# Patient Record
Sex: Male | Born: 1940
Health system: Southern US, Community
[De-identification: ages and names within clinical notes are randomized; demographics above are authoritative.]

## PROBLEM LIST (undated history)

## (undated) DIAGNOSIS — R0600 Dyspnea, unspecified: Secondary | ICD-10-CM

## (undated) DIAGNOSIS — I2510 Atherosclerotic heart disease of native coronary artery without angina pectoris: Secondary | ICD-10-CM

## (undated) DIAGNOSIS — I639 Cerebral infarction, unspecified: Secondary | ICD-10-CM

## (undated) DIAGNOSIS — G4700 Insomnia, unspecified: Secondary | ICD-10-CM

## (undated) DIAGNOSIS — F0390 Unspecified dementia without behavioral disturbance: Secondary | ICD-10-CM

## (undated) DIAGNOSIS — F1010 Alcohol abuse, uncomplicated: Secondary | ICD-10-CM

## (undated) DIAGNOSIS — B019 Varicella without complication: Secondary | ICD-10-CM

## (undated) DIAGNOSIS — I10 Essential (primary) hypertension: Secondary | ICD-10-CM

## (undated) DIAGNOSIS — I480 Paroxysmal atrial fibrillation: Secondary | ICD-10-CM

## (undated) DIAGNOSIS — K922 Gastrointestinal hemorrhage, unspecified: Secondary | ICD-10-CM

## (undated) DIAGNOSIS — D649 Anemia, unspecified: Secondary | ICD-10-CM

## (undated) DIAGNOSIS — I509 Heart failure, unspecified: Secondary | ICD-10-CM

## (undated) DIAGNOSIS — J189 Pneumonia, unspecified organism: Secondary | ICD-10-CM

## (undated) DIAGNOSIS — N189 Chronic kidney disease, unspecified: Secondary | ICD-10-CM

## (undated) DIAGNOSIS — N186 End stage renal disease: Secondary | ICD-10-CM

## (undated) DIAGNOSIS — M1990 Unspecified osteoarthritis, unspecified site: Secondary | ICD-10-CM

## (undated) HISTORY — DX: Cerebral infarction, unspecified: I63.9

## (undated) HISTORY — DX: Alcohol abuse, uncomplicated: F10.10

## (undated) HISTORY — DX: Chronic kidney disease, unspecified: N18.9

## (undated) HISTORY — DX: Essential (primary) hypertension: I10

## (undated) HISTORY — DX: Varicella without complication: B01.9

## (undated) HISTORY — DX: Insomnia, unspecified: G47.00

## (undated) HISTORY — DX: Unspecified osteoarthritis, unspecified site: M19.90

---

## 2019-09-23 DIAGNOSIS — F1010 Alcohol abuse, uncomplicated: Secondary | ICD-10-CM

## 2019-09-23 HISTORY — DX: Alcohol abuse, uncomplicated: F10.10

## 2019-12-15 ENCOUNTER — Encounter: Payer: Self-pay | Admitting: Internal Medicine

## 2019-12-15 ENCOUNTER — Ambulatory Visit (INDEPENDENT_AMBULATORY_CARE_PROVIDER_SITE_OTHER): Payer: Medicare Other | Admitting: Internal Medicine

## 2019-12-15 ENCOUNTER — Other Ambulatory Visit: Payer: Self-pay

## 2019-12-15 VITALS — BP 110/78 | HR 82 | Temp 97.6°F | Ht 68.0 in | Wt 144.7 lb

## 2019-12-15 DIAGNOSIS — I10 Essential (primary) hypertension: Secondary | ICD-10-CM | POA: Insufficient documentation

## 2019-12-15 DIAGNOSIS — N186 End stage renal disease: Secondary | ICD-10-CM

## 2019-12-15 DIAGNOSIS — G4700 Insomnia, unspecified: Secondary | ICD-10-CM

## 2019-12-15 DIAGNOSIS — Z992 Dependence on renal dialysis: Secondary | ICD-10-CM

## 2019-12-15 DIAGNOSIS — Z720 Tobacco use: Secondary | ICD-10-CM

## 2019-12-15 MED ORDER — TRAZODONE HCL 50 MG PO TABS: ORAL_TABLET | ORAL | 1 refills | Status: DC

## 2019-12-15 NOTE — Progress Notes (Signed)
New Patient Office Visit     This visit occurred during the SARS-CoV-2 public health emergency.  Safety protocols were in place, including screening questions prior to the visit, additional usage of staff PPE, and extensive cleaning of exam room while observing appropriate contact time as indicated for disinfecting solutions.    CC/Reason for Visit: Establish care, discuss chronic conditions, medication refills Previous PCP: In [facility] Last Visit: Unknown  HPI: Anthony [name] is a 79 y.o. male who is coming in today for the above mentioned reasons. Past Medical History is significant for: End-stage renal disease on hemodialysis on a Monday, Wednesday, Friday schedule, history of hypertension that has been well controlled as well as a prior CVA in 1992 without residual deficits.  He moved to [place] 2 months ago to be closer to family.  He is already established with the dialysis center recommended a PCP.  He also has a history of insomnia for which she takes tramadol.  He has not had a physical in recent times.  He has no acute complaints today.  He received his first Covid vaccine.  He smokes 1 pack per 3 days.  Denies alcohol use.  He has no known drug allergies.   Past Medical/Surgical History: Past Medical History:  Diagnosis Date  . Alcohol abuse   . Arthritis   . Chicken pox   . Chronic kidney disease   . Hypertension   . Insomnia   . Stroke [facility])     History reviewed. No pertinent surgical history.  Social History:  reports that he has been smoking. He has never used smokeless tobacco. He reports previous alcohol use. He reports previous drug use.  Allergies: Allergies  Allergen Reactions  . Other Rash    Family History:  History reviewed. No pertinent family history. No history of heart disease, cancer, stroke that he is aware of.  Current Outpatient Medications:  .  albuterol (PROVENTIL HFA) 108 (90 Base) MCG/ACT inhaler, Inhale into the  lungs., Disp: , Rfl:  .  albuterol (VENTOLIN HFA) 108 (90 Base) MCG/ACT inhaler, , Disp: , Rfl:  .  hydrALAZINE (APRESOLINE) 100 MG tablet, Take 100 mg by mouth 3 (three) times daily., Disp: , Rfl:  .  OVER THE COUNTER MEDICATION, OMEN XL - for the whole body, Disp: , Rfl:  .  traZODone (DESYREL) 50 MG tablet, Take one to two tabs at bedtime as needed, Disp: 45 tablet, Rfl: 1  Review of Systems:  Constitutional: Denies fever, chills, diaphoresis, appetite change and fatigue.  HEENT: Denies photophobia, eye pain, redness, hearing loss, ear pain, congestion, sore throat, rhinorrhea, sneezing, mouth sores, trouble swallowing, neck pain, neck stiffness and tinnitus.   Respiratory: Denies SOB, DOE, cough, chest tightness,  and wheezing.   Cardiovascular: Denies chest pain, palpitations and leg swelling.  Gastrointestinal: Denies nausea, vomiting, abdominal pain, diarrhea, constipation, blood in stool and abdominal distention.  Genitourinary: Denies dysuria, urgency, frequency, hematuria, flank pain and difficulty urinating.  Endocrine: Denies: hot or cold intolerance, sweats, changes in hair or nails, polyuria, polydipsia. Musculoskeletal: Denies myalgias, back pain, joint swelling, arthralgias and gait problem.  Skin: Denies pallor, rash and wound.  Neurological: Denies dizziness, seizures, syncope, weakness, light-headedness, numbness and headaches.  Hematological: Denies adenopathy. Easy bruising, personal or family bleeding history  Psychiatric/Behavioral: Denies suicidal ideation, mood changes, confusion, nervousness, sleep disturbance and agitation    Physical Exam: Vitals:   12/15/19 1416  BP: 110/78  Pulse: 82  Temp: 97.6 F (36.4 C)  TempSrc: Temporal  SpO2: 97%  Weight: 144 lb 11.2 oz (65.6 kg)  Height: 5\' 8"  (1.727 m)   Body mass index is 22 kg/m.  Constitutional: NAD, calm, comfortable Eyes: PERRL, lids and conjunctivae normal ENMT: Mucous membranes are  moist. Respiratory: clear to auscultation bilaterally, no wheezing, no crackles. Normal respiratory effort. No accessory muscle use.  Cardiovascular: Regular rate and rhythm, no murmurs / rubs / gallops. No extremity edema.  Neurologic: Grossly intact and nonfocal  Psychiatric: Normal judgment and insight. Alert and oriented x 3. Normal mood.    Impression and Plan:  Tobacco abuse -Not interested in quitting at this time.  Insomnia, unspecified type  -Refill trazodone 50 mg that he has been taking use at bedtime.  ESRD (end stage renal disease) on dialysis [facility]) -On Monday, Wednesday, Friday schedule.  Essential hypertension -Well-controlled.    Patient Instructions  -Nice seeing you today!!  -See you back in 3 months for your physical. Please come in fasting that day.     Lelon Frohlich, MD [facility]

## 2019-12-15 NOTE — Patient Instructions (Signed)
-  Nice seeing you today!!  -See you back in 3 months for your physical. Please come in fasting that day.

## 2019-12-21 ENCOUNTER — Other Ambulatory Visit: Payer: Self-pay

## 2019-12-21 ENCOUNTER — Emergency Department (HOSPITAL_COMMUNITY)
Admission: EM | Admit: 2019-12-21 | Discharge: 2019-12-21 | Disposition: A | Discharge status: Home / Self Care | Payer: Medicare Other | Attending: Emergency Medicine | Admitting: Emergency Medicine

## 2019-12-21 ENCOUNTER — Emergency Department (HOSPITAL_COMMUNITY): Payer: Medicare Other

## 2019-12-21 ENCOUNTER — Encounter (HOSPITAL_COMMUNITY): Payer: Self-pay

## 2019-12-21 DIAGNOSIS — S199XXA Unspecified injury of neck, initial encounter: Secondary | ICD-10-CM | POA: Diagnosis present

## 2019-12-21 DIAGNOSIS — Z992 Dependence on renal dialysis: Secondary | ICD-10-CM | POA: Diagnosis not present

## 2019-12-21 DIAGNOSIS — F17200 Nicotine dependence, unspecified, uncomplicated: Secondary | ICD-10-CM | POA: Diagnosis not present

## 2019-12-21 DIAGNOSIS — Z8673 Personal history of transient ischemic attack (TIA), and cerebral infarction without residual deficits: Secondary | ICD-10-CM | POA: Diagnosis not present

## 2019-12-21 DIAGNOSIS — S161XXA Strain of muscle, fascia and tendon at neck level, initial encounter: Secondary | ICD-10-CM | POA: Insufficient documentation

## 2019-12-21 DIAGNOSIS — Y92410 Unspecified street and highway as the place of occurrence of the external cause: Secondary | ICD-10-CM | POA: Diagnosis not present

## 2019-12-21 DIAGNOSIS — Y939 Activity, unspecified: Secondary | ICD-10-CM | POA: Diagnosis not present

## 2019-12-21 DIAGNOSIS — N186 End stage renal disease: Secondary | ICD-10-CM | POA: Diagnosis not present

## 2019-12-21 DIAGNOSIS — Y999 Unspecified external cause status: Secondary | ICD-10-CM | POA: Diagnosis not present

## 2019-12-21 DIAGNOSIS — I120 Hypertensive chronic kidney disease with stage 5 chronic kidney disease or end stage renal disease: Secondary | ICD-10-CM | POA: Insufficient documentation

## 2019-12-21 MED ORDER — OXYCODONE-ACETAMINOPHEN 5-325 MG PO TABS
1.0000 | ORAL_TABLET | Freq: Once | ORAL | Status: AC
  Administered 2019-12-21: 16:00:00 1 via ORAL
  Filled 2019-12-21: qty 1

## 2019-12-21 MED ORDER — METHOCARBAMOL 500 MG PO TABS
500.0000 mg | ORAL_TABLET | Freq: Once | ORAL | Status: AC
  Administered 2019-12-21: 500 mg via ORAL
  Filled 2019-12-21: qty 1

## 2019-12-21 MED ORDER — METHOCARBAMOL 500 MG PO TABS: 500.0000 mg | ORAL_TABLET | Freq: Three times a day (TID) | ORAL | 0 refills | Status: DC | PRN
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
## 2019-12-21 NOTE — Discharge Instructions (Signed)
Please schedule a follow-up plan with your primary doctor for recheck regarding the symptoms returned today.  Can take muscle relaxer as needed for pain control as well as Tylenol.  Return to ER if you develop chest pain, difficulty breathing, numbness, weakness, headaches or other new concern.

## 2020-01-26 ENCOUNTER — Other Ambulatory Visit: Payer: Self-pay | Admitting: Internal Medicine

## 2020-01-26 DIAGNOSIS — G4700 Insomnia, unspecified: Secondary | ICD-10-CM

## 2020-02-14 ENCOUNTER — Ambulatory Visit
Admission: RE | Admit: 2020-02-14 | Discharge: 2020-02-14 | Disposition: A | Discharge status: Home / Self Care | Payer: Medicare Other | Source: Ambulatory Visit | Attending: Nephrology | Admitting: Nephrology

## 2020-02-14 ENCOUNTER — Other Ambulatory Visit: Payer: Self-pay | Admitting: Nephrology

## 2020-02-14 DIAGNOSIS — N186 End stage renal disease: Secondary | ICD-10-CM

## 2020-02-14 DIAGNOSIS — R0600 Dyspnea, unspecified: Secondary | ICD-10-CM

## 2020-02-21 ENCOUNTER — Telehealth: Payer: Self-pay | Admitting: Internal Medicine

## 2020-02-29 ENCOUNTER — Encounter (HOSPITAL_COMMUNITY): Payer: Self-pay | Admitting: Pharmacy Technician

## 2020-02-29 ENCOUNTER — Inpatient Hospital Stay (HOSPITAL_COMMUNITY)
Admission: EM | Admit: 2020-02-29 | Discharge: 2020-03-02 | DRG: 190 | Disposition: A | Discharge status: Home / Self Care | Payer: Medicare Other | Source: Other Acute Inpatient Hospital | Attending: Family Medicine | Admitting: Family Medicine

## 2020-02-29 ENCOUNTER — Other Ambulatory Visit: Payer: Self-pay

## 2020-02-29 ENCOUNTER — Emergency Department (HOSPITAL_COMMUNITY): Payer: Medicare Other

## 2020-02-29 DIAGNOSIS — K219 Gastro-esophageal reflux disease without esophagitis: Secondary | ICD-10-CM | POA: Diagnosis present

## 2020-02-29 DIAGNOSIS — D638 Anemia in other chronic diseases classified elsewhere: Secondary | ICD-10-CM | POA: Diagnosis present

## 2020-02-29 DIAGNOSIS — N2581 Secondary hyperparathyroidism of renal origin: Secondary | ICD-10-CM | POA: Diagnosis present

## 2020-02-29 DIAGNOSIS — F17210 Nicotine dependence, cigarettes, uncomplicated: Secondary | ICD-10-CM | POA: Diagnosis present

## 2020-02-29 DIAGNOSIS — I132 Hypertensive heart and chronic kidney disease with heart failure and with stage 5 chronic kidney disease, or end stage renal disease: Secondary | ICD-10-CM | POA: Diagnosis present

## 2020-02-29 DIAGNOSIS — Z20822 Contact with and (suspected) exposure to covid-19: Secondary | ICD-10-CM | POA: Diagnosis present

## 2020-02-29 DIAGNOSIS — E8889 Other specified metabolic disorders: Secondary | ICD-10-CM | POA: Diagnosis present

## 2020-02-29 DIAGNOSIS — N186 End stage renal disease: Secondary | ICD-10-CM | POA: Diagnosis present

## 2020-02-29 DIAGNOSIS — Z8249 Family history of ischemic heart disease and other diseases of the circulatory system: Secondary | ICD-10-CM

## 2020-02-29 DIAGNOSIS — J209 Acute bronchitis, unspecified: Secondary | ICD-10-CM | POA: Diagnosis present

## 2020-02-29 DIAGNOSIS — R079 Chest pain, unspecified: Secondary | ICD-10-CM | POA: Diagnosis not present

## 2020-02-29 DIAGNOSIS — I480 Paroxysmal atrial fibrillation: Secondary | ICD-10-CM | POA: Diagnosis present

## 2020-02-29 DIAGNOSIS — Z8673 Personal history of transient ischemic attack (TIA), and cerebral infarction without residual deficits: Secondary | ICD-10-CM

## 2020-02-29 DIAGNOSIS — F17200 Nicotine dependence, unspecified, uncomplicated: Secondary | ICD-10-CM | POA: Diagnosis present

## 2020-02-29 DIAGNOSIS — R778 Other specified abnormalities of plasma proteins: Secondary | ICD-10-CM

## 2020-02-29 DIAGNOSIS — D72819 Decreased white blood cell count, unspecified: Secondary | ICD-10-CM | POA: Diagnosis present

## 2020-02-29 DIAGNOSIS — D649 Anemia, unspecified: Secondary | ICD-10-CM | POA: Diagnosis present

## 2020-02-29 DIAGNOSIS — J440 Chronic obstructive pulmonary disease with acute lower respiratory infection: Secondary | ICD-10-CM | POA: Diagnosis present

## 2020-02-29 DIAGNOSIS — Z992 Dependence on renal dialysis: Secondary | ICD-10-CM

## 2020-02-29 DIAGNOSIS — Z79899 Other long term (current) drug therapy: Secondary | ICD-10-CM | POA: Diagnosis not present

## 2020-02-29 DIAGNOSIS — I248 Other forms of acute ischemic heart disease: Secondary | ICD-10-CM | POA: Diagnosis present

## 2020-02-29 DIAGNOSIS — J441 Chronic obstructive pulmonary disease with (acute) exacerbation: Principal | ICD-10-CM | POA: Diagnosis present

## 2020-02-29 DIAGNOSIS — I5032 Chronic diastolic (congestive) heart failure: Secondary | ICD-10-CM | POA: Diagnosis present

## 2020-02-29 DIAGNOSIS — F1011 Alcohol abuse, in remission: Secondary | ICD-10-CM | POA: Diagnosis present

## 2020-02-29 DIAGNOSIS — I160 Hypertensive urgency: Secondary | ICD-10-CM | POA: Diagnosis present

## 2020-02-29 DIAGNOSIS — J9601 Acute respiratory failure with hypoxia: Secondary | ICD-10-CM | POA: Diagnosis present

## 2020-02-29 DIAGNOSIS — R0789 Other chest pain: Secondary | ICD-10-CM

## 2020-02-29 DIAGNOSIS — D631 Anemia in chronic kidney disease: Secondary | ICD-10-CM | POA: Diagnosis present

## 2020-02-29 HISTORY — DX: Gastrointestinal hemorrhage, unspecified: K92.2

## 2020-02-29 HISTORY — DX: Dependence on renal dialysis: N18.6

## 2020-02-29 HISTORY — DX: Heart failure, unspecified: I50.9

## 2020-02-29 LAB — CBC WITH DIFFERENTIAL/PLATELET
Abs Immature Granulocytes: 0.02 10*3/uL (ref 0.00–0.07)
Basophils Absolute: 0 10*3/uL (ref 0.0–0.1)
Basophils Relative: 1 %
Eosinophils Absolute: 0 10*3/uL (ref 0.0–0.5)
Eosinophils Relative: 0 %
HCT: 28.1 % — ABNORMAL LOW (ref 39.0–52.0)
Hemoglobin: 8.3 g/dL — ABNORMAL LOW (ref 13.0–17.0)
Immature Granulocytes: 0 %
Lymphocytes Relative: 19 %
Lymphs Abs: 0.9 10*3/uL (ref 0.7–4.0)
MCH: 27.7 pg (ref 26.0–34.0)
MCHC: 29.5 g/dL — ABNORMAL LOW (ref 30.0–36.0)
MCV: 93.7 fL (ref 80.0–100.0)
Monocytes Absolute: 0.4 10*3/uL (ref 0.1–1.0)
Monocytes Relative: 9 %
Neutro Abs: 3.4 10*3/uL (ref 1.7–7.7)
Neutrophils Relative %: 71 %
Platelets: 205 10*3/uL (ref 150–400)
RBC: 3 MIL/uL — ABNORMAL LOW (ref 4.22–5.81)
RDW: 17.2 % — ABNORMAL HIGH (ref 11.5–15.5)
WBC: 4.8 10*3/uL (ref 4.0–10.5)
nRBC: 0 % (ref 0.0–0.2)

## 2020-02-29 LAB — COMPREHENSIVE METABOLIC PANEL
ALT: 20 U/L (ref 0–44)
AST: 25 U/L (ref 15–41)
Albumin: 3.5 g/dL (ref 3.5–5.0)
Alkaline Phosphatase: 74 U/L (ref 38–126)
Anion gap: 16 — ABNORMAL HIGH (ref 5–15)
BUN: 26 mg/dL — ABNORMAL HIGH (ref 8–23)
CO2: 25 mmol/L (ref 22–32)
Calcium: 8.3 mg/dL — ABNORMAL LOW (ref 8.9–10.3)
Chloride: 99 mmol/L (ref 98–111)
Creatinine, Ser: 7.92 mg/dL — ABNORMAL HIGH (ref 0.61–1.24)
GFR calc Af Amer: 7 mL/min — ABNORMAL LOW (ref >60)
GFR calc non Af Amer: 6 mL/min — ABNORMAL LOW (ref >60)
Glucose, Bld: 101 mg/dL — ABNORMAL HIGH (ref 70–99)
Potassium: 3.7 mmol/L (ref 3.5–5.1)
Sodium: 140 mmol/L (ref 135–145)
Total Bilirubin: 0.8 mg/dL (ref 0.3–1.2)
Total Protein: 7.3 g/dL (ref 6.5–8.1)

## 2020-02-29 LAB — CREATININE, SERUM
Creatinine, Ser: 8.56 mg/dL — ABNORMAL HIGH (ref 0.61–1.24)
GFR calc Af Amer: 6 mL/min — ABNORMAL LOW (ref >60)
GFR calc non Af Amer: 5 mL/min — ABNORMAL LOW (ref >60)

## 2020-02-29 LAB — PHOSPHORUS: Phosphorus: 4.1 mg/dL (ref 2.5–4.6)

## 2020-02-29 LAB — TROPONIN I (HIGH SENSITIVITY)
Troponin I (High Sensitivity): 326 ng/L (ref <18)
Troponin I (High Sensitivity): 310 ng/L (ref <18)

## 2020-02-29 LAB — BRAIN NATRIURETIC PEPTIDE: B Natriuretic Peptide: >4500 pg/mL — ABNORMAL HIGH (ref 0.0–100.0)

## 2020-02-29 LAB — SARS CORONAVIRUS 2 BY RT PCR (HOSPITAL ORDER, PERFORMED IN ~~LOC~~ HOSPITAL LAB): SARS Coronavirus 2: NEGATIVE

## 2020-02-29 LAB — MAGNESIUM: Magnesium: 2.2 mg/dL (ref 1.7–2.4)

## 2020-02-29 MED ORDER — METHYLPREDNISOLONE SODIUM SUCC 125 MG IJ SOLR
125.0000 mg | Freq: Once | INTRAMUSCULAR | Status: AC
  Administered 2020-02-29: 125 mg via INTRAVENOUS
  Filled 2020-02-29: qty 2

## 2020-02-29 MED ORDER — ALBUTEROL SULFATE HFA 108 (90 BASE) MCG/ACT IN AERS
INHALATION_SPRAY | RESPIRATORY_TRACT | Status: AC
  Filled 2020-02-29: qty 6.7

## 2020-02-29 MED ORDER — PANTOPRAZOLE SODIUM 40 MG PO TBEC: 40.0000 mg | DELAYED_RELEASE_TABLET | Freq: Every day | ORAL | Status: DC

## 2020-02-29 MED ORDER — IPRATROPIUM-ALBUTEROL 0.5-2.5 (3) MG/3ML IN SOLN
3.0000 mL | RESPIRATORY_TRACT | Status: DC | PRN
  Administered 2020-02-29 – 2020-03-01 (×2): 3 mL via RESPIRATORY_TRACT
  Filled 2020-02-29 (×2): qty 3

## 2020-02-29 MED ORDER — ASPIRIN 81 MG PO CHEW
324.0000 mg | CHEWABLE_TABLET | Freq: Once | ORAL | Status: AC
  Administered 2020-02-29: 324 mg via ORAL
  Filled 2020-02-29: qty 4

## 2020-02-29 MED ORDER — ALBUTEROL SULFATE HFA 108 (90 BASE) MCG/ACT IN AERS
8.0000 | INHALATION_SPRAY | Freq: Once | RESPIRATORY_TRACT | Status: AC
  Administered 2020-02-29: 8 via RESPIRATORY_TRACT

## 2020-02-29 MED ORDER — HEPARIN SODIUM (PORCINE) 5000 UNIT/ML IJ SOLN
5000.0000 [IU] | Freq: Three times a day (TID) | INTRAMUSCULAR | Status: DC
  Administered 2020-03-01: 5000 [IU] via SUBCUTANEOUS
  Filled 2020-02-29: qty 1

## 2020-02-29 MED ORDER — CALCIUM ACETATE (PHOS BINDER) 667 MG PO CAPS
667.0000 mg | ORAL_CAPSULE | Freq: Three times a day (TID) | ORAL | Status: DC
  Administered 2020-03-01: 667 mg via ORAL
  Filled 2020-02-29 (×2): qty 1

## 2020-02-29 MED ORDER — LABETALOL HCL 5 MG/ML IV SOLN
10.0000 mg | INTRAVENOUS | Status: DC | PRN
  Administered 2020-02-29 – 2020-03-01 (×2): 10 mg via INTRAVENOUS
  Filled 2020-02-29 (×3): qty 4

## 2020-02-29 MED ORDER — TRAZODONE HCL 50 MG PO TABS
50.0000 mg | ORAL_TABLET | Freq: Every day | ORAL | Status: DC
  Administered 2020-02-29 – 2020-03-01 (×2): 50 mg via ORAL
  Filled 2020-02-29 (×2): qty 1

## 2020-02-29 MED ORDER — ALBUTEROL SULFATE (2.5 MG/3ML) 0.083% IN NEBU: 2.5000 mg | INHALATION_SOLUTION | RESPIRATORY_TRACT | Status: DC

## 2020-02-29 MED ORDER — MORPHINE SULFATE (PF) 2 MG/ML IV SOLN
2.0000 mg | INTRAVENOUS | Status: DC | PRN
  Filled 2020-02-29: qty 1

## 2020-02-29 MED ORDER — HYDRALAZINE HCL 25 MG PO TABS
100.0000 mg | ORAL_TABLET | Freq: Three times a day (TID) | ORAL | Status: DC
  Administered 2020-02-29 – 2020-03-02 (×5): 100 mg via ORAL
  Filled 2020-02-29 (×6): qty 4

## 2020-02-29 MED ORDER — ACETAMINOPHEN 650 MG RE SUPP: 650.0000 mg | Freq: Four times a day (QID) | RECTAL | Status: DC | PRN

## 2020-02-29 MED ORDER — ONDANSETRON HCL 4 MG PO TABS: 4.0000 mg | ORAL_TABLET | Freq: Four times a day (QID) | ORAL | Status: DC | PRN

## 2020-02-29 MED ORDER — IPRATROPIUM-ALBUTEROL 0.5-2.5 (3) MG/3ML IN SOLN
3.0000 mL | RESPIRATORY_TRACT | Status: DC
  Administered 2020-02-29: 3 mL via RESPIRATORY_TRACT
  Filled 2020-02-29: qty 3

## 2020-02-29 MED ORDER — ONDANSETRON HCL 4 MG/2ML IJ SOLN
4.0000 mg | Freq: Four times a day (QID) | INTRAMUSCULAR | Status: DC | PRN
  Filled 2020-02-29: qty 2

## 2020-02-29 MED ORDER — NITROGLYCERIN 0.4 MG SL SUBL: 0.4000 mg | SUBLINGUAL_TABLET | SUBLINGUAL | Status: DC | PRN

## 2020-02-29 MED ORDER — METHYLPREDNISOLONE SODIUM SUCC 125 MG IJ SOLR
60.0000 mg | Freq: Two times a day (BID) | INTRAMUSCULAR | Status: DC
  Administered 2020-03-01: 60 mg via INTRAVENOUS
  Filled 2020-02-29: qty 2

## 2020-02-29 MED ORDER — ACETAMINOPHEN 325 MG PO TABS: 650.0000 mg | ORAL_TABLET | Freq: Four times a day (QID) | ORAL | Status: DC | PRN

## 2020-02-29 NOTE — ED Notes (Signed)
Pt provided w/ water

## 2020-02-29 NOTE — ED Triage Notes (Signed)
Pt bib ems from dialysis after completing 1/4 treatment when he developed CP. Pt with noticeable trouble breathing. Given 5mg  albuterol, 125 solu medrol and placed on Cpap 190/110 NSR 80
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
## 2020-02-29 NOTE — ED Notes (Signed)
Attempted to call report; nurse in a patient's room and will call me back.

## 2020-02-29 NOTE — ED Provider Notes (Signed)
[facility] EMERGENCY DEPARTMENT Provider Note   CSN: [id number] Arrival date & time: 02/29/20  1400     History Chief Complaint  Patient presents with  . Chest Pain  . Shortness of Breath    Anthony [name] is a 79 y.o. male.  HPI 79 year old male presents from dialysis with shortness of breath.  History of by EMS and patient.  Patient tells me he is chronically short of breath when he walks but this morning it was worse.  He is also having a cough that is new this morning.  Went to dialysis and had chest pain after starting dialysis so he was sent here.  Chest pain seems to be gone.  He describes the feeling he had as "medium". EMS placed him on CPAP and given albuterol and Solu-Medrol.  He states he is feeling much better but not back to his baseline.  He denies any known lung history such as COPD.  He does smoke.  No leg swelling or previous missed dialysis. No fevers.   Past Medical History:  Diagnosis Date  . Alcohol abuse   . Arthritis   . Chicken pox   . Chronic kidney disease   . Hypertension   . Insomnia   . Stroke [facility])     Patient Active Problem List   Diagnosis Date Noted  . ESRD (end stage renal disease) on dialysis ([place]) 12/15/2019  . HTN (hypertension) 12/15/2019  . Insomnia     History reviewed. No pertinent surgical history.     No family history on file.  Social History   Tobacco Use  . Smoking status: Current Every Day Smoker  . Smokeless tobacco: Never Used  Substance Use Topics  . Alcohol use: Not Currently  . Drug use: Not Currently    Home Medications Prior to Admission medications   Medication Sig Start Date End Date Taking? Authorizing Provider  albuterol (PROVENTIL HFA) 108 (90 Base) MCG/ACT inhaler Inhale into the lungs. 10/15/17   [provider]  albuterol (VENTOLIN HFA) 108 (90 Base) MCG/ACT inhaler  09/02/19   [provider]  hydrALAZINE (APRESOLINE) 100 MG tablet Take 100 mg by mouth 3  (three) times daily. 07/31/19   [provider]  methocarbamol (ROBAXIN) 500 MG tablet Take 1 tablet (500 mg total) by mouth every 8 (eight) hours as needed for muscle spasms. 12/21/19   Lucrezia Starch, MD  OVER THE COUNTER MEDICATION OMEN XL - for the whole body    [provider]  traZODone (DESYREL) 50 MG tablet TAKE 1 TO 2 TABLETS AT BEDTIME AS NEEDED 01/26/20   Isaac Bliss, Rayford Halsted, MD    Allergies    Other  Review of Systems   Review of Systems  Constitutional: Negative for fever.  Respiratory: Positive for cough and shortness of breath.   Cardiovascular: Positive for chest pain. Negative for leg swelling.  All other systems reviewed and are negative.   Physical Exam Updated Vital Signs BP (!) 184/93   Pulse 94   Temp (!) 97.3 F (36.3 C) (Oral)   Resp 17   Ht 5\' 8"  (1.727 m)   Wt 66 kg   SpO2 94%   BMI 22.12 kg/m   Physical Exam Vitals and nursing note reviewed.  Constitutional:      General: He is not in acute distress.    Appearance: He is well-developed. He is not ill-appearing or diaphoretic.  HENT:     Head: Normocephalic and atraumatic.  Right Ear: External ear normal.     Left Ear: External ear normal.     Nose: Nose normal.  Eyes:     General:        Right eye: No discharge.        Left eye: No discharge.  Cardiovascular:     Rate and Rhythm: Regular rhythm. Tachycardia present.     Heart sounds: Normal heart sounds.  Pulmonary:     Effort: Pulmonary effort is normal. Tachypnea present. No accessory muscle usage.     Breath sounds: Wheezing (diffuse, expiratory) present.  Abdominal:     Palpations: Abdomen is soft.     Tenderness: There is no abdominal tenderness.  Musculoskeletal:     Cervical back: Neck supple.     Right lower leg: No edema.     Left lower leg: No edema.  Skin:    General: Skin is warm and dry.  Neurological:     Mental Status: He is alert.  Psychiatric:        Mood and Affect: Mood is not  anxious.     ED Results / Procedures / Treatments   Labs (all labs ordered are listed, but only abnormal results are displayed) Labs Reviewed  COMPREHENSIVE METABOLIC PANEL - Abnormal; Notable for the following components:      Result Value   Glucose, Bld 101 (*)    BUN 26 (*)    Creatinine, Ser 7.92 (*)    Calcium 8.3 (*)    GFR calc non Af Amer 6 (*)    GFR calc Af Amer 7 (*)    Anion gap 16 (*)    All other components within normal limits  CBC WITH DIFFERENTIAL/PLATELET - Abnormal; Notable for the following components:   RBC 3.00 (*)    Hemoglobin 8.3 (*)    HCT 28.1 (*)    MCHC 29.5 (*)    RDW 17.2 (*)    All other components within normal limits  TROPONIN I (HIGH SENSITIVITY) - Abnormal; Notable for the following components:   Troponin I (High Sensitivity) 326 (*)    All other components within normal limits  SARS CORONAVIRUS 2 BY RT PCR [facility] ORDER, [place] LAB)  BRAIN NATRIURETIC PEPTIDE    EKG EKG Interpretation  Date/Time:  Wednesday February 29 2020 14:04:53 EDT Ventricular Rate:  84 PR Interval:    QRS Duration: 95 QT Interval:  414 QTC Calculation: 490 R Axis:   -35 Text Interpretation: Sinus rhythm Atrial premature complexes LVH with secondary repolarization abnormality Anterior infarct, old No old tracing to compare Confirmed by Sherwood Gambler [phone]) on 02/29/2020 2:19:55 PM   Radiology DG Chest Portable 1 View  Result Date: 02/29/2020 CLINICAL DATA:  Cough and shortness of breath. EXAM: PORTABLE CHEST 1 VIEW COMPARISON:  02/14/2020 FINDINGS: Borderline cardiomegaly with unchanged mediastinal contours. Aortic atherosclerosis. No pulmonary edema. Mild hyperinflation which appears similar to prior. No focal airspace disease, pleural effusion, or pneumothorax. No acute osseous abnormalities are seen. IMPRESSION: 1. No acute abnormality. 2. Borderline cardiomegaly, likely accentuated by portable technique. 3. Hyperinflation, unchanged  from prior. Electronically Signed   By: Keith Rake M.D.   On: 02/29/2020 14:40    Procedures .Critical Care Performed by: Sherwood Gambler, MD Authorized by: Sherwood Gambler, MD   Critical care provider statement:    Critical care time (minutes):  30   Critical care time was exclusive of:  Separately billable procedures and treating other patients   Critical care  was necessary to treat or prevent imminent or life-threatening deterioration of the following conditions:  Cardiac failure and respiratory failure   Critical care was time spent personally by me on the following activities:  Discussions with consultants, evaluation of patient's response to treatment, examination of patient, ordering and performing treatments and interventions, ordering and review of laboratory studies, ordering and review of radiographic studies, pulse oximetry, re-evaluation of patient's condition, obtaining history from patient or surrogate and review of old charts   (including critical care time)  Medications Ordered in ED Medications  albuterol (VENTOLIN HFA) 108 (90 Base) MCG/ACT inhaler 8 puff (8 puffs Inhalation Given 02/29/20 1412)    ED Course  I have reviewed the triage vital signs and the nursing notes.  Pertinent labs & imaging results that were available during my care of the patient were reviewed by me and considered in my medical decision making (see chart for details).    MDM Rules/Calculators/A&P                      Patient was given albuterol and is now feeling better.  His chest pain is atypical though he is also not a very good historian at describing what it was about.  He is noted to be pretty hypertensive.  His labs show chronic kidney disease but also a troponin of 326.  No old to compare to.  Probably a combination of hypertension and kidney failure but he probably needs further work-up given the acute chest pain today.  I discussed with cardiology who will consult.  Will give  aspirin.  Will need admission to the hospitalist service. Final Clinical Impression(s) / ED Diagnoses Final diagnoses:  Acute bronchitis, unspecified organism  Elevated troponin    Rx / DC Orders ED Discharge Orders    None       Sherwood Gambler, MD 02/29/20 1600

## 2020-02-29 NOTE — ED Notes (Signed)
Pt called out asking for oxygen, states he is having difficulty breathing. Pt oxygen saturation 96% with good pleth. Pt placed on 2L [place] for increased WOB.

## 2020-02-29 NOTE — ED Notes (Signed)
Contacted admitting provider about a diet for pt and the clamp on pt HD access. Awaiting orders.

## 2020-02-29 NOTE — H&P (Addendum)
History and Physical    Anthony [name] IEP:[id number] DOB: 02/27/1941 DOA: 02/29/2020  PCP: Isaac Bliss, Rayford Halsted, MD  Patient coming from: Dialysis  I have personally briefly reviewed patient's old medical records in [place]  Chief Complaint: Chest pain and shortness of breath  HPI: Anthony [name] is a 79 y.o. male with medical history significant of ESRD on hemodialysis (MWF), hypertension, CVA with no residual weakness, anemia of chronic disease, tobacco abuse presents to emergency department due to chest pain and shortness of breath since 2 to 3 weeks.  Patient reports epigastric/midsternal chest pain since 2 to 3 weeks.  This morning his symptoms started while he was receiving dialysis.  EMS was called and brought patient to the emergency department for further evaluation and management.  He reports his pain is epigastric/midsternal, 8 out of 10, nonradiating, associated with orthopnea, PND, productive cough with greenish sputum.  Denies association with diaphoresis, nausea, vomiting, palpitation, leg swelling, abdominal pain, urinary symptoms, fever, chills, recent sick contact.  He has been taking over-the-counter NSAID for chest pain.  He tells me that he never had such type of chest pain before.  No melena, hematemesis, urinary symptoms.  He tells me that he had a colonoscopy a year ago which was negative?Marland Kitchen He continues to smoke 1 pack of cigarette/3 days, no history of alcohol, illicit drug use.  He lives with his wife at home.  He is compliant with his home medication as well as dialysis appointment.  He recently moved to [place] 2 to 3 months ago.  ED Course: Upon arrival to ED: Patient blood pressure elevated, afebrile with no leukocytosis.  BNP: > 4500, troponin: 326, CBC shows H&H of 8.3/28.1, COVID-19 negative, chest x-ray shows borderline cardiomegaly.  EDP consulted cardiology who recommended nuclear stress test.  [facility] hospitalist consulted for admission for chest  pain and shortness of breath.  Review of Systems: As per HPI otherwise negative.    Past Medical History:  Diagnosis Date  . Alcohol abuse   . Arthritis   . Chicken pox   . Chronic kidney disease   . Hypertension   . Insomnia   . Stroke [facility])     History reviewed. No pertinent surgical history.   reports that he has been smoking. He has never used smokeless tobacco. He reports previous alcohol use. He reports previous drug use.  No Known Allergies  Family History  Problem Relation Age of Onset  . Hypertension Mother   . Hypertension Father     Prior to Admission medications   Medication Sig Start Date End Date Taking? Authorizing Provider  acetaminophen (TYLENOL) 325 MG tablet Take 325 mg by mouth daily with breakfast.   Yes [provider]  albuterol (PROVENTIL HFA) 108 (90 Base) MCG/ACT inhaler Inhale 2 puffs into the lungs every 6 (six) hours as needed for wheezing or shortness of breath.  10/15/17  Yes [provider]  calcium acetate (PHOSLO) 667 MG capsule Take 667 mg by mouth 3 (three) times daily with meals.   Yes [provider]  hydrALAZINE (APRESOLINE) 100 MG tablet Take 100 mg by mouth 3 (three) times daily. 07/31/19  Yes [provider]  NON FORMULARY Take 1 capsule by mouth See admin instructions. Omega XL 60 Capsules - Green Lipped Mussel [place], Omega 3 Natural Joint Pain Relief & Inflammation Supplement: Take 1 capsule by mouth once a day   Yes [provider]  pantoprazole (PROTONIX) 40 MG tablet Take 40 mg by  mouth daily before breakfast.   Yes [provider]  traZODone (DESYREL) 50 MG tablet TAKE 1 TO 2 TABLETS AT BEDTIME AS NEEDED Patient taking differently: Take 50 mg by mouth at bedtime.  01/26/20  Yes Isaac Bliss, Rayford Halsted, MD  methocarbamol (ROBAXIN) 500 MG tablet Take 1 tablet (500 mg total) by mouth every 8 (eight) hours as needed for muscle spasms. 12/21/19   Lucrezia Starch, MD     Physical Exam: Vitals:   02/29/20 1630 02/29/20 1645 02/29/20 1700 02/29/20 1745  BP: (!) 188/103 (!) 207/88 (!) 216/87 (!) 186/87  Pulse: 91 91 89 66  Resp: (!) 23 (!) 27 (!) 30 16  Temp:      TempSrc:      SpO2: 97% 97% 100% 100%  Weight:      Height:        Constitutional: NAD, calm, comfortable, communicating well Eyes: PERRL, lids and conjunctivae normal ENMT: Mucous membranes are moist. Posterior pharynx clear of any exudate or lesions.Normal dentition.  Neck: normal, supple, no masses, no thyromegaly Respiratory: Bilateral diffuse expiratory wheezing positive Cardiovascular: Regular rate and rhythm, no murmurs / rubs / gallops. No extremity edema. 2+ pedal pulses. No carotid bruits.  Abdomen: no tenderness, no masses palpated. No hepatosplenomegaly. Bowel sounds positive.  Musculoskeletal: no clubbing / cyanosis. No joint deformity upper and lower extremities. Good ROM, no contractures. Normal muscle tone.  Skin: no rashes, lesions, ulcers. No induration Neurologic: CN 2-12 grossly intact. Sensation intact, DTR normal. Strength 5/5 in all 4.  Psychiatric: Normal judgment and insight. Alert and oriented x 3. Normal mood.    Labs on Admission: I have personally reviewed following labs and imaging studies  CBC: Recent Labs  Lab 02/29/20 1408  WBC 4.8  NEUTROABS 3.4  HGB 8.3*  HCT 28.1*  MCV 93.7  PLT 619   Basic Metabolic Panel: Recent Labs  Lab 02/29/20 1408 02/29/20 1645  NA 140  --   K 3.7  --   CL 99  --   CO2 25  --   GLUCOSE 101*  --   BUN 26*  --   CREATININE 7.92* 8.56*  CALCIUM 8.3*  --   MG  --  2.2  PHOS  --  4.1   GFR: Estimated Creatinine Clearance: 6.9 mL/min (A) (by C-G formula based on SCr of 8.56 mg/dL (H)). Liver Function Tests: Recent Labs  Lab 02/29/20 1408  AST 25  ALT 20  ALKPHOS 74  BILITOT 0.8  PROT 7.3  ALBUMIN 3.5   No results for input(s): LIPASE, AMYLASE in the last 168 hours. No results for input(s): AMMONIA  in the last 168 hours. Coagulation Profile: No results for input(s): INR, PROTIME in the last 168 hours. Cardiac Enzymes: No results for input(s): CKTOTAL, CKMB, CKMBINDEX, TROPONINI in the last 168 hours. BNP (last 3 results) No results for input(s): PROBNP in the last 8760 hours. HbA1C: No results for input(s): HGBA1C in the last 72 hours. CBG: No results for input(s): GLUCAP in the last 168 hours. Lipid Profile: No results for input(s): CHOL, HDL, LDLCALC, TRIG, CHOLHDL, LDLDIRECT in the last 72 hours. Thyroid Function Tests: No results for input(s): TSH, T4TOTAL, FREET4, T3FREE, THYROIDAB in the last 72 hours. Anemia Panel: No results for input(s): VITAMINB12, FOLATE, FERRITIN, TIBC, IRON, RETICCTPCT in the last 72 hours. Urine analysis: No results found for: COLORURINE, APPEARANCEUR, LABSPEC, PHURINE, GLUCOSEU, HGBUR, BILIRUBINUR, KETONESUR, PROTEINUR, UROBILINOGEN, NITRITE, LEUKOCYTESUR  Radiological Exams on Admission: DG Chest Portable 1  View  Result Date: 02/29/2020 CLINICAL DATA:  Cough and shortness of breath. EXAM: PORTABLE CHEST 1 VIEW COMPARISON:  02/14/2020 FINDINGS: Borderline cardiomegaly with unchanged mediastinal contours. Aortic atherosclerosis. No pulmonary edema. Mild hyperinflation which appears similar to prior. No focal airspace disease, pleural effusion, or pneumothorax. No acute osseous abnormalities are seen. IMPRESSION: 1. No acute abnormality. 2. Borderline cardiomegaly, likely accentuated by portable technique. 3. Hyperinflation, unchanged from prior. Electronically Signed   By: Keith Rake M.D.   On: 02/29/2020 14:40    EKG: Independently reviewed.  Sinus rhythm, T wave inversion in lateral leads.  Atrial premature complexes.  Assessment/Plan Principal Problem:   Chest pain Active Problems:   ESRD (end stage renal disease) on dialysis ([facility])   Anemia of chronic disease   Hypertensive urgency   Elevated troponin   Atypical chest  pain: -Associated with shortness of breath.  Initial troponin elevated at 326, reviewed EKG.  No prior EKG to compare. -H&H 8.3/28.1.  BP: 200s over 120s -His chest pain could be secondary to hypertensive urgency in the setting of fluid overload secondary to interrupted dialysis session this morning. -He is afebrile with no leukocytosis.  BNP: >4500 (likely in the setting of ESRD), reviewed chest x-ray.  COVID-19 negative. -Admit patient on the floor.  On telemetry. -Nitro and morphine as needed for pain control.  On continuous pulse ox. -Trend troponin. -EDP consulted cardiology-recommended transthoracic echo and nuclear stress test -Continue to monitor vitals.  N.p.o. after midnight  Hypertensive urgency: -Continue hydralazine 100 mg 3 times daily, labetalol as needed.  Monitor blood pressure closely  End-stage renal disease on hemodialysis -Consulted nephrology -Potassium is WNL.  Chest congestion: -Wheezing noted on exam.  Oxygen saturation of 100% on room air. -Could be secondary to underlying COPD versus fluid overload -COVID-19 negative.  Chest x-ray is negative for pneumonia -DuoNeb every 4 hours. -Will start on Solu-Medrol.  Anemia of chronic disease: -H&H is 8.3/28.1 (no baseline H&H to compare) -Patient reports use of over-the-counter NSAID.  No melena or hematemesis -He tells me that he had colonoscopy a year or 2 ago which was negative -Check iron studies monitor H&H closely.  Transfuse as needed.  GERD: Stable -Continue PPI  Insomnia: Stable -Continue trazodone  Tobacco abuse: Counseled about cessation  DVT prophylaxis: Heparin [place], TED/SCD Code Status: Full code-confirmed with the patient Family Communication: None present at bedside.  Plan of care discussed with patient in length and he verbalized understanding and agreed with it. Disposition Plan: To be determined Consults called: Cardiology by EDP, nephrology Admission status: Inpatient   Mckinley Jewel MD [facility]  If 7PM-7AM, please contact night-coverage [email or web address] Password [facility]  02/29/2020, 6:41 PM

## 2020-03-01 ENCOUNTER — Inpatient Hospital Stay (HOSPITAL_COMMUNITY): Payer: Medicare Other

## 2020-03-01 ENCOUNTER — Encounter (HOSPITAL_COMMUNITY): Payer: Self-pay | Admitting: Internal Medicine

## 2020-03-01 ENCOUNTER — Telehealth: Payer: Self-pay | Admitting: Cardiology

## 2020-03-01 ENCOUNTER — Other Ambulatory Visit: Payer: Self-pay | Admitting: Medical

## 2020-03-01 DIAGNOSIS — I5032 Chronic diastolic (congestive) heart failure: Secondary | ICD-10-CM | POA: Diagnosis present

## 2020-03-01 DIAGNOSIS — D638 Anemia in other chronic diseases classified elsewhere: Secondary | ICD-10-CM

## 2020-03-01 DIAGNOSIS — F17200 Nicotine dependence, unspecified, uncomplicated: Secondary | ICD-10-CM | POA: Diagnosis present

## 2020-03-01 DIAGNOSIS — J441 Chronic obstructive pulmonary disease with (acute) exacerbation: Secondary | ICD-10-CM | POA: Diagnosis present

## 2020-03-01 DIAGNOSIS — R079 Chest pain, unspecified: Secondary | ICD-10-CM

## 2020-03-01 DIAGNOSIS — I480 Paroxysmal atrial fibrillation: Secondary | ICD-10-CM | POA: Diagnosis present

## 2020-03-01 LAB — IRON AND TIBC
Iron: 69 ug/dL (ref 45–182)
Saturation Ratios: 29 % (ref 17.9–39.5)
TIBC: 239 ug/dL — ABNORMAL LOW (ref 250–450)
UIBC: 170 ug/dL

## 2020-03-01 LAB — TROPONIN I (HIGH SENSITIVITY): Troponin I (High Sensitivity): 285 ng/L (ref <18)

## 2020-03-01 LAB — OCCULT BLOOD X 1 CARD TO LAB, STOOL: Fecal Occult Bld: NEGATIVE

## 2020-03-01 LAB — CBC
HCT: 24.2 % — ABNORMAL LOW (ref 39.0–52.0)
Hemoglobin: 7.7 g/dL — ABNORMAL LOW (ref 13.0–17.0)
MCH: 28.3 pg (ref 26.0–34.0)
MCHC: 31.8 g/dL (ref 30.0–36.0)
MCV: 89 fL (ref 80.0–100.0)
Platelets: 180 10*3/uL (ref 150–400)
RBC: 2.72 MIL/uL — ABNORMAL LOW (ref 4.22–5.81)
RDW: 17.2 % — ABNORMAL HIGH (ref 11.5–15.5)
WBC: 2.3 10*3/uL — ABNORMAL LOW (ref 4.0–10.5)
nRBC: 0 % (ref 0.0–0.2)

## 2020-03-01 LAB — ECHOCARDIOGRAM COMPLETE
Height: 68 in
Weight: 2328.06 oz

## 2020-03-01 LAB — FERRITIN: Ferritin: 68 ng/mL (ref 24–336)

## 2020-03-01 LAB — RETICULOCYTES
Immature Retic Fract: 27.8 % — ABNORMAL HIGH (ref 2.3–15.9)
RBC.: 2.78 MIL/uL — ABNORMAL LOW (ref 4.22–5.81)
Retic Count, Absolute: 78.1 10*3/uL (ref 19.0–186.0)
Retic Ct Pct: 2.8 % (ref 0.4–3.1)

## 2020-03-01 LAB — TSH: TSH: 0.379 u[IU]/mL (ref 0.350–4.500)

## 2020-03-01 MED ORDER — SODIUM CHLORIDE 0.9 % IV SOLN
125.0000 mg | INTRAVENOUS | Status: DC
  Administered 2020-03-02: 125 mg via INTRAVENOUS
  Filled 2020-03-01: qty 10

## 2020-03-01 MED ORDER — CALCIUM ACETATE (PHOS BINDER) 667 MG PO CAPS
2001.0000 mg | ORAL_CAPSULE | Freq: Three times a day (TID) | ORAL | Status: DC
  Administered 2020-03-01 – 2020-03-02 (×2): 2001 mg via ORAL
  Filled 2020-03-01 (×2): qty 3

## 2020-03-01 MED ORDER — METHYLPREDNISOLONE SODIUM SUCC 125 MG IJ SOLR
60.0000 mg | Freq: Four times a day (QID) | INTRAMUSCULAR | Status: AC
  Administered 2020-03-01 – 2020-03-02 (×3): 60 mg via INTRAVENOUS
  Filled 2020-03-01 (×3): qty 2

## 2020-03-01 MED ORDER — RENA-VITE PO TABS
1.0000 | ORAL_TABLET | Freq: Every day | ORAL | Status: DC
  Administered 2020-03-01: 1 via ORAL
  Filled 2020-03-01: qty 1

## 2020-03-01 MED ORDER — FLEET ENEMA 7-19 GM/118ML RE ENEM: 1.0000 | ENEMA | Freq: Once | RECTAL | Status: DC

## 2020-03-01 MED ORDER — IPRATROPIUM-ALBUTEROL 0.5-2.5 (3) MG/3ML IN SOLN
3.0000 mL | RESPIRATORY_TRACT | Status: AC
  Administered 2020-03-01 (×2): 3 mL via RESPIRATORY_TRACT
  Filled 2020-03-01 (×2): qty 3

## 2020-03-01 MED ORDER — CARVEDILOL 6.25 MG PO TABS: 6.2500 mg | ORAL_TABLET | Freq: Two times a day (BID) | ORAL | Status: DC

## 2020-03-01 MED ORDER — CARVEDILOL 6.25 MG PO TABS
6.2500 mg | ORAL_TABLET | Freq: Two times a day (BID) | ORAL | Status: DC
  Administered 2020-03-01 – 2020-03-02 (×2): 6.25 mg via ORAL
  Filled 2020-03-01 (×2): qty 1

## 2020-03-01 MED ORDER — CHLORHEXIDINE GLUCONATE CLOTH 2 % EX PADS: 6.0000 | MEDICATED_PAD | Freq: Every day | CUTANEOUS | Status: DC

## 2020-03-01 MED ORDER — IPRATROPIUM-ALBUTEROL 0.5-2.5 (3) MG/3ML IN SOLN
RESPIRATORY_TRACT | Status: AC
  Administered 2020-03-01: 3 mL via RESPIRATORY_TRACT
  Filled 2020-03-01: qty 3

## 2020-03-01 MED ORDER — CALCITRIOL 0.5 MCG PO CAPS
1.2500 ug | ORAL_CAPSULE | ORAL | Status: DC
  Administered 2020-03-02: 1.25 ug via ORAL

## 2020-03-01 MED ORDER — PANTOPRAZOLE SODIUM 40 MG IV SOLR
40.0000 mg | Freq: Two times a day (BID) | INTRAVENOUS | Status: DC
  Administered 2020-03-01 – 2020-03-02 (×2): 40 mg via INTRAVENOUS
  Filled 2020-03-01 (×2): qty 40

## 2020-03-01 NOTE — Progress Notes (Signed)
2D echo reviewed and shows low normal LVF with no RWMAs.  He is actively wheezing with sx c/w acute COPD exacerbation.  Suspect that his SOB is due to volume overload from insufficient volume removal at HD yesterday and acute bronchitis. He needs aggressive pulmonary toilet and treatment for probable acute bronchitis given recent hx of cough productive of green sputum.    At this time respiratory status not good enough to proceed with nuclear stress test.  Recommend waiting until respiratory sx resolve.  His hsTrop is elevated but flat and suspect this it due to demand ischemia in the setting of acute respiratory illness, hypertensive urgency as well as volume overload.  2D echo reassuring.  Will set up for 2 week outpt lexiscan myoview to rule out ischemia once lungs are better.  Coronary CTA would be poor quality due to ESRD and likely significant calcium. He also needs his anemia addressed at [facility] was 7.7 this am.  His CP is atypical and more epigastric which, in setting of anemia, is more concerning for GI etiology of chest pain. TRH/nephrology to manage BP meds.   Will sign off.  Please call if his has any further CP.  Otherwise we will have an appt for nuclear stress test in our office in 2 weeks.

## 2020-03-01 NOTE — Progress Notes (Signed)
Progress Note    Anthony [name]  PNT:[id number] DOB: 03/07/1941  DOA: 02/29/2020 PCP: Isaac Bliss, Rayford Halsted, MD    Brief Narrative:    Medical records reviewed and are as summarized below:  Anthony [name] is an 79 y.o. male with a past medical history that includes end-stage renal disease on hemodialysis Monday Wednesday Friday, hypertension, PAF, CVA, anemia of chronic disease, tobacco use, EtOH use, chronic diastolic heart failure, COPD presented June 9 with chest pain and shortness of breath. Work up reveals hypertensive urgency, worsening anemia, border cardiomegaly per chest x-ray and elevated troponin.   Assessment/Plan:   Principal Problem:   Chest pain Active Problems:   Anemia of chronic disease   Hypertensive urgency   Elevated troponin   Chronic diastolic heart failure (HCC)   COPD exacerbation (HCC)   ESRD (end stage renal disease) on dialysis (HCC)   PAF (paroxysmal atrial fibrillation) (HCC)   Tobacco use disorder  #1.  Chest pain/elevated troponin.  Typical and atypical features.  EKG shows LVH, hs troponin 326>310>285, 2D echo with an EF of 50% and grade 2 diastolic dysfunction.  Evaluated by cardiology who opine elevated troponins and LVH on EKG may be related to poorly controlled hypertension and demand ischemia in the setting of hypertensive urgency.  Recommended stress test that was canceled today due to COPD exacerbation.  They also note that his chest pain is of 2 types epigastric and upper chest near the throat.  Recommend improved blood pressure control and aggressive treatment of COPD exacerbation. No events on tele. Pain free this am -blood pressure control -soumedrol, labs, scheduled nebs, flutter valve incentive spirometry -supportive therapy -Consider outpatient stress test per cardiology recommendation -monitor  #2.  Hypertensive urgency.  Blood pressure improved but remains on the high end of normal.  Home medications include hydralazine 100 mg  3 times daily.  Echo as noted above.  Evaluated by cardiology recommend carvedilol 6.25 mg twice daily to start this morning. -Hydralazine per home meds -Coreg per cardiology -Monitor closely  #3.  COPD exacerbation.  Home meds do include an inhaler.  He continues to smoke.  He denies ever being told he had COPD.  Wheezes throughout.  Oxygen saturation level greater than 90% on room air. -Scheduled nebs -Increase Solu-Medrol -Flutter valve -Incentive spirometry -Wean oxygen as able  #4.  Anemia of chronic disease.  Hemoglobin 8.3 on admission and 7.7 this morning.  He is got a history of EtOH.  Presents with epigastric pain.  Chart review indicates he had a colonoscopy in [place] in 2020 revealing 2 polyps that were removed, colon with diverticulosis and nonbleeding internal and external hemorrhoids. -FOBT -stop heparin - GI Consult likely  -transfuse as indicated -monitor  #5.  PAF.  EKG with normal sinus rhythm.  Cardiology started beta-blocker this morning.  Chart review indicates not on anticoagulation due to history of polysubstance abuse and GI bleed.  See #4  #6.  Chronic diastolic heart failure.  He is a Monday Wednesday Friday dialysis patient.  Does not appear overloaded.  Echo today reveals an EF of 08% grade 2 diastolic dysfunction. -Daily weights -Intake and output -Dialysis per nephrology  #7. End-stage renal disease on dialysis.  - dialysis per nephrology    Family Communication/Anticipated D/C date and plan/Code Status   DVT prophylaxis:  scd's ordered. Code Status: Full Code.  Family Communication: patient Disposition Plan: Status is: Inpatient  Remains inpatient appropriate because:Inpatient level of care appropriate due to severity  of illness   Dispo: The patient is from: Home              Anticipated d/c is to: Home              Anticipated d/c date is: 2 days              Patient currently is not medically stable to d/c.          Medical Consultants:    None.   Anti-Infectives:    None  Subjective:   Denies pain/discomfort. Wants something to eat Objective:    Vitals:   02/29/20 2214 02/29/20 2226 03/01/20 0613 03/01/20 1001  BP: (!) 196/83  (!) 157/92 (!) 152/63  Pulse: 74  83 71  Resp: 17  16 17   Temp: 97.9 F (36.6 C)  97.7 F (36.5 C) 98.1 F (36.7 C)  TempSrc: Oral  Oral Oral  SpO2: 100% 99% 100% 99%  Weight:      Height:        Intake/Output Summary (Last 24 hours) at 03/01/2020 1246 Last data filed at 03/01/2020 0800 Gross per 24 hour  Intake 0 ml  Output --  Net 0 ml   Filed Weights   02/29/20 1404  Weight: 66 kg    Exam: General: Well-nourished chronically ill-appearing poor dentition no acute distress CV: Irregularly irregular no murmur gallop or rub no lower extremity edema Respiratory: No increased work of breathing breath sounds with good air movement but quite coarse with wheezes and rhonchi throughout Abdomen: Slightly distended soft positive bowel sounds throughout mild diffuse tenderness particularly epigastric area to palpation no guarding or rebounding Musculoskeletal: Joints without swelling/erythema full range of motion Neuro: Alert and oriented x3 speech clear facial symmetry bilateral grip 5 out of 5  Data Reviewed:   I have personally reviewed following labs and imaging studies:  Labs: Labs show the following:   Basic Metabolic Panel: Recent Labs  Lab 02/29/20 1408 02/29/20 1645  NA 140  --   K 3.7  --   CL 99  --   CO2 25  --   GLUCOSE 101*  --   BUN 26*  --   CREATININE 7.92* 8.56*  CALCIUM 8.3*  --   MG  --  2.2  PHOS  --  4.1   GFR Estimated Creatinine Clearance: 6.9 mL/min (A) (by C-G formula based on SCr of 8.56 mg/dL (H)). Liver Function Tests: Recent Labs  Lab 02/29/20 1408  AST 25  ALT 20  ALKPHOS 74  BILITOT 0.8  PROT 7.3  ALBUMIN 3.5   No results for input(s): LIPASE, AMYLASE in the last 168 hours. No results for  input(s): AMMONIA in the last 168 hours. Coagulation profile No results for input(s): INR, PROTIME in the last 168 hours.  CBC: Recent Labs  Lab 02/29/20 1408 03/01/20 0128  WBC 4.8 2.3*  NEUTROABS 3.4  --   HGB 8.3* 7.7*  HCT 28.1* 24.2*  MCV 93.7 89.0  PLT 205 180   Cardiac Enzymes: No results for input(s): CKTOTAL, CKMB, CKMBINDEX, TROPONINI in the last 168 hours. BNP (last 3 results) No results for input(s): PROBNP in the last 8760 hours. CBG: No results for input(s): GLUCAP in the last 168 hours. D-Dimer: No results for input(s): DDIMER in the last 72 hours. Hgb A1c: No results for input(s): HGBA1C in the last 72 hours. Lipid Profile: No results for input(s): CHOL, HDL, LDLCALC, TRIG, CHOLHDL, LDLDIRECT in the last 72 hours. Thyroid  function studies: Recent Labs    03/01/20 0128  TSH 0.379   Anemia work up: Recent Labs    03/01/20 0128  FERRITIN 68  TIBC 239*  IRON 69   Sepsis Labs: Recent Labs  Lab 02/29/20 1408 03/01/20 0128  WBC 4.8 2.3*    Microbiology Recent Results (from the past 240 hour(s))  SARS Coronavirus 2 by RT PCR (hospital order, performed in [facility] hospital lab) Nasopharyngeal Nasopharyngeal Swab     Status: None   Collection Time: 02/29/20  2:26 PM   Specimen: Nasopharyngeal Swab  Result Value Ref Range Status   SARS Coronavirus 2 NEGATIVE NEGATIVE Final    Comment: (NOTE) SARS-CoV-2 target nucleic acids are NOT DETECTED. The SARS-CoV-2 RNA is generally detectable in upper and lower respiratory specimens during the acute phase of infection. The lowest concentration of SARS-CoV-2 viral copies this assay can detect is 250 copies / mL. A negative result does not preclude SARS-CoV-2 infection and should not be used as the sole basis for treatment or other patient management decisions.  A negative result may occur with improper specimen collection / handling, submission of specimen other than nasopharyngeal swab, presence of  viral mutation(s) within the areas targeted by this assay, and inadequate number of viral copies (<250 copies / mL). A negative result must be combined with clinical observations, patient history, and epidemiological information. Fact Sheet for Patients:   StrictlyIdeas.no Fact Sheet for Healthcare Providers: BankingDealers.co.za This test is not yet approved or cleared  by the [place] FDA and has been authorized for detection and/or diagnosis of SARS-CoV-2 by FDA under an Emergency Use Authorization (EUA).  This EUA will remain in effect (meaning this test can be used) for the duration of the COVID-19 declaration under Section 564(b)(1) of the Act, 21 U.S.C. section 360bbb-3(b)(1), unless the authorization is terminated or revoked sooner. Performed at [facility], [place] [address] [place], [place] [address]     Procedures and diagnostic studies:  DG Chest Portable 1 View  Result Date: 02/29/2020 CLINICAL DATA:  Cough and shortness of breath. EXAM: PORTABLE CHEST 1 VIEW COMPARISON:  02/14/2020 FINDINGS: Borderline cardiomegaly with unchanged mediastinal contours. Aortic atherosclerosis. No pulmonary edema. Mild hyperinflation which appears similar to prior. No focal airspace disease, pleural effusion, or pneumothorax. No acute osseous abnormalities are seen. IMPRESSION: 1. No acute abnormality. 2. Borderline cardiomegaly, likely accentuated by portable technique. 3. Hyperinflation, unchanged from prior. Electronically Signed   By: Keith Rake M.D.   On: 02/29/2020 14:40   ECHOCARDIOGRAM COMPLETE  Result Date: 03/01/2020    ECHOCARDIOGRAM REPORT   Patient Name:   Anthony [name] Date of Exam: 03/01/2020 Medical Rec #:  [id number]      Height:       68.0 in Accession #:    [id number]     Weight:       145.5 lb Date of Birth:  1941-06-08       BSA:          1.785 m Patient Age:    50 years       BP:           157/92 mmHg Patient  Gender: M              HR:           87 bpm. Exam Location:  Inpatient Procedure: 2D Echo, Cardiac Doppler and Color Doppler             STAT ECHO Reported  to: Dr. Skeet Latch.                                 MODIFIED REPORT: This report was modified by Skeet Latch MD on 03/01/2020 due to IVC dilated.  Indications:     R07.9* Chest pain, unspecified  History:         Patient has no prior history of Echocardiogram examinations.                  Stroke; Risk Factors:Hypertension and Current Smoker. CKD.                  Alcohol abuse.  Sonographer:     Jonelle Sidle Dance Referring Phys:  [id number] Tami Lin [facility] Diagnosing Phys: Skeet Latch MD IMPRESSIONS  1. Left ventricular ejection fraction, by estimation, is 50 to 55%. The left ventricle has low normal function. The left ventricle has no regional wall motion abnormalities. The left ventricular internal cavity size was mildly dilated. There is mild concentric left ventricular hypertrophy. Left ventricular diastolic parameters are consistent with Grade II diastolic dysfunction (pseudonormalization).  2. Right ventricular systolic function is normal. The right ventricular size is normal. There is moderately elevated pulmonary artery systolic pressure.  3. Left atrial size was severely dilated.  4. Right atrial size was moderately dilated.  5. The mitral valve is normal in structure. Trivial mitral valve regurgitation. No evidence of mitral stenosis.  6. The aortic valve is normal in structure. Aortic valve regurgitation is not visualized. No aortic stenosis is present.  7. The inferior vena cava is dilated in size with <50% respiratory variability, suggesting right atrial pressure of 15 mmHg. FINDINGS  Left Ventricle: Left ventricular ejection fraction, by estimation, is 50 to 55%. The left ventricle has low normal function. The left ventricle has no regional wall motion abnormalities. The left ventricular internal cavity size was mildly dilated. There is  mild concentric left ventricular hypertrophy. Left ventricular diastolic parameters are consistent with Grade II diastolic dysfunction (pseudonormalization). Right Ventricle: The right ventricular size is normal. No increase in right ventricular wall thickness. Right ventricular systolic function is normal. There is moderately elevated pulmonary artery systolic pressure. The tricuspid regurgitant velocity is 3.09 m/s, and with an assumed right atrial pressure of 15 mmHg, the estimated right ventricular systolic pressure is 53.2 mmHg. Left Atrium: Left atrial size was severely dilated. Right Atrium: Right atrial size was moderately dilated. Pericardium: There is no evidence of pericardial effusion. Mitral Valve: The mitral valve is normal in structure. Normal mobility of the mitral valve leaflets. Mild mitral annular calcification. Trivial mitral valve regurgitation. No evidence of mitral valve stenosis. Tricuspid Valve: The tricuspid valve is normal in structure. Tricuspid valve regurgitation is trivial. No evidence of tricuspid stenosis. Aortic Valve: The aortic valve is normal in structure.. There is mild thickening and mild calcification of the aortic valve. Aortic valve regurgitation is not visualized. No aortic stenosis is present. There is mild thickening of the aortic valve. There is mild calcification of the aortic valve. Pulmonic Valve: The pulmonic valve was normal in structure. Pulmonic valve regurgitation is not visualized. No evidence of pulmonic stenosis. Aorta: The aortic root is normal in size and structure. Venous: The inferior vena cava is dilated in size with less than 50% respiratory variability, suggesting right atrial pressure of 15 mmHg. IAS/Shunts: No atrial level shunt detected by color flow Doppler.  LEFT VENTRICLE PLAX 2D LVIDd:  5.80 cm LVIDs:         4.90 cm LV PW:         1.20 cm LV IVS:        1.20 cm LVOT diam:     2.20 cm LV SV:         106 LV SV Index:   60 LVOT Area:      3.80 cm  RIGHT VENTRICLE             IVC RV Basal diam:  3.60 cm     IVC diam: 2.50 cm RV Mid diam:    2.20 cm RV S prime:     14.60 cm/s TAPSE (M-mode): 2.4 cm LEFT ATRIUM              Index       RIGHT ATRIUM           Index LA diam:        5.00 cm  2.80 cm/m  RA Area:     20.40 cm LA Vol (A2C):   136.0 ml 76.18 ml/m RA Volume:   61.50 ml  34.45 ml/m LA Vol (A4C):   109.0 ml 61.05 ml/m LA Biplane Vol: 124.0 ml 69.46 ml/m  AORTIC VALVE LVOT Vmax:   140.00 cm/s LVOT Vmean:  85.350 cm/s LVOT VTI:    0.280 m  AORTA Ao Root diam: 3.00 cm Ao Asc diam:  3.30 cm MITRAL VALVE                TRICUSPID VALVE MV Area (PHT): 2.83 cm     TR Peak grad:   38.2 mmHg MV Decel Time: 268 msec     TR Vmax:        309.00 cm/s MV E velocity: 150.00 cm/s MV A velocity: 107.00 cm/s  SHUNTS MV E/A ratio:  1.40         Systemic VTI:  0.28 m                             Systemic Diam: 2.20 cm Skeet Latch MD Electronically signed by Skeet Latch MD Signature Date/Time: 03/01/2020/10:17:43 AM    Final (Updated)     Medications:    calcium acetate  667 mg Oral TID WC   carvedilol  6.25 mg Oral BID WC   heparin  5,000 Units Subcutaneous Q8H   hydrALAZINE  100 mg Oral TID   ipratropium-albuterol  3 mL Nebulization Q4H   methylPREDNISolone (SOLU-MEDROL) injection  60 mg Intravenous Q6H   pantoprazole  40 mg Oral QAC breakfast   traZODone  50 mg Oral QHS   Continuous Infusions:   LOS: 1 day   Radene Gunning NP  [facility]   How to contact the [facility] Attending or Consulting provider 7A - 7P or covering provider during after hours [place], for this patient?  1. Check the care team in [facility] and look for a) attending/consulting [facility] provider listed and b) the [facility] team listed 2. Log into [email or web address] and use [place]'s universal password to access. If you do not have the password, please contact the hospital operator. 3. Locate the [facility] provider you are looking for under [facility] and page to a  number that you can be directly reached. 4. If you still have difficulty reaching the provider, please page the [facility] (Director on Call) for the Hospitalists listed on amion for assistance.  03/01/2020, 12:46 PM

## 2020-03-01 NOTE — Progress Notes (Addendum)
Progress Note  Patient Name: Anthony [name] Date of Encounter: 03/01/2020  Primary Cardiologist: Fransico Him, MD   Subjective   Denies any current CP or SOB.  hsTrop elevated but flat trend  Inpatient Medications    Scheduled Meds: . calcium acetate  667 mg Oral TID WC  . heparin  5,000 Units Subcutaneous Q8H  . hydrALAZINE  100 mg Oral TID  . methylPREDNISolone (SOLU-MEDROL) injection  60 mg Intravenous Q12H  . pantoprazole  40 mg Oral QAC breakfast  . traZODone  50 mg Oral QHS   Continuous Infusions:  PRN Meds: acetaminophen **OR** acetaminophen, ipratropium-albuterol, labetalol, morphine injection, nitroGLYCERIN, ondansetron **OR** ondansetron (ZOFRAN) IV   Vital Signs    Vitals:   02/29/20 2153 02/29/20 2214 02/29/20 2226 03/01/20 0613  BP: (!) 187/103 (!) 196/83  (!) 157/92  Pulse: 80 74  83  Resp: 18 17  16   Temp: (!) 97.3 F (36.3 C) 97.9 F (36.6 C)  97.7 F (36.5 C)  TempSrc: Oral Oral  Oral  SpO2: 97% 100% 99% 100%  Weight:      Height:        Intake/Output Summary (Last 24 hours) at 03/01/2020 0828 Last data filed at 03/01/2020 0800 Gross per 24 hour  Intake 0 ml  Output --  Net 0 ml   Filed Weights   02/29/20 1404  Weight: 66 kg    Telemetry    NSR - Personally Reviewed  ECG    No new EKG to review - Personally Reviewed  Physical Exam   GEN: No acute distress.   Neck: No JVD Cardiac: RRR, no murmurs, rubs, or gallops.  Respiratory: Clear to auscultation bilaterally. GI: Soft, nontender, non-distended  MS: No edema; No deformity. Neuro:  Nonfocal  Psych: Normal affect   Labs    Chemistry Recent Labs  Lab 02/29/20 1408 02/29/20 1645  NA 140  --   K 3.7  --   CL 99  --   CO2 25  --   GLUCOSE 101*  --   BUN 26*  --   CREATININE 7.92* 8.56*  CALCIUM 8.3*  --   PROT 7.3  --   ALBUMIN 3.5  --   AST 25  --   ALT 20  --   ALKPHOS 74  --   BILITOT 0.8  --   GFRNONAA 6* 5*  GFRAA 7* 6*  ANIONGAP 16*  --       Hematology Recent Labs  Lab 02/29/20 1408 03/01/20 0128  WBC 4.8 2.3*  RBC 3.00* 2.72*  HGB 8.3* 7.7*  HCT 28.1* 24.2*  MCV 93.7 89.0  MCH 27.7 28.3  MCHC 29.5* 31.8  RDW 17.2* 17.2*  PLT 205 180    Cardiac EnzymesNo results for input(s): TROPONINI in the last 168 hours. No results for input(s): TROPIPOC in the last 168 hours.   BNP Recent Labs  Lab 02/29/20 1408  BNP >4,500.0*     DDimer No results for input(s): DDIMER in the last 168 hours.   Radiology    DG Chest Portable 1 View  Result Date: 02/29/2020 CLINICAL DATA:  Cough and shortness of breath. EXAM: PORTABLE CHEST 1 VIEW COMPARISON:  02/14/2020 FINDINGS: Borderline cardiomegaly with unchanged mediastinal contours. Aortic atherosclerosis. No pulmonary edema. Mild hyperinflation which appears similar to prior. No focal airspace disease, pleural effusion, or pneumothorax. No acute osseous abnormalities are seen. IMPRESSION: 1. No acute abnormality. 2. Borderline cardiomegaly, likely accentuated by portable technique. 3. Hyperinflation, unchanged from prior.  Electronically Signed   By: Keith Rake M.D.   On: 02/29/2020 14:40    Cardiac Studies   none  Patient Profile     79 y.o. male with a hx of ESRD on HD, smoker, PAF (in the 1980s after surgery), anemia, HTN, COPD, chronic diastolic heart failure, and prior CVA who is being seen for the evaluation of chest pain at the request of Dr. Ralene Bathe.  Assessment & Plan    1.  Chest pain -he has 2 types of CP -one pain is epigastric and upper abdominal and may be related to GI etiology especially in setting of ETOH use and anemia  -the other CP is in upper chest and throat and occurs a few times a month associated with diaphoresis and SOB as well but no radiation into his arms -his BP is poorly controlled and could be contributing to CP -hsTrop is elevated at 326>310>285 and flat trend so may be demand ischemia in the setting of hypertensive urgency -EKG shows LVH  c/w poorly controlled HTN and repol abnormality -2D echo currently being done -if EF normal then needs Lexiscan myoview but currently he is actively wheezing throughout so will cancel nuclear stress test for now -needs aggressive treatment of likely COPD exacerbation -need aggressive control of BP -given anemia and epigastric pain, consider GI workup as well -can get outpt nuclear stress test if 2D echo is normal  2.  PAF -maintaining NSR on EKG -deemed in the past not to be a candidate for anticoagulation due to polysubstance abuse  3.  Hypertensive urgency -SBP in the 200's on admit -SBP improved but still elevated this am at 157/57mmhg -he is on very little antihypertensive meds and has evidence of end organ damage with ESRD as well as LVH on EKG -2D echo to assess for LVF and LVH is pending -needs aggressive BP control -will start carvedilol 6.25mg  BID this am -continue Hydralazine 100mg  TID -consider addition of ARB since now on HD for his ESRD  4. Chronic diastolic CHF -EF normal on echo 2019 -volume management per nephrology on HD -repeat 2D echo  5.  ESRD -on HD -BNP>4500 and likely related to insuff HD earlier today as he only had 90 min on the machine  6.  Anemia -Hbg 8.3 -likely a component of chronic disease from ESRD but with epigastric pain and hx of ETOH, need to consider GI blood loss -per TRH -per renal  7.  COPD exacerbation -he is actively wheezing and gives hx of increased cough productive of green sputum -continue steroids -consider nebs and antibx -will defer to TRH     I have spent a total of 35 minutes with patient reviewing hospital notes , telemetry, EKGs, labs and examining patient as well as establishing an assessment and plan that was discussed with the patient.  > 50% of time was spent in direct patient care.    For questions or updates, please contact [place] Please consult [email or web address] for contact info under  Cardiology/STEMI.      Signed, Fransico Him, MD  03/01/2020, 8:28 AM

## 2020-03-01 NOTE — Consult Note (Addendum)
[facility] Renal Consultation Note    Indication for Consultation:  Management of ESRD/hemodialysis; anemia, hypertension/volume and secondary hyperparathyroidism PCP: Isaac Bliss, Rayford Halsted, MD   HPI: Anthony [name] is a 79 y.o. male with ESRD on MWF HD at [facility] new to our practice this year when he moved to [place] from [place], [place] to live with his wife. Medical history is significant for HTN, alcohol abuse, hx CVA, tobacco use and prior drug use.  He has a pattern of shortened HD treatments either arriving late or leaving early.  As recently as Monday, he came in at EDW with high BP.  He left 0.5 below EDW without a BP drop.  Wednesday he came in SOB with high BP - about an hour into treatment he had CP and was transferred to the ED for admission.  Labs yesterday after an hour of HD Wednesday showed K 3.7 BUN 26 Cr 7.92 normal LFT BNP > 4500 .  Trop have been flat 326>310>285 Tsat 29% ferritin 68 hgb 8.3.WBC 4.3   Labs today show low WBC 2.3 hgb 7.7 plts 180  CXR showed NAD, hyperinflation TSH 0.379   He tells me he quit drinking two months ago and doesn't smoke tobacco products or use any drugs. At the moment he is denying any epigastric pain and minimizes the history of his presentation yesterday.   He denies orthopnea or cough. He tells me wheezing is new for him. He has no N, V, fever or chills or falls.. No issues with his bowels. Admission notes that he reports taking NSAIDs for epigastric pain.  He reports cramping on dialysis at times but not always. He does not recall every been being told he has COPD.  Past Medical History:  Diagnosis Date  . Alcohol abuse   . Arthritis   . CHF (congestive heart failure) ([place])   . Chicken pox   . Chronic kidney disease   . ESRD (end stage renal disease) on dialysis ([place])   . GI bleed   . Hypertension   . Insomnia   . Stroke [facility])    History reviewed. No pertinent surgical history. Family History  Problem Relation Age  of Onset  . Hypertension Mother   . Hypertension Father    Social History:  reports that he has been smoking. He has never used smokeless tobacco. He reports previous alcohol use. He reports previous drug use. No Known Allergies Prior to Admission medications   Medication Sig Start Date End Date Taking? Authorizing Provider  acetaminophen (TYLENOL) 325 MG tablet Take 325 mg by mouth daily with breakfast.   Yes [provider]  albuterol (PROVENTIL HFA) 108 (90 Base) MCG/ACT inhaler Inhale 2 puffs into the lungs every 6 (six) hours as needed for wheezing or shortness of breath.  10/15/17  Yes [provider]  calcium acetate (PHOSLO) 667 MG capsule Take 667 mg by mouth 3 (three) times daily with meals.   Yes [provider]  hydrALAZINE (APRESOLINE) 100 MG tablet Take 100 mg by mouth 3 (three) times daily. 07/31/19  Yes [provider]  NON FORMULARY Take 1 capsule by mouth See admin instructions. Omega XL 60 Capsules - Green Lipped Mussel [place], Omega 3 Natural Joint Pain Relief & Inflammation Supplement: Take 1 capsule by mouth once a day   Yes [provider]  pantoprazole (PROTONIX) 40 MG tablet Take 40 mg by mouth daily before breakfast.   Yes [provider]  traZODone (DESYREL) 50 MG  tablet TAKE 1 TO 2 TABLETS AT BEDTIME AS NEEDED Patient taking differently: Take 50 mg by mouth at bedtime.  01/26/20  Yes Isaac Bliss, Rayford Halsted, MD  methocarbamol (ROBAXIN) 500 MG tablet Take 1 tablet (500 mg total) by mouth every 8 (eight) hours as needed for muscle spasms. 12/21/19   Lucrezia Starch, MD   Current Facility-Administered Medications  Medication Dose Route Frequency Provider Last Rate Last Admin  . acetaminophen (TYLENOL) tablet 650 mg  650 mg Oral Q6H PRN Pahwani, Rinka R, MD       Or  . acetaminophen (TYLENOL) suppository 650 mg  650 mg Rectal Q6H PRN Pahwani, Rinka R, MD      . calcium acetate (PHOSLO) capsule 667 mg  667 mg  Oral TID WC Pahwani, Rinka R, MD   667 mg at 03/01/20 1132  . carvedilol (COREG) tablet 6.25 mg  6.25 mg Oral BID WC Radene Gunning, NP      . heparin injection 5,000 Units  5,000 Units Subcutaneous Q8H Pahwani, Rinka R, MD   5,000 Units at 03/01/20 0618  . hydrALAZINE (APRESOLINE) tablet 100 mg  100 mg Oral TID Pahwani, Rinka R, MD   100 mg at 03/01/20 0805  . ipratropium-albuterol (DUONEB) 0.5-2.5 (3) MG/3ML nebulizer solution 3 mL  3 mL Nebulization Q4H Black, Lezlie Octave, NP      . labetalol (NORMODYNE) injection 10 mg  10 mg Intravenous Q2H PRN Pahwani, Rinka R, MD   10 mg at 02/29/20 1715  . methylPREDNISolone sodium succinate (SOLU-MEDROL) 125 mg/2 mL injection 60 mg  60 mg Intravenous Q6H Black, Lezlie Octave, NP      . morphine 2 MG/ML injection 2 mg  2 mg Intravenous Q4H PRN Pahwani, Rinka R, MD      . nitroGLYCERIN (NITROSTAT) SL tablet 0.4 mg  0.4 mg Sublingual Q5 min PRN Pahwani, Rinka R, MD      . ondansetron (ZOFRAN) tablet 4 mg  4 mg Oral Q6H PRN Pahwani, Rinka R, MD       Or  . ondansetron (ZOFRAN) injection 4 mg  4 mg Intravenous Q6H PRN Pahwani, Rinka R, MD      . pantoprazole (PROTONIX) EC tablet 40 mg  40 mg Oral QAC breakfast Pahwani, Rinka R, MD      . traZODone (DESYREL) tablet 50 mg  50 mg Oral QHS Pahwani, Rinka R, MD   50 mg at 02/29/20 2228   Labs: Basic Metabolic Panel: Recent Labs  Lab 02/29/20 1408 02/29/20 1645  NA 140  --   K 3.7  --   CL 99  --   CO2 25  --   GLUCOSE 101*  --   BUN 26*  --   CREATININE 7.92* 8.56*  CALCIUM 8.3*  --   PHOS  --  4.1   Liver Function Tests: Recent Labs  Lab 02/29/20 1408  AST 25  ALT 20  ALKPHOS 74  BILITOT 0.8  PROT 7.3  ALBUMIN 3.5   No results for input(s): LIPASE, AMYLASE in the last 168 hours. No results for input(s): AMMONIA in the last 168 hours. CBC: Recent Labs  Lab 02/29/20 1408 03/01/20 0128  WBC 4.8 2.3*  NEUTROABS 3.4  --   HGB 8.3* 7.7*  HCT 28.1* 24.2*  MCV 93.7 89.0  PLT 205 180   Cardiac  Enzymes: No results for input(s): CKTOTAL, CKMB, CKMBINDEX, TROPONINI in the last 168 hours. CBG: No results for input(s): GLUCAP in the last 168  hours. Iron Studies:  Recent Labs    03/01/20 0128  IRON 69  TIBC 239*  FERRITIN 68   Studies/Results: DG Chest Portable 1 View  Result Date: 02/29/2020 CLINICAL DATA:  Cough and shortness of breath. EXAM: PORTABLE CHEST 1 VIEW COMPARISON:  02/14/2020 FINDINGS: Borderline cardiomegaly with unchanged mediastinal contours. Aortic atherosclerosis. No pulmonary edema. Mild hyperinflation which appears similar to prior. No focal airspace disease, pleural effusion, or pneumothorax. No acute osseous abnormalities are seen. IMPRESSION: 1. No acute abnormality. 2. Borderline cardiomegaly, likely accentuated by portable technique. 3. Hyperinflation, unchanged from prior. Electronically Signed   By: Keith Rake M.D.   On: 02/29/2020 14:40   ECHOCARDIOGRAM COMPLETE  Result Date: 03/01/2020    ECHOCARDIOGRAM REPORT   Patient Name:   Anthony [name] Date of Exam: 03/01/2020 Medical Rec #:  [id number]      Height:       68.0 in Accession #:    [id number]     Weight:       145.5 lb Date of Birth:  December 26, 1940       BSA:          1.785 m Patient Age:    36 years       BP:           157/92 mmHg Patient Gender: M              HR:           87 bpm. Exam Location:  Inpatient Procedure: 2D Echo, Cardiac Doppler and Color Doppler             STAT ECHO Reported to: Dr. Skeet Latch.                                 MODIFIED REPORT: This report was modified by Skeet Latch MD on 03/01/2020 due to IVC dilated.  Indications:     R07.9* Chest pain, unspecified  History:         Patient has no prior history of Echocardiogram examinations.                  Stroke; Risk Factors:Hypertension and Current Smoker. CKD.                  Alcohol abuse.  Sonographer:     Jonelle Sidle Dance Referring Phys:  [id number] Tami Lin [facility] Diagnosing Phys: Skeet Latch MD IMPRESSIONS  1. Left  ventricular ejection fraction, by estimation, is 50 to 55%. The left ventricle has low normal function. The left ventricle has no regional wall motion abnormalities. The left ventricular internal cavity size was mildly dilated. There is mild concentric left ventricular hypertrophy. Left ventricular diastolic parameters are consistent with Grade II diastolic dysfunction (pseudonormalization).  2. Right ventricular systolic function is normal. The right ventricular size is normal. There is moderately elevated pulmonary artery systolic pressure.  3. Left atrial size was severely dilated.  4. Right atrial size was moderately dilated.  5. The mitral valve is normal in structure. Trivial mitral valve regurgitation. No evidence of mitral stenosis.  6. The aortic valve is normal in structure. Aortic valve regurgitation is not visualized. No aortic stenosis is present.  7. The inferior vena cava is dilated in size with <50% respiratory variability, suggesting right atrial pressure of 15 mmHg. FINDINGS  Left Ventricle: Left ventricular ejection fraction, by estimation, is 50 to 55%. The left ventricle has low normal function.  The left ventricle has no regional wall motion abnormalities. The left ventricular internal cavity size was mildly dilated. There is mild concentric left ventricular hypertrophy. Left ventricular diastolic parameters are consistent with Grade II diastolic dysfunction (pseudonormalization). Right Ventricle: The right ventricular size is normal. No increase in right ventricular wall thickness. Right ventricular systolic function is normal. There is moderately elevated pulmonary artery systolic pressure. The tricuspid regurgitant velocity is 3.09 m/s, and with an assumed right atrial pressure of 15 mmHg, the estimated right ventricular systolic pressure is 94.0 mmHg. Left Atrium: Left atrial size was severely dilated. Right Atrium: Right atrial size was moderately dilated. Pericardium: There is no evidence  of pericardial effusion. Mitral Valve: The mitral valve is normal in structure. Normal mobility of the mitral valve leaflets. Mild mitral annular calcification. Trivial mitral valve regurgitation. No evidence of mitral valve stenosis. Tricuspid Valve: The tricuspid valve is normal in structure. Tricuspid valve regurgitation is trivial. No evidence of tricuspid stenosis. Aortic Valve: The aortic valve is normal in structure.. There is mild thickening and mild calcification of the aortic valve. Aortic valve regurgitation is not visualized. No aortic stenosis is present. There is mild thickening of the aortic valve. There is mild calcification of the aortic valve. Pulmonic Valve: The pulmonic valve was normal in structure. Pulmonic valve regurgitation is not visualized. No evidence of pulmonic stenosis. Aorta: The aortic root is normal in size and structure. Venous: The inferior vena cava is dilated in size with less than 50% respiratory variability, suggesting right atrial pressure of 15 mmHg. IAS/Shunts: No atrial level shunt detected by color flow Doppler.  LEFT VENTRICLE PLAX 2D LVIDd:         5.80 cm LVIDs:         4.90 cm LV PW:         1.20 cm LV IVS:        1.20 cm LVOT diam:     2.20 cm LV SV:         106 LV SV Index:   60 LVOT Area:     3.80 cm  RIGHT VENTRICLE             IVC RV Basal diam:  3.60 cm     IVC diam: 2.50 cm RV Mid diam:    2.20 cm RV S prime:     14.60 cm/s TAPSE (M-mode): 2.4 cm LEFT ATRIUM              Index       RIGHT ATRIUM           Index LA diam:        5.00 cm  2.80 cm/m  RA Area:     20.40 cm LA Vol (A2C):   136.0 ml 76.18 ml/m RA Volume:   61.50 ml  34.45 ml/m LA Vol (A4C):   109.0 ml 61.05 ml/m LA Biplane Vol: 124.0 ml 69.46 ml/m  AORTIC VALVE LVOT Vmax:   140.00 cm/s LVOT Vmean:  85.350 cm/s LVOT VTI:    0.280 m  AORTA Ao Root diam: 3.00 cm Ao Asc diam:  3.30 cm MITRAL VALVE                TRICUSPID VALVE MV Area (PHT): 2.83 cm     TR Peak grad:   38.2 mmHg MV Decel Time:  268 msec     TR Vmax:        309.00 cm/s MV E velocity: 150.00 cm/s MV A velocity: 107.00  cm/s  SHUNTS MV E/A ratio:  1.40         Systemic VTI:  0.28 m                             Systemic Diam: 2.20 cm Skeet Latch MD Electronically signed by Skeet Latch MD Signature Date/Time: 03/01/2020/10:17:43 AM    Final (Updated)     ROS: As per HPI otherwise negative. I think he is a bit of a marginal historian.  Physical Exam: Vitals:   02/29/20 2214 02/29/20 2226 03/01/20 0613 03/01/20 1001  BP: (!) 196/83  (!) 157/92 (!) 152/63  Pulse: 74  83 71  Resp: 17  16 17   Temp: 97.9 F (36.6 C)  97.7 F (36.5 C) 98.1 F (36.7 C)  TempSrc: Oral  Oral Oral  SpO2: 100% 99% 100% 99%  Weight:      Height:         General: slender elderly male wearing O2 NAD Head: NCAT sclera not icteric MMM dentition poor. Neck: Supple.  Lungs: diffuse bilateral inspiratory and expiatory wheezes Heart: RRR with S1 S2.  Abdomen: soft NT + BS - no epigastric tenderness Lower extremities :without edema  Neuro: A & O  X 3. Moves all extremities spontaneously. Psych:  Responds to questions appropriately with a normal affect. Dialysis Access: left upper AVGG + bruit  Dialysis Orders:  MWF [facility] ED[facility] 63.5 4hr 400/800 P 2 2K 2 Ca heparin 8000 venofer 50 Mircera 75 q 2 weeks- last 6/5  calcitriol 1.24 Ca acetate 3 ac, Usually shortens treatments variable amounts between 2.75 and 3.5 hr most of the time.  Some times late and sometimes leaves early - left upper AVGG Recent labs: hgb 7.7 trending down - last tsat 20% ferritin 211  Assessment/Plan: 1. Chest pain - cards following, trop flat - component of epigastric pain - on PPI- hx recent NSAID use; Echo EF 50%- needs stress test.  2. ESRD -  MWF - partial tmt Wed - labs ok - no acute need for HD today. Given high inpatinet dialysis census - plan HD first round Friday and titrate EDW down further- needs lower heparin dose at d/c 3. Hypertension/volume  -BP high - needs  volume down titrate down - continue current meds- favor challenging volume- he had much lower BP in March at dialysis on same meds which would suggest volume needs to go down some and coreg 6.25 bid started by cards - if BP doesn't come down with HD, consider adding additional meds 4. Anemia  - hgb 7.7- trending down - last tsat 20% > 28% here ferritin 211 - replete Fe - check FOBT ordered - increase ESA next dose 5. Metabolic bone disease -  Continue calcitriol/binders ^ to 3 ac 6. Nutrition - renal diet/multivit 7. Alcohol abuse - by his report quit 2 months ago 8. COPD - per primary- nebs/solumedrol  9. PAF - not a candidate for anticoagulation due to polysubstance abuse hx - current NSR  Myriam Jacobson, PA-C [place] [phone] 03/01/2020, 12:30 PM    I have seen and examined this patient and agree with plan and assessment in the above note with renal recommendations/intervention highlighted.  He is currently chest pain free but exam significant for bilateral wheezing and tightness.  RN to set up breathing treatments.  Agree with plan for HD tomorrow and UF as tolerated.  Await further cardiac workup.   Broadus John  A Aalaiyah Yassin,MD 03/01/2020 1:43 PM

## 2020-03-01 NOTE — Telephone Encounter (Signed)
New message   Per Daleen Snook scheduled a TOC appt with Truitt Merle at 2:15 pm on 03/21/20.

## 2020-03-01 NOTE — Progress Notes (Signed)
  Echocardiogram 2D Echocardiogram has been performed.  Anthony [name] 03/01/2020, 9:39 AM

## 2020-03-01 NOTE — Progress Notes (Signed)
SATURATION QUALIFICATIONS: (This note is used to comply with regulatory documentation for home oxygen)  Patient Saturations on Room Air at Rest = 99%  Patient Saturations on Room Air while Ambulating = 99%  This was only checked with patient ambulating to chair. He could not tolerate any further ambulation due to shortness of breath.

## 2020-03-01 NOTE — Discharge Instructions (Signed)
° °  You have a Stress Test scheduled at [facility]. Your doctor has ordered this test to check the blood flow in your heart arteries. ° °Please arrive 15 minutes early for paperwork. The whole test will take several hours. You may want to bring reading material to remain occupied while undergoing different parts of the test. ° °Instructions: °· No food/drink after midnight the night before. °· It is OK to take your morning meds with a sip of water EXCEPT for those types of medicines listed below or otherwise instructed. °· No caffeine/decaf products 24 hours before, including medicines such as Excedrin or Goody Powders. Call if there are any questions.  °· Wear comfortable clothes and shoes.  ° ° °What To Expect: °When you arrive in the lab, the technician will inject a small amount of radioactive tracer into your arm through an IV while you are resting quietly. This helps us to form pictures of your heart. You will likely only feel a sting from the IV. After a waiting period, resting pictures will be obtained under a big camera. These are the "before" pictures. ° °Next, you will be prepped for the stress portion of the test. This may include either walking on a treadmill or receiving a medicine that helps to dilate blood vessels in your heart to simulate the effect of exercise on your heart. If you are walking on a treadmill, you will walk at different paces to try to get your heart rate to a goal number that is based on your age. If your doctor has chosen the pharmacologic test, then you will receive a medicine through your IV that may cause temporary nausea, flushing, shortness of breath and sometimes chest discomfort or vomiting. This is typically short-lived and usually resolves quickly. If you experience symptoms, that does not automatically mean the test is abnormal. Some patients do not experience any symptoms at all. Your blood pressure and heart rate will be monitored, and we will  be watching your EKG on a computer screen for any changes. During this portion of the test, the radiologist will inject another small amount of radioactive tracer into your IV. After a waiting period, you will undergo a second set of pictures. These are the "after" pictures. ° °The doctor reading the test will compare the before-and-after images to look for evidence of heart blockages or heart weakness. The test usually takes 1 day to complete, but in certain instances (for example, if a patient is over a certain weight limit), the test may be done over the span of 2 days. ° ° °

## 2020-03-02 LAB — CBC
HCT: 24.2 % — ABNORMAL LOW (ref 39.0–52.0)
Hemoglobin: 7.4 g/dL — ABNORMAL LOW (ref 13.0–17.0)
MCH: 27.4 pg (ref 26.0–34.0)
MCHC: 30.6 g/dL (ref 30.0–36.0)
MCV: 89.6 fL (ref 80.0–100.0)
Platelets: 237 10*3/uL (ref 150–400)
RBC: 2.7 MIL/uL — ABNORMAL LOW (ref 4.22–5.81)
RDW: 17.5 % — ABNORMAL HIGH (ref 11.5–15.5)
WBC: 4.2 10*3/uL (ref 4.0–10.5)
nRBC: 0 % (ref 0.0–0.2)

## 2020-03-02 LAB — RENAL FUNCTION PANEL
Albumin: 3.4 g/dL — ABNORMAL LOW (ref 3.5–5.0)
Anion gap: 17 — ABNORMAL HIGH (ref 5–15)
BUN: 65 mg/dL — ABNORMAL HIGH (ref 8–23)
CO2: 25 mmol/L (ref 22–32)
Calcium: 9 mg/dL (ref 8.9–10.3)
Chloride: 95 mmol/L — ABNORMAL LOW (ref 98–111)
Creatinine, Ser: 12.04 mg/dL — ABNORMAL HIGH (ref 0.61–1.24)
GFR calc Af Amer: 4 mL/min — ABNORMAL LOW (ref >60)
GFR calc non Af Amer: 4 mL/min — ABNORMAL LOW (ref >60)
Glucose, Bld: 135 mg/dL — ABNORMAL HIGH (ref 70–99)
Phosphorus: 7 mg/dL — ABNORMAL HIGH (ref 2.5–4.6)
Potassium: 4.8 mmol/L (ref 3.5–5.1)
Sodium: 137 mmol/L (ref 135–145)

## 2020-03-02 MED ORDER — CALCITRIOL 0.25 MCG PO CAPS
ORAL_CAPSULE | ORAL | Status: AC
  Filled 2020-03-02: qty 5

## 2020-03-02 MED ORDER — CARVEDILOL 6.25 MG PO TABS: 6.2500 mg | ORAL_TABLET | Freq: Two times a day (BID) | ORAL | 0 refills | Status: DC

## 2020-03-02 MED ORDER — IPRATROPIUM-ALBUTEROL 0.5-2.5 (3) MG/3ML IN SOLN
3.0000 mL | Freq: Four times a day (QID) | RESPIRATORY_TRACT | Status: DC
  Administered 2020-03-02: 3 mL via RESPIRATORY_TRACT
  Filled 2020-03-02: qty 3

## 2020-03-02 MED ORDER — PREDNISONE 10 MG PO TABS
ORAL_TABLET | ORAL | 0 refills | Status: DC
Start: 2020-03-02 — End: 2020-03-20

## 2020-03-02 MED ORDER — DOXYCYCLINE HYCLATE 100 MG PO TABS
100.0000 mg | ORAL_TABLET | Freq: Two times a day (BID) | ORAL | Status: DC
  Administered 2020-03-02: 100 mg via ORAL
  Filled 2020-03-02: qty 1

## 2020-03-02 MED ORDER — METHYLPREDNISOLONE SODIUM SUCC 125 MG IJ SOLR
60.0000 mg | Freq: Four times a day (QID) | INTRAMUSCULAR | Status: DC
  Administered 2020-03-02: 60 mg via INTRAVENOUS
  Filled 2020-03-02: qty 2

## 2020-03-02 MED ORDER — DOXYCYCLINE HYCLATE 100 MG PO TABS: 100.0000 mg | ORAL_TABLET | Freq: Two times a day (BID) | ORAL | 0 refills | Status: AC

## 2020-03-02 MED ORDER — ALBUTEROL SULFATE HFA 108 (90 BASE) MCG/ACT IN AERS: 2.0000 | INHALATION_SPRAY | Freq: Four times a day (QID) | RESPIRATORY_TRACT | 0 refills | Status: DC | PRN

## 2020-03-02 NOTE — Progress Notes (Signed)
[facility] Progress Note   Subjective:   Seen on HD. No further chest pain.    Objective Vitals:   03/01/20 2032 03/01/20 2058 03/02/20 0000 03/02/20 0400  BP: (!) 170/85  (!) 153/85 (!) 155/74  Pulse: 75  75 77  Resp: 17  20 18   Temp: 98.3 F (36.8 C)  97.9 F (36.6 C) 98.1 F (36.7 C)  TempSrc: Oral  Oral Oral  SpO2: 100% 99% 98% 98%  Weight:      Height:       Physical Exam General: nontoxic lying in bed Heart: RRR Lungs: wheezing diffusely Abdomen: soft, thin Extremities: no edema Dialysis Access:  RUE AVF Qb 400  Additional Objective Labs: Basic Metabolic Panel: Recent Labs  Lab 02/29/20 1408 02/29/20 1645  NA 140  --   K 3.7  --   CL 99  --   CO2 25  --   GLUCOSE 101*  --   BUN 26*  --   CREATININE 7.92* 8.56*  CALCIUM 8.3*  --   PHOS  --  4.1   Liver Function Tests: Recent Labs  Lab 02/29/20 1408  AST 25  ALT 20  ALKPHOS 74  BILITOT 0.8  PROT 7.3  ALBUMIN 3.5   No results for input(s): LIPASE, AMYLASE in the last 168 hours. CBC: Recent Labs  Lab 02/29/20 1408 03/01/20 0128 03/02/20 0758  WBC 4.8 2.3* 4.2  NEUTROABS 3.4  --   --   HGB 8.3* 7.7* 7.4*  HCT 28.1* 24.2* 24.2*  MCV 93.7 89.0 89.6  PLT 205 180 237   Blood Culture No results found for: SDES, SPECREQUEST, CULT, REPTSTATUS  Cardiac Enzymes: No results for input(s): CKTOTAL, CKMB, CKMBINDEX, TROPONINI in the last 168 hours. CBG: No results for input(s): GLUCAP in the last 168 hours. Iron Studies:  Recent Labs    03/01/20 0128  IRON 69  TIBC 239*  FERRITIN 68   @[email or web address] @ Studies/Results: DG Chest Portable 1 View  Result Date: 02/29/2020 CLINICAL DATA:  Cough and shortness of breath. EXAM: PORTABLE CHEST 1 VIEW COMPARISON:  02/14/2020 FINDINGS: Borderline cardiomegaly with unchanged mediastinal contours. Aortic atherosclerosis. No pulmonary edema. Mild hyperinflation which appears similar to prior. No focal airspace disease, pleural effusion, or  pneumothorax. No acute osseous abnormalities are seen. IMPRESSION: 1. No acute abnormality. 2. Borderline cardiomegaly, likely accentuated by portable technique. 3. Hyperinflation, unchanged from prior. Electronically Signed   By: Keith Rake M.D.   On: 02/29/2020 14:40   ECHOCARDIOGRAM COMPLETE  Result Date: 03/01/2020    ECHOCARDIOGRAM REPORT   Patient Name:   Anthony [name] Date of Exam: 03/01/2020 Medical Rec #:  [id number]      Height:       68.0 in Accession #:    [id number]     Weight:       145.5 lb Date of Birth:  Apr 12, 1941       BSA:          1.785 m Patient Age:    79 years       BP:           157/92 mmHg Patient Gender: M              HR:           87 bpm. Exam Location:  Inpatient Procedure: 2D Echo, Cardiac Doppler and Color Doppler             STAT ECHO Reported to: Dr. Skeet Latch.  MODIFIED REPORT: This report was modified by Skeet Latch MD on 03/01/2020 due to IVC dilated.  Indications:     R07.9* Chest pain, unspecified  History:         Patient has no prior history of Echocardiogram examinations.                  Stroke; Risk Factors:Hypertension and Current Smoker. CKD.                  Alcohol abuse.  Sonographer:     Jonelle Sidle Dance Referring Phys:  [id number] Tami Lin [facility] Diagnosing Phys: Skeet Latch MD IMPRESSIONS  1. Left ventricular ejection fraction, by estimation, is 50 to 55%. The left ventricle has low normal function. The left ventricle has no regional wall motion abnormalities. The left ventricular internal cavity size was mildly dilated. There is mild concentric left ventricular hypertrophy. Left ventricular diastolic parameters are consistent with Grade II diastolic dysfunction (pseudonormalization).  2. Right ventricular systolic function is normal. The right ventricular size is normal. There is moderately elevated pulmonary artery systolic pressure.  3. Left atrial size was severely dilated.  4. Right atrial size was moderately  dilated.  5. The mitral valve is normal in structure. Trivial mitral valve regurgitation. No evidence of mitral stenosis.  6. The aortic valve is normal in structure. Aortic valve regurgitation is not visualized. No aortic stenosis is present.  7. The inferior vena cava is dilated in size with <50% respiratory variability, suggesting right atrial pressure of 15 mmHg. FINDINGS  Left Ventricle: Left ventricular ejection fraction, by estimation, is 50 to 55%. The left ventricle has low normal function. The left ventricle has no regional wall motion abnormalities. The left ventricular internal cavity size was mildly dilated. There is mild concentric left ventricular hypertrophy. Left ventricular diastolic parameters are consistent with Grade II diastolic dysfunction (pseudonormalization). Right Ventricle: The right ventricular size is normal. No increase in right ventricular wall thickness. Right ventricular systolic function is normal. There is moderately elevated pulmonary artery systolic pressure. The tricuspid regurgitant velocity is 3.09 m/s, and with an assumed right atrial pressure of 15 mmHg, the estimated right ventricular systolic pressure is 19.4 mmHg. Left Atrium: Left atrial size was severely dilated. Right Atrium: Right atrial size was moderately dilated. Pericardium: There is no evidence of pericardial effusion. Mitral Valve: The mitral valve is normal in structure. Normal mobility of the mitral valve leaflets. Mild mitral annular calcification. Trivial mitral valve regurgitation. No evidence of mitral valve stenosis. Tricuspid Valve: The tricuspid valve is normal in structure. Tricuspid valve regurgitation is trivial. No evidence of tricuspid stenosis. Aortic Valve: The aortic valve is normal in structure.. There is mild thickening and mild calcification of the aortic valve. Aortic valve regurgitation is not visualized. No aortic stenosis is present. There is mild thickening of the aortic valve. There is  mild calcification of the aortic valve. Pulmonic Valve: The pulmonic valve was normal in structure. Pulmonic valve regurgitation is not visualized. No evidence of pulmonic stenosis. Aorta: The aortic root is normal in size and structure. Venous: The inferior vena cava is dilated in size with less than 50% respiratory variability, suggesting right atrial pressure of 15 mmHg. IAS/Shunts: No atrial level shunt detected by color flow Doppler.  LEFT VENTRICLE PLAX 2D LVIDd:         5.80 cm LVIDs:         4.90 cm LV PW:         1.20 cm LV IVS:  1.20 cm LVOT diam:     2.20 cm LV SV:         106 LV SV Index:   60 LVOT Area:     3.80 cm  RIGHT VENTRICLE             IVC RV Basal diam:  3.60 cm     IVC diam: 2.50 cm RV Mid diam:    2.20 cm RV S prime:     14.60 cm/s TAPSE (M-mode): 2.4 cm LEFT ATRIUM              Index       RIGHT ATRIUM           Index LA diam:        5.00 cm  2.80 cm/m  RA Area:     20.40 cm LA Vol (A2C):   136.0 ml 76.18 ml/m RA Volume:   61.50 ml  34.45 ml/m LA Vol (A4C):   109.0 ml 61.05 ml/m LA Biplane Vol: 124.0 ml 69.46 ml/m  AORTIC VALVE LVOT Vmax:   140.00 cm/s LVOT Vmean:  85.350 cm/s LVOT VTI:    0.280 m  AORTA Ao Root diam: 3.00 cm Ao Asc diam:  3.30 cm MITRAL VALVE                TRICUSPID VALVE MV Area (PHT): 2.83 cm     TR Peak grad:   38.2 mmHg MV Decel Time: 268 msec     TR Vmax:        309.00 cm/s MV E velocity: 150.00 cm/s MV A velocity: 107.00 cm/s  SHUNTS MV E/A ratio:  1.40         Systemic VTI:  0.28 m                             Systemic Diam: 2.20 cm Skeet Latch MD Electronically signed by Skeet Latch MD Signature Date/Time: 03/01/2020/10:17:43 AM    Final (Updated)    Medications: . ferric gluconate (FERRLECIT/NULECIT) IV 125 mg (03/02/20 2671)   . calcitRIOL  1.25 mcg Oral Q M,W,F-HD  . calcium acetate  2,001 mg Oral TID WC  . carvedilol  6.25 mg Oral BID WC  . Chlorhexidine Gluconate Cloth  6 each Topical Q0600  . hydrALAZINE  100 mg Oral TID  .  ipratropium-albuterol  3 mL Nebulization Q6H  . methylPREDNISolone (SOLU-MEDROL) injection  60 mg Intravenous Q6H  . multivitamin  1 tablet Oral QHS  . pantoprazole (PROTONIX) IV  40 mg Intravenous Q12H  . traZODone  50 mg Oral QHS    Dialysis Orders:  MWF [facility] ED[facility] 63.5 4hr 400/800 P 2 2K 2 Ca heparin 8000 venofer 50 Mircera 75 q 2 weeks- last 6/5  calcitriol 1.24 Ca acetate 3 ac, Usually shortens treatments variable amounts between 2.75 and 3.5 hr most of the time.  Some times late and sometimes leaves early - left upper AVGG Recent labs: hgb 7.7 trending down - last tsat 20% ferritin 211  Assessment/Plan: 1. Chest pain - cards following, trop flat - component of epigastric pain - on PPI- hx recent NSAID use; Echo EF 50%- needs stress test but with COPD exacerbation this has been schedule for 2 wks from now.   2. ESRD -  MWF - partial tmt Wed - HD today challenging EDW, needs post weight. needs lower heparin dose at d/c 3. Hypertension/volume  -BP high - needs volume down  titrate down - continue current meds- challenging volume- he had much lower BP in March at dialysis on same meds which would suggest volume needs to go down some and coreg 6.25 bid started by cards - if BP doesn't come down with HD, consider adding additional meds 4. Anemia  - hgb 7.7- trending down - last tsat 20% > 28% here ferritin 211 - replete Fe - check FOBT ordered - increase ESA next dose 5. Metabolic bone disease -  Continue calcitriol/binders ^ to 3 ac 6. Nutrition - renal diet/multivit 7. Alcohol abuse - by his report quit 2 months ago 8. COPD - per primary- nebs/solumedrol, still very wheezy 9. PAF - not  Considered a candidate for anticoagulation due to polysubstance abuse hx - current NSR  Would be ok for discharge from nephrology perspective today if pulmonary status with COPD would allow.  Jannifer Hick MD 03/02/2020, 9:07 AM  [facility] Pager: [phone]

## 2020-03-02 NOTE — Progress Notes (Signed)
PT Cancellation Note  Patient Details Name: Anthony [name] MRN: [id number] DOB: 05/10/1941   Cancelled Treatment:    Reason Eval/Treat Not Completed: Patient at procedure or test/unavailable (HD). PT will continue to f/u with pt acutely as available.    [place] 03/02/2020, 7:32 AM

## 2020-03-02 NOTE — Progress Notes (Signed)
Pt given discharge summary. Awaiting pt's ride.

## 2020-03-02 NOTE — Evaluation (Signed)
Physical Therapy Evaluation Patient Details Name: Anthony [name] MRN: [id number] DOB: [birth year] Today's Date: 03/02/2020   History of Present Illness  Pt is a 79 y/o male admitted secondary to chest pain. Cardiology was consulted and recommend improved blood pressure control and aggressive treatment of COPD exacerbation, no events on tele. PMH including but not limited to HTN, CVA.    Clinical Impression  Pt presented supine in bed with HOB elevated, awake and willing to participate in therapy session. Prior to admission, pt reported that he was independent with all functional mobility and ADLs. Pt lives with his wife in a single level home with a few steps to enter. At the time of evaluation, pt overall at a mod I to supervision level for functional mobility including hallway ambulation without use of an AD. Pt on RA throughout with SpO2 maintaining >93%. HR was stable in the 90's. Of note, pt with increased WOB and audible difficulty breathing; however, VSS. Pt's RN was notified. PT will continue to follow pt acutely to progress mobility as tolerated.     Follow Up Recommendations No PT follow up    Equipment Recommendations  None recommended by PT    Recommendations for Other Services       Precautions / Restrictions Precautions Precautions: Fall Restrictions Weight Bearing Restrictions: No      Mobility  Bed Mobility Overal bed mobility: Modified Independent                Transfers Overall transfer level: Modified independent Equipment used: None                Ambulation/Gait Ambulation/Gait assistance: Min guard;Supervision Gait Distance (Feet): 150 Feet Assistive device: None Gait Pattern/deviations: Step-through pattern;Decreased stride length Gait velocity: decreased   General Gait Details: pt with mild instability but no overt LOB or need for physical assistance, min guard and progressing to supervision level with hallway ambulation  Stairs             Wheelchair Mobility    Modified Rankin (Stroke Patients Only)       Balance Overall balance assessment: Needs assistance Sitting-balance support: Feet supported Sitting balance-Leahy Scale: Good     Standing balance support: During functional activity;No upper extremity supported Standing balance-Leahy Scale: Fair                               Pertinent Vitals/Pain Pain Assessment: No/denies pain    Home Living Family/patient expects to be discharged to:: Private residence Living Arrangements: Spouse/significant other Available Help at Discharge: Family Type of Home: House Home Access: Stairs to enter     Home Layout: One level Home Equipment: Environmental consultant - 2 wheels;Cane - single point      Prior Function Level of Independence: Independent         Comments: uses supplemental O2 PRN     Hand Dominance        Extremity/Trunk Assessment   Upper Extremity Assessment Upper Extremity Assessment: Overall WFL for tasks assessed    Lower Extremity Assessment Lower Extremity Assessment: Overall WFL for tasks assessed       Communication   Communication: No difficulties  Cognition Arousal/Alertness: Awake/alert Behavior During Therapy: WFL for tasks assessed/performed Overall Cognitive Status: Within Functional Limits for tasks assessed  General Comments      Exercises     Assessment/Plan    PT Assessment Patient needs continued PT services  PT Problem List Decreased mobility;Cardiopulmonary status limiting activity       PT Treatment Interventions DME instruction;Gait training;Stair training;Functional mobility training;Therapeutic activities;Therapeutic exercise;Balance training;Neuromuscular re-education;Patient/family education    PT Goals (Current goals can be found in the Care Plan section)  Acute Rehab PT Goals Patient Stated Goal: "home tonight" PT Goal Formulation:  With patient Time For Goal Achievement: 03/16/20 Potential to Achieve Goals: Good    Frequency Min 3X/week   Barriers to discharge        Co-evaluation               AM-PAC PT "6 Clicks" Mobility  Outcome Measure Help needed turning from your back to your side while in a flat bed without using bedrails?: None Help needed moving from lying on your back to sitting on the side of a flat bed without using bedrails?: None Help needed moving to and from a bed to a chair (including a wheelchair)?: None Help needed standing up from a chair using your arms (e.g., wheelchair or bedside chair)?: None Help needed to walk in hospital room?: None Help needed climbing 3-5 steps with a railing? : A Little 6 Click Score: 23    End of Session   Activity Tolerance: Patient tolerated treatment well Patient left: in bed;with call bell/phone within reach;with bed alarm set;Other (comment) (sitting EOB) Nurse Communication: Mobility status PT Visit Diagnosis: Other abnormalities of gait and mobility (R26.89)    Time: 2878-6767 PT Time Calculation (min) (ACUTE ONLY): 19 min   Charges:   PT Evaluation $PT Eval Moderate Complexity: 1 Mod          Eduard Clos, PT, DPT  Acute Rehabilitation Services Pager [phone] Office [place] 03/02/2020, 4:58 PM

## 2020-03-02 NOTE — Discharge Summary (Signed)
Physician Discharge Summary  Jervis Trapani HQI:[id number] DOB: [birth year] DOA: 02/29/2020  PCP: Isaac Bliss, Rayford Halsted, MD  Admit date: 02/29/2020 Discharge date: 03/02/2020  Admitted From: home Discharge disposition: home   Recommendations for Outpatient Follow-Up:   1. Follow up with PCP 1-2 weeks for evaluation of respiratory status, volume status, BP control, anemia 2. Has appointment Bruce Donath 03/21/20 at 2:15pm 3. Take medications as prescribed 4. Follow HD schedule   Discharge Diagnosis:   Principal Problem:   Chest pain Active Problems:   Anemia of chronic disease   Hypertensive urgency   Elevated troponin   Chronic diastolic heart failure (HCC)   COPD exacerbation (HCC)   ESRD (end stage renal disease) on dialysis (HCC)   PAF (paroxysmal atrial fibrillation) ([place])   Tobacco use disorder    Discharge Condition: Improved.  Diet recommendation: Low sodium, heart healthy.   Wound care: None.  Code status: Full.   History of Present Illness:   Anthony [name] is a 79 y.o. male with medical history significant of ESRD on hemodialysis (MWF), hypertension, CVA with no residual weakness, anemia of chronic disease, tobacco abuse presented to emergency department 02/29/20 due to chest pain and shortness of breath since 2 to 3 weeks.  Patient reported epigastric/midsternal chest pain since 2 to 3 weeks.  That morning his symptoms started while he was receiving dialysis.  EMS was called and brought patient to the emergency department for further evaluation and management.  He reported his pain  epigastric/midsternal, 8 out of 10, nonradiating, associated with orthopnea, PND, productive cough with greenish sputum.  Denied association with diaphoresis, nausea, vomiting, palpitation, leg swelling, abdominal pain, urinary symptoms, fever, chills, recent sick contact.  He had been taking over-the-counter NSAID for chest pain.  He reported that he never had such type of  chest pain before.  No melena, hematemesis, urinary symptoms.  He reported that he had a colonoscopy a year ago which was negative?Marland Kitchen He continues to smoke 1 pack of cigarette/3 days, no history of alcohol, illicit drug use.  He lives with his wife at home.  He is compliant with his home medication as well as dialysis appointment.  He recently moved to [place] 2 to 3 months ago.  ED Course: Upon arrival to ED: Patient blood pressure elevated, afebrile with no leukocytosis.  BNP: > 4500, troponin: 326, CBC shows H&H of 8.3/28.1, COVID-19 negative, chest x-ray shows borderline cardiomegaly.  EDP consulted cardiology who recommended nuclear stress test.  [facility] consulted for admission for chest pain and shortness of breath.    Hospital Course by Problem:   #1.  Chest pain/elevated troponin.  Typical and atypical features.  EKG shows LVH, hs troponin 326>310>285, 2D echo with an EF of 50% and grade 2 diastolic dysfunction.  Evaluated by cardiology who opine elevated troponins and LVH on EKG may be related to poorly controlled hypertension and demand ischemia in the setting of hypertensive urgency.  Recommended stress test that was canceled  due to COPD exacerbation.  They also note that his chest pain is of 2 types epigastric and upper chest near the throat.  Recommend improved blood pressure control and aggressive treatment of COPD exacerbation. No events on tele. Pain free at discharge. Of note, follow up apt with cards scheduled 03/21/20.  -monitor  #2.  Hypertensive urgency.  Blood pressure improved but remains on the high end of normal.  Home medications include hydralazine 100 mg 3 times daily.  Echo as noted  above.  Evaluated by cardiology recommend carvedilol 6.25 mg twice daily. Recommend follow up with PCP 1 week for evaluation of BP. He was dialyzed on day of discharge and BP somewhat improved but may need a third agent.   #3.  COPD exacerbation. Improved at discharge.Chest  xray with no acute abnormality, bordelrine cardiomegaly and hyperinflation. Home meds do include an inhaler but he reports "it has run out".   He continues to smoke.  He denies ever being told he had COPD. He was ill this past winter (in [place]) with covid 19 vs pneumonia. Wife reports continued sob and cough since then and he continues to smoke.  Oxygen saturation level greater than 90% on room air with ambulation but does have some DOE. He received scheduled nebs, solumedrol, flutter valve. He was dialyzed on day of discharge.  Will discharge with prednisone 50mg  po for 3 days, doxy and inhaler. Recommend follow up with PCP 1 week for evaluation of respiratory status.  #4.  Anemia of chronic disease.  Hemoglobin 8.3 on admission and 7.7 at discharge.  He is got a history of EtOH.  Presented with epigastric pain.  Chart review indicates he had a colonoscopy in [place] in 2020 revealing 2 polyps that were removed, colon with diverticulosis and nonbleeding internal and external hemorrhoids. FOBT negative x1. Increase ESA per nephrology  #5.  PAF.  EKG with normal sinus rhythm.  Cardiology started beta-blocker this morning.  Chart review indicates not on anticoagulation due to history of polysubstance abuse and GI bleed.  See #4  #6.  Chronic diastolic heart failure.  He is a Monday Wednesday Friday dialysis patient. Echo reveals an EF of 17% grade 2 diastolic dysfunction. Dialyzed x2 while in hospital. neprhology not indicates volume needs to come down.   #7. End-stage renal disease on dialysis. dialysis x2 during this hospitalization. Continue dialyis schedule    Medical Consultants:   Turner cards [facility] nephrology   Discharge Exam:   Vitals:   03/02/20 1130 03/02/20 1235  BP: (!) 176/99 (!) 169/92  Pulse: 84 (!) 110  Resp: 18 20  Temp:    SpO2:  100%   Vitals:   03/02/20 1030 03/02/20 1100 03/02/20 1130 03/02/20 1235  BP: (!) 146/98 (!) 164/101 (!) 176/99  (!) 169/92  Pulse: 72 (!) 107 84 (!) 110  Resp: 18 18 18 20   Temp:      TempSrc:      SpO2:    100%  Weight:      Height:        General exam: Appears calm and comfortable. No acute distress Respiratory system:  Respiratory effort normal. BS with good air movement, faint scattered wheezes with diffuse rhonchi. No crackles Cardiovascular system: S1 & S2 heard, RRR. No JVD,  rubs, gallops or clicks. No murmurs. Gastrointestinal system: Abdomen is nondistended, soft and nontender. No organomegaly or masses felt. Normal bowel sounds heard. Central nervous system: Alert and oriented. No focal neurological deficits. Extremities: No clubbing,  or cyanosis. No edema. Skin: No rashes, lesions or ulcers. Psychiatry: Judgement and insight appear normal. Mood & affect appropriate.    The results of significant diagnostics from this hospitalization (including imaging, microbiology, ancillary and laboratory) are listed below for reference.     Procedures and Diagnostic Studies:   DG Chest Portable 1 View  Result Date: 02/29/2020 CLINICAL DATA:  Cough and shortness of breath. EXAM: PORTABLE CHEST 1 VIEW COMPARISON:  02/14/2020 FINDINGS: Borderline cardiomegaly with unchanged mediastinal  contours. Aortic atherosclerosis. No pulmonary edema. Mild hyperinflation which appears similar to prior. No focal airspace disease, pleural effusion, or pneumothorax. No acute osseous abnormalities are seen. IMPRESSION: 1. No acute abnormality. 2. Borderline cardiomegaly, likely accentuated by portable technique. 3. Hyperinflation, unchanged from prior. Electronically Signed   By: Keith Rake M.D.   On: 02/29/2020 14:40   ECHOCARDIOGRAM COMPLETE  Result Date: 03/01/2020    ECHOCARDIOGRAM REPORT   Patient Name:   RAYNER ERMAN Date of Exam: 03/01/2020 Medical Rec #:  [id number]      Height:       68.0 in Accession #:    [id number]     Weight:       145.5 lb Date of Birth:  1940-10-13       BSA:          1.785 m  Patient Age:    19 years       BP:           157/92 mmHg Patient Gender: M              HR:           87 bpm. Exam Location:  Inpatient Procedure: 2D Echo, Cardiac Doppler and Color Doppler             STAT ECHO Reported to: Dr. Skeet Latch.                                 MODIFIED REPORT: This report was modified by Skeet Latch MD on 03/01/2020 due to IVC dilated.  Indications:     R07.9* Chest pain, unspecified  History:         Patient has no prior history of Echocardiogram examinations.                  Stroke; Risk Factors:Hypertension and Current Smoker. CKD.                  Alcohol abuse.  Sonographer:     Jonelle Sidle Dance Referring Phys:  [id number] Tami Lin [facility] Diagnosing Phys: Skeet Latch MD IMPRESSIONS  1. Left ventricular ejection fraction, by estimation, is 50 to 55%. The left ventricle has low normal function. The left ventricle has no regional wall motion abnormalities. The left ventricular internal cavity size was mildly dilated. There is mild concentric left ventricular hypertrophy. Left ventricular diastolic parameters are consistent with Grade II diastolic dysfunction (pseudonormalization).  2. Right ventricular systolic function is normal. The right ventricular size is normal. There is moderately elevated pulmonary artery systolic pressure.  3. Left atrial size was severely dilated.  4. Right atrial size was moderately dilated.  5. The mitral valve is normal in structure. Trivial mitral valve regurgitation. No evidence of mitral stenosis.  6. The aortic valve is normal in structure. Aortic valve regurgitation is not visualized. No aortic stenosis is present.  7. The inferior vena cava is dilated in size with <50% respiratory variability, suggesting right atrial pressure of 15 mmHg. FINDINGS  Left Ventricle: Left ventricular ejection fraction, by estimation, is 50 to 55%. The left ventricle has low normal function. The left ventricle has no regional wall motion abnormalities. The  left ventricular internal cavity size was mildly dilated. There is mild concentric left ventricular hypertrophy. Left ventricular diastolic parameters are consistent with Grade II diastolic dysfunction (pseudonormalization). Right Ventricle: The right ventricular size is normal. No increase in right ventricular wall thickness. Right  ventricular systolic function is normal. There is moderately elevated pulmonary artery systolic pressure. The tricuspid regurgitant velocity is 3.09 m/s, and with an assumed right atrial pressure of 15 mmHg, the estimated right ventricular systolic pressure is 10.2 mmHg. Left Atrium: Left atrial size was severely dilated. Right Atrium: Right atrial size was moderately dilated. Pericardium: There is no evidence of pericardial effusion. Mitral Valve: The mitral valve is normal in structure. Normal mobility of the mitral valve leaflets. Mild mitral annular calcification. Trivial mitral valve regurgitation. No evidence of mitral valve stenosis. Tricuspid Valve: The tricuspid valve is normal in structure. Tricuspid valve regurgitation is trivial. No evidence of tricuspid stenosis. Aortic Valve: The aortic valve is normal in structure.. There is mild thickening and mild calcification of the aortic valve. Aortic valve regurgitation is not visualized. No aortic stenosis is present. There is mild thickening of the aortic valve. There is mild calcification of the aortic valve. Pulmonic Valve: The pulmonic valve was normal in structure. Pulmonic valve regurgitation is not visualized. No evidence of pulmonic stenosis. Aorta: The aortic root is normal in size and structure. Venous: The inferior vena cava is dilated in size with less than 50% respiratory variability, suggesting right atrial pressure of 15 mmHg. IAS/Shunts: No atrial level shunt detected by color flow Doppler.  LEFT VENTRICLE PLAX 2D LVIDd:         5.80 cm LVIDs:         4.90 cm LV PW:         1.20 cm LV IVS:        1.20 cm LVOT diam:      2.20 cm LV SV:         106 LV SV Index:   60 LVOT Area:     3.80 cm  RIGHT VENTRICLE             IVC RV Basal diam:  3.60 cm     IVC diam: 2.50 cm RV Mid diam:    2.20 cm RV S prime:     14.60 cm/s TAPSE (M-mode): 2.4 cm LEFT ATRIUM              Index       RIGHT ATRIUM           Index LA diam:        5.00 cm  2.80 cm/m  RA Area:     20.40 cm LA Vol (A2C):   136.0 ml 76.18 ml/m RA Volume:   61.50 ml  34.45 ml/m LA Vol (A4C):   109.0 ml 61.05 ml/m LA Biplane Vol: 124.0 ml 69.46 ml/m  AORTIC VALVE LVOT Vmax:   140.00 cm/s LVOT Vmean:  85.350 cm/s LVOT VTI:    0.280 m  AORTA Ao Root diam: 3.00 cm Ao Asc diam:  3.30 cm MITRAL VALVE                TRICUSPID VALVE MV Area (PHT): 2.83 cm     TR Peak grad:   38.2 mmHg MV Decel Time: 268 msec     TR Vmax:        309.00 cm/s MV E velocity: 150.00 cm/s MV A velocity: 107.00 cm/s  SHUNTS MV E/A ratio:  1.40         Systemic VTI:  0.28 m                             Systemic Diam: 2.20 cm Tiffany  Oval Linsey MD Electronically signed by Skeet Latch MD Signature Date/Time: 03/01/2020/10:17:43 AM    Final (Updated)      Labs:   Basic Metabolic Panel: Recent Labs  Lab 02/29/20 1408 02/29/20 1645 03/02/20 0758  NA 140  --  137  K 3.7  --  4.8  CL 99  --  95*  CO2 25  --  25  GLUCOSE 101*  --  135*  BUN 26*  --  65*  CREATININE 7.92* 8.56* 12.04*  CALCIUM 8.3*  --  9.0  MG  --  2.2  --   PHOS  --  4.1 7.0*   GFR Estimated Creatinine Clearance: 4.7 mL/min (A) (by C-G formula based on SCr of 12.04 mg/dL (H)). Liver Function Tests: Recent Labs  Lab 02/29/20 1408 03/02/20 0758  AST 25  --   ALT 20  --   ALKPHOS 74  --   BILITOT 0.8  --   PROT 7.3  --   ALBUMIN 3.5 3.4*   No results for input(s): LIPASE, AMYLASE in the last 168 hours. No results for input(s): AMMONIA in the last 168 hours. Coagulation profile No results for input(s): INR, PROTIME in the last 168 hours.  CBC: Recent Labs  Lab 02/29/20 1408 03/01/20 0128 03/02/20 0758   WBC 4.8 2.3* 4.2  NEUTROABS 3.4  --   --   HGB 8.3* 7.7* 7.4*  HCT 28.1* 24.2* 24.2*  MCV 93.7 89.0 89.6  PLT 205 180 237   Cardiac Enzymes: No results for input(s): CKTOTAL, CKMB, CKMBINDEX, TROPONINI in the last 168 hours. BNP: Invalid input(s): POCBNP CBG: No results for input(s): GLUCAP in the last 168 hours. D-Dimer No results for input(s): DDIMER in the last 72 hours. Hgb A1c No results for input(s): HGBA1C in the last 72 hours. Lipid Profile No results for input(s): CHOL, HDL, LDLCALC, TRIG, CHOLHDL, LDLDIRECT in the last 72 hours. Thyroid function studies Recent Labs    03/01/20 0128  TSH 0.379   Anemia work up Recent Labs    03/01/20 0128  FERRITIN 68  TIBC 239*  IRON 69  RETICCTPCT 2.8   Microbiology Recent Results (from the past 240 hour(s))  SARS Coronavirus 2 by RT PCR (hospital order, performed in [facility] hospital lab) Nasopharyngeal Nasopharyngeal Swab     Status: None   Collection Time: 02/29/20  2:26 PM   Specimen: Nasopharyngeal Swab  Result Value Ref Range Status   SARS Coronavirus 2 NEGATIVE NEGATIVE Final    Comment: (NOTE) SARS-CoV-2 target nucleic acids are NOT DETECTED. The SARS-CoV-2 RNA is generally detectable in upper and lower respiratory specimens during the acute phase of infection. The lowest concentration of SARS-CoV-2 viral copies this assay can detect is 250 copies / mL. A negative result does not preclude SARS-CoV-2 infection and should not be used as the sole basis for treatment or other patient management decisions.  A negative result may occur with improper specimen collection / handling, submission of specimen other than nasopharyngeal swab, presence of viral mutation(s) within the areas targeted by this assay, and inadequate number of viral copies (<250 copies / mL). A negative result must be combined with clinical observations, patient history, and epidemiological information. Fact Sheet for Patients:     StrictlyIdeas.no Fact Sheet for Healthcare Providers: BankingDealers.co.za This test is not yet approved or cleared  by the [place] FDA and has been authorized for detection and/or diagnosis of SARS-CoV-2 by FDA under an Emergency Use Authorization (EUA).  This EUA will  remain in effect (meaning this test can be used) for the duration of the COVID-19 declaration under Section 564(b)(1) of the Act, 21 U.S.C. section 360bbb-3(b)(1), unless the authorization is terminated or revoked sooner. Performed at [facility], [place] [address] [place], [place] [address]      Discharge Instructions:    Allergies as of 03/02/2020   No Known Allergies     Medication List    TAKE these medications   acetaminophen 325 MG tablet Commonly known as: TYLENOL Take 325 mg by mouth daily with breakfast.   albuterol 108 (90 Base) MCG/ACT inhaler Commonly known as: Proventil HFA Inhale 2 puffs into the lungs every 6 (six) hours as needed for wheezing or shortness of breath.   calcium acetate 667 MG capsule Commonly known as: PHOSLO Take 667 mg by mouth 3 (three) times daily with meals.   carvedilol 6.25 MG tablet Commonly known as: COREG Take 1 tablet (6.25 mg total) by mouth 2 (two) times daily with a meal.   doxycycline 100 MG tablet Commonly known as: VIBRA-TABS Take 1 tablet (100 mg total) by mouth 2 (two) times daily for 7 days.   hydrALAZINE 100 MG tablet Commonly known as: APRESOLINE Take 100 mg by mouth 3 (three) times daily.   methocarbamol 500 MG tablet Commonly known as: Robaxin Take 1 tablet (500 mg total) by mouth every 8 (eight) hours as needed for muscle spasms.   NON FORMULARY Take 1 capsule by mouth See admin instructions. Omega XL 60 Capsules - Green Lipped Mussel [place], Omega 3 Natural Joint Pain Relief & Inflammation Supplement: Take 1 capsule by mouth once a day   pantoprazole 40 MG tablet Commonly  known as: PROTONIX Take 40 mg by mouth daily before breakfast.   predniSONE 10 MG tablet Commonly known as: DELTASONE Take 5 tabs daily for 3 days starting 03/03/20 then stop   traZODone 50 MG tablet Commonly known as: DESYREL TAKE 1 TO 2 TABLETS AT BEDTIME AS NEEDED What changed:   how much to take  how to take this  when to take this  additional instructions       Follow-up Information    Burtis Junes, NP Follow up on 03/21/2020.   Specialties: Nurse Practitioner, Interventional Cardiology, Cardiology, Radiology Why: Please arrive 15 minutes early for your 2:15pm post-hospital cardiology follow-up appointment Contact information: [place] [place] [address] [phone]        [facility] Follow up on 03/13/2020.   Specialty: Cardiology Why: Please arrive 15 minutes early for your 10:30am stress test appointment. Please do not eat after midnight the night before. See your discharge instructions for more information. Contact information: [address] [place] [phone]               Time coordinating discharge: 40 minutes  Signed:  Radene Gunning NP  [facility] 03/02/2020, 1:42 PM

## 2020-03-03 ENCOUNTER — Telehealth: Payer: Self-pay | Admitting: Physician Assistant

## 2020-03-03 NOTE — Telephone Encounter (Signed)
Transition of care contact from inpatient facility  Date of Discharge: 03/02/20 Date of Contact: 03/03/20 Method of contact: Phone  Attempted to contact patient to discuss transition of care from inpatient admission. Patient did not answer the phone. Message was left on the patient's voicemail with call back number [phone].  Anice Paganini, PA-C 03/03/2020, 1:06 PM  [facility] Pager: [phone]

## 2020-03-04 ENCOUNTER — Telehealth: Payer: Self-pay | Admitting: Physician Assistant

## 2020-03-04 NOTE — Telephone Encounter (Signed)
Transition of care contact from inpatient facility  Date of Discharge: 03/02/20 Date of Contact: 03/04/20 Method of contact: Phone  Attempted to contact patient to discuss transition of care from inpatient admission. Patient did not answer the phone and unable to leave VM. Will follow up with patient at his outpatient dialysis center.   Anice Paganini, PA-C 03/04/2020, 12:59 PM  [facility] Pager: [phone]

## 2020-03-05 ENCOUNTER — Telehealth (HOSPITAL_COMMUNITY): Payer: Self-pay

## 2020-03-05 NOTE — Telephone Encounter (Signed)
Attempted to contact the patient, detailed instructions were left for him. Asked to call back with any questions. S.Kerry Odonohue EMTP

## 2020-03-05 NOTE — Telephone Encounter (Signed)
Left message for patient to call back to follow-up on his recent hospital stay.

## 2020-03-06 ENCOUNTER — Encounter (HOSPITAL_COMMUNITY): Payer: Medicare Other

## 2020-03-06 ENCOUNTER — Telehealth (HOSPITAL_COMMUNITY): Payer: Self-pay | Admitting: *Deleted

## 2020-03-06 ENCOUNTER — Telehealth: Payer: Self-pay | Admitting: Nurse Practitioner

## 2020-03-06 NOTE — Telephone Encounter (Signed)
Patient given detailed instructions per Myocardial Perfusion Study Information Sheet for the test on 03/13/20 at 10:45. Patient notified to arrive 15 minutes early and that it is imperative to arrive on time for appointment to keep from having the test rescheduled.  If you need to cancel or reschedule your appointment, please call the office within 24 hours of your appointment. . Patient verbalized understanding.Anthony [name]

## 2020-03-06 NOTE — Telephone Encounter (Signed)
**Note De-Identified  Obfuscation** No answer so I left a message asking Pamala Hurry to call Jeani Hawking back at [facility] at [phone].

## 2020-03-06 NOTE — Telephone Encounter (Signed)
**Note De-Identified  Obfuscation** Waynard Edwards A      New Message   Pts wife is returning a call    Please call back       Documentation   Jawon, Dipiero [phone]  Waynard Edwards A

## 2020-03-06 NOTE — Telephone Encounter (Signed)
New Message ° ° ° °Pts wife is returning a call  ° °Please call back  °

## 2020-03-06 NOTE — Telephone Encounter (Signed)
Follow up  Patient returning call. Transferred to nuclear dept.

## 2020-03-06 NOTE — Telephone Encounter (Signed)
**Note De-Identified  Obfuscation** See TCM Call from 6/10 for details.

## 2020-03-06 NOTE — Telephone Encounter (Signed)
**Note De-Identified  Obfuscation** TCM Call 2nd attempt: No answer so I left a detailed message (Ok per [facility]) on the pts VM asking him to call Jeani Hawking at [facility] at [phone]. I also left a reminder in the VM that he has a hospital f/u scheduled on 03/21/2020 at 2:15 with Truitt Merle, NP at [address] in [place], [place] [address].  I called his wife Pamala Hurry [facility]) and got no answer so I left a VM asking her to call Jeani Hawking at [facility] at [phone] concerning the pt's hospital d/c.

## 2020-03-08 ENCOUNTER — Telehealth: Payer: Self-pay | Admitting: Cardiology

## 2020-03-08 ENCOUNTER — Encounter: Payer: Self-pay | Admitting: Family Medicine

## 2020-03-08 ENCOUNTER — Other Ambulatory Visit: Payer: Self-pay

## 2020-03-08 ENCOUNTER — Ambulatory Visit (INDEPENDENT_AMBULATORY_CARE_PROVIDER_SITE_OTHER): Payer: Medicare Other | Admitting: Family Medicine

## 2020-03-08 VITALS — BP 160/70 | HR 56 | Temp 97.9°F | Wt 138.0 lb

## 2020-03-08 DIAGNOSIS — N186 End stage renal disease: Secondary | ICD-10-CM

## 2020-03-08 DIAGNOSIS — J441 Chronic obstructive pulmonary disease with (acute) exacerbation: Secondary | ICD-10-CM | POA: Diagnosis not present

## 2020-03-08 DIAGNOSIS — F17200 Nicotine dependence, unspecified, uncomplicated: Secondary | ICD-10-CM

## 2020-03-08 DIAGNOSIS — I5032 Chronic diastolic (congestive) heart failure: Secondary | ICD-10-CM

## 2020-03-08 DIAGNOSIS — I480 Paroxysmal atrial fibrillation: Secondary | ICD-10-CM | POA: Diagnosis not present

## 2020-03-08 DIAGNOSIS — D638 Anemia in other chronic diseases classified elsewhere: Secondary | ICD-10-CM

## 2020-03-08 DIAGNOSIS — Z992 Dependence on renal dialysis: Secondary | ICD-10-CM

## 2020-03-08 DIAGNOSIS — R079 Chest pain, unspecified: Secondary | ICD-10-CM

## 2020-03-08 DIAGNOSIS — I10 Essential (primary) hypertension: Secondary | ICD-10-CM

## 2020-03-08 MED ORDER — ALBUTEROL SULFATE HFA 108 (90 BASE) MCG/ACT IN AERS: 2.0000 | INHALATION_SPRAY | RESPIRATORY_TRACT | 0 refills | Status: AC | PRN

## 2020-03-08 NOTE — Telephone Encounter (Signed)
Per wife, patient has dialysis on MWF and cannot make his TOC appt that is scheduled for 03/21/20 with Truitt Merle. I do not see any open spots on a Tuesday or Thursday until 03/27/20 and that is a same day slot. Please advise on what would be best for this patient, I can look at EP APP's schedule or NL's schedule.

## 2020-03-08 NOTE — Telephone Encounter (Signed)
Okay, thank you. I'll reach out to Truitt Merle and her nurse.

## 2020-03-08 NOTE — Telephone Encounter (Signed)
I don't see anything either unless Cecille Rubin can see him 6/29 at 3:15 pm time slot.  She will need to be asked.  Thank you

## 2020-03-08 NOTE — Progress Notes (Signed)
   Subjective:    Patient ID: Anthony [name], male    DOB: 29-Sep-1940, 79 y.o.   MRN: [id number]  HPI Here to follow up a hospital stay from 02-29-20 to 03-02-20 for bronchitis and an exacerbation of COPD. He presented with chest pain, SOB, and coughing up green sputum. No fever. A CXR showed emphysema but no pneumonia. He was treated with IV antibiotics and he was sent home on Doxycycline, which he will finish tomorrow. He was also sent home on 3 days of Prednisone. He is using his albuterol inhaler. He denies any chest pain now but the SOB remains. He also has ESRD and he gets dialysis every Monday-Wednesday-Friday. He has diastolic CHF and the recent ECHO showed an EF of 50% with grade 2 diastolic dysfunction. He has PAF but it was decided not to anticoagulate him due to risk factors. He also has anemia of chronic disease and his DC Hgb was stable at 7.7. He has a hx of drug and alcohol abuse, but he is happy to say he gave these up long ago. However he continues to smoke 1 ppd of cigarettes. hsi BP was high in the hospital and they started him on Carvedilol 6.25 mg BID. He has tolerated this well.    Review of Systems  Constitutional: Negative.   Respiratory: Positive for shortness of breath and wheezing. Negative for cough.   Cardiovascular: Positive for chest pain. Negative for palpitations and leg swelling.  Gastrointestinal: Negative.   Genitourinary: Negative.   Neurological: Negative.        Objective:   Physical Exam Constitutional:      Appearance: He is not ill-appearing.  Cardiovascular:     Rate and Rhythm: Normal rate. Rhythm irregular.     Pulses: Normal pulses.     Heart sounds: Normal heart sounds.  Pulmonary:     Effort: No respiratory distress.     Breath sounds: Wheezing present. No rhonchi or rales.  Neurological:     Mental Status: He is alert.           Assessment & Plan:  He seems to have recovered from a bronchitis and a COPD exacerbation. We refilled his  albuterol. He would benefit from a referral to Pulmonary, but I will let her PCP, Dr. Jerilee Hoh, take care of that. He knows he needs to quit smoking and I advised him to try some nicotine patches. His HTN seems to be stabilized (he has not taken his medications yet this morning). He is scheduled for a myocardial perfusion study on 03-13-20, and I think it very likely that he has some CAD. He is also scheduled to see Cardiology on 9-37-16, but this conflicts with his dialysis sessions. His wife will call Cardiology to have this rescheduled. He will see Dr. Alden Hipp on 03-15-20 for a complete physical.  Alysia Penna, MD

## 2020-03-12 NOTE — Telephone Encounter (Signed)
S/w pt moved appt to Tuesday, June 22 @ 3:15 pm.

## 2020-03-12 NOTE — Telephone Encounter (Signed)
-----   Message from [facility] sent at 03/08/2020 12:42 PM EDT ----- Regarding: Appointment Good afternoon,  This patient is scheduled for 03/21/20 with Truitt Merle for a TOC appointment. Per wife, he has dialysis on MWF and was needing to reschedule. The next Tuesday/Thursday appointment is not until 03/27/20. I reached out to triage to see if they could find anything open sooner. Kelli Churn reached back out and stated that there was a slot blocked for 3:15pm on 03/20/20 on Cecille Rubin Gerhardt's schedule but to reach out to see if that spot was allowed to be taken for this patient. There is a phone note in the patient's chart dated for 03/08/20.

## 2020-03-13 ENCOUNTER — Ambulatory Visit (HOSPITAL_COMMUNITY): Payer: Medicare Other | Attending: Cardiology

## 2020-03-13 ENCOUNTER — Other Ambulatory Visit: Payer: Self-pay

## 2020-03-13 DIAGNOSIS — R079 Chest pain, unspecified: Secondary | ICD-10-CM | POA: Diagnosis not present

## 2020-03-13 LAB — MYOCARDIAL PERFUSION IMAGING
LV dias vol: 188 mL (ref 62–150)
LV sys vol: 116 mL
Peak HR: 107 {beats}/min
Rest HR: 86 {beats}/min
SDS: 2
SRS: 0
SSS: 2
TID: 0.89

## 2020-03-13 MED ORDER — REGADENOSON 0.4 MG/5ML IV SOLN
0.4000 mg | Freq: Once | INTRAVENOUS | Status: AC
  Administered 2020-03-13: 0.4 mg via INTRAVENOUS

## 2020-03-13 MED ORDER — TECHNETIUM TC 99M TETROFOSMIN IV KIT
32.3000 | PACK | Freq: Once | INTRAVENOUS | Status: AC | PRN
  Administered 2020-03-13: 32.3 via INTRAVENOUS
  Filled 2020-03-13: qty 33

## 2020-03-13 MED ORDER — TECHNETIUM TC 99M TETROFOSMIN IV KIT
10.2000 | PACK | Freq: Once | INTRAVENOUS | Status: AC | PRN
  Administered 2020-03-13: 10.2 via INTRAVENOUS
  Filled 2020-03-13: qty 11

## 2020-03-13 NOTE — Progress Notes (Signed)
CARDIOLOGY OFFICE NOTE  Date:  03/20/2020    Anthony [name] Date of Birth: 1941-04-27 Medical Record #[id number]  PCP:  Isaac Bliss, Rayford Halsted, MD  Cardiologist:  Radford Pax (NEW)  Chief Complaint  Patient presents with  . Hospitalization Follow-up    Seen for Dr. Radford Pax    History of Present Illness: Anthony [name] is a 79 y.o. male who presents today for a post hospital visit. This was to be a TOC visit however he is outside the timeframe. Seen for Dr. Radford Pax (NEW).   He has a history of ESRD on HD, PAF, HTN, DOE and chronic diastolic CHF.  He had a nuclear stress test in 2019 that was negative for ischemia.  He has a hx of polysubstance abuse with ETOH and heroin and currently smokes.  Felt not to be a candidate for anticoagulation with his PAF due to polysubstance abuse.    Just moved to the [place] area a few months ago.   Developed chest pain during HD earlier this month - ended up in the ER. Troponin with flat trend. BP poorly controlled. Myoview was to be done but not able to obtain due to respiratory issues. Was to have outpatient testing. Then noted to be more anemic which needed to be addressed - HGB down to 7. Discharged on Doxy, inhalers and Prednisone taper.   The patient does not have symptoms concerning for COVID-19 infection (fever, chills, cough, or new shortness of breath).   Comes in today. Here alone. Home about 2 weeks. No real problems noted. He is still short of breath. He just got his inhalers today - cost him $70 to pick up - he had to wait a week to get these until he had the money. He always has some shortness of breath. He says this is now at his baseline. Still smoking. He has patches he might use. He says he is not drinking alcohol. No more chest pain. Record notes he had a colonoscopy in 2020 with 2 polyps and dvierticulosis and non bleeding internal and external hemorrhoids.   Past Medical History:  Diagnosis Date  . Alcohol abuse   .  Arthritis   . CHF (congestive heart failure) ([place])   . Chicken pox   . Chronic kidney disease   . ESRD (end stage renal disease) on dialysis ([place])   . GI bleed   . Hypertension   . Insomnia   . Stroke [facility])     History reviewed. No pertinent surgical history.   Medications: Current Meds  Medication Sig  . acetaminophen (TYLENOL) 325 MG tablet Take 325 mg by mouth daily with breakfast.  . albuterol (PROVENTIL HFA) 108 (90 Base) MCG/ACT inhaler Inhale 2 puffs into the lungs every 4 (four) hours as needed for wheezing or shortness of breath.  . budesonide-formoterol (SYMBICORT) 160-4.5 MCG/ACT inhaler Inhale 2 puffs into the lungs 2 (two) times daily.  . calcium acetate (PHOSLO) 667 MG capsule Take 667 mg by mouth 3 (three) times daily with meals.  . carvedilol (COREG) 6.25 MG tablet Take 1 tablet (6.25 mg total) by mouth 2 (two) times daily with a meal.  . hydrALAZINE (APRESOLINE) 100 MG tablet Take 100 mg by mouth 3 (three) times daily.  . methocarbamol (ROBAXIN) 500 MG tablet Take 1 tablet (500 mg total) by mouth every 8 (eight) hours as needed for muscle spasms.  . nicotine (NICODERM CQ - DOSED IN MG/24 HOURS) 14 mg/24hr patch Place 1 patch (14 mg total) onto  the skin daily.  . NON FORMULARY Take 1 capsule by mouth See admin instructions. Omega XL 60 Capsules - Green Lipped Mussel [place], Omega 3 Natural Joint Pain Relief & Inflammation Supplement: Take 1 capsule by mouth once a day  . pantoprazole (PROTONIX) 40 MG tablet Take 40 mg by mouth daily before breakfast.  . Tiotropium Bromide Monohydrate (SPIRIVA RESPIMAT) 2.5 MCG/ACT AERS Inhale 2 puffs into the lungs daily.  . traZODone (DESYREL) 50 MG tablet TAKE 1 TO 2 TABLETS AT BEDTIME AS NEEDED     Allergies: No Known Allergies  Social History: The patient  reports that he has been smoking. He has never used smokeless tobacco. He reports previous alcohol use. He reports previous drug use.   Family History: The patient's  family history includes Hypertension in his father and mother.   Review of Systems: Please see the history of present illness.   All other systems are reviewed and negative.   Physical Exam: VS:  BP 120/60   Pulse 84   Ht 5' 6.5" (1.689 m)   Wt 137 lb 6.4 oz (62.3 kg)   SpO2 98%   BMI 21.84 kg/m  .  BMI Body mass index is 21.84 kg/m.  Wt Readings from Last 3 Encounters:  03/20/20 137 lb 6.4 oz (62.3 kg)  03/15/20 135 lb 12.8 oz (61.6 kg)  03/13/20 141 lb (64 kg)    General: Alert and in no acute distress. He looks chronically ill.  Cardiac: Regular rate and rhythm.  No edema.  Respiratory:  Decreased breath sounds - has increased work of breathing.  GI: Soft and nontender.  MS: No deformity or atrophy. Gait and ROM intact.  Skin: Warm and dry. Color is normal.  Neuro:  Strength and sensation are intact and no gross focal deficits noted.  Psych: Alert, appropriate and with normal affect.   LABORATORY DATA:  EKG:  EKG is not ordered today.    Lab Results  Component Value Date   WBC 4.2 03/02/2020   HGB 7.4 (L) 03/02/2020   HCT 24.2 (L) 03/02/2020   PLT 237 03/02/2020   GLUCOSE 135 (H) 03/02/2020   ALT 20 02/29/2020   AST 25 02/29/2020   NA 137 03/02/2020   K 4.8 03/02/2020   CL 95 (L) 03/02/2020   CREATININE 12.04 (H) 03/02/2020   BUN 65 (H) 03/02/2020   CO2 25 03/02/2020   TSH 0.379 03/01/2020     BNP (last 3 results) Recent Labs    02/29/20 1408  BNP >4,500.0*    ProBNP (last 3 results) No results for input(s): PROBNP in the last 8760 hours.   Other Studies Reviewed Today:  Myoview Study Highlights 02/2020    There was no ST segment deviation noted during stress.  Nuclear stress EF: 39%.  The left ventricular ejection fraction is moderately decreased (30-44%).  Defect 1: There is a small defect of moderate severity present in the apical inferior location.  Findings consistent with prior myocardial infarction.  This is an intermediate  risk study.   1. Fixed perfusion defect at apical inferior wall suggestive of prior infarct vs artifact 2. Intermediate risk study due to moderate systolic dysfunction (EF 81%).  No ischemia.   ECHO IMPRESSIONS 02/2020  1. Left ventricular ejection fraction, by estimation, is 50 to 55%. The  left ventricle has low normal function. The left ventricle has no regional  wall motion abnormalities. The left ventricular internal cavity size was  mildly dilated. There is mild  concentric left ventricular hypertrophy. Left ventricular diastolic  parameters are consistent with Grade II diastolic dysfunction  (pseudonormalization).  2. Right ventricular systolic function is normal. The right ventricular  size is normal. There is moderately elevated pulmonary artery systolic  pressure.  3. Left atrial size was severely dilated.  4. Right atrial size was moderately dilated.  5. The mitral valve is normal in structure. Trivial mitral valve  regurgitation. No evidence of mitral stenosis.  6. The aortic valve is normal in structure. Aortic valve regurgitation is  not visualized. No aortic stenosis is present.  7. The inferior vena cava is dilated in size with <50% respiratory  variability, suggesting right atrial pressure of 15 mmHg.       Assessment and Plan:   1. Chest pain - fixed defect on Myoview - no ischemia - EF was decreased on Myoview but was normal by echo. I would favor conservative management. He denies chest pain at this time. It seemed more pleuritic.   2. HTN - has been poorly controlled - recheck by me is 150/60 - better than where it has been - I have left him on his current regimen. I suspect there is some degree of medication non compliance.   3. PAF - in sinus by exam - not a candidate for anticoagulation due to polysubstance abuse.   4 Chronic diastolic CHF - EF is normal. Volume management by HD.   5. ESRD - on HD  6. Profound anemia - this is probably  driving some of his chest pain/shortness of breath.   7. Chronic dyspnea - suspected COPD - has just started on inhalers.   Current medicines are reviewed with the patient today.  The patient does not have concerns regarding medicines other than what has been noted above.  The following changes have been made:  See above.  Labs/ tests ordered today include:    Orders Placed This Encounter  Procedures  . CBC     Disposition:   FU with Dr. Radford Pax and her team in about 4 months. Would favor supportive/conservative care. Overall prognosis looks tenuous to me.    Patient is agreeable to this plan and will call if any problems develop in the interim.   SignedTruitt Merle, NP  03/20/2020 3:37 PM  [place] [address] [place], [place]  [address] Phone: [phone] Fax: [phone]

## 2020-03-14 ENCOUNTER — Other Ambulatory Visit: Payer: Self-pay

## 2020-03-15 ENCOUNTER — Encounter: Payer: Self-pay | Admitting: Internal Medicine

## 2020-03-15 ENCOUNTER — Ambulatory Visit (INDEPENDENT_AMBULATORY_CARE_PROVIDER_SITE_OTHER): Payer: Medicare Other | Admitting: Internal Medicine

## 2020-03-15 VITALS — BP 180/70 | HR 69 | Temp 97.0°F | Ht 66.5 in | Wt 135.8 lb

## 2020-03-15 DIAGNOSIS — N186 End stage renal disease: Secondary | ICD-10-CM | POA: Diagnosis not present

## 2020-03-15 DIAGNOSIS — R0600 Dyspnea, unspecified: Secondary | ICD-10-CM

## 2020-03-15 DIAGNOSIS — Z992 Dependence on renal dialysis: Secondary | ICD-10-CM

## 2020-03-15 DIAGNOSIS — F17210 Nicotine dependence, cigarettes, uncomplicated: Secondary | ICD-10-CM

## 2020-03-15 DIAGNOSIS — I10 Essential (primary) hypertension: Secondary | ICD-10-CM | POA: Diagnosis not present

## 2020-03-15 DIAGNOSIS — F17200 Nicotine dependence, unspecified, uncomplicated: Secondary | ICD-10-CM

## 2020-03-15 DIAGNOSIS — J441 Chronic obstructive pulmonary disease with (acute) exacerbation: Secondary | ICD-10-CM

## 2020-03-15 MED ORDER — SPIRIVA RESPIMAT 2.5 MCG/ACT IN AERS: 2.0000 | INHALATION_SPRAY | Freq: Every day | RESPIRATORY_TRACT | 3 refills | Status: AC

## 2020-03-15 MED ORDER — BUDESONIDE-FORMOTEROL FUMARATE 160-4.5 MCG/ACT IN AERO: 2.0000 | INHALATION_SPRAY | Freq: Two times a day (BID) | RESPIRATORY_TRACT | 3 refills | Status: DC

## 2020-03-15 MED ORDER — NICOTINE 14 MG/24HR TD PT24: 14.0000 mg | MEDICATED_PATCH | Freq: Every day | TRANSDERMAL | 1 refills | Status: DC

## 2020-03-15 NOTE — Patient Instructions (Addendum)
-  Nice seeing you today!!  -Start Symbicort 2 puffs twice daily.  -Start Spiriva 2 puffs daily.  -Use albuterol 2 puffs every 2 hours only as needed for shortness of breath.  -Will send referral to see pulmonary (lung doctor).  -Follow up with your cardiologist as scheduled.  -Please reschedule your physical for once your shortness of breath has improved, after you have seen the lung doctor.

## 2020-03-15 NOTE — Progress Notes (Signed)
Established Patient Office Visit     This visit occurred during the SARS-CoV-2 public health emergency.  Safety protocols were in place, including screening questions prior to the visit, additional usage of staff PPE, and extensive cleaning of exam room while observing appropriate contact time as indicated for disinfecting solutions.    CC/Reason for Visit: Annual physical, shortness of breath  HPI: Anthony [name] is a 79 y.o. male who is coming in today for the above mentioned reasons.  This visit was originally scheduled as his annual wellness visit, however this has been deferred due to some acute issues.  She was hospitalized from June 9 through June 11 due to chest pains and shortness of breath.  During that hospitalization he was found to have an acute COPD exacerbation, was not discharged on oxygen and was only given an albuterol inhaler.  He was also seen by cardiology due to elevated troponins and was scheduled for an outpatient stress test.  He states this was done last Tuesday (2 days ago) although I do not see any record of this in the chart.  He is a current smoker of about half a pack a day he is interested in quitting and wants assistance.  He appears to be significantly short of breath in office today.  Fortunately sats have maintained around 97% even with ambulation.  He also has end-stage renal disease and dialyzes on Monday, Wednesday, Friday.  He had his regular dialysis session yesterday.   Past Medical/Surgical History: Past Medical History:  Diagnosis Date  . Alcohol abuse   . Arthritis   . CHF (congestive heart failure) ([place])   . Chicken pox   . Chronic kidney disease   . ESRD (end stage renal disease) on dialysis ([place])   . GI bleed   . Hypertension   . Insomnia   . Stroke [facility])     No past surgical history on file.  Social History:  reports that he has been smoking. He has never used smokeless tobacco. He reports previous alcohol use. He reports  previous drug use.  Allergies: No Known Allergies  Family History:  Family History  Problem Relation Age of Onset  . Hypertension Mother   . Hypertension Father      Current Outpatient Medications:  .  acetaminophen (TYLENOL) 325 MG tablet, Take 325 mg by mouth daily with breakfast., Disp: , Rfl:  .  albuterol (PROVENTIL HFA) 108 (90 Base) MCG/ACT inhaler, Inhale 2 puffs into the lungs every 4 (four) hours as needed for wheezing or shortness of breath., Disp: 18 g, Rfl: 0 .  calcium acetate (PHOSLO) 667 MG capsule, Take 667 mg by mouth 3 (three) times daily with meals., Disp: , Rfl:  .  carvedilol (COREG) 6.25 MG tablet, Take 1 tablet (6.25 mg total) by mouth 2 (two) times daily with a meal., Disp: 60 tablet, Rfl: 0 .  hydrALAZINE (APRESOLINE) 100 MG tablet, Take 100 mg by mouth 3 (three) times daily., Disp: , Rfl:  .  methocarbamol (ROBAXIN) 500 MG tablet, Take 1 tablet (500 mg total) by mouth every 8 (eight) hours as needed for muscle spasms., Disp: 20 tablet, Rfl: 0 .  NON FORMULARY, Take 1 capsule by mouth See admin instructions. Omega XL 60 Capsules - Green Lipped Mussel [place], Omega 3 Natural Joint Pain Relief & Inflammation Supplement: Take 1 capsule by mouth once a day, Disp: , Rfl:  .  pantoprazole (PROTONIX) 40 MG tablet, Take 40 mg by mouth daily  before breakfast., Disp: , Rfl:  .  predniSONE (DELTASONE) 10 MG tablet, Take 5 tabs daily for 3 days starting 03/03/20 then stop, Disp: 15 tablet, Rfl: 0 .  traZODone (DESYREL) 50 MG tablet, TAKE 1 TO 2 TABLETS AT BEDTIME AS NEEDED (Patient taking differently: Take 50 mg by mouth at bedtime. ), Disp: 45 tablet, Rfl: 1 .  budesonide-formoterol (SYMBICORT) 160-4.5 MCG/ACT inhaler, Inhale 2 puffs into the lungs 2 (two) times daily., Disp: 1 Inhaler, Rfl: 3 .  nicotine (NICODERM CQ - DOSED IN MG/24 HOURS) 14 mg/24hr patch, Place 1 patch (14 mg total) onto the skin daily., Disp: 90 patch, Rfl: 1 .  Tiotropium Bromide Monohydrate  (SPIRIVA RESPIMAT) 2.5 MCG/ACT AERS, Inhale 2 puffs into the lungs daily., Disp: 4 g, Rfl: 3  Review of Systems:  Constitutional: Denies fever, chills, diaphoresis, appetite change and fatigue.  HEENT: Denies photophobia, eye pain, redness, hearing loss, ear pain, congestion, sore throat, rhinorrhea, sneezing, mouth sores, trouble swallowing, neck pain, neck stiffness and tinnitus.   Respiratory: Denies  cough, chest tightness. Cardiovascular: Denies chest pain, palpitations and leg swelling.  Gastrointestinal: Denies nausea, vomiting, abdominal pain, diarrhea, constipation, blood in stool and abdominal distention.  Genitourinary: Denies dysuria, urgency, frequency, hematuria, flank pain and difficulty urinating.  Endocrine: Denies: hot or cold intolerance, sweats, changes in hair or nails, polyuria, polydipsia. Musculoskeletal: Denies myalgias, back pain, joint swelling, arthralgias and gait problem.  Skin: Denies pallor, rash and wound.  Neurological: Denies dizziness, seizures, syncope, weakness, light-headedness, numbness and headaches.  Hematological: Denies adenopathy. Easy bruising, personal or family bleeding history  Psychiatric/Behavioral: Denies suicidal ideation, mood changes, confusion, nervousness, sleep disturbance and agitation    Physical Exam: Vitals:   03/15/20 1118  BP: (!) 180/70  Pulse: 69  Temp: (!) 97 F (36.1 C)  TempSrc: Temporal  SpO2: 97%  Weight: 135 lb 12.8 oz (61.6 kg)  Height: 5' 6.5" (1.689 m)    Body mass index is 21.59 kg/m.   Constitutional: NAD, calm, comfortable, significant shortness of breath Eyes: PERRL, ENMT: Mucous membranes are moist. Respiratory: Poor air movement, mild expiratory wheezes. Cardiovascular: Irregular rhythm, regular rate no murmurs. No extremity edema.  He appears to have an S4 gallop. Neurologic: Grossly intact and nonfocal Psychiatric: Normal judgment and insight. Alert and oriented x 3. Normal mood.     Impression and Plan:  COPD exacerbation ([place])  Dyspnea, unspecified type -This has not been formally diagnosed with PFTs, but it appears the likely diagnosis given his longstanding smoking history and current findings on lung auscultation. -Start Symbicort, Spiriva, use albuterol as needed for shortness of breath. -Referral to pulmonary has been requested. -He had a recent stress test and follow-up with cardiology, he probably likely has some coronary artery disease and unclear how this is playing into the shortness of breath. -He also has a history of atrial fibrillation and is not anticoagulated, rate appears controlled today. -His history of end-stage renal disease and volume status may also be affecting his shortness of breath.  Essential hypertension -Not well controlled today at 180/70, suspect his shortness of breath might be causing elevated blood pressure readings, no changes to medications today.  He also follows with cardiology and nephrology.  ESRD (end stage renal disease) on dialysis [facility]) -On hemodialysis MWF.  Tobacco use disorder -I have discussed tobacco cessation with the patient.  I have counseled the patient regarding the negative impacts of continued tobacco use including but not limited to lung cancer, COPD, and cardiovascular disease.  I have discussed alternatives to tobacco and modalities that may help facilitate tobacco cessation including but not limited to biofeedback, hypnosis, and medications.  Total time spent with tobacco counseling was 3 minutes. -He is interested in quitting smoking, he would like to try nicotine patches first, I will prescribe.     Patient Instructions  -Nice seeing you today!!  -Start Symbicort 2 puffs twice daily.  -Start Spiriva 2 puffs daily.  -Use albuterol 2 puffs every 2 hours only as needed for shortness of breath.  -Will send referral to see pulmonary (lung doctor).  -Follow up with your cardiologist as  scheduled.  -Please reschedule your physical for once your shortness of breath has improved, after you have seen the lung doctor.     Lelon Frohlich, MD [facility]

## 2020-03-20 ENCOUNTER — Encounter: Payer: Self-pay | Admitting: Nurse Practitioner

## 2020-03-20 ENCOUNTER — Other Ambulatory Visit: Payer: Self-pay

## 2020-03-20 ENCOUNTER — Ambulatory Visit (INDEPENDENT_AMBULATORY_CARE_PROVIDER_SITE_OTHER): Payer: Medicare Other | Admitting: Nurse Practitioner

## 2020-03-20 VITALS — BP 120/60 | HR 84 | Ht 66.5 in | Wt 137.4 lb

## 2020-03-20 DIAGNOSIS — I10 Essential (primary) hypertension: Secondary | ICD-10-CM

## 2020-03-20 DIAGNOSIS — N186 End stage renal disease: Secondary | ICD-10-CM

## 2020-03-20 DIAGNOSIS — I480 Paroxysmal atrial fibrillation: Secondary | ICD-10-CM | POA: Diagnosis not present

## 2020-03-20 DIAGNOSIS — R079 Chest pain, unspecified: Secondary | ICD-10-CM | POA: Diagnosis not present

## 2020-03-20 DIAGNOSIS — I5032 Chronic diastolic (congestive) heart failure: Secondary | ICD-10-CM

## 2020-03-20 NOTE — Patient Instructions (Addendum)
After Visit Summary:  We will be checking the following labs today - CBC   Medication Instructions:    Continue with your current medicines.    If you need a refill on your cardiac medications before your next appointment, please call your pharmacy.     Testing/Procedures To Be Arranged:  N/A  Follow-Up:   See Dr. Radford Pax in a few months.     At [facility], you and your health needs are our priority.  As part of our continuing mission to provide you with exceptional heart care, we have created designated Provider Care Teams.  These Care Teams include your primary Cardiologist (physician) and Advanced Practice Providers (APPs -  Physician Assistants and Nurse Practitioners) who all work together to provide you with the care you need, when you need it.  Special Instructions:  . Stay safe, wash your hands for at least 20 seconds and wear a mask when needed.  . It was good to talk with you today.    Call the [place] office at [phone] if you have any questions, problems or concerns.

## 2020-03-21 ENCOUNTER — Ambulatory Visit: Payer: Medicare Other | Admitting: Nurse Practitioner

## 2020-03-21 LAB — CBC
Hematocrit: 24.5 % — ABNORMAL LOW (ref 37.5–51.0)
Hemoglobin: 7.7 g/dL — ABNORMAL LOW (ref 13.0–17.7)
MCH: 28 pg (ref 26.6–33.0)
MCHC: 31.4 g/dL — ABNORMAL LOW (ref 31.5–35.7)
MCV: 89 fL (ref 79–97)
Platelets: 195 10*3/uL (ref 150–450)
RBC: 2.75 x10E6/uL — ABNORMAL LOW (ref 4.14–5.80)
RDW: 15.9 % — ABNORMAL HIGH (ref 11.6–15.4)
WBC: 4.4 10*3/uL (ref 3.4–10.8)

## 2020-03-24 ENCOUNTER — Other Ambulatory Visit: Payer: Self-pay | Admitting: Internal Medicine

## 2020-03-24 DIAGNOSIS — G4700 Insomnia, unspecified: Secondary | ICD-10-CM

## 2020-06-12 ENCOUNTER — Ambulatory Visit: Payer: Medicare Other | Admitting: Cardiology

## 2020-07-30 ENCOUNTER — Other Ambulatory Visit: Payer: Self-pay | Admitting: Internal Medicine

## 2020-10-17 ENCOUNTER — Other Ambulatory Visit: Payer: Self-pay | Admitting: Internal Medicine

## 2020-10-17 DIAGNOSIS — J441 Chronic obstructive pulmonary disease with (acute) exacerbation: Secondary | ICD-10-CM

## 2021-01-17 ENCOUNTER — Other Ambulatory Visit: Payer: Self-pay | Admitting: Internal Medicine

## 2021-01-17 DIAGNOSIS — J441 Chronic obstructive pulmonary disease with (acute) exacerbation: Secondary | ICD-10-CM

## 2021-02-20 DIAGNOSIS — U071 COVID-19: Secondary | ICD-10-CM

## 2021-02-20 HISTORY — DX: COVID-19: U07.1

## 2021-03-22 DIAGNOSIS — Z9289 Personal history of other medical treatment: Secondary | ICD-10-CM

## 2021-03-22 HISTORY — DX: Personal history of other medical treatment: Z92.89

## 2021-04-16 ENCOUNTER — Emergency Department (HOSPITAL_COMMUNITY): Payer: Medicare Other

## 2021-04-16 ENCOUNTER — Other Ambulatory Visit: Payer: Self-pay

## 2021-04-16 ENCOUNTER — Inpatient Hospital Stay (HOSPITAL_COMMUNITY)
Admission: EM | Admit: 2021-04-16 | Discharge: 2021-04-24 | DRG: 811 | Disposition: A | Discharge status: Home Health | Payer: Medicare Other | Attending: Family Medicine | Admitting: Family Medicine

## 2021-04-16 DIAGNOSIS — J441 Chronic obstructive pulmonary disease with (acute) exacerbation: Secondary | ICD-10-CM | POA: Diagnosis present

## 2021-04-16 DIAGNOSIS — Z992 Dependence on renal dialysis: Secondary | ICD-10-CM

## 2021-04-16 DIAGNOSIS — I132 Hypertensive heart and chronic kidney disease with heart failure and with stage 5 chronic kidney disease, or end stage renal disease: Secondary | ICD-10-CM | POA: Diagnosis present

## 2021-04-16 DIAGNOSIS — G4700 Insomnia, unspecified: Secondary | ICD-10-CM | POA: Diagnosis present

## 2021-04-16 DIAGNOSIS — I5032 Chronic diastolic (congestive) heart failure: Secondary | ICD-10-CM

## 2021-04-16 DIAGNOSIS — F0150 Vascular dementia without behavioral disturbance: Secondary | ICD-10-CM | POA: Diagnosis present

## 2021-04-16 DIAGNOSIS — Z8616 Personal history of COVID-19: Secondary | ICD-10-CM | POA: Diagnosis not present

## 2021-04-16 DIAGNOSIS — E440 Moderate protein-calorie malnutrition: Secondary | ICD-10-CM | POA: Diagnosis present

## 2021-04-16 DIAGNOSIS — I10 Essential (primary) hypertension: Secondary | ICD-10-CM

## 2021-04-16 DIAGNOSIS — D509 Iron deficiency anemia, unspecified: Secondary | ICD-10-CM | POA: Diagnosis present

## 2021-04-16 DIAGNOSIS — I5042 Chronic combined systolic (congestive) and diastolic (congestive) heart failure: Secondary | ICD-10-CM | POA: Diagnosis present

## 2021-04-16 DIAGNOSIS — Z20822 Contact with and (suspected) exposure to covid-19: Secondary | ICD-10-CM | POA: Diagnosis present

## 2021-04-16 DIAGNOSIS — R195 Other fecal abnormalities: Secondary | ICD-10-CM | POA: Diagnosis present

## 2021-04-16 DIAGNOSIS — Z227 Latent tuberculosis: Secondary | ICD-10-CM

## 2021-04-16 DIAGNOSIS — N250 Renal osteodystrophy: Secondary | ICD-10-CM | POA: Diagnosis present

## 2021-04-16 DIAGNOSIS — E785 Hyperlipidemia, unspecified: Secondary | ICD-10-CM | POA: Diagnosis present

## 2021-04-16 DIAGNOSIS — R0609 Other forms of dyspnea: Secondary | ICD-10-CM | POA: Diagnosis not present

## 2021-04-16 DIAGNOSIS — Z7951 Long term (current) use of inhaled steroids: Secondary | ICD-10-CM

## 2021-04-16 DIAGNOSIS — R9431 Abnormal electrocardiogram [ECG] [EKG]: Secondary | ICD-10-CM | POA: Diagnosis present

## 2021-04-16 DIAGNOSIS — F17200 Nicotine dependence, unspecified, uncomplicated: Secondary | ICD-10-CM | POA: Diagnosis present

## 2021-04-16 DIAGNOSIS — K922 Gastrointestinal hemorrhage, unspecified: Secondary | ICD-10-CM | POA: Diagnosis not present

## 2021-04-16 DIAGNOSIS — F0390 Unspecified dementia without behavioral disturbance: Secondary | ICD-10-CM | POA: Diagnosis present

## 2021-04-16 DIAGNOSIS — I2510 Atherosclerotic heart disease of native coronary artery without angina pectoris: Secondary | ICD-10-CM | POA: Diagnosis present

## 2021-04-16 DIAGNOSIS — Z6821 Body mass index (BMI) 21.0-21.9, adult: Secondary | ICD-10-CM | POA: Diagnosis not present

## 2021-04-16 DIAGNOSIS — D62 Acute posthemorrhagic anemia: Secondary | ICD-10-CM | POA: Diagnosis present

## 2021-04-16 DIAGNOSIS — Z79899 Other long term (current) drug therapy: Secondary | ICD-10-CM

## 2021-04-16 DIAGNOSIS — Z8673 Personal history of transient ischemic attack (TIA), and cerebral infarction without residual deficits: Secondary | ICD-10-CM

## 2021-04-16 DIAGNOSIS — K5521 Angiodysplasia of colon with hemorrhage: Secondary | ICD-10-CM | POA: Diagnosis present

## 2021-04-16 DIAGNOSIS — D500 Iron deficiency anemia secondary to blood loss (chronic): Secondary | ICD-10-CM | POA: Diagnosis not present

## 2021-04-16 DIAGNOSIS — I480 Paroxysmal atrial fibrillation: Secondary | ICD-10-CM | POA: Diagnosis present

## 2021-04-16 DIAGNOSIS — Z8249 Family history of ischemic heart disease and other diseases of the circulatory system: Secondary | ICD-10-CM

## 2021-04-16 DIAGNOSIS — K31819 Angiodysplasia of stomach and duodenum without bleeding: Secondary | ICD-10-CM | POA: Diagnosis not present

## 2021-04-16 DIAGNOSIS — D649 Anemia, unspecified: Secondary | ICD-10-CM

## 2021-04-16 DIAGNOSIS — K449 Diaphragmatic hernia without obstruction or gangrene: Secondary | ICD-10-CM | POA: Diagnosis present

## 2021-04-16 DIAGNOSIS — N186 End stage renal disease: Secondary | ICD-10-CM | POA: Diagnosis present

## 2021-04-16 DIAGNOSIS — N185 Chronic kidney disease, stage 5: Secondary | ICD-10-CM | POA: Diagnosis not present

## 2021-04-16 DIAGNOSIS — J449 Chronic obstructive pulmonary disease, unspecified: Secondary | ICD-10-CM | POA: Diagnosis present

## 2021-04-16 LAB — BASIC METABOLIC PANEL
Anion gap: 12 (ref 5–15)
BUN: 50 mg/dL — ABNORMAL HIGH (ref 8–23)
CO2: 28 mmol/L (ref 22–32)
Calcium: 9.2 mg/dL (ref 8.9–10.3)
Chloride: 103 mmol/L (ref 98–111)
Creatinine, Ser: 12.2 mg/dL — ABNORMAL HIGH (ref 0.61–1.24)
GFR, Estimated: 4 mL/min — ABNORMAL LOW (ref >60)
Glucose, Bld: 124 mg/dL — ABNORMAL HIGH (ref 70–99)
Potassium: 3.8 mmol/L (ref 3.5–5.1)
Sodium: 143 mmol/L (ref 135–145)

## 2021-04-16 LAB — ABO/RH: ABO/RH(D): O POS

## 2021-04-16 LAB — CBC WITH DIFFERENTIAL/PLATELET
Abs Immature Granulocytes: 0.02 10*3/uL (ref 0.00–0.07)
Basophils Absolute: 0 10*3/uL (ref 0.0–0.1)
Basophils Relative: 1 %
Eosinophils Absolute: 0.2 10*3/uL (ref 0.0–0.5)
Eosinophils Relative: 4 %
HCT: 21.6 % — ABNORMAL LOW (ref 39.0–52.0)
Hemoglobin: 6.5 g/dL — CL (ref 13.0–17.0)
Immature Granulocytes: 1 %
Lymphocytes Relative: 17 %
Lymphs Abs: 0.7 10*3/uL (ref 0.7–4.0)
MCH: 30.7 pg (ref 26.0–34.0)
MCHC: 30.1 g/dL (ref 30.0–36.0)
MCV: 101.9 fL — ABNORMAL HIGH (ref 80.0–100.0)
Monocytes Absolute: 0.3 10*3/uL (ref 0.1–1.0)
Monocytes Relative: 8 %
Neutro Abs: 2.8 10*3/uL (ref 1.7–7.7)
Neutrophils Relative %: 69 %
Platelets: 212 10*3/uL (ref 150–400)
RBC: 2.12 MIL/uL — ABNORMAL LOW (ref 4.22–5.81)
RDW: 17.8 % — ABNORMAL HIGH (ref 11.5–15.5)
WBC: 4 10*3/uL (ref 4.0–10.5)
nRBC: 0 % (ref 0.0–0.2)

## 2021-04-16 LAB — MAGNESIUM: Magnesium: 2.5 mg/dL — ABNORMAL HIGH (ref 1.7–2.4)

## 2021-04-16 LAB — POC OCCULT BLOOD, ED: Fecal Occult Bld: POSITIVE — AB

## 2021-04-16 LAB — PREPARE RBC (CROSSMATCH)

## 2021-04-16 LAB — AMMONIA: Ammonia: 59 umol/L — ABNORMAL HIGH (ref 9–35)

## 2021-04-16 MED ORDER — PREDNISONE 20 MG PO TABS
40.0000 mg | ORAL_TABLET | Freq: Every day | ORAL | Status: DC
  Administered 2021-04-18: 40 mg via ORAL
  Filled 2021-04-16: qty 2

## 2021-04-16 MED ORDER — ALBUTEROL SULFATE (2.5 MG/3ML) 0.083% IN NEBU: 2.5000 mg | INHALATION_SOLUTION | RESPIRATORY_TRACT | Status: DC | PRN

## 2021-04-16 MED ORDER — ACETAMINOPHEN 325 MG PO TABS
650.0000 mg | ORAL_TABLET | Freq: Four times a day (QID) | ORAL | Status: DC | PRN
  Administered 2021-04-18: 650 mg via ORAL
  Filled 2021-04-16: qty 2

## 2021-04-16 MED ORDER — METHYLPREDNISOLONE SODIUM SUCC 40 MG IJ SOLR
40.0000 mg | Freq: Two times a day (BID) | INTRAMUSCULAR | Status: AC
  Administered 2021-04-16 – 2021-04-17 (×2): 40 mg via INTRAVENOUS
  Filled 2021-04-16 (×2): qty 1

## 2021-04-16 MED ORDER — SODIUM CHLORIDE 0.9% FLUSH
3.0000 mL | Freq: Two times a day (BID) | INTRAVENOUS | Status: DC
  Administered 2021-04-16 – 2021-04-17 (×2): 3 mL via INTRAVENOUS

## 2021-04-16 MED ORDER — SODIUM CHLORIDE 0.9 % IV SOLN: 10.0000 mL/h | Freq: Once | INTRAVENOUS | Status: DC

## 2021-04-16 MED ORDER — IPRATROPIUM-ALBUTEROL 0.5-2.5 (3) MG/3ML IN SOLN
3.0000 mL | Freq: Four times a day (QID) | RESPIRATORY_TRACT | Status: DC
  Administered 2021-04-16 – 2021-04-21 (×11): 3 mL via RESPIRATORY_TRACT
  Filled 2021-04-16 (×14): qty 3

## 2021-04-16 MED ORDER — SODIUM CHLORIDE 0.9% IV SOLUTION: Freq: Once | INTRAVENOUS | Status: DC

## 2021-04-16 MED ORDER — HYDROCODONE-ACETAMINOPHEN 5-325 MG PO TABS
1.0000 | ORAL_TABLET | ORAL | Status: DC | PRN
Start: 2021-04-16 — End: 2021-04-24
  Filled 2021-04-16: qty 2

## 2021-04-16 MED ORDER — SODIUM CHLORIDE 0.9% FLUSH: 3.0000 mL | INTRAVENOUS | Status: DC | PRN

## 2021-04-16 MED ORDER — SODIUM CHLORIDE 0.9 % IV SOLN
100.0000 mg | Freq: Two times a day (BID) | INTRAVENOUS | Status: DC
  Administered 2021-04-16 – 2021-04-17 (×2): 100 mg via INTRAVENOUS
  Filled 2021-04-16 (×4): qty 100

## 2021-04-16 MED ORDER — SODIUM CHLORIDE 0.9 % IV SOLN: 250.0000 mL | INTRAVENOUS | Status: DC | PRN

## 2021-04-16 MED ORDER — ACETAMINOPHEN 650 MG RE SUPP: 650.0000 mg | Freq: Four times a day (QID) | RECTAL | Status: DC | PRN

## 2021-04-16 MED ORDER — PANTOPRAZOLE SODIUM 40 MG IV SOLR
40.0000 mg | Freq: Two times a day (BID) | INTRAVENOUS | Status: DC
  Administered 2021-04-16 – 2021-04-17 (×2): 40 mg via INTRAVENOUS
  Filled 2021-04-16 (×2): qty 40

## 2021-04-16 MED ORDER — SODIUM CHLORIDE 0.9% FLUSH
3.0000 mL | Freq: Two times a day (BID) | INTRAVENOUS | Status: DC
  Administered 2021-04-16 – 2021-04-17 (×3): 3 mL via INTRAVENOUS

## 2021-04-16 NOTE — ED Provider Notes (Signed)
[facility] EMERGENCY DEPARTMENT Provider Note   CSN: [id number] Arrival date & time: 04/16/21  1016     History No chief complaint on file.   Anthony [name] is a 80 y.o. male with PMHx HTN, ESRD on Dialysis T Th S, CHF, Stroke, PAF, COPD who presents to the ED today with chief complaint of "needs dialysis."  Patient reports that he last received dialysis this past Saturday.  He states that afterwards he moved from [facility] to here.  He states that he assumed that he could go to a previous dialysis center that he was a patient had several years ago however they told him that they could not see him prompting ED visit today.  He states that he has felt short of breath for the past few days.  He denies any other complaints at this time.  Per chart review he was recently seen at [facility] for dark stools, Hemoccult positive with a hemoglobin of 7.9, previous 9.5.  He was admitted to the hospital, and GI cocktail for abdominal pain and provided PPI.  GI was consulted and they recommended PPI twice daily and outpatient follow-up.  His hemoglobin improved to 8 point 5 in the morning and he was discharged home.  States he has no longer had any bloody stools.  Denies any other complaints at this time.  The history is provided by the patient and medical records.      Past Medical History:  Diagnosis Date   Alcohol abuse    Arthritis    CHF (congestive heart failure) (HCC)    Chicken pox    Chronic kidney disease    ESRD (end stage renal disease) on dialysis ([place])    GI bleed    Hypertension    Insomnia    Stroke [facility])     Patient Active Problem List   Diagnosis Date Noted   Anemia 04/16/2021   COPD exacerbation ([place]) 03/01/2020   Tobacco use disorder 03/01/2020   Chronic diastolic heart failure (HCC)    PAF (paroxysmal atrial fibrillation) ([place])    Anemia of chronic disease 02/29/2020   Hypertensive urgency 02/29/2020   Elevated troponin 02/29/2020    Chest pain 02/29/2020   ESRD (end stage renal disease) on dialysis ([place]) 12/15/2019   HTN (hypertension) 12/15/2019   Insomnia     No past surgical history on file.     Family History  Problem Relation Age of Onset   Hypertension Mother    Hypertension Father     Social History   Tobacco Use   Smoking status: Every Day   Smokeless tobacco: Never  Substance Use Topics   Alcohol use: Not Currently   Drug use: Not Currently    Home Medications Prior to Admission medications   Medication Sig Start Date End Date Taking? Authorizing Provider  acetaminophen (TYLENOL) 325 MG tablet Take 325 mg by mouth daily with breakfast.    [provider]  ADVAIR HFA 45-21 MCG/ACT inhaler INHALE 2 PUFFS INTO THE LUNGS IN THE MORNING AND AT BEDTIME 01/17/21   Isaac Bliss, Rayford Halsted, MD  albuterol (PROVENTIL HFA) 108 (90 Base) MCG/ACT inhaler Inhale 2 puffs into the lungs every 4 (four) hours as needed for wheezing or shortness of breath. 03/08/20   Laurey Morale, MD  atorvastatin (LIPITOR) 20 MG tablet TAKE 1 TABLET BY MOUTH EVERYDAY AT BEDTIME 07/30/20   Isaac Bliss, Rayford Halsted, MD  calcium acetate (PHOSLO) 667 MG capsule Take 667 mg by  mouth 3 (three) times daily with meals.    [provider]  carvedilol (COREG) 6.25 MG tablet Take 1 tablet (6.25 mg total) by mouth 2 (two) times daily with a meal. 03/02/20   Black, Lezlie Octave, NP  hydrALAZINE (APRESOLINE) 100 MG tablet Take 100 mg by mouth 3 (three) times daily. 07/31/19   [provider]  methocarbamol (ROBAXIN) 500 MG tablet Take 1 tablet (500 mg total) by mouth every 8 (eight) hours as needed for muscle spasms. 12/21/19   Lucrezia Starch, MD  nicotine (NICODERM CQ - DOSED IN MG/24 HOURS) 14 mg/24hr patch Place 1 patch (14 mg total) onto the skin daily. 03/15/20   Isaac Bliss, Rayford Halsted, MD  NON FORMULARY Take 1 capsule by mouth See admin instructions. Omega XL 60 Capsules - Green Lipped Mussel [place], Omega  3 Natural Joint Pain Relief & Inflammation Supplement: Take 1 capsule by mouth once a day    [provider]  pantoprazole (PROTONIX) 40 MG tablet Take 40 mg by mouth daily before breakfast.    [provider]  Tiotropium Bromide Monohydrate (SPIRIVA RESPIMAT) 2.5 MCG/ACT AERS Inhale 2 puffs into the lungs daily. 03/15/20   Isaac Bliss, Rayford Halsted, MD  traZODone (DESYREL) 50 MG tablet TAKE 1 TO 2 TABLETS AT BEDTIME AS NEEDED 03/28/20   Isaac Bliss, Rayford Halsted, MD    Allergies    Patient has no known allergies.  Review of Systems   Review of Systems  Constitutional:  Negative for chills and fever.  Respiratory:  Positive for shortness of breath. Negative for cough.   Cardiovascular:  Negative for chest pain.  Gastrointestinal:  Negative for blood in stool.  All other systems reviewed and are negative.  Physical Exam Updated Vital Signs BP 135/70 (BP Location: Right Arm)   Pulse 67   Temp (!) 97.5 F (36.4 C) (Oral)   Resp 16   SpO2 96%   Physical Exam Vitals and nursing note reviewed.  Constitutional:      Appearance: He is not ill-appearing or diaphoretic.  HENT:     Head: Normocephalic and atraumatic.  Eyes:     Conjunctiva/sclera: Conjunctivae normal.  Cardiovascular:     Rate and Rhythm: Normal rate and regular rhythm.     Pulses: Normal pulses.  Pulmonary:     Effort: Pulmonary effort is normal.     Breath sounds: Normal breath sounds. No wheezing, rhonchi or rales.     Comments: Dialysis port to right chest wall Speaking in full sentences without difficulty, satting 96% on room air. Abdominal:     Palpations: Abdomen is soft.     Tenderness: There is no abdominal tenderness. There is no guarding or rebound.  Genitourinary:    Comments: Dark brown stool on gloved finger. Guaiac positive.  Musculoskeletal:     Cervical back: Neck supple.  Skin:    General: Skin is warm and dry.  Neurological:     Mental Status: He is alert.    ED Results  / Procedures / Treatments   Labs (all labs ordered are listed, but only abnormal results are displayed) Labs Reviewed  CBC WITH DIFFERENTIAL/PLATELET - Abnormal; Notable for the following components:      Result Value   RBC 2.12 (*)    Hemoglobin 6.5 (*)    HCT 21.6 (*)    MCV 101.9 (*)    RDW 17.8 (*)    All other components within normal limits  BASIC METABOLIC PANEL - Abnormal;  Notable for the following components:   Glucose, Bld 124 (*)    BUN 50 (*)    Creatinine, Ser 12.20 (*)    GFR, Estimated 4 (*)    All other components within normal limits  MAGNESIUM - Abnormal; Notable for the following components:   Magnesium 2.5 (*)    All other components within normal limits  POC OCCULT BLOOD, ED - Abnormal; Notable for the following components:   Fecal Occult Bld POSITIVE (*)    All other components within normal limits  SARS CORONAVIRUS 2 (TAT 6-24 HRS)  HEPATIC FUNCTION PANEL  ETHANOL  RAPID URINE DRUG SCREEN, HOSP PERFORMED  ABO/RH  TYPE AND SCREEN  PREPARE RBC (CROSSMATCH)    EKG None  Radiology No results found.  Procedures .Critical Care  Date/Time: 04/16/2021 7:29 PM Performed by: Eustaquio Maize, PA-C Authorized by: Eustaquio Maize, PA-C   Critical care provider statement:    Critical care time (minutes):  45   Critical care was time spent personally by me on the following activities:  Discussions with consultants, evaluation of patient's response to treatment, examination of patient, ordering and performing treatments and interventions, ordering and review of laboratory studies, ordering and review of radiographic studies, pulse oximetry, re-evaluation of patient's condition, obtaining history from patient or surrogate and review of old charts   Medications Ordered in ED Medications  0.9 %  sodium chloride infusion (has no administration in time range)    ED Course  I have reviewed the triage vital signs and the nursing notes.  Pertinent labs &  imaging results that were available during my care of the patient were reviewed by me and considered in my medical decision making (see chart for details).  Clinical Course as of 04/16/21 1929  Tue Apr 16, 2021  1743 DG Chest [place] 1 View [DR]    Clinical Course User Index [DR] Pattricia Boss, MD   MDM Rules/Calculators/A&P                           80 year old male who presents to the ED today for need of dialysis.  History of end-stage renal disease on dialysis Tuesday Thursday Saturday.  Recently moved to the area from [facility] and has not set up primary care or nephrology.  Last dialyzed on Saturday.  Has been having shortness of breath.  On arrival to the ED today vitals are stable.  Patient afebrile, nontachycardic and nontachypneic.  On my exam he is resting comfortably, speaking in full sentences without difficulty.  Satting 96% on room air.  He did have lab work done while in the waiting room which did show a hemoglobin of 6.5.  Per chart review patient admitted to [facility] approximately 1 month ago for complaint of dark tarry stool, Hemoccult positive with a hemoglobin of 7.5.  He was started on a PPI with improvement in his hemoglobin to 8.5, did not require blood transfusion at that time and states he has not had any bloody stool since then.  Will plan for rectal exam at this time and plan for blood transfusion.  We will plan to place transition of care consult for PCP needs.  We will also touch base with nephrology, creatinine is 12.2 at this time and potassium is within normal limits at 3.8.  Do not feel patient needs emergent dialysis today however will need to be set up.  We will plan for admission for blood transfusion and hopefully patient  can be dialyzed tomorrow and set up for appropriate outpatient follow-up.  Have discussed case with attending physician Dr. Jeanell Sparrow who agrees with plan.  TOC consulted to see if pt can be set up with primary care.  Dr. Johnney Ou with  Nephrology consulted as well - will plan to likely dialyze tomorrow.   GU exam with dark brown stool on gloved finger. Guaiac positive. Not melanotic on exam. Transfusion ordered at this time.   Discussed with [facility] Dr. Roel Cluck for admission. Appreciate her involvement.   This note was prepared using Dragon voice recognition software and may include unintentional dictation errors due to the inherent limitations of voice recognition software.   Final Clinical Impression(s) / ED Diagnoses Final diagnoses:  Anemia, unspecified type  ESRD (end stage renal disease) [facility])    Rx / DC Orders ED Discharge Orders     None        Eustaquio Maize, PA-C 04/16/21 1929    Pattricia Boss, MD 04/16/21 2258

## 2021-04-16 NOTE — H&P (Addendum)
Anthony [name] [id number] DOB: 30-Aug-1941 DOA: 04/16/2021     PCP: Isaac Bliss, Rayford Halsted, MD   Outpatient Specialists:  NONE    Patient arrived to ER on 04/16/21 at 1016 Referred by Attending Toy Baker, MD   Patient coming from: home Lives  With family    Chief Complaint: needs HD , SOB   HPI: Anthony [name] is a 80 y.o. male with medical history significant of ESRD, anemia vascular dementia Alcohol abuse, Anemia, Chronic heart failure with preserved ejection fraction  COPD, CVA, esophagitis, GIB, HTN, p A.fib, pulmonary hypertension, tobacco abuse, hx of positive PPD    Presented with   shortness of breath and need for HD Pt [place] moved from  [place], hx of ESRD on HD Tuesday Thursday Saturday. Needed HD today last hemodialysis was on Saturday and presented to dialysis center but was informed that he needs to be first established care with Nephrologist Hx of anemia in the past was admitted with hemoglobin down to 7.5 at that time had rectal bleeding was started on PPI and the plan was for him to be watched his hemoglobin improved and he was discharged home without any need for transfusion.  Currently denies any blood per rectum or black stools Gets dialyzed through a right chest HD catheter.  Patient has been having some shortness of breath worse for the past 3 days with wheezing and sputum production , no fever no CP similar to COPD exacerbations, at baseline not on O2  No chest pain no fever chills  Recent Covid infection June  2022 managed as outpt    Denies current EToH but reports he sill smokes daily  Apparently has been on recent steroids for COPD exacerbation    Has  been vaccinated against COVID  and boosted   Initial COVID TEST  in house  PCR testing  Pending     Regarding pertinent Chronic problems:     Hyperlipidemia -  on statins Lipitor     HTN on Norvasc, coreg, hydralazine, Imdur   chronic CHF diastolic/systolic/ combined -  last echo Dobutamine stress Aprill 2022 showed EF 47.8   CAD  - On Aspirin, statin, betablocker,                  -  cardiology in [place] recent abnormal dobutamine stress test 12/28/20,                  - last cardiac cath [facility] on 03/04/21 and demonstrated ostial LAD lesion with DFR of 0.90 suggesting that it is not significant flow limiting, proximal PDA 50% lesion, 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 Cardiology recommended medical management, starting IMDUR and Ranexa for angina symptoms and will followoutpatient on Coreg 6.'25mg'$  BID, Norvasc '10mg'$  daily, Hydralazine '50mg'$  TID, IMDUR '30mg'$ daily, ASA, and Lipitor.    Hx of GIB - recent admit June 2022 for hg of 7.5 GI was curb sided and recommended PPI BID and outpatient follow-up.  - EGD 9/21: Z-line irregular, mucosal nodule in esophagus, erosive gastropathy, , diffuse gastropathy, erythemous duodenopathy - Capsule endoscopy 9/21: few AVMs in proximal small bowel, did not pass thru SB completely before end of recording - Push enteroscopy 9/17: z-line irregular, small hiatal hernia, erythematous mucosa in antrum, erythematous duodenopathy, single bleeding angiodysplastic lesion in duodenum treated w/ APC, two non-bleeding angiodysplastic lesions treated w/ APC  - Colonoscopy 06/2019: diverticulosis, 2 polyps, stool In entire colon,external and internal hemorrhoids  abdominal surgery for "hole in  stomach" in 2021.   COPD - not  followed by pulmonology   not  on baseline oxygen    On symbicort and spiriva     A. Fib -  - CHA2DS2 vas score   5     Not on anticoagulation secondary to Risk  recurrent bleeding         -  Rate control:  Currently controlled with  Coreg             ESRD On HD T, H, sat  Lab Results  Component Value Date   CREATININE 12.20 (H) 04/16/2021   CREATININE 12.04 (H) 03/02/2020   CREATININE 8.56 (H) 02/29/2020     Vascular Dementia - not on meds   Chronic anemia - baseline hg Hemoglobin & Hematocrit   Recent Labs    04/16/21 1046  HGB 6.5*    While in ER: Noted hg down to 6.5  hemoccult positive Nephrology consulted plan for HD in AM no indication for emergent HD sW has been consulted as well    ED Triage Vitals  Enc Vitals Group     BP 04/16/21 1046 126/80     Pulse Rate 04/16/21 1046 (!) 59     Resp 04/16/21 1046 18     Temp 04/16/21 1046 (!) 97.5 F (36.4 C)     Temp Source 04/16/21 1046 Oral     SpO2 04/16/21 1046 99 %     Weight --      Height --      Head Circumference --      Peak Flow --      Pain Score 04/16/21 1047 0     Pain Loc --      Pain Edu? --      Excl. in [place]? --   TMAX(24)@     _________________________________________ Significant initial  Findings: Abnormal Labs Reviewed  CBC WITH DIFFERENTIAL/PLATELET - Abnormal; Notable for the following components:      Result Value   RBC 2.12 (*)    Hemoglobin 6.5 (*)    HCT 21.6 (*)    MCV 101.9 (*)    RDW 17.8 (*)    All other components within normal limits  BASIC METABOLIC PANEL - Abnormal; Notable for the following components:   Glucose, Bld 124 (*)    BUN 50 (*)    Creatinine, Ser 12.20 (*)    GFR, Estimated 4 (*)    All other components within normal limits  MAGNESIUM - Abnormal; Notable for the following components:   Magnesium 2.5 (*)    All other components within normal limits   ____________________________________________ Ordered    CXR -  NON acute     ECG: Ordered Personally reviewed by me showing: HR : 56 Rhythm: sinus brady    T wave inversions QTC 544 ___________   The recent clinical data is shown below. Vitals:   04/16/21 1046 04/16/21 1231 04/16/21 1547  BP: 126/80 (!) 143/69 135/70  Pulse: (!) 59 60 67  Resp: '18 18 16  '$ Temp: (!) 97.5 F (36.4 C) (!) 97.4 F (36.3 C) (!) 97.5 F (36.4 C)  TempSrc: Oral Oral Oral  SpO2: 99% 100% 96%    WBC     Component Value Date/Time   WBC 4.0 04/16/2021 1046   LYMPHSABS 0.7 04/16/2021 1046   MONOABS 0.3 04/16/2021  1046   EOSABS 0.2 04/16/2021 1046   BASOSABS 0.0 04/16/2021 1046     Lactic Acid, Venous No results found  for: LATICACIDVEN      UA ordered     Results for orders placed or performed during the hospital encounter of 02/29/20  SARS Coronavirus 2 by RT PCR (hospital order, performed in [facility] hospital lab) Nasopharyngeal Nasopharyngeal Swab     Status: None   Collection Time: 02/29/20  2:26 PM   Specimen: Nasopharyngeal Swab  Result Value Ref Range Status   SARS Coronavirus 2 NEGATIVE NEGATIVE Final    Comment: (NOTE) SARS-CoV-2 target nucleic acids are NOT DETECTED. The SARS-CoV-2 RNA is generally detectable in upper and lower respiratory specimens during the acute phase of infection. The lowest concentration of SARS-CoV-2 viral copies this assay can detect is 250 copies / mL. A negative result does not preclude SARS-CoV-2 infection and should not be used as the sole basis for treatment or other patient management decisions.  A negative result may occur with improper specimen collection / handling, submission of specimen other than nasopharyngeal swab, presence of viral mutation(s) within the areas targeted by this assay, and inadequate number of viral copies (<250 copies / mL). A negative result must be combined with clinical observations, patient history, and epidemiological information. Fact Sheet for Patients:   StrictlyIdeas.no Fact Sheet for Healthcare Providers: BankingDealers.co.za This test is not yet approved or cleared  by the [place] FDA and has been authorized for detection and/or diagnosis of SARS-CoV-2 by FDA under an Emergency Use Authorization (EUA).  This EUA will remain in effect (meaning this test can be used) for the duration of the COVID-19 declaration under Section 564(b)(1) of the Act, 21 U.S.C. section 360bbb-3(b)(1), unless the authorization is terminated or revoked sooner. Performed at [facility], [place] [address] [place], [place] [address]      _______________________________________________________ ER Provider Called:    nephrology  Dr.Kruska They Recommend admit to medicine  Will see in AM    _______________________________________________ Hospitalist was called for admission for symptomatic anemia and need for HD  The following Work up has been ordered so far:  Orders Placed This Encounter  Procedures   SARS CORONAVIRUS 2 (TAT 6-24 HRS) Nasopharyngeal Nasopharyngeal Swab   DG Chest Port 1 View   CBC with Differential   Basic metabolic panel   Magnesium   Diet renal/carb modified with fluid restriction Diet-HS Snack? Nothing; Fluid restriction: 1200 mL Fluid; Room service appropriate? Yes; Fluid consistency: Thin   Page Admitting Doctor upon patients arrival to unit/floor   Vital signs   Up with assistance   Initiate [place]rier Fluid Protocol   Cardiac Monitoring   Cardiac monitoring   Consult to Transition of Care Team (SW and CM)   Consult to nephrology   POC occult blood, ED   ED EKG   EKG 12-Lead   Type and screen [place]   [place] lock IV   Admit to Inpatient (patient's expected length of stay will be greater than 2 midnights or inpatient only procedure)     Following Medications were ordered in ER: Medications - No data to display    OTHER Significant initial  Findings:  labs showing:   Recent Labs  Lab 04/16/21 1046  NA 143  K 3.8  CO2 28  GLUCOSE 124*  BUN 50*  CREATININE 12.20*  CALCIUM 9.2  MG 2.5*      No results for input(s): AST, ALT, ALKPHOS, BILITOT, PROT, ALBUMIN in the last 168 hours. Lab Results  Component Value Date   CALCIUM 9.2 04/16/2021   PHOS 7.0 (  H) 03/02/2020          Plt: Lab Results  Component Value Date   PLT 212 04/16/2021        Recent Labs  Lab 04/16/21 1046  WBC 4.0  NEUTROABS 2.8  HGB 6.5*  HCT 21.6*  MCV 101.9*  PLT 212    HG/HCT    Down    from baseline see  below    Component Value Date/Time   HGB 6.5 (LL) 04/16/2021 1046   HGB 7.7 (L) 03/20/2020 1539   HCT 21.6 (L) 04/16/2021 1046   HCT 24.5 (L) 03/20/2020 1539   MCV 101.9 (H) 04/16/2021 1046   MCV 89 03/20/2020 1539      Radiological Exams on Admission: DG Chest Port 1 View  Result Date: 04/16/2021 CLINICAL DATA:  Shortness of breath. EXAM: PORTABLE CHEST 1 VIEW COMPARISON:  February 29, 2020. FINDINGS: The heart size and mediastinal contours are within normal limits. Interval placement of right internal jugular dialysis catheter with distal tip in expected position of the SVC. No pneumothorax or pleural effusion is noted. Right lung is clear. Mild left basilar subsegmental atelectasis or scarring is noted. The visualized skeletal structures are unremarkable. IMPRESSION: Mild left basilar subsegmental atelectasis or scarring. Aortic Atherosclerosis (ICD10-I70.0). Electronically Signed   By: Marijo Conception M.D.   On: 04/16/2021 19:27   _______________________________________________________________________________________________________ Latest  Blood pressure 135/70, pulse 67, temperature (!) 97.5 F (36.4 C), temperature source Oral, resp. rate 16, SpO2 96 %.   Review of Systems:    Pertinent positives include:   fatigue shortness of breath at rest.   dyspnea on exertion,  Constitutional:  No weight loss, night sweats, Fevers, chills,, weight loss  HEENT:  No headaches, Difficulty swallowing,Tooth/dental problems,Sore throat,  No sneezing, itching, ear ache, nasal congestion, post nasal drip,  Cardio-vascular:  No chest pain, Orthopnea, PND, anasarca, dizziness, palpitations.no Bilateral lower extremity swelling  GI:  No heartburn, indigestion, abdominal pain, nausea, vomiting, diarrhea, change in bowel habits, loss of appetite, melena, blood in stool, hematemesis Resp:  no  No excess mucus, no productive cough, No non-productive cough, No coughing up of blood.No change in color of  mucus.No wheezing. Skin:  no rash or lesions. No jaundice GU:  no dysuria, change in color of urine, no urgency or frequency. No straining to urinate.  No flank pain.  Musculoskeletal:  No joint pain or no joint swelling. No decreased range of motion. No back pain.  Psych:  No change in mood or affect. No depression or anxiety. No memory loss.  Neuro: no localizing neurological complaints, no tingling, no weakness, no double vision, no gait abnormality, no slurred speech, no confusion  All systems reviewed and apart from Volin all are negative _______________________________________________________________________________________________ Past Medical History:   Past Medical History:  Diagnosis Date   Alcohol abuse    Arthritis    CHF (congestive heart failure) (HCC)    Chicken pox    Chronic kidney disease    ESRD (end stage renal disease) on dialysis ([place])    GI bleed    Hypertension    Insomnia    Stroke ([place])       No past surgical history on file.  Social History:  Ambulatory independently       reports that he has been smoking. He has never used smokeless tobacco. He reports previous alcohol use. He reports previous drug use.    Family History:   Family History  Problem Relation Age of Onset  Hypertension Mother    Hypertension Father   MEDICATION LIST Current Outpatient Medications  Medication Sig Dispense Refill   albuterol HFA (PROVENTIL HFA;VENTOLIN HFA) 90 mcg/actuation Inhalation HFA Aerosol Inhaler Take 2 Puffs by inhalation every 4 hours as needed for Wheezing. 18 g 12   amLODIPine (NORVASC) 5 mg Oral Tablet Take 1 Tablet by mouth daily. 90 Tablet 12   aspirin ([employer] ASPIRIN) 81 mg Oral Tablet, Chewable Take 1 Tablet by mouth daily. 100 Tablet 3   atorvastatin (LIPITOR) 20 mg Oral Tablet Take 1 Tablet by mouth at bedtime. 30 Tablet 12   budesonide-formoteroL (SYMBICORT) 160-4.5 mcg/actuation Inhalation HFA Aerosol Inhaler 2 Puffs twice a  day. 10.2 g 12   calcium acetate,phosphat bind, (PHOSLO) 667 mg Oral Capsule Take 1,334 mg by mouth three times a day.   carvediloL (COREG) 6.25 mg Oral Tablet Take 1 Tablet by mouth twice a day. 60 Tablet 12   hydrALAZINE (APRESOLINE) 50 mg Oral Tablet Take 1 Tablet by mouth every 8 hours. 90 Tablet 3   isosorbide mononitrate (IMDUR) 30 mg Oral Tablet Sustained Release 24HR Take 1 Tablet by mouth daily. 30 Tablet 3   Spiriva Respimat 2.5 mcg/actuation Inhalation Mist Take 2 Inhalations by inhalation daily. 4 g 3    ______________________________________________________________________________________________ Allergies: No Known Allergies   Prior to Admission medications   Medication Sig Start Date End Date Taking? Authorizing Provider  acetaminophen (TYLENOL) 325 MG tablet Take 325 mg by mouth daily with breakfast.    [provider]  ADVAIR HFA 45-21 MCG/ACT inhaler INHALE 2 PUFFS INTO THE LUNGS IN THE MORNING AND AT BEDTIME 01/17/21   Isaac Bliss, Rayford Halsted, MD  albuterol (PROVENTIL HFA) 108 (90 Base) MCG/ACT inhaler Inhale 2 puffs into the lungs every 4 (four) hours as needed for wheezing or shortness of breath. 03/08/20   Laurey Morale, MD  atorvastatin (LIPITOR) 20 MG tablet TAKE 1 TABLET BY MOUTH EVERYDAY AT BEDTIME 07/30/20   Isaac Bliss, Rayford Halsted, MD  calcium acetate (PHOSLO) 667 MG capsule Take 667 mg by mouth 3 (three) times daily with meals.    [provider]  carvedilol (COREG) 6.25 MG tablet Take 1 tablet (6.25 mg total) by mouth 2 (two) times daily with a meal. 03/02/20   Black, Lezlie Octave, NP  hydrALAZINE (APRESOLINE) 100 MG tablet Take 100 mg by mouth 3 (three) times daily. 07/31/19   [provider]  methocarbamol (ROBAXIN) 500 MG tablet Take 1 tablet (500 mg total) by mouth every 8 (eight) hours as needed for muscle spasms. 12/21/19   Lucrezia Starch, MD  nicotine (NICODERM CQ - DOSED IN MG/24 HOURS) 14 mg/24hr patch Place 1 patch (14 mg total)  onto the skin daily. 03/15/20   Isaac Bliss, Rayford Halsted, MD  NON FORMULARY Take 1 capsule by mouth See admin instructions. Omega XL 60 Capsules - Green Lipped Mussel [place], Omega 3 Natural Joint Pain Relief & Inflammation Supplement: Take 1 capsule by mouth once a day    [provider]  pantoprazole (PROTONIX) 40 MG tablet Take 40 mg by mouth daily before breakfast.    [provider]  Tiotropium Bromide Monohydrate (SPIRIVA RESPIMAT) 2.5 MCG/ACT AERS Inhale 2 puffs into the lungs daily. 03/15/20   Isaac Bliss, Rayford Halsted, MD  traZODone (DESYREL) 50 MG tablet TAKE 1 TO 2 TABLETS AT BEDTIME AS NEEDED 03/28/20   Isaac Bliss, Rayford Halsted, MD    ___________________________________________________________________________________________________ Physical Exam: Vitals with BMI 04/16/2021 04/16/2021 04/16/2021  Height - - -  Weight - - -  BMI - - -  Systolic [id number]  Diastolic 70 69 80  Pulse 67 60 59     1. General:  in No  Acute distress   Chronically ill -appearing 2. Psychological: Alert and  Oriented 3. Head/ENT:    Dry Mucous Membranes                          Head Non traumatic, neck supple                          Poor Dentition 4. SKIN: normal  Skin turgor,  Skin clean Dry and intact no rash 5. Heart: Regular rate and rhythm no Murmur, no Rub or gallop 6. Lungs:  wheezes and some crackles  in AM 7. Abdomen: Soft,  non-tender, Non distended bowel sounds present 8. Lower extremities: no clubbing, cyanosis, n o edema 9. Neurologically Grossly intact, moving all 4 extremities equally  intact 10. MSK: Normal range of motion    Chart has been reviewed  ______________________________________________________________________________________________  Assessment/Plan  80 y.o. male with medical history significant of ESRD, anemia vascular dementia Alcohol abuse, Anemia, Chronic heart failure with preserved ejection fraction  COPD, CVA, esophagitis, GIB, HTN,  p A.fib, pulmonary hypertension, tobacco abuse   Admitted for symptomatic anemia and need for HD  Present on Admission:  Symptomatic anemia-transfuse 2 units.  Given Hemoccult positive stool probably would benefit from establishing care with GI as well. [facility] consulted  will see in AM will start on PPI BID and npo post midnight  Copd with acute exacerbation - Will initiate: Steroid taper  -  Antibiotics  Doxycycline, - Albuterol  PRN, - scheduled duoneb,  -  Breo or Dulera at discharge   -  Mucinex.  Titrate O2 to saturation >90%. Follow patients respiratory status.  Currently mentating well no evidence of symptomatic hypercarbia  End-stage renal disease -nephrology aware plan for hemodialysis tomorrow   Tobacco use disorder -  - Spoke about importance of quitting spent 5 minutes discussing options for treatment, prior attempts at quitting, and dangers of smoking  -At this point patient is   interested in quitting  - refused nicotine patch   - nursing tobacco cessation protocol   PAF (paroxysmal atrial fibrillation) ([place]) -hold anticoagulation, given bradycardia initially hold Coreg for tonight and monitor   HTN (hypertension) -given need for hemodialysis and evidence of GI blood loss will allow permissive hypertension for tonight resume medications as able to tolerate   Chronic diastolic heart failure ([place]) -fluid status as per nephrology   CAD (coronary artery disease) -hold aspirin given Hemoccult positive stools and worsening anemia.  Continue Lipitor hold Coreg for tonight given bradycardia   HLD (hyperlipidemia) -continue lipid   Prolonged QT interval - - will monitor on tele avoid QT prolonging medications, rehydrate correct electrolytes   Abnormal ECG -endorsing shortness of breath which is most likely secondary to anemia.  Will add on troponin   Dementia without behavioral disturbance (HCC) -chronic stable monitor for any sign of sundowning  Recent covid infection -  still tests positive but infection was initially diagnosed in 03/16/2021 Pt did not require o2 at the time and had only minimal respiratory symptoms   No indications for precautions at this time  Other plan as per orders.  DVT prophylaxis:  SCD     Code Status:  Code Status: Prior FULL CODE  as per patient   I had personally discussed CODE STATUS with patient     Family Communication:   Family not at  Bedside   Disposition Plan:       To home once workup is complete and patient is stable   Following barriers for discharge:                            Electrolytes corrected                               Anemia corrected                                                         Will need consultants to evaluate patient prior to discharge                    Would benefit from PT/OT eval prior to DC  Ordered                   Swallow eval - SLP ordered                                       Transition of care consulted                   Nutrition    consulted                     Consults called: nephrology is aware  Admission status:  ED Disposition     ED Disposition  [place]: [place] [[id number]]  Level of Care: Telemetry Medical [[id number]]  May admit patient to [facility] or [facility] if equivalent level of care is available:: Yes  Covid Evaluation: Asymptomatic Screening Protocol (No Symptoms)  Diagnosis: Anemia [id number]  Admitting Physician: Toy Baker [[id number]]  Attending Physician: Toy Baker [[id number]]  Estimated length of stay: past midnight tomorrow  Certification:: I certify this patient will need inpatient services for at least 2 midnights            inpatient     I Expect 2 midnight stay secondary to severity of patient's current illness need for inpatient interventions justified by the following:  hemodynamic instability despite optimal treatment (bradycardia)  Severe lab/radiological/exam  abnormalities including:    ESRD  and extensive comorbidities including:  CHF  CAD  COPD/asthma ESRD   That are currently affecting medical management.   I expect  patient to be hospitalized for 2 midnights requiring inpatient medical care.  Patient is at high risk for adverse outcome (such as loss of life or disability) if not treated.  Indication for inpatient stay as follows:    Hemodynamic instability despite maximal medical therapy,     Need for IV antibiotics,  blood transfusion    Level of care     tele  For  24H           Precautions:   Tested positive for COVID still but infection was >1  m ago  Current respiratory symptoms likely due to COPD exacerbation    PPE: Used by the provider:   N95  eye Goggles,  Gloves      Jazz Biddy 04/16/2021, 8:53 PM   [facility]     after 2 AM please page floor coverage PA If 7AM-7PM, please contact the day team taking care of the patient using [email or web address]   Patient was evaluated in the context of the global COVID-19 pandemic, which necessitated consideration that the patient might be at risk for infection with the SARS-CoV-2 virus that causes COVID-19. Institutional protocols and algorithms that pertain to the evaluation of patients at risk for COVID-19 are in a state of rapid change based on information released by regulatory bodies including the CDC and federal and state organizations. These policies and algorithms were followed during the patient's care.

## 2021-04-16 NOTE — ED Provider Notes (Signed)
Emergency Medicine Provider Triage Evaluation Note  Anthony [name] , a 81 y.o. male  was evaluated in triage.  Pt complains of needing HD.  From [place], moving to area. He does not have a place for HD yet. R chest HD cath in place. Typically has HD Tu/Th/Sa, last session Saturday. Reports mild SOB.   Review of Systems  Positive: SOB Negative: Leg swelling  Physical Exam  BP 126/80   Pulse (!) 59   Temp (!) 97.5 F (36.4 C) (Oral)   Resp 18   SpO2 99%  Gen:   Awake, no distress   Resp:  Normal effort  MSK:   Moves extremities without difficulty  Other:  No resp distress, R chest HD cath in place  Medical Decision Making  Medically screening exam initiated at 10:46 AM.  Appropriate orders placed.  Daxtin Desnoyers was informed that the remainder of the evaluation will be completed by another provider, this initial triage assessment does not replace that evaluation, and the importance of remaining in the ED until their evaluation is complete.     Carlisle Cater, PA-C 04/16/21 1047    Lajean Saver, MD 04/18/21 1309

## 2021-04-16 NOTE — ED Triage Notes (Signed)
Pt just moved to [place] from [place] and is trying to get set up with a PCP and dialysis center. T, Th, S dialysis patient, last full tx on Saturday. Endorses shob.

## 2021-04-17 ENCOUNTER — Inpatient Hospital Stay (HOSPITAL_COMMUNITY): Payer: Medicare Other

## 2021-04-17 DIAGNOSIS — D500 Iron deficiency anemia secondary to blood loss (chronic): Secondary | ICD-10-CM | POA: Diagnosis not present

## 2021-04-17 DIAGNOSIS — F0390 Unspecified dementia without behavioral disturbance: Secondary | ICD-10-CM | POA: Diagnosis not present

## 2021-04-17 DIAGNOSIS — R0609 Other forms of dyspnea: Secondary | ICD-10-CM | POA: Diagnosis not present

## 2021-04-17 DIAGNOSIS — I5032 Chronic diastolic (congestive) heart failure: Secondary | ICD-10-CM | POA: Diagnosis not present

## 2021-04-17 DIAGNOSIS — J441 Chronic obstructive pulmonary disease with (acute) exacerbation: Secondary | ICD-10-CM | POA: Diagnosis not present

## 2021-04-17 DIAGNOSIS — D649 Anemia, unspecified: Secondary | ICD-10-CM | POA: Diagnosis not present

## 2021-04-17 LAB — CBC WITH DIFFERENTIAL/PLATELET
Abs Immature Granulocytes: 0.04 10*3/uL (ref 0.00–0.07)
Basophils Absolute: 0 10*3/uL (ref 0.0–0.1)
Basophils Relative: 0 %
Eosinophils Absolute: 0 10*3/uL (ref 0.0–0.5)
Eosinophils Relative: 0 %
HCT: 27.9 % — ABNORMAL LOW (ref 39.0–52.0)
Hemoglobin: 8.5 g/dL — ABNORMAL LOW (ref 13.0–17.0)
Immature Granulocytes: 1 %
Lymphocytes Relative: 12 %
Lymphs Abs: 0.6 10*3/uL — ABNORMAL LOW (ref 0.7–4.0)
MCH: 29.9 pg (ref 26.0–34.0)
MCHC: 30.5 g/dL (ref 30.0–36.0)
MCV: 98.2 fL (ref 80.0–100.0)
Monocytes Absolute: 0.2 10*3/uL (ref 0.1–1.0)
Monocytes Relative: 4 %
Neutro Abs: 4.1 10*3/uL (ref 1.7–7.7)
Neutrophils Relative %: 83 %
Platelets: 179 10*3/uL (ref 150–400)
RBC: 2.84 MIL/uL — ABNORMAL LOW (ref 4.22–5.81)
RDW: 18.8 % — ABNORMAL HIGH (ref 11.5–15.5)
WBC: 4.9 10*3/uL (ref 4.0–10.5)
nRBC: 0 % (ref 0.0–0.2)

## 2021-04-17 LAB — COMPREHENSIVE METABOLIC PANEL
ALT: 22 U/L (ref 0–44)
AST: 22 U/L (ref 15–41)
Albumin: 3.3 g/dL — ABNORMAL LOW (ref 3.5–5.0)
Alkaline Phosphatase: 56 U/L (ref 38–126)
Anion gap: 15 (ref 5–15)
BUN: 56 mg/dL — ABNORMAL HIGH (ref 8–23)
CO2: 25 mmol/L (ref 22–32)
Calcium: 8.9 mg/dL (ref 8.9–10.3)
Chloride: 101 mmol/L (ref 98–111)
Creatinine, Ser: 13.12 mg/dL — ABNORMAL HIGH (ref 0.61–1.24)
GFR, Estimated: 4 mL/min — ABNORMAL LOW (ref >60)
Glucose, Bld: 126 mg/dL — ABNORMAL HIGH (ref 70–99)
Potassium: 4.4 mmol/L (ref 3.5–5.1)
Sodium: 141 mmol/L (ref 135–145)
Total Bilirubin: 0.5 mg/dL (ref 0.3–1.2)
Total Protein: 6.6 g/dL (ref 6.5–8.1)

## 2021-04-17 LAB — IRON AND TIBC
Iron: 66 ug/dL (ref 45–182)
Saturation Ratios: 29 % (ref 17.9–39.5)
TIBC: 231 ug/dL — ABNORMAL LOW (ref 250–450)
UIBC: 165 ug/dL

## 2021-04-17 LAB — HEPATITIS B SURFACE ANTIGEN: Hepatitis B Surface Ag: NONREACTIVE

## 2021-04-17 LAB — FERRITIN: Ferritin: 182 ng/mL (ref 24–336)

## 2021-04-17 LAB — ETHANOL: Alcohol, Ethyl (B): <10 mg/dL (ref <10)

## 2021-04-17 LAB — HEPATIC FUNCTION PANEL
ALT: 22 U/L (ref 0–44)
AST: 21 U/L (ref 15–41)
Albumin: 3.2 g/dL — ABNORMAL LOW (ref 3.5–5.0)
Alkaline Phosphatase: 55 U/L (ref 38–126)
Bilirubin, Direct: <0.1 mg/dL (ref 0.0–0.2)
Total Bilirubin: 0.5 mg/dL (ref 0.3–1.2)
Total Protein: 6.8 g/dL (ref 6.5–8.1)

## 2021-04-17 LAB — ECHOCARDIOGRAM COMPLETE
Area-P 1/2: 3.4 cm2
MV VTI: 1.88 cm2
S' Lateral: 3.8 cm

## 2021-04-17 LAB — HEPATITIS B CORE ANTIBODY, TOTAL: Hep B Core Total Ab: REACTIVE — AB

## 2021-04-17 LAB — PHOSPHORUS: Phosphorus: 3.3 mg/dL (ref 2.5–4.6)

## 2021-04-17 LAB — RETICULOCYTES
Immature Retic Fract: 20.1 % — ABNORMAL HIGH (ref 2.3–15.9)
RBC.: 2.74 MIL/uL — ABNORMAL LOW (ref 4.22–5.81)
Retic Count, Absolute: 109.1 10*3/uL (ref 19.0–186.0)
Retic Ct Pct: 4 % — ABNORMAL HIGH (ref 0.4–3.1)

## 2021-04-17 LAB — FOLATE: Folate: 7.2 ng/mL (ref >5.9)

## 2021-04-17 LAB — SARS CORONAVIRUS 2 (TAT 6-24 HRS): SARS Coronavirus 2: POSITIVE — AB

## 2021-04-17 LAB — MAGNESIUM: Magnesium: 2.4 mg/dL (ref 1.7–2.4)

## 2021-04-17 LAB — TROPONIN I (HIGH SENSITIVITY): Troponin I (High Sensitivity): 85 ng/L — ABNORMAL HIGH (ref <18)

## 2021-04-17 LAB — HIV ANTIBODY (ROUTINE TESTING W REFLEX): HIV Screen 4th Generation wRfx: NONREACTIVE

## 2021-04-17 LAB — TSH: TSH: 0.458 u[IU]/mL (ref 0.350–4.500)

## 2021-04-17 LAB — VITAMIN B12: Vitamin B-12: 483 pg/mL (ref 180–914)

## 2021-04-17 MED ORDER — HEPARIN SODIUM (PORCINE) 1000 UNIT/ML IJ SOLN
INTRAMUSCULAR | Status: AC
  Filled 2021-04-17: qty 3

## 2021-04-17 MED ORDER — CARVEDILOL 6.25 MG PO TABS
6.2500 mg | ORAL_TABLET | Freq: Two times a day (BID) | ORAL | Status: DC
  Administered 2021-04-17 – 2021-04-24 (×10): 6.25 mg via ORAL
  Filled 2021-04-17 (×10): qty 1

## 2021-04-17 MED ORDER — HEPARIN SODIUM (PORCINE) 1000 UNIT/ML DIALYSIS: 1000.0000 [IU] | INTRAMUSCULAR | Status: DC | PRN

## 2021-04-17 MED ORDER — PANTOPRAZOLE SODIUM 40 MG PO TBEC
40.0000 mg | DELAYED_RELEASE_TABLET | Freq: Every day | ORAL | Status: DC
  Administered 2021-04-18 – 2021-04-24 (×7): 40 mg via ORAL
  Filled 2021-04-17 (×8): qty 1

## 2021-04-17 MED ORDER — NEPRO/CARBSTEADY PO LIQD
237.0000 mL | ORAL | Status: DC
Start: 2021-04-17 — End: 2021-04-24
  Administered 2021-04-17 – 2021-04-24 (×2): 237 mL via ORAL

## 2021-04-17 MED ORDER — RENA-VITE PO TABS
1.0000 | ORAL_TABLET | Freq: Every day | ORAL | Status: DC
  Administered 2021-04-17 – 2021-04-23 (×7): 1 via ORAL
  Filled 2021-04-17 (×7): qty 1

## 2021-04-17 MED ORDER — MOMETASONE FURO-FORMOTEROL FUM 200-5 MCG/ACT IN AERO
2.0000 | INHALATION_SPRAY | Freq: Two times a day (BID) | RESPIRATORY_TRACT | Status: DC
Start: 2021-04-17 — End: 2021-04-24
  Administered 2021-04-17 – 2021-04-24 (×13): 2 via RESPIRATORY_TRACT
  Filled 2021-04-17: qty 8.8

## 2021-04-17 MED ORDER — AMLODIPINE BESYLATE 5 MG PO TABS
5.0000 mg | ORAL_TABLET | Freq: Every day | ORAL | Status: DC
  Administered 2021-04-17: 5 mg via ORAL
  Filled 2021-04-17 (×2): qty 1

## 2021-04-17 MED ORDER — SODIUM CHLORIDE 0.9 % IV SOLN
510.0000 mg | Freq: Once | INTRAVENOUS | Status: AC
  Administered 2021-04-17: 510 mg via INTRAVENOUS
  Filled 2021-04-17: qty 17

## 2021-04-17 MED ORDER — SODIUM CHLORIDE 0.9 % IV SOLN
100.0000 mL | INTRAVENOUS | Status: DC | PRN
Start: 2021-04-17 — End: 2021-04-17

## 2021-04-17 MED ORDER — MELATONIN 3 MG PO TABS
3.0000 mg | ORAL_TABLET | Freq: Every day | ORAL | Status: DC
  Administered 2021-04-17 – 2021-04-23 (×7): 3 mg via ORAL
  Filled 2021-04-17 (×7): qty 1

## 2021-04-17 MED ORDER — CALCIUM ACETATE (PHOS BINDER) 667 MG PO CAPS
2001.0000 mg | ORAL_CAPSULE | Freq: Three times a day (TID) | ORAL | Status: DC
  Administered 2021-04-17 – 2021-04-21 (×12): 2001 mg via ORAL
  Filled 2021-04-17 (×12): qty 3

## 2021-04-17 MED ORDER — ISOSORBIDE MONONITRATE ER 30 MG PO TB24
30.0000 mg | ORAL_TABLET | Freq: Every day | ORAL | Status: DC
  Administered 2021-04-17 – 2021-04-24 (×7): 30 mg via ORAL
  Filled 2021-04-17 (×7): qty 1

## 2021-04-17 MED ORDER — TIOTROPIUM BROMIDE MONOHYDRATE 2.5 MCG/ACT IN AERS: 2.0000 | INHALATION_SPRAY | Freq: Every day | RESPIRATORY_TRACT | Status: DC

## 2021-04-17 MED ORDER — LIDOCAINE HCL (PF) 1 % IJ SOLN: 5.0000 mL | INTRAMUSCULAR | Status: DC | PRN

## 2021-04-17 MED ORDER — CHLORHEXIDINE GLUCONATE CLOTH 2 % EX PADS
6.0000 | MEDICATED_PAD | Freq: Every day | CUTANEOUS | Status: DC
  Administered 2021-04-18 – 2021-04-24 (×5): 6 via TOPICAL

## 2021-04-17 MED ORDER — ATORVASTATIN CALCIUM 10 MG PO TABS
20.0000 mg | ORAL_TABLET | Freq: Every day | ORAL | Status: DC
  Administered 2021-04-17 – 2021-04-24 (×8): 20 mg via ORAL
  Filled 2021-04-17 (×8): qty 2

## 2021-04-17 MED ORDER — KETOTIFEN FUMARATE 0.025 % OP SOLN
1.0000 [drp] | Freq: Two times a day (BID) | OPHTHALMIC | Status: DC
  Administered 2021-04-17 – 2021-04-24 (×15): 1 [drp] via OPHTHALMIC
  Filled 2021-04-17: qty 5

## 2021-04-17 MED ORDER — ALTEPLASE 2 MG IJ SOLR: 2.0000 mg | Freq: Once | INTRAMUSCULAR | Status: DC | PRN

## 2021-04-17 MED ORDER — MEMANTINE HCL 5 MG PO TABS
5.0000 mg | ORAL_TABLET | Freq: Every day | ORAL | Status: DC
  Administered 2021-04-17 – 2021-04-24 (×8): 5 mg via ORAL
  Filled 2021-04-17 (×8): qty 1

## 2021-04-17 MED ORDER — LIDOCAINE-PRILOCAINE 2.5-2.5 % EX CREA: 1.0000 "application " | TOPICAL_CREAM | CUTANEOUS | Status: DC | PRN

## 2021-04-17 MED ORDER — PENTAFLUOROPROP-TETRAFLUOROETH EX AERO: 1.0000 "application " | INHALATION_SPRAY | CUTANEOUS | Status: DC | PRN

## 2021-04-17 MED ORDER — QUETIAPINE FUMARATE 25 MG PO TABS
25.0000 mg | ORAL_TABLET | Freq: Every evening | ORAL | Status: DC | PRN
  Administered 2021-04-22 – 2021-04-23 (×2): 25 mg via ORAL
  Filled 2021-04-17 (×2): qty 1

## 2021-04-17 MED ORDER — DOXYCYCLINE HYCLATE 100 MG PO TABS
100.0000 mg | ORAL_TABLET | Freq: Two times a day (BID) | ORAL | Status: DC
  Administered 2021-04-17 – 2021-04-19 (×5): 100 mg via ORAL
  Filled 2021-04-17 (×6): qty 1

## 2021-04-17 MED ORDER — UMECLIDINIUM BROMIDE 62.5 MCG/INH IN AEPB
1.0000 | INHALATION_SPRAY | Freq: Every day | RESPIRATORY_TRACT | Status: DC
  Administered 2021-04-18 – 2021-04-24 (×6): 1 via RESPIRATORY_TRACT
  Filled 2021-04-17: qty 7

## 2021-04-17 MED ORDER — HYDRALAZINE HCL 50 MG PO TABS
100.0000 mg | ORAL_TABLET | Freq: Two times a day (BID) | ORAL | Status: DC
  Administered 2021-04-17 – 2021-04-23 (×12): 100 mg via ORAL
  Filled 2021-04-17 (×12): qty 2

## 2021-04-17 NOTE — Consult Note (Signed)
Reason for Consult: ESRD Referring Physician:  Dr. Roel Cluck  Chief Complaint: Needs HD   Dialysis orders: 4hr 2/2 bath 180 NRe RIJ TC EDW appears to be 65 kg based on review of past few treatments Calcitriol 1.5 mcg PO TIW Mircera 75 q2 weeks (last 7/19)  Assessment/Plan: ESRD - moved here from [place] prior to being accepted at a unit. Last HD was on Saturday.  - Plan on dialyzing him today and then Fri + Sat - Notified renal navigator for placement. - Restrict right arm for future dialysis access; vein mapping and then will call [facility]. Renal ostedystrophy  - Will phos for management but restart Phoslo 3 tabs TIDM; he's had issues with compliance in the past. Anemia - Last Mircera given 7/19)  - Will give one dose of Feraheme. HTN - restart home meds and will adjust as needed + UF on HD. COVID a month ago - currently asymptomatic except mild nonproductive cough; was not treated but he has been vaccinated + booster. CASHD -> last cath 03/04/21 followed by cards in [place] COPD Afib not an anticoag bec of fall risk and h/o bleeding; being rate controlled.   HPI: Anthony [name] is an 80 y.o. male HTN Afib COPD CASHD ESRD TTS in [place] with last treatment on Sat. He moved before being accepted at any unit here in [place]. Recent COVID infection a month ago but mild and not treated; he has been vaccinated with booster as well. He denies dyspnea but has a intermittent nonproductive cough; he denies fever, chills, nausea, myalgias, diarrhea, abdominal pain or chest pain.  ROS Pertinent items are noted in HPI.  Chemistry and CBC: Creatinine, Ser  Date/Time Value Ref Range Status  04/17/2021 06:20 AM 13.12 (H) 0.61 - 1.24 mg/dL Final  04/16/2021 10:46 AM 12.20 (H) 0.61 - 1.24 mg/dL Final  03/02/2020 07:58 AM 12.04 (H) 0.61 - 1.24 mg/dL Final  02/29/2020 04:45 PM 8.56 (H) 0.61 - 1.24 mg/dL Final  02/29/2020 02:08 PM 7.92 (H) 0.61 - 1.24 mg/dL Final   Recent Labs  Lab  04/16/21 1046 04/17/21 0620  NA 143 141  K 3.8 4.4  CL 103 101  CO2 28 25  GLUCOSE 124* 126*  BUN 50* 56*  CREATININE 12.20* 13.12*  CALCIUM 9.2 8.9  PHOS  --  3.3   Recent Labs  Lab 04/16/21 1046 04/17/21 0620  WBC 4.0 4.9  NEUTROABS 2.8 4.1  HGB 6.5* 8.5*  HCT 21.6* 27.9*  MCV 101.9* 98.2  PLT 212 179   Liver Function Tests: Recent Labs  Lab 04/17/21 0620  AST 21  22  ALT 22  22  ALKPHOS 55  56  BILITOT 0.5  0.5  PROT 6.8  6.6  ALBUMIN 3.2*  3.3*   No results for input(s): LIPASE, AMYLASE in the last 168 hours. Recent Labs  Lab 04/16/21 2030  AMMONIA 59*   Cardiac Enzymes: No results for input(s): CKTOTAL, CKMB, CKMBINDEX, TROPONINI in the last 168 hours. Iron Studies:  Recent Labs    04/17/21 0620  IRON 66  TIBC 231*  FERRITIN 182   PT/INR: '@LABRCNTIP'$ (inr:5)  Xrays/Other Studies: ) Results for orders placed or performed during the hospital encounter of 04/16/21 (from the past 48 hour(s))  CBC with Differential     Status: Abnormal   Collection Time: 04/16/21 10:46 AM  Result Value Ref Range   WBC 4.0 4.0 - 10.5 K/uL   RBC 2.12 (L) 4.22 - 5.81 MIL/uL   Hemoglobin 6.5 (LL)  13.0 - 17.0 g/dL    Comment: REPEATED TO VERIFY THIS CRITICAL RESULT HAS VERIFIED AND BEEN CALLED TO S. BERTRAND, RN BY JULIE MACEDA DEL ANGEL ON 07 26 2022 AT 64, AND HAS BEEN READ BACK.     HCT 21.6 (L) 39.0 - 52.0 %   MCV 101.9 (H) 80.0 - 100.0 fL   MCH 30.7 26.0 - 34.0 pg   MCHC 30.1 30.0 - 36.0 g/dL   RDW 17.8 (H) 11.5 - 15.5 %   Platelets 212 150 - 400 K/uL   nRBC 0.0 0.0 - 0.2 %   Neutrophils Relative % 69 %   Neutro Abs 2.8 1.7 - 7.7 K/uL   Lymphocytes Relative 17 %   Lymphs Abs 0.7 0.7 - 4.0 K/uL   Monocytes Relative 8 %   Monocytes Absolute 0.3 0.1 - 1.0 K/uL   Eosinophils Relative 4 %   Eosinophils Absolute 0.2 0.0 - 0.5 K/uL   Basophils Relative 1 %   Basophils Absolute 0.0 0.0 - 0.1 K/uL   Immature Granulocytes 1 %   Abs Immature Granulocytes  0.02 0.00 - 0.07 K/uL    Comment: Performed at [facility] [address], [place], [place] [id number]  Basic metabolic panel     Status: Abnormal   Collection Time: 04/16/21 10:46 AM  Result Value Ref Range   Sodium 143 135 - 145 mmol/L   Potassium 3.8 3.5 - 5.1 mmol/L   Chloride 103 98 - 111 mmol/L   CO2 28 22 - 32 mmol/L   Glucose, Bld 124 (H) 70 - 99 mg/dL    Comment: Glucose reference range applies only to samples taken after fasting for at least 8 hours.   BUN 50 (H) 8 - 23 mg/dL   Creatinine, Ser 12.20 (H) 0.61 - 1.24 mg/dL   Calcium 9.2 8.9 - 10.3 mg/dL   GFR, Estimated 4 (L) >60 mL/min    Comment: (NOTE) Calculated using the CKD-EPI Creatinine Equation (2021)    Anion gap 12 5 - 15    Comment: Performed at [facility] [address], [place], [place] [address]  Magnesium     Status: Abnormal   Collection Time: 04/16/21 10:46 AM  Result Value Ref Range   Magnesium 2.5 (H) 1.7 - 2.4 mg/dL    Comment: Performed at [facility] [address], [place], [place] [address]  ABO/Rh     Status: None   Collection Time: 04/16/21 10:46 AM  Result Value Ref Range   ABO/RH(D)      O POS Performed at [facility] [address], [place], [place] [address]   SARS CORONAVIRUS 2 (TAT 6-24 HRS) Nasopharyngeal Nasopharyngeal Swab     Status: Abnormal   Collection Time: 04/16/21  7:06 PM   Specimen: Nasopharyngeal Swab  Result Value Ref Range   SARS Coronavirus 2 POSITIVE (A) NEGATIVE    Comment: (NOTE) SARS-CoV-2 target nucleic acids are DETECTED.  The SARS-CoV-2 RNA is generally detectable in upper and lower respiratory specimens during the acute phase of infection. Positive results are indicative of the presence of SARS-CoV-2 RNA. Clinical correlation with patient history and other diagnostic information is  necessary to determine patient infection status. Positive results do not rule out bacterial infection or co-infection with other viruses.  The  expected result is Negative.  Fact Sheet for Patients: SugarRoll.be  Fact Sheet for Healthcare Providers: https://www.woods-mathews.com/  This test is not yet approved or cleared by the [place]  States FDA and  has been authorized for detection and/or diagnosis of SARS-CoV-2 by FDA under an Emergency Use Authorization (EUA). This EUA will remain  in effect (meaning this test can be used) for the duration of the COVID-19 declaration under Section 564(b)(1) of the Act, 21 U. S.C. section 360bbb-3(b)(1), unless the authorization is terminated or revoked sooner.   Performed at [facility], [place] [address] [place], [place] [address]   Prepare RBC (crossmatch)     Status: None   Collection Time: 04/16/21  7:13 PM  Result Value Ref Range   Order Confirmation      ORDER PROCESSED BY BLOOD BANK Performed at [facility], [place] [address] [place], [place] [address]   Type and screen [place]     Status: None (Preliminary result)   Collection Time: 04/16/21  7:17 PM  Result Value Ref Range   ABO/RH(D) O POS    Antibody Screen NEG    Sample Expiration      04/19/2021,2359 Performed at [facility], [place] [address] [place], [place] [address]    Unit Number [id number]    Blood Component Type RED CELLS,LR    Unit division 00    Status of Unit ISSUED    Transfusion Status OK TO TRANSFUSE    Crossmatch Result Compatible    Unit Number [id number]    Blood Component Type RED CELLS,LR    Unit division 00    Status of Unit ISSUED    Transfusion Status OK TO TRANSFUSE    Crossmatch Result Compatible   POC occult blood, ED     Status: Abnormal   Collection Time: 04/16/21  7:23 PM  Result Value Ref Range   Fecal Occult Bld POSITIVE (A) NEGATIVE  Ammonia     Status: Abnormal   Collection Time: 04/16/21  8:30 PM  Result Value Ref Range   Ammonia 59 (H) 9 - 35 umol/L    Comment: Performed at [facility], [place] [address] [place], [place] [address]  Hepatic function panel     Status: Abnormal   Collection Time: 04/17/21  6:20 AM  Result Value Ref Range   Total Protein 6.8 6.5 - 8.1 g/dL   Albumin 3.2 (L) 3.5 - 5.0 g/dL   AST 21 15 - 41 U/L   ALT 22 0 - 44 U/L   Alkaline Phosphatase 55 38 - 126 U/L   Total Bilirubin 0.5 0.3 - 1.2 mg/dL   Bilirubin, Direct <0.1 0.0 - 0.2 mg/dL   Indirect Bilirubin NOT CALCULATED 0.3 - 0.9 mg/dL    Comment: Performed at [facility] [address], [place], [place] [address]  Ethanol     Status: None   Collection Time: 04/17/21  6:20 AM  Result Value Ref Range   Alcohol, Ethyl (B) <10 <10 mg/dL    Comment: (NOTE) Lowest detectable limit for serum alcohol is 10 mg/dL.  For medical purposes only. Performed at [facility], [place] [address], [place], [place] [address]   Magnesium     Status: None   Collection Time: 04/17/21  6:20 AM  Result Value Ref Range   Magnesium 2.4 1.7 - 2.4 mg/dL    Comment: Performed at [facility] [address], [place], [place] [address]  Phosphorus     Status: None   Collection Time: 04/17/21  6:20 AM  Result Value Ref Range   Phosphorus 3.3 2.5 -  4.6 mg/dL    Comment: Performed at [facility], [place] [address], [place], [place] [address]  CBC WITH DIFFERENTIAL     Status: Abnormal   Collection Time: 04/17/21  6:20 AM  Result Value Ref Range   WBC 4.9 4.0 - 10.5 K/uL   RBC 2.84 (L) 4.22 - 5.81 MIL/uL   Hemoglobin 8.5 (L) 13.0 - 17.0 g/dL    Comment: REPEATED TO VERIFY POST TRANSFUSION SPECIMEN    HCT 27.9 (L) 39.0 - 52.0 %   MCV 98.2 80.0 - 100.0 fL   MCH 29.9 26.0 - 34.0 pg   MCHC 30.5 30.0 - 36.0 g/dL   RDW 18.8 (H) 11.5 - 15.5 %   Platelets 179 150 - 400 K/uL   nRBC 0.0 0.0 - 0.2 %   Neutrophils Relative % 83 %   Neutro Abs 4.1 1.7 - 7.7 K/uL   Lymphocytes Relative 12 %   Lymphs Abs 0.6 (L) 0.7 - 4.0 K/uL   Monocytes Relative 4 %   Monocytes Absolute 0.2 0.1 - 1.0 K/uL    Eosinophils Relative 0 %   Eosinophils Absolute 0.0 0.0 - 0.5 K/uL   Basophils Relative 0 %   Basophils Absolute 0.0 0.0 - 0.1 K/uL   Immature Granulocytes 1 %   Abs Immature Granulocytes 0.04 0.00 - 0.07 K/uL    Comment: Performed at [facility], [place] [address] [place], [place] [address]  TSH     Status: None   Collection Time: 04/17/21  6:20 AM  Result Value Ref Range   TSH 0.458 0.350 - 4.500 uIU/mL    Comment: Performed by a 3rd Generation assay with a functional sensitivity of <=0.01 uIU/mL. Performed at [facility], [place] [address], [place], [place] [address]   Comprehensive metabolic panel     Status: Abnormal   Collection Time: 04/17/21  6:20 AM  Result Value Ref Range   Sodium 141 135 - 145 mmol/L   Potassium 4.4 3.5 - 5.1 mmol/L   Chloride 101 98 - 111 mmol/L   CO2 25 22 - 32 mmol/L   Glucose, Bld 126 (H) 70 - 99 mg/dL    Comment: Glucose reference range applies only to samples taken after fasting for at least 8 hours.   BUN 56 (H) 8 - 23 mg/dL   Creatinine, Ser 13.12 (H) 0.61 - 1.24 mg/dL   Calcium 8.9 8.9 - 10.3 mg/dL   Total Protein 6.6 6.5 - 8.1 g/dL   Albumin 3.3 (L) 3.5 - 5.0 g/dL   AST 22 15 - 41 U/L   ALT 22 0 - 44 U/L   Alkaline Phosphatase 56 38 - 126 U/L   Total Bilirubin 0.5 0.3 - 1.2 mg/dL   GFR, Estimated 4 (L) >60 mL/min    Comment: (NOTE) Calculated using the CKD-EPI Creatinine Equation (2021)    Anion gap 15 5 - 15    Comment: Performed at [facility] [address] [place], [place] [address]  Vitamin B12     Status: None   Collection Time: 04/17/21  6:20 AM  Result Value Ref Range   Vitamin B-12 483 180 - 914 pg/mL    Comment: (NOTE) This assay is not validated for testing neonatal or myeloproliferative syndrome specimens for Vitamin B12 levels. Performed at [facility], [place] [address] [place], [place] [address]   Folate     Status: None   Collection Time: 04/17/21  6:20 AM  Result Value Ref Range   Folate  7.2 >5.9 ng/mL    Comment: Performed at [facility], [place] [address] [place], [place] [address]  Iron and TIBC     Status: Abnormal   Collection Time: 04/17/21  6:20 AM  Result Value Ref Range   Iron 66 45 - 182 ug/dL   TIBC 231 (L) 250 - 450 ug/dL   Saturation Ratios 29 17.9 - 39.5 %   UIBC 165 ug/dL    Comment: Performed at [facility] [address] [place], [place] [address]  Ferritin     Status: None   Collection Time: 04/17/21  6:20 AM  Result Value Ref Range   Ferritin 182 24 - 336 ng/mL    Comment: Performed at [facility], [place] [address] [place], [place] [address]  Reticulocytes     Status: Abnormal   Collection Time: 04/17/21  6:20 AM  Result Value Ref Range   Retic Ct Pct 4.0 (H) 0.4 - 3.1 %   RBC. 2.74 (L) 4.22 - 5.81 MIL/uL   Retic Count, Absolute 109.1 19.0 - 186.0 K/uL   Immature Retic Fract 20.1 (H) 2.3 - 15.9 %    Comment: Performed at [facility] [address] [place], [place] [address]  Troponin I (High Sensitivity)     Status: Abnormal   Collection Time: 04/17/21  6:20 AM  Result Value Ref Range   Troponin I (High Sensitivity) 85 (H) <18 ng/L    Comment: (NOTE) Elevated high sensitivity troponin I (hsTnI) values and significant  changes across serial measurements may suggest ACS but many other  chronic and acute conditions are known to elevate hsTnI results.  Refer to the Links section for chest pain algorithms and additional  guidance. Performed at [facility], [place] [address] [place],  [address]    DG Chest Port 1 View  Result Date: 04/16/2021 CLINICAL DATA:  Shortness of breath. EXAM: PORTABLE CHEST 1 VIEW COMPARISON:  February 29, 2020. FINDINGS: The heart size and mediastinal contours are within normal limits. Interval placement of right internal jugular dialysis catheter with distal tip in expected position of the SVC. No pneumothorax or pleural effusion is noted. Right lung is clear. Mild left basilar  subsegmental atelectasis or scarring is noted. The visualized skeletal structures are unremarkable. IMPRESSION: Mild left basilar subsegmental atelectasis or scarring. Aortic Atherosclerosis (ICD10-I70.0). Electronically Signed   By: Marijo Conception M.D.   On: 04/16/2021 19:27    PMH:   Past Medical History:  Diagnosis Date   Alcohol abuse    Arthritis    CHF (congestive heart failure) (HCC)    Chicken pox    Chronic kidney disease    ESRD (end stage renal disease) on dialysis (HCC)    GI bleed    Hypertension    Insomnia    Stroke (HCC)     PSH:  No past surgical history on file.  Allergies: No Known Allergies  Medications:   Prior to Admission medications   Medication Sig Start Date End Date Taking? Authorizing Provider  acetaminophen (TYLENOL) 500 MG tablet Take 1,000 mg by mouth every 8 (eight) hours as needed for moderate pain. 03/17/21  Yes [provider]  albuterol (PROVENTIL HFA) 108 (90 Base) MCG/ACT inhaler Inhale 2 puffs into the lungs every 4 (four) hours as needed for wheezing or shortness of breath. 03/08/20  Yes Laurey Morale, MD  amLODipine (NORVASC) 5 MG tablet  Take 5 mg by mouth daily. 04/13/21  Yes [provider]  ASPIRIN LOW DOSE 81 MG chewable tablet Chew 81 mg by mouth daily. 03/05/21  Yes [provider]  atorvastatin (LIPITOR) 20 MG tablet TAKE 1 TABLET BY MOUTH EVERYDAY AT BEDTIME Patient taking differently: Take 20 mg by mouth daily. 07/30/20  Yes Isaac Bliss, Rayford Halsted, MD  budesonide-formoterol [facility]) 160-4.5 MCG/ACT inhaler Inhale 2 puffs into the lungs 2 (two) times daily. 04/12/21  Yes [provider]  calcium acetate (PHOSLO) 667 MG capsule Take 2,001 mg by mouth 3 (three) times daily with meals.   Yes [provider]  carboxymethylcellulose (REFRESH PLUS) 0.5 % SOLN Place 1 drop into both eyes daily as needed (dry eyes). 12/07/20  Yes [provider]  carvedilol (COREG) 6.25 MG tablet Take 1  tablet (6.25 mg total) by mouth 2 (two) times daily with a meal. 03/02/20  Yes Black, Lezlie Octave, NP  hydrALAZINE (APRESOLINE) 100 MG tablet Take 100 mg by mouth 2 (two) times daily. 07/31/19  Yes [provider]  isosorbide mononitrate (IMDUR) 30 MG 24 hr tablet Take 30 mg by mouth daily. 04/02/21  Yes [provider]  ketotifen (ZADITOR) 0.025 % ophthalmic solution Place 1 drop into both eyes 2 (two) times daily. 12/07/20  Yes [provider]  memantine (NAMENDA) 5 MG tablet Take 5 mg by mouth daily. 03/12/21  Yes [provider]  methocarbamol (ROBAXIN) 500 MG tablet Take 1 tablet (500 mg total) by mouth every 8 (eight) hours as needed for muscle spasms. 12/21/19  Yes Lucrezia Starch, MD  pantoprazole (PROTONIX) 40 MG tablet Take 40 mg by mouth daily before breakfast.   Yes [provider]  predniSONE (DELTASONE) 20 MG tablet Take 20 mg by mouth 2 (two) times daily with a meal. 04/12/21  Yes [provider]  Tiotropium Bromide Monohydrate (SPIRIVA RESPIMAT) 2.5 MCG/ACT AERS Inhale 2 puffs into the lungs daily. 03/15/20  Yes Isaac Bliss, Rayford Halsted, MD  ADVAIR [facility] [phone] MCG/ACT inhaler INHALE 2 PUFFS INTO THE LUNGS IN THE MORNING AND AT BEDTIME Patient not taking: No sig reported 01/17/21   Isaac Bliss, Rayford Halsted, MD  nicotine (NICODERM CQ - DOSED IN MG/24 HOURS) 14 mg/24hr patch Place 1 patch (14 mg total) onto the skin daily. Patient not taking: No sig reported 03/15/20   Isaac Bliss, Rayford Halsted, MD  traZODone (DESYREL) 50 MG tablet TAKE 1 TO 2 TABLETS AT BEDTIME AS NEEDED Patient not taking: No sig reported 03/28/20   Isaac Bliss, Rayford Halsted, MD    Discontinued Meds:   Medications Discontinued During This Encounter  Medication Reason   acetaminophen (TYLENOL) 325 MG tablet Patient Preference   atorvastatin (LIPITOR) 20 MG tablet Patient Preference   hydrALAZINE (APRESOLINE) 50 MG tablet Patient Preference   NON FORMULARY Patient  Preference   oxyCODONE (OXY IR/ROXICODONE) 5 MG immediate release tablet Patient Preference   pantoprazole (PROTONIX) 40 MG tablet Patient Preference    Social History:  reports that he has been smoking. He has never used smokeless tobacco. He reports previous alcohol use. He reports previous drug use.  Family History:   Family History  Problem Relation Age of Onset   Hypertension Mother    Hypertension Father     Blood pressure (!) 193/86, pulse 67, temperature 97.8 F (36.6 C), temperature source Oral, resp. rate 19, SpO2 97 %. General appearance: alert and cooperative Head: Normocephalic, without obvious abnormality, atraumatic Neck: no adenopathy, no carotid  bruit, no JVD, supple, symmetrical, trachea midline, and thyroid not enlarged, symmetric, no tenderness/mass/nodules Back: symmetric, no curvature. ROM normal. No CVA tenderness. Resp: clear to auscultation bilaterally Cardio: irregularly irregular rhythm GI: soft, non-tender; bowel sounds normal; no masses,  no organomegaly Extremities: extremities normal, atraumatic, no cyanosis or edema Pulses: 2+ and symmetric Access: RIJ TC       Dwana Melena, MD 04/17/2021, 9:16 AM

## 2021-04-17 NOTE — Progress Notes (Signed)
[facility] PROGRESS NOTE    Progress Note  Anthony [name]  [id number] DOB: 10/13/1940 DOA: 04/16/2021 PCP: Anthony [name], Anthony Halsted, MD     Brief Narrative:   Anthony [name] is an 80 y.o. male past medical history of essential hypertension, atrial fibrillation not on anticoagulation due to fall risk and history of bleeding, end-stage renal disease at [facility], vascular dementia, alcohol abuse, recent COVID infection but has been vaccinated and boosted.  Presented to the ED after being sent from dialysis as her hemoglobin was found to have a hemoglobin of 6.5, FOBT was positive.    Assessment/Plan:   Symptomatic anemia with a positive FOBT: She status post 2 units of packed red blood cells her hemoglobin this morning is 8.5.  Only on Protonix twice a day, push enteroscopy on 2021 revealed gas Tritus and duodenitis H. pylori negative. GI has been consulted SBE tomorrow okay to eat today.  COPD with acute exacerbation: Started on antibiotic doxycycline, steroids and inhalers. Currently satting greater 90% on room air. Ambulate and check saturations with ambulation.  Positive serum QuantiFERON: The patient is asymptomatic, chest x-ray showed mild basilar atelectasis versus scarring. Discussed with ID he is currently asymptomatic they will see medicine outpatient.  End-stage renal disease: Nephrology has been consulted for HD today.  Tobacco abuse: She refused nicotine patch.  Paroxysmal atrial fibrillation: Rate controlled on Coreg not on anticoagulation due to fall risk and GI bleed.  Essential hypertension: Uncontrolled elevated today, resume all of her antihypertensive medication.  Chronic diastolic heart failure: Resume Coreg and hydralazine fluid status per renal.  CAD: Hold aspirin Hemoccult positive.  Hyperlipidemia: Excellent continue statins.  Prolonged QTC: Monitor.  Dementia without behavioral disturbances: At risk of  aspiration and acute confusional state melatonin at night Seroquel at bedtime as needed.   DVT prophylaxis: lovenox Family Communication:wife Status is: Inpatient  Remains inpatient appropriate because:Hemodynamically unstable  Dispo: The patient is from: Home              Anticipated d/c is to: Home              Patient currently is not medically stable to d/c.   Difficult to place patient No        Code Status:     Code Status Orders  (From admission, onward)           Start     Ordered   04/16/21 1930  Full code  Continuous        04/16/21 1934           Code Status History     Date Active Date Inactive Code Status Order ID Comments User Context   02/29/2020 1647 03/02/2020 2311 Full Code [id number]  Mckinley Jewel, MD ED         IV Access:   Peripheral IV   Procedures and diagnostic studies:   DG Chest Port 1 View  Result Date: 04/16/2021 CLINICAL DATA:  Shortness of breath. EXAM: PORTABLE CHEST 1 VIEW COMPARISON:  February 29, 2020. FINDINGS: The heart size and mediastinal contours are within normal limits. Interval placement of right internal jugular dialysis catheter with distal tip in expected position of the SVC. No pneumothorax or pleural effusion is noted. Right lung is clear. Mild left basilar subsegmental atelectasis or scarring is noted. The visualized skeletal structures are unremarkable. IMPRESSION: Mild left basilar subsegmental atelectasis or scarring. Aortic Atherosclerosis (ICD10-I70.0). Electronically Signed   By: Bobbe Medico.D.  On: 04/16/2021 19:27   ECHOCARDIOGRAM COMPLETE  Result Date: 04/17/2021    ECHOCARDIOGRAM REPORT   Patient Name:   Anthony [name] Date of Exam: 04/17/2021 Medical Rec #:  [id number]      Height:       66.5 in Accession #:    [id number]     Weight:       137.4 lb Date of Birth:  1941/07/12       BSA:          1.714 m Patient Age:    66 years       BP:           171/101 mmHg Patient Gender: M              HR:            65 bpm. Exam Location:  Inpatient Procedure: 2D Echo, 3D Echo, Color Doppler, Cardiac Doppler and Strain Analysis Indications:    R06.9 DOE  History:        Patient has prior history of Echocardiogram examinations, most                 recent 03/01/2020. CAD, COPD, Arrythmias:Atrial Fibrillation;                 Risk Factors:Hypertension and Dyslipidemia.  Sonographer:    Raquel Sarna Senior RDCS Referring Phys: [place]  Sonographer Comments: Poor parasternal window due to COPD IMPRESSIONS  1. Left ventricular ejection fraction, by estimation, is 50 to 55%. The left ventricle has low normal function. The left ventricle has no regional wall motion abnormalities. There is mild left ventricular hypertrophy. Left ventricular diastolic parameters are consistent with Grade II diastolic dysfunction (pseudonormalization). Elevated left atrial pressure.  2. Right ventricular systolic function is normal. The right ventricular size is normal. There is normal pulmonary artery systolic pressure. The estimated right ventricular systolic pressure is 0000000 mmHg.  3. Left atrial size was moderately dilated.  4. The mitral valve is degenerative. Trivial mitral valve regurgitation. Mild mitral stenosis. The mean mitral valve gradient is 4.0 mmHg at HR 65bpm. MVA 1.9 cm^2 by coninuity equation.  5. The aortic valve was not well visualized. Aortic valve regurgitation is not visualized. No aortic stenosis is present.  6. The inferior vena cava is normal in size with <50% respiratory variability, suggesting right atrial pressure of 8 mmHg. FINDINGS  Left Ventricle: Left ventricular ejection fraction, by estimation, is 50 to 55%. The left ventricle has low normal function. The left ventricle has no regional wall motion abnormalities. The left ventricular internal cavity size was normal in size. There is mild left ventricular hypertrophy. Left ventricular diastolic parameters are consistent with Grade II diastolic dysfunction  (pseudonormalization). Elevated left atrial pressure. Right Ventricle: The right ventricular size is normal. No increase in right ventricular wall thickness. Right ventricular systolic function is normal. There is normal pulmonary artery systolic pressure. The tricuspid regurgitant velocity is 2.17 m/s, and  with an assumed right atrial pressure of 8 mmHg, the estimated right ventricular systolic pressure is 0000000 mmHg. Left Atrium: Left atrial size was moderately dilated. Right Atrium: Right atrial size was normal in size. Pericardium: There is no evidence of pericardial effusion. Mitral Valve: The mitral valve is degenerative in appearance. Trivial mitral valve regurgitation. Mild mitral valve stenosis. MV peak gradient, 7.1 mmHg. The mean mitral valve gradient is 4.0 mmHg. Tricuspid Valve: The tricuspid valve is normal in structure. Tricuspid valve regurgitation is trivial. Aortic Valve: The aortic  valve was not well visualized. Aortic valve regurgitation is not visualized. No aortic stenosis is present. Pulmonic Valve: The pulmonic valve was not well visualized. Pulmonic valve regurgitation is trivial. Aorta: The aortic root is normal in size and structure. Venous: The inferior vena cava is normal in size with less than 50% respiratory variability, suggesting right atrial pressure of 8 mmHg. IAS/Shunts: The interatrial septum was not well visualized.  LEFT VENTRICLE PLAX 2D LVIDd:         5.00 cm  Diastology LVIDs:         3.80 cm  LV e' medial:    3.81 cm/s LV PW:         1.10 cm  LV E/e' medial:  34.6 LV IVS:        1.10 cm  LV e' lateral:   6.20 cm/s LVOT diam:     2.00 cm  LV E/e' lateral: 21.3 LV SV:         80 LV SV Index:   47 LVOT Area:     3.14 cm                          3D Volume EF:                         3D EF:        48 %                         LV EDV:       181 ml                         LV ESV:       94 ml                         LV SV:        87 ml RIGHT VENTRICLE RV S prime:     13.80 cm/s  TAPSE (M-mode): 2.4 cm LEFT ATRIUM             Index       RIGHT ATRIUM           Index LA diam:        4.90 cm 2.86 cm/m  RA Area:     15.80 cm LA Vol (A2C):   81.2 ml 47.36 ml/m RA Volume:   36.90 ml  21.52 ml/m LA Vol (A4C):   62.5 ml 36.45 ml/m LA Biplane Vol: 75.5 ml 44.04 ml/m  AORTIC VALVE LVOT Vmax:   117.00 cm/s LVOT Vmean:  67.100 cm/s LVOT VTI:    0.254 m  AORTA Ao Root diam: 3.10 cm MITRAL VALVE                TRICUSPID VALVE MV Area (PHT): 3.40 cm     TR Peak grad:   18.8 mmHg MV Area VTI:   1.88 cm     TR Vmax:        217.00 cm/s MV Peak grad:  7.1 mmHg MV Mean grad:  4.0 mmHg     SHUNTS MV Vmax:       1.33 m/s     Systemic VTI:  0.25 m MV Vmean:      101.0 cm/s   Systemic Diam: 2.00 cm MV Decel Time: 223 msec MV E velocity: 132.00  cm/s MV A velocity: 137.00 cm/s MV E/A ratio:  0.96 Oswaldo Milian MD Electronically signed by Oswaldo Milian MD Signature Date/Time: 04/17/2021/12:10:49 PM    Final      Medical Consultants:   None.   Subjective:    Anthony [name] denies any productive cough or fevers at home, mildly short of breath  Objective:    Vitals:   04/17/21 0315 04/17/21 0615 04/17/21 0652 04/17/21 1015  BP: (!) 176/88 (!) 180/90 (!) 193/86 (!) 197/88  Pulse: 74 71 67 63  Resp: 14 (!) '25 19 14  '$ Temp:   97.8 F (36.6 C)   TempSrc:   Oral   SpO2: 98% 97% 97% 96%   SpO2: 96 %  No intake or output data in the 24 hours ending 04/17/21 1230 There were no vitals filed for this visit.  Exam: General exam: In no acute distress. Respiratory system: Good air movement and clear to auscultation. Cardiovascular system: S1 & S2 heard, RRR. No JVD. Gastrointestinal system: Abdomen is nondistended, soft and nontender.  Extremities: No pedal edema. Skin: No rashes, lesions or ulcers Data Reviewed:    Labs: Basic Metabolic Panel: Recent Labs  Lab 04/16/21 1046 04/17/21 0620  NA 143 141  K 3.8 4.4  CL 103 101  CO2 28 25  GLUCOSE 124* 126*  BUN 50*  56*  CREATININE 12.20* 13.12*  CALCIUM 9.2 8.9  MG 2.5* 2.4  PHOS  --  3.3   GFR CrCl cannot be calculated (Unknown ideal weight.). Liver Function Tests: Recent Labs  Lab 04/17/21 0620  AST 21  22  ALT 22  22  ALKPHOS 55  56  BILITOT 0.5  0.5  PROT 6.8  6.6  ALBUMIN 3.2*  3.3*   No results for input(s): LIPASE, AMYLASE in the last 168 hours. Recent Labs  Lab 04/16/21 2030  AMMONIA 59*   Coagulation profile No results for input(s): INR, PROTIME in the last 168 hours. COVID-19 Labs  Recent Labs    04/17/21 0620  FERRITIN 182    Lab Results  Component Value Date   SARSCOV2NAA POSITIVE (A) 04/16/2021   [place] NEGATIVE 02/29/2020    CBC: Recent Labs  Lab 04/16/21 1046 04/17/21 0620  WBC 4.0 4.9  NEUTROABS 2.8 4.1  HGB 6.5* 8.5*  HCT 21.6* 27.9*  MCV 101.9* 98.2  PLT 212 179   Cardiac Enzymes: No results for input(s): CKTOTAL, CKMB, CKMBINDEX, TROPONINI in the last 168 hours. BNP (last 3 results) No results for input(s): PROBNP in the last 8760 hours. CBG: No results for input(s): GLUCAP in the last 168 hours. D-Dimer: No results for input(s): DDIMER in the last 72 hours. Hgb A1c: No results for input(s): HGBA1C in the last 72 hours. Lipid Profile: No results for input(s): CHOL, HDL, LDLCALC, TRIG, CHOLHDL, LDLDIRECT in the last 72 hours. Thyroid function studies: Recent Labs    04/17/21 0620  TSH 0.458   Anemia work up: Recent Labs    04/17/21 0620  VITAMINB12 483  FOLATE 7.2  FERRITIN 182  TIBC 231*  IRON 66  RETICCTPCT 4.0*   Sepsis Labs: Recent Labs  Lab 04/16/21 1046 04/17/21 0620  WBC 4.0 4.9   Microbiology Recent Results (from the past 240 hour(s))  SARS CORONAVIRUS 2 (TAT 6-24 HRS) Nasopharyngeal Nasopharyngeal Swab     Status: Abnormal   Collection Time: 04/16/21  7:06 PM   Specimen: Nasopharyngeal Swab  Result Value Ref Range Status   SARS Coronavirus 2 POSITIVE (A) NEGATIVE Final  Comment:  (NOTE) SARS-CoV-2 target nucleic acids are DETECTED.  The SARS-CoV-2 RNA is generally detectable in upper and lower respiratory specimens during the acute phase of infection. Positive results are indicative of the presence of SARS-CoV-2 RNA. Clinical correlation with patient history and other diagnostic information is  necessary to determine patient infection status. Positive results do not rule out bacterial infection or co-infection with other viruses.  The expected result is Negative.  Fact Sheet for Patients: SugarRoll.be  Fact Sheet for Healthcare Providers: https://www.woods-mathews.com/  This test is not yet approved or cleared by the [place] FDA and  has been authorized for detection and/or diagnosis of SARS-CoV-2 by FDA under an Emergency Use Authorization (EUA). This EUA will remain  in effect (meaning this test can be used) for the duration of the COVID-19 declaration under Section 564(b)(1) of the Act, 21 U. S.C. section 360bbb-3(b)(1), unless the authorization is terminated or revoked sooner.   Performed at [facility], [place] [address], [place], [place] [address]      Medications:    sodium chloride   Intravenous Once   amLODipine  5 mg Oral Daily   atorvastatin  20 mg Oral Daily   calcium acetate  2,001 mg Oral TID WC   Chlorhexidine Gluconate Cloth  6 each Topical Q0600   feeding supplement (NEPRO CARB STEADY)  237 mL Oral Q24H   ipratropium-albuterol  3 mL Nebulization Q6H   ketotifen  1 drop Both Eyes BID   memantine  5 mg Oral Daily   mometasone-formoterol  2 puff Inhalation BID   multivitamin  1 tablet Oral QHS   [START ON 04/18/2021] pantoprazole  40 mg Oral Q0600   [START ON 04/18/2021] predniSONE  40 mg Oral Q breakfast   sodium chloride flush  3 mL Intravenous Q12H   sodium chloride flush  3 mL Intravenous Q12H   Continuous Infusions:  sodium chloride     sodium chloride     doxycycline  (VIBRAMYCIN) IV 100 mg (04/17/21 0952)   ferumoxytol        LOS: 1 day   Charlynne Cousins  [facility]  04/17/2021, 12:30 PM

## 2021-04-17 NOTE — Progress Notes (Signed)
Out Patient Arrangements:  Have been requested to arrange out pt HD. Noted pt moved here w/out HD arrangements. Will need a local address, previous HD clinic info, updated Hep B Surface antigen, & SS number before HD arrangements can be made.   Linus Orn [facility] [phone]

## 2021-04-17 NOTE — ED Notes (Signed)
Report called to RN receiving pt. 

## 2021-04-17 NOTE — Progress Notes (Addendum)
Initial Nutrition Assessment  DOCUMENTATION CODES:  Non-severe (moderate) malnutrition in context of chronic illness  INTERVENTION:  Obtain updated weight.  Add Nepro Shake po daily, each supplement provides 425 kcal and 19 grams protein.  Add Magic cup TID with meals, each supplement provides 290 kcal and 9 grams of protein.  Add Rena-Vite daily.  NUTRITION DIAGNOSIS:  Moderate Malnutrition related to chronic illness (ESRD on HD) as evidenced by mild fat depletion, mild muscle depletion.  GOAL:  Patient will meet greater than or equal to 90% of their needs  MONITOR:  PO intake, Supplement acceptance, Labs, Weight trends, I & O's  REASON FOR ASSESSMENT:  Consult Assessment of nutrition requirement/status  ASSESSMENT:  80 yo male with a PMH of ESRD on HD (TThS), anemia, vascular dementia, EtOH abuse, chronic HF with preserved ejection fraction, COPD, CVA, esophagitis, GIB, HTN, p A-fib, pulmonary HTN, tobacco abuse, and hx of positive PPD who presents with anemia.  Spoke with pt at bedside. He reports that he eats well - reports 2-3 meals daily, sometimes a snack. He enjoys meats and eggs specifically.  Pt reports losing weight, but has recently gained it back. No updated weight in Epic. Per Care Everywhere, pt weighs 65 kg as of 04/12/21. RD to use this weight to estimate needs.  EDW: 65 kg  On exam, pt with mild and moderate depletions.  Recommend adding Nepro shake daily and Magic Cup TID, as well as Rena-Vite daily.  Medications: reviewed; PhosLo TID, Solu-Medrol, Protonix, doxycycline BID via IV  Labs: reviewed; Glucose 126 (H)  NUTRITION - FOCUSED PHYSICAL EXAM: Flowsheet Row Most Recent Value  Orbital Region Moderate depletion  Upper Arm Region Mild depletion  Thoracic and Lumbar Region No depletion  Buccal Region Moderate depletion  Temple Region Moderate depletion  Clavicle Bone Region Mild depletion  Clavicle and Acromion Bone Region Mild depletion   Scapular Bone Region No depletion  Dorsal Hand Moderate depletion  Patellar Region No depletion  Anterior Thigh Region No depletion  Posterior Calf Region No depletion  Edema (RD Assessment) None  Hair Reviewed  Eyes Reviewed  Mouth Reviewed  Skin Reviewed  Nails Reviewed   Diet Order:   Diet Order             Diet renal with fluid restriction Fluid restriction: 1200 mL Fluid; Room service appropriate? Yes; Fluid consistency: Thin  Diet effective now                  EDUCATION NEEDS:  Education needs have been addressed  Skin:  Skin Assessment: Reviewed RN Assessment  Last BM:  PTA/unknown  Height:  Ht Readings from Last 1 Encounters:  03/20/20 5' 6.5" (1.689 m)   Weight:  Wt Readings from Last 1 Encounters:  03/20/20 62.3 kg   BMI:  There is no height or weight on file to calculate BMI.  Estimated Nutritional Needs:  Kcal:  1850-2050 Protein:  75-90 grams Fluid:  >1.85 L  Derrel Nip, RD, LDN (she/her/hers) Registered Dietitian I After-Hours/Weekend Pager # in [place] [employer]

## 2021-04-17 NOTE — H&P (View-Only) (Signed)
[facility] Consult: 9:45 AM 04/17/2021  LOS: 1 day    Referring Provider: Dr Aileen Fass  Primary Care Physician:  Isaac Bliss, Rayford Halsted, MD Primary Gastroenterologist:  Dr. Lorre Munroe in [place].  Unassigned pt    Reason for Consultation:  anemia.  FOBT +   HPI: Anthony [name] is a 80 y.o. male.  Relocated from [place] just a few days ago.  End-stage renal disease on HD TTS for ~ 5 years.  Htn.  CHF.  Renal osteodystrophy.  Vascular dementia.  Breakthrough COVID-19 02/2021 (vaccinated and boosted) was mild and did not require admission or medication..  COPD.  CVA.  A. fib, rate controlled..  Not on anticoagulation due to fall risk and hx of bleeding.  Cervical spondylosis.  Anemia and GI bleeding.  Admission for GI bleed, Hgb 5.9, 3 PRBCs transfused in 05/2020.Marland Kitchen  Being treated with Mircera, last dose was 7/19.  Additionally treated w periodic parenteral iron at HD.   - Colonoscopy 06/2019: diverticulosis, 2 polyps (tubular adenomas, no H GD., stool In entire colon,external and none bleeding mixed hemorrhoids - EGD 05/2020: Z-line irregular, mucosal nodule in esophagus, diffuse erosive gastropathy, erythemous duodenopathy.  Pathology showed mild, chronic inactive gastritis, no H. pylori.  No intestinal metaplasia. - Capsule endoscopy 05/2020: few AVMs in proximal small bowel, did not pass thru SB completely before end of recording - Push enteroscopy 05/2020: z-line irregular, small hiatal hernia, erythematous mucosa in antrum, erythematous duodenopathy, single bleeding angiodysplastic lesion in duodenum treated w/ APC, two non-bleeding angiodysplastic lesions treated w/ APC   6/11 - 03/05/21  admit in [place], atypical chest pain.  Abnormal dobutamine stress test 12/2020.  Cardiac cath 6/13 showed  ostial LAD lesion without significant flow limitation.  50% PDA lesion 60% RCA stenosis and poststenotic aneurysm.  Cardiology recommended medical management started him on Ranexa, Imdur in addition to other previous cardiac/hypertensive meds.  Hgb 7.5. GI curbsided and recommended PPI BID and outpatient follow-up.  Pt continues to take Protonix 40 mg/daily.  No NSAIDs or ASA.  Presented to the ED yesterday morning because he needed dialysis and had failed to make arrangements for transfer of dialysis care to [facility].  Last dialysis was 7/23.  C/O dyspnea. Hgb 6.5 >> 2 PRBCs >> 8.5. Hgb 7.3 on 7/22.   MCV 101.  Iron, ferritin, iron sats, folate, B12 normal.  TIBC low 231. Portable CXR with mild left basilar atelectasis versus scarring, aortic atherosclerosis.  Dialysis access catheter at right SVC.  GI ROS unremarkable.  Has brown to dark brown stools daily.  No BPR or melena.  Great appetite.  No nausea or vomiting.  Thinks he is lost about 4 pounds over the last several weeks.  No dysphagia or choking when he eats.  Previous smoker.  80-pack-year history.  Previous EtOH abuse.  Lives with his wife.    Past Medical History:  Diagnosis Date   Alcohol abuse    Arthritis    CHF (congestive heart failure) (HCC)    Chicken pox    Chronic kidney disease  ESRD (end stage renal disease) on dialysis [facility])    GI bleed    Hypertension    Insomnia    Stroke ([place])     No past surgical history on file.  Prior to Admission medications   Medication Sig Start Date End Date Taking? Authorizing Provider  acetaminophen (TYLENOL) 500 MG tablet Take 1,000 mg by mouth every 8 (eight) hours as needed for moderate pain. 03/17/21  Yes [provider]  albuterol (PROVENTIL HFA) 108 (90 Base) MCG/ACT inhaler Inhale 2 puffs into the lungs every 4 (four) hours as needed for wheezing or shortness of breath. 03/08/20  Yes Laurey Morale, MD  amLODipine (NORVASC) 5 MG tablet Take 5 mg by mouth daily.  04/13/21  Yes [provider]  ASPIRIN LOW DOSE 81 MG chewable tablet Chew 81 mg by mouth daily. 03/05/21  Yes [provider]  atorvastatin (LIPITOR) 20 MG tablet TAKE 1 TABLET BY MOUTH EVERYDAY AT BEDTIME Patient taking differently: Take 20 mg by mouth daily. 07/30/20  Yes Isaac Bliss, Rayford Halsted, MD  budesonide-formoterol [facility]) 160-4.5 MCG/ACT inhaler Inhale 2 puffs into the lungs 2 (two) times daily. 04/12/21  Yes [provider]  calcium acetate (PHOSLO) 667 MG capsule Take 2,001 mg by mouth 3 (three) times daily with meals.   Yes [provider]  carboxymethylcellulose (REFRESH PLUS) 0.5 % SOLN Place 1 drop into both eyes daily as needed (dry eyes). 12/07/20  Yes [provider]  carvedilol (COREG) 6.25 MG tablet Take 1 tablet (6.25 mg total) by mouth 2 (two) times daily with a meal. 03/02/20  Yes Black, Lezlie Octave, NP  hydrALAZINE (APRESOLINE) 100 MG tablet Take 100 mg by mouth 2 (two) times daily. 07/31/19  Yes [provider]  isosorbide mononitrate (IMDUR) 30 MG 24 hr tablet Take 30 mg by mouth daily. 04/02/21  Yes [provider]  ketotifen (ZADITOR) 0.025 % ophthalmic solution Place 1 drop into both eyes 2 (two) times daily. 12/07/20  Yes [provider]  memantine (NAMENDA) 5 MG tablet Take 5 mg by mouth daily. 03/12/21  Yes [provider]  methocarbamol (ROBAXIN) 500 MG tablet Take 1 tablet (500 mg total) by mouth every 8 (eight) hours as needed for muscle spasms. 12/21/19  Yes Lucrezia Starch, MD  pantoprazole (PROTONIX) 40 MG tablet Take 40 mg by mouth daily before breakfast.   Yes [provider]  predniSONE (DELTASONE) 20 MG tablet Take 20 mg by mouth 2 (two) times daily with a meal. 04/12/21  Yes [provider]  Tiotropium Bromide Monohydrate (SPIRIVA RESPIMAT) 2.5 MCG/ACT AERS Inhale 2 puffs into the lungs daily. 03/15/20  Yes Isaac Bliss, Rayford Halsted, MD  ADVAIR [facility] [phone] MCG/ACT  inhaler INHALE 2 PUFFS INTO THE LUNGS IN THE MORNING AND AT BEDTIME Patient not taking: No sig reported 01/17/21   Isaac Bliss, Rayford Halsted, MD  nicotine (NICODERM CQ - DOSED IN MG/24 HOURS) 14 mg/24hr patch Place 1 patch (14 mg total) onto the skin daily. Patient not taking: No sig reported 03/15/20   Isaac Bliss, Rayford Halsted, MD  traZODone (DESYREL) 50 MG tablet TAKE 1 TO 2 TABLETS AT BEDTIME AS NEEDED Patient not taking: No sig reported 03/28/20   Isaac Bliss, Rayford Halsted, MD    Scheduled Meds:  sodium chloride   Intravenous Once   amLODipine  5 mg Oral Daily   atorvastatin  20 mg Oral Daily   calcium acetate  2,001 mg Oral TID WC   Chlorhexidine  Gluconate Cloth  6 each Topical Q0600   ipratropium-albuterol  3 mL Nebulization Q6H   ketotifen  1 drop Both Eyes BID   memantine  5 mg Oral Daily   methylPREDNISolone (SOLU-MEDROL) injection  40 mg Intravenous Q12H   Followed by   Derrill Memo ON 04/18/2021] predniSONE  40 mg Oral Q breakfast   mometasone-formoterol  2 puff Inhalation BID   pantoprazole (PROTONIX) IV  40 mg Intravenous Q12H   sodium chloride flush  3 mL Intravenous Q12H   sodium chloride flush  3 mL Intravenous Q12H   Infusions:  sodium chloride     sodium chloride     doxycycline (VIBRAMYCIN) IV Stopped (04/17/21 0018)   ferumoxytol     PRN Meds: sodium chloride, acetaminophen **OR** acetaminophen, albuterol, albuterol, HYDROcodone-acetaminophen, sodium chloride flush   Allergies as of 04/16/2021   (No Known Allergies)    Family History  Problem Relation Age of Onset   Hypertension Mother    Hypertension Father     Social History   Socioeconomic History   Marital status: Married    Spouse name: Not on file   Number of children: Not on file   Years of education: Not on file   Highest education level: Not on file  Occupational History   Not on file  Tobacco Use   Smoking status: Every Day   Smokeless tobacco: Never  Substance and Sexual Activity    Alcohol use: Not Currently   Drug use: Not Currently   Sexual activity: Not on file  Other Topics Concern   Not on file  Social History Narrative   Not on file   Social Determinants of Health   Financial Resource Strain: Not on file  Food Insecurity: Not on file  Transportation Needs: Not on file  Physical Activity: Not on file  Stress: Not on file  Social Connections: Not on file  Intimate Partner Violence: Not on file    REVIEW OF SYSTEMS: Constitutional:  fatigue and weakness, not profound ENT:  No nose bleeds Pulm: Dyspnea at rest, worse with exertion.  Occasional clear sputum. CV:  No palpitations, no LE edema.  No angina GU:  No hematuria, no frequency.  Still makes urine GI: Good appetite, no anorexia.  No dysphagia.  No abdominal pain. Heme: Denies excessive or unusual bleeding or bruising. Transfusions: See HPI. Neuro:  No headaches, no peripheral tingling or numbness.  No dizziness, no syncope.  No seizures.  Denies limb weakness or gait disturbance. Derm:  No itching, no rash or sores.  Endocrine:  No sweats or chills.  No polyuria or dysuria Immunization: Not queried Travel: Recently relocated from [place] to [place].   PHYSICAL EXAM: Vital signs in last 24 hours: Vitals:   04/17/21 0615 04/17/21 0652  BP: (!) 180/90 (!) 193/86  Pulse: 71 67  Resp: (!) 25 19  Temp:  97.8 F (36.6 C)  SpO2: 97% 97%   Wt Readings from Last 3 Encounters:  03/20/20 62.3 kg  03/15/20 61.6 kg  03/13/20 64 kg    General: Elderly gentleman who does not look acutely ill.  Short of breath but sitting comfortably in bedside chair. Head: No facial asymmetry or swelling.  No signs of head trauma. Eyes: Conjunctiva pale.  Arcus senilis. Ears: No obvious hearing deficit Nose: Congestion or discharge Mouth: Tongue midline.  Mucosa pink, moist, clear. Neck: Thyromegaly, no JVD Lungs: Crackles throughout.  Wheezing.  No increased work of breathing. Heart: RRR.  No MRG.  S1, S2 present. Abdomen: Tender or distended.  Active bowel sounds.  No HSM, masses, bruits, hernias.   Rectal: Deferred.  Exam by ED physician revealed dark brown stool on glove which tested FOBT positive. Musc/Skeltl: No joint redness, swelling or gross deformity. Extremities: No CCE. Neurologic: Appropriate.  Oriented x3.  Moves all 4 limbs without gross weakness or tremors. Skin: Dry skin especially on the legs. Nodes: Cervical adenopathy Psych: Calm, pleasant, cooperative.  Intake/Output from previous day: No intake/output data recorded. Intake/Output this shift: No intake/output data recorded.  LAB RESULTS: Recent Labs    04/16/21 1046 04/17/21 0620  WBC 4.0 4.9  HGB 6.5* 8.5*  HCT 21.6* 27.9*  PLT 212 179   BMET Lab Results  Component Value Date   NA 141 04/17/2021   NA 143 04/16/2021   NA 137 03/02/2020   K 4.4 04/17/2021   K 3.8 04/16/2021   K 4.8 03/02/2020   CL 101 04/17/2021   CL 103 04/16/2021   CL 95 (L) 03/02/2020   CO2 25 04/17/2021   CO2 28 04/16/2021   CO2 25 03/02/2020   GLUCOSE 126 (H) 04/17/2021   GLUCOSE 124 (H) 04/16/2021   GLUCOSE 135 (H) 03/02/2020   BUN 56 (H) 04/17/2021   BUN 50 (H) 04/16/2021   BUN 65 (H) 03/02/2020   CREATININE 13.12 (H) 04/17/2021   CREATININE 12.20 (H) 04/16/2021   CREATININE 12.04 (H) 03/02/2020   CALCIUM 8.9 04/17/2021   CALCIUM 9.2 04/16/2021   CALCIUM 9.0 03/02/2020   LFT Recent Labs    04/17/21 0620  PROT 6.8  6.6  ALBUMIN 3.2*  3.3*  AST 21  22  ALT 22  22  ALKPHOS 55  56  BILITOT 0.5  0.5  BILIDIR <0.1  IBILI NOT CALCULATED   PT/INR No results found for: INR, PROTIME Hepatitis Panel No results for input(s): HEPBSAG, HCVAB, HEPAIGM, HEPBIGM in the last 72 hours. C-Diff No components found for: CDIFF Lipase  No results found for: LIPASE  Drugs of Abuse  No results found for: LABOPIA, COCAINSCRNUR, LABBENZ, AMPHETMU, THCU, LABBARB   RADIOLOGY STUDIES: DG Chest Port 1 View  Result  Date: 04/16/2021 CLINICAL DATA:  Shortness of breath. EXAM: PORTABLE CHEST 1 VIEW COMPARISON:  February 29, 2020. FINDINGS: The heart size and mediastinal contours are within normal limits. Interval placement of right internal jugular dialysis catheter with distal tip in expected position of the SVC. No pneumothorax or pleural effusion is noted. Right lung is clear. Mild left basilar subsegmental atelectasis or scarring is noted. The visualized skeletal structures are unremarkable. IMPRESSION: Mild left basilar subsegmental atelectasis or scarring. Aortic Atherosclerosis (ICD10-I70.0). Electronically Signed   By: Marijo Conception M.D.   On: 04/16/2021 19:27     IMPRESSION:   Acute on chronic anemia, recurrent.  FOBT positive but no overt GI bleeding.  Previous work-up including colonoscopy a couple of years ago and EGD, VCE, push enteroscopy in September 2021 revealed gastritis, duodenitis negative for H. pylori as well as bleeding and nonbleeding duodenal AVMs all treated with APC.  Compliant with daily Protonix.  Treated with Mircera and periodic parenteral iron by dialysis center in [place].  Good response to 2 PRBCs overnight.  ESRD.  TTS hemodialysis.  Last dialysis was 5 days ago and currently symptomatic with shortness of breath/volume overload.  Hemodialysis planned for today.     CAD.  Cardiac cath 6 weeks ago with suggestion for medical management.  Hx A. fib but not on anticoagulation.  Currently in sinus rhythm.    PLAN:        ?  SBE tomorrow?   Okay to eat today.   Azucena Freed  04/17/2021, 9:45 AM Phone [phone]   Attending physician's note   I have taken an interval history, reviewed the chart and examined the patient. I agree with the Advanced Practitioner's note, impression and recommendations.    80yrold with ESRD on HD, CHF, HTN, COPD, CVA, A Fib (not of AC d/t risks of falls and GI bleed) with recurrent symptomatic IDA with heme + stools.  Adm Hb 6.5 s/p 2U to  8.5  [place] neg colon 06/2019 (except for 2 small tubular adenomas), then push enteroscopy 05/2020 showing small bowel AVMs treated with APC. VCE prior showed few AVMs in px SB.  However capsule did not pass thru SB completely.  Plan: -Push enteroscopy in AM -If recurrent anemia requiring blood transfusions, rpt VCE as outpt.  Would like him to be more ambulatory. If +, single balloon enteroscopy at later date. -Trend CBC. -Continue Mircera/IV Fe -Avoid NSAIDs.   I have discussed the risks and benefits of enteroscopy. The risks including rare risk of perforation, bleeding, missed UGI neoplasms, risks of damage to internal organs, risks of anesthesia/sedation. Alternatives were given. Patient is aware and agrees to proceed. All the questions were answered. This will be scheduled in upcoming days. Consent forms were given for review.   RCarmell Austria MD LVelora HecklerGI [phone]

## 2021-04-17 NOTE — Progress Notes (Signed)
PHARMACIST - PHYSICIAN COMMUNICATION DR:   Aileen Fass CONCERNING: Antibiotic IV to Oral Route Change Policy  RECOMMENDATION: This patient is receiving doxycycline by the intravenous route.  Based on criteria approved by the Pharmacy and Therapeutics Committee, the antibiotic(s) is/are being converted to the equivalent oral dose form(s).   DESCRIPTION: These criteria include: Patient being treated for a respiratory tract infection, urinary tract infection, cellulitis or clostridium difficile associated diarrhea if on metronidazole The patient is not neutropenic and does not exhibit a GI malabsorption state The patient is eating (either orally or via tube) and/or has been taking other orally administered medications for a least 24 hours The patient is improving clinically and has a Tmax < 100.5  If you have questions about this conversion, please contact the Pharmacy Department  '[]'$   [phone] )  [facility] '[]'$   [phone] )  [facility] '[x]'$   [phone] )  [facility] '[]'$   [phone] )  [facility] '[]'$   [phone] )  [facility]

## 2021-04-17 NOTE — Progress Notes (Signed)
Echocardiogram 2D Echocardiogram has been performed.  Oneal Deputy Tanyiah Laurich RDCS 04/17/2021, 9:47 AM

## 2021-04-17 NOTE — Progress Notes (Signed)
Physical Therapy Evaluation Patient Details Name: Anthony [name] MRN: [id number] DOB: 1941/01/23 Today's Date: 04/17/2021   History of Present Illness  80 y.o. male presenting to ED 7/26 with SOB after missing dialysis. Patient recently moved to [place] from [place] and is trying to get set up with PCP and dialysis center. Patient also reporting rectal bleeding. Patient admitted with symptomatic anemia and COPD with acute exacerbation. PMHx significant for ESRD on HD T,Th, Sat prior to move, anemia, vascular dementia, ETOH use disorder, anemia, CHF, COPD, CVA, GIB, HTN, A-fib, HTN, tobacco use disorder, and Hx of positive PPD.  Clinical Impression  Pt presents with the impairments above and problems below. Pt performed mobility tasks with min guard for safety (no physical assist required). Noted SOB during mobility tasks; however, O2 sats at 97-100% on room air during session. Pt reported six stairs to enter home so will address stair training in next session. Anticipate pt will progress well and will not require follow-up.  Pt will benefit from skilled acute therapy to promote functional independence with mobility.     Follow Up Recommendations No PT follow up    Equipment Recommendations  None recommended by PT    Recommendations for Other Services       Precautions / Restrictions Precautions Precautions: Fall Precaution Comments: NPO pending GI consult Restrictions Weight Bearing Restrictions: No      Mobility  Bed Mobility               General bed mobility comments: Sitting up in chair upon arrival    Transfers Overall transfer level: Needs assistance Equipment used: None Transfers: Sit to/from Stand Sit to Stand: Min guard         General transfer comment: Min guard for safety only; no physical assist required  Ambulation/Gait Ambulation/Gait assistance: Min guard Gait Distance (Feet): 150 Feet Assistive device: None Gait Pattern/deviations:  Step-through pattern Gait velocity: mildly decreased   General Gait Details: O2 sats 97-100% on room air. Pt reports feeling close to baseline. Min guard for safety. No overt LOB when performing DGI tasks.  Stairs            Wheelchair Mobility    Modified Rankin (Stroke Patients Only)       Balance Overall balance assessment: Mild deficits observed, not formally tested                               Standardized Balance Assessment Standardized Balance Assessment : Dynamic Gait Index   Dynamic Gait Index Level Surface: Normal Gait with Horizontal Head Turns: Mild Impairment Gait with Vertical Head Turns: Mild Impairment Step Over Obstacle: Normal       Pertinent Vitals/Pain Pain Assessment: No/denies pain    Home Living Family/patient expects to be discharged to:: Private residence Living Arrangements: Spouse/significant other Available Help at Discharge: Family Type of Home: Mobile home Home Access: Stairs to enter Entrance Stairs-Rails: Left Entrance Stairs-Number of Steps: 6 Home Layout: One level Home Equipment: Cane - single point;Walker - 4 wheels;Grab bars - toilet;Grab bars - tub/shower      Prior Function Level of Independence: Independent         Comments: I w/ ADLs/IADLs without AD; family provides transportaiton     Hand Dominance        Extremity/Trunk Assessment   Upper Extremity Assessment Upper Extremity Assessment: Defer to OT evaluation    Lower Extremity Assessment Lower Extremity Assessment: Overall WFL for  tasks assessed    Cervical / Trunk Assessment Cervical / Trunk Assessment: Normal  Communication   Communication: HOH  Cognition Arousal/Alertness: Awake/alert Behavior During Therapy: WFL for tasks assessed/performed Overall Cognitive Status: History of cognitive impairments - at baseline                                 General Comments: Vascular dementia in hx in chart      General  Comments      Exercises     Assessment/Plan    PT Assessment Patient needs continued PT services  PT Problem List Decreased activity tolerance;Decreased balance;Decreased mobility;Cardiopulmonary status limiting activity       PT Treatment Interventions Gait training;Functional mobility training;Stair training;Balance training;Patient/family education;Therapeutic exercise;Therapeutic activities    PT Goals (Current goals can be found in the Care Plan section)  Acute Rehab PT Goals Patient Stated Goal: To return home. PT Goal Formulation: With patient Time For Goal Achievement: 05/01/21 Potential to Achieve Goals: Good    Frequency Min 3X/week   Barriers to discharge        Co-evaluation               AM-PAC PT "6 Clicks" Mobility  Outcome Measure Help needed turning from your back to your side while in a flat bed without using bedrails?: None Help needed moving from lying on your back to sitting on the side of a flat bed without using bedrails?: None Help needed moving to and from a bed to a chair (including a wheelchair)?: A Little Help needed standing up from a chair using your arms (e.g., wheelchair or bedside chair)?: A Little Help needed to walk in hospital room?: A Little Help needed climbing 3-5 steps with a railing? : A Little 6 Click Score: 20    End of Session Equipment Utilized During Treatment: Gait belt Activity Tolerance: Patient tolerated treatment well Patient left: in chair;with call bell/phone within reach;with chair alarm set Nurse Communication: Mobility status PT Visit Diagnosis: Difficulty in walking, not elsewhere classified (R26.2)    Time: [id number] PT Time Calculation (min) (ACUTE ONLY): 16 min   Charges:   PT Evaluation $PT Eval Low Complexity: 1 Low         Dawayne Cirri, SPT  Dawayne Cirri 04/17/2021, 12:18 PM

## 2021-04-17 NOTE — Evaluation (Signed)
Clinical/Bedside Swallow Evaluation Patient Details  Name: Anthony [name] MRN: [id number] Date of Birth: August 05, 1941  Today's Date: 04/17/2021 Time: SLP Start Time (ACUTE ONLY): 1200 SLP Stop Time (ACUTE ONLY): 1212 SLP Time Calculation (min) (ACUTE ONLY): 12 min  Past Medical History:  Past Medical History:  Diagnosis Date   Alcohol abuse    Arthritis    CHF (congestive heart failure) (HCC)    Chicken pox    Chronic kidney disease    ESRD (end stage renal disease) on dialysis ([place])    GI bleed    Hypertension    Insomnia    Stroke [facility])    Past Surgical History: No past surgical history on file. HPI:  Pt is a 80 y.o. male who presented with SOB and need for HD. Pt found to be COVID+. CXR 7/26: Mild left basilar subsegmental atelectasis or scarring. GI consulted and diet initiated on 7/27. PMH: ESRD, anemia vascular dementia Alcohol abuse, Anemia, Chronic heart failure with preserved ejection fraction,  COPD, CVA, esophagitis, GIB, HTN, p A.fib, pulmonary hypertension, tobacco abuse, positive PPD.   Assessment / Plan / Recommendation Clinical Impression  Pt was seen for bedside swallow evaluation and he denied a history of dysphagia. Oral mechanism exam was [facility] and dentition was reduced. He tolerated all solids and liquids without signs or symptoms of oropharyngeal dysphagia. However, Pt reported dyspnea during the session, but RR remained <20. A regular texture diet with thin liquids is recommended at this time. Considering dyspnea, SLP will follow briefly to ensure diet tolerance. SLP Visit Diagnosis: Dysphagia, unspecified (R13.10)    Aspiration Risk  Mild aspiration risk    Diet Recommendation Regular;Thin liquid   Liquid Administration via: Cup;Straw Medication Administration: Whole meds with liquid Compensations: Slow rate Postural Changes: Seated upright at 90 degrees    Other  Recommendations Oral Care Recommendations: Oral care BID   Follow up Recommendations None       Frequency and Duration min 1 x/week  1 week       Prognosis        Swallow Study   General Date of Onset: 04/16/21 HPI: Pt is a 80 y.o. male who presented with SOB and need for HD. Pt found to be COVID+. CXR 7/26: Mild left basilar subsegmental atelectasis or scarring. GI consulted and diet initiated on 7/27. PMH: ESRD, anemia vascular dementia Alcohol abuse, Anemia, Chronic heart failure with preserved ejection fraction,  COPD, CVA, esophagitis, GIB, HTN, p A.fib, pulmonary hypertension, tobacco abuse, positive PPD. Type of Study: Bedside Swallow Evaluation Previous Swallow Assessment: none stated Diet Prior to this Study: Regular;Thin liquids Temperature Spikes Noted: No Respiratory Status: Room air History of Recent Intubation: No Behavior/Cognition: Alert;Cooperative;Pleasant mood Oral Cavity Assessment: Within Functional Limits Oral Care Completed by SLP: No Oral Cavity - Dentition: Missing dentition Vision: Functional for self-feeding Self-Feeding Abilities: Able to feed self Patient Positioning: Upright in chair;Postural control adequate for testing Baseline Vocal Quality: Hoarse Volitional Cough: Strong Volitional Swallow: Able to elicit    Oral/Motor/Sensory Function Overall Oral Motor/Sensory Function: Within functional limits   Ice Chips Ice chips: Within functional limits Presentation: Spoon   Thin Liquid Thin Liquid: Within functional limits Presentation: Straw    Nectar Thick Nectar Thick Liquid: Not tested   Honey Thick Honey Thick Liquid: Not tested   Puree Puree: Within functional limits Presentation: Spoon   Solid     Solid: Within functional limits Presentation: [place] [address]in Negus, [place], [place]  Office number [phone] Pager [phone]  Horton Marshall 04/17/2021,12:27 PM

## 2021-04-17 NOTE — Plan of Care (Signed)

## 2021-04-17 NOTE — Progress Notes (Signed)
SLP Cancellation Note  Patient Details Name: Anthony [name] MRN: [id number] DOB: 08-10-1941   Cancelled treatment:       Reason Eval/Treat Not Completed: Medical issues which prohibited therapy (Per Dr. Rueben Bash note from 7/26, GI has been consulted and pt was made NPO at midnight pending their assessment with the plan of them seeing the pt this morning. Swallow evaluation will therefore be deferred.)  Anthony [name], [place], [place] Office number [phone] Pager [place] 04/17/2021, 9:06 AM

## 2021-04-17 NOTE — Consult Note (Addendum)
[facility] Consult: 9:45 AM 04/17/2021  LOS: 1 day    Referring Provider: Dr Aileen Fass  Primary Care Physician:  Isaac Bliss, Rayford Halsted, MD Primary Gastroenterologist:  Dr. Lorre Munroe in [place].  Unassigned pt    Reason for Consultation:  anemia.  FOBT +   HPI: Anthony [name] is a 80 y.o. male.  Relocated from [place] just a few days ago.  End-stage renal disease on HD TTS for ~ 5 years.  Htn.  CHF.  Renal osteodystrophy.  Vascular dementia.  Breakthrough COVID-19 02/2021 (vaccinated and boosted) was mild and did not require admission or medication..  COPD.  CVA.  A. fib, rate controlled..  Not on anticoagulation due to fall risk and hx of bleeding.  Cervical spondylosis.  Anemia and GI bleeding.  Admission for GI bleed, Hgb 5.9, 3 PRBCs transfused in 05/2020.Marland Kitchen  Being treated with Mircera, last dose was 7/19.  Additionally treated w periodic parenteral iron at HD.   - Colonoscopy 06/2019: diverticulosis, 2 polyps (tubular adenomas, no H GD., stool In entire colon,external and none bleeding mixed hemorrhoids - EGD 05/2020: Z-line irregular, mucosal nodule in esophagus, diffuse erosive gastropathy, erythemous duodenopathy.  Pathology showed mild, chronic inactive gastritis, no H. pylori.  No intestinal metaplasia. - Capsule endoscopy 05/2020: few AVMs in proximal small bowel, did not pass thru SB completely before end of recording - Push enteroscopy 05/2020: z-line irregular, small hiatal hernia, erythematous mucosa in antrum, erythematous duodenopathy, single bleeding angiodysplastic lesion in duodenum treated w/ APC, two non-bleeding angiodysplastic lesions treated w/ APC   6/11 - 03/05/21  admit in [place], atypical chest pain.  Abnormal dobutamine stress test 12/2020.  Cardiac cath 6/13 showed  ostial LAD lesion without significant flow limitation.  50% PDA lesion 60% RCA stenosis and poststenotic aneurysm.  Cardiology recommended medical management started him on Ranexa, Imdur in addition to other previous cardiac/hypertensive meds.  Hgb 7.5. GI curbsided and recommended PPI BID and outpatient follow-up.  Pt continues to take Protonix 40 mg/daily.  No NSAIDs or ASA.  Presented to the ED yesterday morning because he needed dialysis and had failed to make arrangements for transfer of dialysis care to [facility].  Last dialysis was 7/23.  C/O dyspnea. Hgb 6.5 >> 2 PRBCs >> 8.5. Hgb 7.3 on 7/22.   MCV 101.  Iron, ferritin, iron sats, folate, B12 normal.  TIBC low 231. Portable CXR with mild left basilar atelectasis versus scarring, aortic atherosclerosis.  Dialysis access catheter at right SVC.  GI ROS unremarkable.  Has brown to dark brown stools daily.  No BPR or melena.  Great appetite.  No nausea or vomiting.  Thinks he is lost about 4 pounds over the last several weeks.  No dysphagia or choking when he eats.  Previous smoker.  80-pack-year history.  Previous EtOH abuse.  Lives with his wife.    Past Medical History:  Diagnosis Date   Alcohol abuse    Arthritis    CHF (congestive heart failure) (HCC)    Chicken pox    Chronic kidney disease  ESRD (end stage renal disease) on dialysis [facility])    GI bleed    Hypertension    Insomnia    Stroke ([facility])     No past surgical history on file.  Prior to Admission medications   Medication Sig Start Date End Date Taking? Authorizing Provider  acetaminophen (TYLENOL) 500 MG tablet Take 1,000 mg by mouth every 8 (eight) hours as needed for moderate pain. 03/17/21  Yes [provider]  albuterol (PROVENTIL HFA) 108 (90 Base) MCG/ACT inhaler Inhale 2 puffs into the lungs every 4 (four) hours as needed for wheezing or shortness of breath. 03/08/20  Yes Laurey Morale, MD  amLODipine (NORVASC) 5 MG tablet Take 5 mg by mouth daily.  04/13/21  Yes [provider]  ASPIRIN LOW DOSE 81 MG chewable tablet Chew 81 mg by mouth daily. 03/05/21  Yes [provider]  atorvastatin (LIPITOR) 20 MG tablet TAKE 1 TABLET BY MOUTH EVERYDAY AT BEDTIME Patient taking differently: Take 20 mg by mouth daily. 07/30/20  Yes Isaac Bliss, Rayford Halsted, MD  budesonide-formoterol [facility]) 160-4.5 MCG/ACT inhaler Inhale 2 puffs into the lungs 2 (two) times daily. 04/12/21  Yes [provider]  calcium acetate (PHOSLO) 667 MG capsule Take 2,001 mg by mouth 3 (three) times daily with meals.   Yes [provider]  carboxymethylcellulose (REFRESH PLUS) 0.5 % SOLN Place 1 drop into both eyes daily as needed (dry eyes). 12/07/20  Yes [provider]  carvedilol (COREG) 6.25 MG tablet Take 1 tablet (6.25 mg total) by mouth 2 (two) times daily with a meal. 03/02/20  Yes Black, Lezlie Octave, NP  hydrALAZINE (APRESOLINE) 100 MG tablet Take 100 mg by mouth 2 (two) times daily. 07/31/19  Yes [provider]  isosorbide mononitrate (IMDUR) 30 MG 24 hr tablet Take 30 mg by mouth daily. 04/02/21  Yes [provider]  ketotifen (ZADITOR) 0.025 % ophthalmic solution Place 1 drop into both eyes 2 (two) times daily. 12/07/20  Yes [provider]  memantine (NAMENDA) 5 MG tablet Take 5 mg by mouth daily. 03/12/21  Yes [provider]  methocarbamol (ROBAXIN) 500 MG tablet Take 1 tablet (500 mg total) by mouth every 8 (eight) hours as needed for muscle spasms. 12/21/19  Yes Lucrezia Starch, MD  pantoprazole (PROTONIX) 40 MG tablet Take 40 mg by mouth daily before breakfast.   Yes [provider]  predniSONE (DELTASONE) 20 MG tablet Take 20 mg by mouth 2 (two) times daily with a meal. 04/12/21  Yes [provider]  Tiotropium Bromide Monohydrate (SPIRIVA RESPIMAT) 2.5 MCG/ACT AERS Inhale 2 puffs into the lungs daily. 03/15/20  Yes Isaac Bliss, Rayford Halsted, MD  ADVAIR [facility] [phone] MCG/ACT  inhaler INHALE 2 PUFFS INTO THE LUNGS IN THE MORNING AND AT BEDTIME Patient not taking: No sig reported 01/17/21   Isaac Bliss, Rayford Halsted, MD  nicotine (NICODERM CQ - DOSED IN MG/24 HOURS) 14 mg/24hr patch Place 1 patch (14 mg total) onto the skin daily. Patient not taking: No sig reported 03/15/20   Isaac Bliss, Rayford Halsted, MD  traZODone (DESYREL) 50 MG tablet TAKE 1 TO 2 TABLETS AT BEDTIME AS NEEDED Patient not taking: No sig reported 03/28/20   Isaac Bliss, Rayford Halsted, MD    Scheduled Meds:  sodium chloride   Intravenous Once   amLODipine  5 mg Oral Daily   atorvastatin  20 mg Oral Daily   calcium acetate  2,001 mg Oral TID WC   Chlorhexidine  Gluconate Cloth  6 each Topical Q0600   ipratropium-albuterol  3 mL Nebulization Q6H   ketotifen  1 drop Both Eyes BID   memantine  5 mg Oral Daily   methylPREDNISolone (SOLU-MEDROL) injection  40 mg Intravenous Q12H   Followed by   Derrill Memo ON 04/18/2021] predniSONE  40 mg Oral Q breakfast   mometasone-formoterol  2 puff Inhalation BID   pantoprazole (PROTONIX) IV  40 mg Intravenous Q12H   sodium chloride flush  3 mL Intravenous Q12H   sodium chloride flush  3 mL Intravenous Q12H   Infusions:  sodium chloride     sodium chloride     doxycycline (VIBRAMYCIN) IV Stopped (04/17/21 0018)   ferumoxytol     PRN Meds: sodium chloride, acetaminophen **OR** acetaminophen, albuterol, albuterol, HYDROcodone-acetaminophen, sodium chloride flush   Allergies as of 04/16/2021   (No Known Allergies)    Family History  Problem Relation Age of Onset   Hypertension Mother    Hypertension Father     Social History   Socioeconomic History   Marital status: Married    Spouse name: Not on file   Number of children: Not on file   Years of education: Not on file   Highest education level: Not on file  Occupational History   Not on file  Tobacco Use   Smoking status: Every Day   Smokeless tobacco: Never  Substance and Sexual Activity    Alcohol use: Not Currently   Drug use: Not Currently   Sexual activity: Not on file  Other Topics Concern   Not on file  Social History Narrative   Not on file   Social Determinants of Health   Financial Resource Strain: Not on file  Food Insecurity: Not on file  Transportation Needs: Not on file  Physical Activity: Not on file  Stress: Not on file  Social Connections: Not on file  Intimate Partner Violence: Not on file    REVIEW OF SYSTEMS: Constitutional:  fatigue and weakness, not profound ENT:  No nose bleeds Pulm: Dyspnea at rest, worse with exertion.  Occasional clear sputum. CV:  No palpitations, no LE edema.  No angina GU:  No hematuria, no frequency.  Still makes urine GI: Good appetite, no anorexia.  No dysphagia.  No abdominal pain. Heme: Denies excessive or unusual bleeding or bruising. Transfusions: See HPI. Neuro:  No headaches, no peripheral tingling or numbness.  No dizziness, no syncope.  No seizures.  Denies limb weakness or gait disturbance. Derm:  No itching, no rash or sores.  Endocrine:  No sweats or chills.  No polyuria or dysuria Immunization: Not queried Travel: Recently relocated from [place] to [place].   PHYSICAL EXAM: Vital signs in last 24 hours: Vitals:   04/17/21 0615 04/17/21 0652  BP: (!) 180/90 (!) 193/86  Pulse: 71 67  Resp: (!) 25 19  Temp:  97.8 F (36.6 C)  SpO2: 97% 97%   Wt Readings from Last 3 Encounters:  03/20/20 62.3 kg  03/15/20 61.6 kg  03/13/20 64 kg    General: Elderly gentleman who does not look acutely ill.  Short of breath but sitting comfortably in bedside chair. Head: No facial asymmetry or swelling.  No signs of head trauma. Eyes: Conjunctiva pale.  Arcus senilis. Ears: No obvious hearing deficit Nose: Congestion or discharge Mouth: Tongue midline.  Mucosa pink, moist, clear. Neck: Thyromegaly, no JVD Lungs: Crackles throughout.  Wheezing.  No increased work of breathing. Heart: RRR.  No MRG.  S1, S2 present. Abdomen: Tender or distended.  Active bowel sounds.  No HSM, masses, bruits, hernias.   Rectal: Deferred.  Exam by ED physician revealed dark brown stool on glove which tested FOBT positive. Musc/Skeltl: No joint redness, swelling or gross deformity. Extremities: No CCE. Neurologic: Appropriate.  Oriented x3.  Moves all 4 limbs without gross weakness or tremors. Skin: Dry skin especially on the legs. Nodes: Cervical adenopathy Psych: Calm, pleasant, cooperative.  Intake/Output from previous day: No intake/output data recorded. Intake/Output this shift: No intake/output data recorded.  LAB RESULTS: Recent Labs    04/16/21 1046 04/17/21 0620  WBC 4.0 4.9  HGB 6.5* 8.5*  HCT 21.6* 27.9*  PLT 212 179   BMET Lab Results  Component Value Date   NA 141 04/17/2021   NA 143 04/16/2021   NA 137 03/02/2020   K 4.4 04/17/2021   K 3.8 04/16/2021   K 4.8 03/02/2020   CL 101 04/17/2021   CL 103 04/16/2021   CL 95 (L) 03/02/2020   CO2 25 04/17/2021   CO2 28 04/16/2021   CO2 25 03/02/2020   GLUCOSE 126 (H) 04/17/2021   GLUCOSE 124 (H) 04/16/2021   GLUCOSE 135 (H) 03/02/2020   BUN 56 (H) 04/17/2021   BUN 50 (H) 04/16/2021   BUN 65 (H) 03/02/2020   CREATININE 13.12 (H) 04/17/2021   CREATININE 12.20 (H) 04/16/2021   CREATININE 12.04 (H) 03/02/2020   CALCIUM 8.9 04/17/2021   CALCIUM 9.2 04/16/2021   CALCIUM 9.0 03/02/2020   LFT Recent Labs    04/17/21 0620  PROT 6.8  6.6  ALBUMIN 3.2*  3.3*  AST 21  22  ALT 22  22  ALKPHOS 55  56  BILITOT 0.5  0.5  BILIDIR <0.1  IBILI NOT CALCULATED   PT/INR No results found for: INR, PROTIME Hepatitis Panel No results for input(s): HEPBSAG, HCVAB, HEPAIGM, HEPBIGM in the last 72 hours. C-Diff No components found for: CDIFF Lipase  No results found for: LIPASE  Drugs of Abuse  No results found for: LABOPIA, COCAINSCRNUR, LABBENZ, AMPHETMU, THCU, LABBARB   RADIOLOGY STUDIES: DG Chest Port 1 View  Result  Date: 04/16/2021 CLINICAL DATA:  Shortness of breath. EXAM: PORTABLE CHEST 1 VIEW COMPARISON:  February 29, 2020. FINDINGS: The heart size and mediastinal contours are within normal limits. Interval placement of right internal jugular dialysis catheter with distal tip in expected position of the SVC. No pneumothorax or pleural effusion is noted. Right lung is clear. Mild left basilar subsegmental atelectasis or scarring is noted. The visualized skeletal structures are unremarkable. IMPRESSION: Mild left basilar subsegmental atelectasis or scarring. Aortic Atherosclerosis (ICD10-I70.0). Electronically Signed   By: Marijo Conception M.D.   On: 04/16/2021 19:27     IMPRESSION:   Acute on chronic anemia, recurrent.  FOBT positive but no overt GI bleeding.  Previous work-up including colonoscopy a couple of years ago and EGD, VCE, push enteroscopy in September 2021 revealed gastritis, duodenitis negative for H. pylori as well as bleeding and nonbleeding duodenal AVMs all treated with APC.  Compliant with daily Protonix.  Treated with Mircera and periodic parenteral iron by dialysis center in [place].  Good response to 2 PRBCs overnight.  ESRD.  TTS hemodialysis.  Last dialysis was 5 days ago and currently symptomatic with shortness of breath/volume overload.  Hemodialysis planned for today.     CAD.  Cardiac cath 6 weeks ago with suggestion for medical management.  Hx A. fib but not on anticoagulation.  Currently in sinus rhythm.    PLAN:        ?  SBE tomorrow?   Okay to eat today.   Azucena Freed  04/17/2021, 9:45 AM Phone [phone]   Attending physician's note   I have taken an interval history, reviewed the chart and examined the patient. I agree with the Advanced Practitioner's note, impression and recommendations.    80yrold with ESRD on HD, CHF, HTN, COPD, CVA, A Fib (not of AC d/t risks of falls and GI bleed) with recurrent symptomatic IDA with heme + stools.  Adm Hb 6.5 s/p 2U to  8.5  [place], neg colon 06/2019 (except for 2 small tubular adenomas), then push enteroscopy 05/2020 showing small bowel AVMs treated with APC. VCE prior showed few AVMs in px SB.  However capsule did not pass thru SB completely.  Plan: -Push enteroscopy in AM -If recurrent anemia requiring blood transfusions, rpt VCE as outpt.  Would like him to be more ambulatory. If +, single balloon enteroscopy at later date. -Trend CBC. -Continue Mircera/IV Fe -Avoid NSAIDs.   I have discussed the risks and benefits of enteroscopy. The risks including rare risk of perforation, bleeding, missed UGI neoplasms, risks of damage to internal organs, risks of anesthesia/sedation. Alternatives were given. Patient is aware and agrees to proceed. All the questions were answered. This will be scheduled in upcoming days. Consent forms were given for review.   RCarmell Austria MD LVelora HecklerGI [phone]

## 2021-04-17 NOTE — Evaluation (Signed)
Occupational Therapy Evaluation Patient Details Name: Anthony [name] MRN: [id number] DOB: July 19, 1941 Today's Date: 04/17/2021    History of Present Illness 80 y.o. male presenting to ED 7/26 with SOB after missing dialysis. Patient recently moved to [place] from [place] and is trying to get set up with PCP and dialysis center. Last full tx was on 7/23. Patient also reporting rectal bleeding. Patient admitted with symptomatic anemia and COPD with acute exacerbation. PMHx significant for ESRD on HD T,Th, Sat prior to move, anemia, vascular dementia, ETOH use disorder, anemia, CHF, COPD, CVA, GIB, HTN, A-fib, HTN, tobacco use disorder, and Hx of positive PPD.   Clinical Impression   PTA patient was living with his spouse in a mobile home with 6 STE and was grossly I with ADLs/IADLs without AD. Family provides transportation to and from dialysis. Patient with current deficits decreased cardiopulmonary endurance with 3/4 DOE with light activity, decreased activity tolerance and mild balance deficits requiring supervision A grossly for observed ADLs and household mobility without AD. Patient would benefit from continued acute OT services in prep for safe d/c home. Patient likely to progress well.      Follow Up Recommendations  No OT follow up    Equipment Recommendations  None recommended by OT    Recommendations for Other Services       Precautions / Restrictions Precautions Precautions: Fall Precaution Comments: NPO pending GI consult Restrictions Weight Bearing Restrictions: No      Mobility Bed Mobility Overal bed mobility: Independent                  Transfers Overall transfer level: Independent                    Balance Overall balance assessment: Mild deficits observed, not formally tested                                         ADL either performed or assessed with clinical judgement   ADL Overall ADL's : Needs  assistance/impaired     Grooming: Supervision/safety;Standing Grooming Details (indicate cue type and reason): Supervision A for line management only.             Lower Body Dressing: Supervision/safety Lower Body Dressing Details (indicate cue type and reason): Supervision A for safety/line management. Toilet Transfer: Copy Details (indicate cue type and reason): Supervision A for safety/line management                 Vision Baseline Vision/History: Wears glasses Wears Glasses: At all times Patient Visual Report: No change from baseline Additional Comments: No glasses present in room. Able to correctly identify time on wall clock.     Perception     Praxis      Pertinent Vitals/Pain Pain Assessment: No/denies pain     Hand Dominance     Extremity/Trunk Assessment Upper Extremity Assessment Upper Extremity Assessment: Overall WFL for tasks assessed   Lower Extremity Assessment Lower Extremity Assessment: Defer to PT evaluation   Cervical / Trunk Assessment Cervical / Trunk Assessment: Normal   Communication Communication Communication: HOH   Cognition Arousal/Alertness: Awake/alert Behavior During Therapy: WFL for tasks assessed/performed Overall Cognitive Status: Within Functional Limits for tasks assessed  General Comments  VSS on RA. BP 171/101 at conclusion of treatment session. RN made aware.    Exercises     Shoulder Instructions      Home Living Family/patient expects to be discharged to:: Private residence Living Arrangements: Spouse/significant other Available Help at Discharge: Family Type of Home: Mobile home Home Access: Stairs to enter Entrance Stairs-Number of Steps: 6 Entrance Stairs-Rails: Left (Ascending) Home Layout: One level     Bathroom Shower/Tub: Walk-in Hydrologist: Standard     Home Equipment: Cane - single  point;Walker - 4 wheels          Prior Functioning/Environment Level of Independence: Independent        Comments: I w/ ADLs/IADLs without AD; family provides transportaiton        OT Problem List: Decreased activity tolerance;Cardiopulmonary status limiting activity      OT Treatment/Interventions: Self-care/ADL training;Therapeutic exercise;Energy conservation;DME and/or AE instruction;Therapeutic activities;Patient/family education;Balance training    OT Goals(Current goals can be found in the care plan section) Acute Rehab OT Goals Patient Stated Goal: To return home. OT Goal Formulation: With patient Time For Goal Achievement: 05/01/21 Potential to Achieve Goals: Good ADL Goals Additional ADL Goal #1: Patient will complete ADLs with I in prep for safe d/c home. Additional ADL Goal #2: Patient will recall 3 energy conservation techniques in prep for ADLs/IADLs. Additional ADL Goal #3: Patient will tolerate 15 minutes of therapeutic activity without need for rest break indicating improved activity tolerance.  OT Frequency: Min 2X/week   Barriers to D/C:            Co-evaluation              AM-PAC OT "6 Clicks" Daily Activity     Outcome Measure Help from another person eating meals?: None Help from another person taking care of personal grooming?: A Little Help from another person toileting, which includes using toliet, bedpan, or urinal?: A Little Help from another person bathing (including washing, rinsing, drying)?: A Little Help from another person to put on and taking off regular upper body clothing?: None Help from another person to put on and taking off regular lower body clothing?: A Little 6 Click Score: 20   End of Session Equipment Utilized During Treatment: Gait belt Nurse Communication: Mobility status;Other (comment) (Need for "restricted extremity" armband on L arm 2/2 dialysis fistula)  Activity Tolerance: Patient tolerated treatment  well;Patient limited by fatigue Patient left: in chair;with call bell/phone within reach;with chair alarm set  OT Visit Diagnosis: Muscle weakness (generalized) (M62.81)                Time: [id number] OT Time Calculation (min): 17 min Charges:  OT General Charges $OT Visit: 1 Visit OT Evaluation $OT Eval Low Complexity: 1 Low  Eleni Frank H. OTR/L Supplemental OT, Department of rehab services [phone]  Autum Benfer R H. 04/17/2021, 9:13 AM

## 2021-04-17 NOTE — ED Notes (Signed)
Attempted to call report. Secretary states RN will call me back shortly.

## 2021-04-18 ENCOUNTER — Encounter (HOSPITAL_COMMUNITY): Payer: Self-pay | Admitting: Gastroenterology

## 2021-04-18 ENCOUNTER — Inpatient Hospital Stay (HOSPITAL_COMMUNITY): Payer: Medicare Other | Admitting: Certified Registered"

## 2021-04-18 ENCOUNTER — Encounter (HOSPITAL_COMMUNITY)
Admission: EM | Disposition: A | Discharge status: Home Health | Payer: Self-pay | Source: Home / Self Care | Attending: Internal Medicine

## 2021-04-18 ENCOUNTER — Inpatient Hospital Stay (HOSPITAL_COMMUNITY): Payer: Medicare Other

## 2021-04-18 DIAGNOSIS — E440 Moderate protein-calorie malnutrition: Secondary | ICD-10-CM | POA: Insufficient documentation

## 2021-04-18 DIAGNOSIS — K31819 Angiodysplasia of stomach and duodenum without bleeding: Secondary | ICD-10-CM | POA: Diagnosis not present

## 2021-04-18 DIAGNOSIS — N186 End stage renal disease: Secondary | ICD-10-CM

## 2021-04-18 DIAGNOSIS — N185 Chronic kidney disease, stage 5: Secondary | ICD-10-CM

## 2021-04-18 DIAGNOSIS — I5032 Chronic diastolic (congestive) heart failure: Secondary | ICD-10-CM | POA: Diagnosis not present

## 2021-04-18 DIAGNOSIS — K922 Gastrointestinal hemorrhage, unspecified: Secondary | ICD-10-CM

## 2021-04-18 DIAGNOSIS — F0390 Unspecified dementia without behavioral disturbance: Secondary | ICD-10-CM | POA: Diagnosis not present

## 2021-04-18 DIAGNOSIS — J441 Chronic obstructive pulmonary disease with (acute) exacerbation: Secondary | ICD-10-CM | POA: Diagnosis not present

## 2021-04-18 DIAGNOSIS — D649 Anemia, unspecified: Secondary | ICD-10-CM | POA: Diagnosis not present

## 2021-04-18 HISTORY — PX: ENTEROSCOPY: SHX5533

## 2021-04-18 HISTORY — PX: HOT HEMOSTASIS: SHX5433

## 2021-04-18 HISTORY — PX: SUBMUCOSAL TATTOO INJECTION: SHX6856

## 2021-04-18 LAB — TYPE AND SCREEN
ABO/RH(D): O POS
Antibody Screen: NEGATIVE
Unit division: 0
Unit division: 0

## 2021-04-18 LAB — BPAM RBC
Blood Product Expiration Date: 202208282359
Blood Product Expiration Date: 202208282359
ISSUE DATE / TIME: 202207262145
ISSUE DATE / TIME: 202207270034
Unit Type and Rh: 5100
Unit Type and Rh: 5100

## 2021-04-18 LAB — HEPATITIS B SURFACE ANTIBODY, QUANTITATIVE: Hep B S AB Quant (Post): <3.1 m[IU]/mL — ABNORMAL LOW (ref >9.9)

## 2021-04-18 SURGERY — ENTEROSCOPY
Anesthesia: Monitor Anesthesia Care

## 2021-04-18 MED ORDER — PREDNISONE 20 MG PO TABS
20.0000 mg | ORAL_TABLET | Freq: Every day | ORAL | Status: DC
  Administered 2021-04-19: 20 mg via ORAL
  Filled 2021-04-18: qty 1

## 2021-04-18 MED ORDER — AMLODIPINE BESYLATE 10 MG PO TABS
10.0000 mg | ORAL_TABLET | Freq: Every day | ORAL | Status: DC
  Administered 2021-04-19 – 2021-04-24 (×6): 10 mg via ORAL
  Filled 2021-04-18 (×6): qty 1

## 2021-04-18 MED ORDER — LIDOCAINE 2% (20 MG/ML) 5 ML SYRINGE
INTRAMUSCULAR | Status: DC | PRN
  Administered 2021-04-18: 60 mg via INTRAVENOUS

## 2021-04-18 MED ORDER — GLUCAGON HCL RDNA (DIAGNOSTIC) 1 MG IJ SOLR
INTRAMUSCULAR | Status: DC | PRN
  Administered 2021-04-18 (×3): .2 mg via INTRAVENOUS

## 2021-04-18 MED ORDER — PROPOFOL 10 MG/ML IV BOLUS
INTRAVENOUS | Status: DC | PRN
  Administered 2021-04-18: 40 mg via INTRAVENOUS

## 2021-04-18 MED ORDER — PROPOFOL 500 MG/50ML IV EMUL
INTRAVENOUS | Status: DC | PRN
  Administered 2021-04-18: 100 ug/kg/min via INTRAVENOUS

## 2021-04-18 MED ORDER — HYDRALAZINE HCL 25 MG PO TABS: 25.0000 mg | ORAL_TABLET | Freq: Four times a day (QID) | ORAL | Status: DC | PRN

## 2021-04-18 MED ORDER — LACTATED RINGERS IV SOLN: INTRAVENOUS | Status: DC | PRN

## 2021-04-18 MED ORDER — SPOT INK MARKER SYRINGE KIT
PACK | SUBMUCOSAL | Status: DC | PRN
  Administered 2021-04-18: 2 mL via SUBMUCOSAL

## 2021-04-18 MED ORDER — SODIUM CHLORIDE 0.9 % IV SOLN: INTRAVENOUS | Status: DC | PRN

## 2021-04-18 NOTE — Progress Notes (Signed)
RUE vein mapping has been completed.  Results can be found under chart review under CV PROC. 04/18/2021 4:42 PM Paticia Moster RVT, RDMS

## 2021-04-18 NOTE — Transfer of Care (Signed)
Immediate Anesthesia Transfer of Care Note  Patient: Anthony [name]  Procedure(s) Performed: ENTEROSCOPY HOT HEMOSTASIS (ARGON PLASMA COAGULATION/BICAP) SUBMUCOSAL TATTOO INJECTION  Patient Location: Endoscopy Unit  Anesthesia Type:MAC  Level of Consciousness: drowsy  Airway & Oxygen Therapy: Patient Spontanous Breathing  Post-op Assessment: Report given to RN and Post -op Vital signs reviewed and stable  Post vital signs: Reviewed and stable  Last Vitals:  Vitals Value Taken Time  BP 129/58 04/18/21 1200  Temp 36.4 C 04/18/21 1200  Pulse 51 04/18/21 1204  Resp 15 04/18/21 1204  SpO2 100 % 04/18/21 1204  Vitals shown include unvalidated device data.  Last Pain:  Vitals:   04/18/21 1200  TempSrc: Oral  PainSc: 0-No pain      Patients Stated Pain Goal: 0 (08/12/61 4469)  Complications: No notable events documented.

## 2021-04-18 NOTE — Op Note (Signed)
[facility] Patient Name: Anthony [name] Procedure Date : 04/18/2021 MRN: [id number] Attending MD: Jackquline Denmark , MD Date of Birth: Sep 29, 1940 CSN: [id number] Age: 80 Admit Type: Inpatient Procedure:                Small bowel enteroscopy Indications:              80yrold with ESRD on HD, CHF, HTN, COPD, CVA, A                            Fib (not of AC d/t risks of falls and GI bleed)                            with recurrent symptomatic IDA with heme + stools.                           Adm Hb 6.5 s/p 2U to 8.5                           [place], neg colon 06/2019 (except for 2 small                            tubular adenomas), then push enteroscopy 05/2020                            showing small bowel AVMs treated with APC. VCE                            prior showed few AVMs in px SB. However capsule did                            not pass thru SB completely. Providers:                RJackquline Denmark MD, DCarmie End RN, Clae HKerin Ransom                            CSherrilyn Rist SRNA, ERhae Lerner CRNA Referring MD:              Medicines:                Monitored Anesthesia Care Complications:            No immediate complications. Estimated Blood Loss:     Estimated blood loss: none. Procedure:                Pre-Anesthesia Assessment:                           - Prior to the procedure, a History and Physical                            was performed, and patient medications and  allergies were reviewed. The patient's tolerance of                            previous anesthesia was also reviewed. The risks                            and benefits of the procedure and the sedation                            options and risks were discussed with the patient.                            All questions were answered, and informed consent                             was obtained. Prior Anticoagulants: The patient has                            not been on any anticoagulants. ASA Grade                            Assessment: III - A patient with severe systemic                            disease. After reviewing the risks and benefits,                            the patient was deemed in satisfactory condition to                            undergo the procedure.                           After obtaining informed consent, the endoscope was                            passed under direct vision. Throughout the                            procedure, the patient's blood pressure, pulse, and                            oxygen saturations were monitored continuously. The                            [id number] Olympus colonoscope was                            introduced through the mouth and advanced to the                            mid-jejunum. The small bowel enteroscopy was  accomplished without difficulty. The patient                            tolerated the procedure well.                           Distal most reach was tattooed using 2 cc of Spot.                            Glucagon was given on withdrawal. Scope In: Scope Out: Findings:      The examined esophagus was normal.      A small hiatal hernia was present.      Two angiodysplastic lesions with no bleeding were found in the third       portion of the duodenum. Coagulation for hemostasis using argon beam at       0.4 liters/minute and 20 watts was successful.      Three angiodysplastic lesions with no bleeding were found in the       proximal jejunum. Coagulation for hemostasis using argon plasma at 0.4       liters/minute and 20 watts was successful. Impression:               - Small hiatal hernia.                           - Two non-bleeding angiodysplastic lesions in the                            duodenum. Treated with argon beam coagulation.                            - Three non-bleeding angiodysplastic lesions in the                            duodenum. Treated with argon plasma coagulation                            (APC).                           - No specimens collected. Recommendation:           - Return patient to hospital ward for ongoing care.                           - Resume previous diet.                           - Trend CBC.                           - If continued problems, VCE as outpatient followed                            by single balloon enteroscopy if needed.                           -  The findings and recommendations were discussed                            with the patient's family. Procedure Code(s):        --- Professional ---                           [phone], Small intestinal endoscopy, enteroscopy                            beyond second portion of duodenum, not including                            ileum; with control of bleeding (eg, injection,                            bipolar cautery, unipolar cautery, laser, heater                            probe, stapler, plasma coagulator) Diagnosis Code(s):        --- Professional ---                           K44.9, Diaphragmatic hernia without obstruction or                            gangrene                           K31.819, Angiodysplasia of stomach and duodenum                            without bleeding                           K92.2, Gastrointestinal hemorrhage, unspecified CPT copyright 2019 American Medical Association. All rights reserved. The codes documented in this report are preliminary and upon coder review may  be revised to meet current compliance requirements. Jackquline Denmark, MD 04/18/2021 12:07:50 PM This report has been signed electronically. Number of Addenda: 0

## 2021-04-18 NOTE — Anesthesia Procedure Notes (Signed)
Procedure Name: MAC Date/Time: 04/18/2021 11:13 AM Performed by: Kyung Rudd, CRNA Pre-anesthesia Checklist: Patient identified, Emergency Drugs available, Suction available and Patient being monitored Patient Re-evaluated:Patient Re-evaluated prior to induction Oxygen Delivery Method: Nasal cannula Preoxygenation: Pre-oxygenation with 100% oxygen Induction Type: IV induction Placement Confirmation: positive ETCO2 Dental Injury: Teeth and Oropharynx as per pre-operative assessment

## 2021-04-18 NOTE — Progress Notes (Signed)
  Speech Language Pathology Treatment: Dysphagia  Patient Details Name: Anthony [name] MRN: [id number] DOB: 02-Jan-1941 Today's Date: 04/18/2021 Time: 0475-3391 SLP Time Calculation (min) (ACUTE ONLY): 14 min  Assessment / Plan / Recommendation Clinical Impression  Pt was seen for dysphagia treatment and he was cooperative throughout the session. Pt, and Lydia, RN reported that the pt has been tolerating the current diet and meds whole with thin liquids without overt s/sx of aspiration. Pt reported that his dyspnea has improved today. Pt tolerated mechanical soft solids, dual consistency boluses (i.e., peaches in fruit juice), regular texture solids, and thin liquids via straw using individual and consecutive swallows without symptoms of oropharyngeal dysphagia. It is recommended that the current diet be continued. Further skilled SLP services are not clinically indicated at this time.     HPI HPI: Pt is a 80 y.o. male who presented with SOB and need for HD. Pt found to be COVID+. CXR 7/26: Mild left basilar subsegmental atelectasis or scarring. GI consulted and diet initiated on 7/27. PMH: ESRD, anemia vascular dementia Alcohol abuse, Anemia, Chronic heart failure with preserved ejection fraction,  COPD, CVA, esophagitis, GIB, HTN, p A.fib, pulmonary hypertension, tobacco abuse, positive PPD.      SLP Plan  All goals met;Discharge SLP treatment due to (comment)       Recommendations  Diet recommendations: Regular;Thin liquid Liquids provided via: Cup;Straw Medication Administration: Whole meds with liquid Supervision: Patient able to self feed Compensations: Slow rate Postural Changes and/or Swallow Maneuvers: Seated upright 90 degrees                Oral Care Recommendations: Oral care BID Follow up Recommendations: None SLP Visit Diagnosis: Dysphagia, unspecified (R13.10) Plan: All goals met;Discharge SLP treatment due to (comment)       Catilyn Boggus I. Hardin Negus, [place],  [place] Office number [phone] Pager [place] 04/18/2021, 5:13 PM

## 2021-04-18 NOTE — Consult Note (Addendum)
Consult Note     CC: dialysis access Requesting Provider:  Dr. Augustin Coupe  HPI: Anthony [name] is a 80 y.o. ([birth year]) male admitted to the hospital with GI bleed.  Past medical history also significant for end-stage renal disease on hemodialysis.  He is being seen in consultation for evaluation for permanent dialysis access placement.  He has history of a left upper arm loop graft that was placed at [facility] in 2018.  Patient states this worked for a couple years then thrombosed.  He is currently dialyzing via right IJ TDC.  He has never had any dialysis access placement in his right arm.  He is a current everyday smoker.  He does not have a pacemaker.  He has atrial fibrillation however is not anticoagulated due to fall risk and GI bleed.   Past Medical History:  Diagnosis Date   Alcohol abuse    Arthritis    CHF (congestive heart failure) (HCC)    Chicken pox    Chronic kidney disease    ESRD (end stage renal disease) on dialysis ([place])    GI bleed    Hypertension    Insomnia    Stroke ([place])     No past surgical history on file.  Social History   Socioeconomic History   Marital status: Married    Spouse name: Not on file   Number of children: Not on file   Years of education: Not on file   Highest education level: Not on file  Occupational History   Not on file  Tobacco Use   Smoking status: Every Day   Smokeless tobacco: Never  Substance and Sexual Activity   Alcohol use: Not Currently   Drug use: Not Currently   Sexual activity: Not on file  Other Topics Concern   Not on file  Social History Narrative   Not on file   Social Determinants of Health   Financial Resource Strain: Not on file  Food Insecurity: Not on file  Transportation Needs: Not on file  Physical Activity: Not on file  Stress: Not on file  Social Connections: Not on file  Intimate Partner Violence: Not on file    Family History  Problem Relation Age of Onset   Hypertension Mother     Hypertension Father     Current Facility-Administered Medications  Medication Dose Route Frequency Provider Last Rate Last Admin   0.9 %  sodium chloride infusion (Manually program via Guardrails IV Fluids)   Intravenous Once Doutova, Anastassia, MD       0.9 %  sodium chloride infusion  10 mL/hr Intravenous Once Venter, Margaux, PA-C       acetaminophen (TYLENOL) tablet 650 mg  650 mg Oral Q6H PRN Toy Baker, MD       Or   acetaminophen (TYLENOL) suppository 650 mg  650 mg Rectal Q6H PRN Doutova, Anastassia, MD       albuterol (PROVENTIL) (2.5 MG/3ML) 0.083% nebulizer solution 2.5 mg  2.5 mg Nebulization Q2H PRN Doutova, Anastassia, MD       albuterol (PROVENTIL) (2.5 MG/3ML) 0.083% nebulizer solution 2.5 mg  2.5 mg Nebulization Q2H PRN Toy Baker, MD       [START ON 04/19/2021] amLODipine (NORVASC) tablet 10 mg  10 mg Oral Daily Charlynne Cousins, MD       atorvastatin (LIPITOR) tablet 20 mg  20 mg Oral Daily Doutova, Anastassia, MD   20 mg at 04/18/21 1235   calcium acetate (PHOSLO) capsule 2,001 mg  2,001 mg Oral TID WC Toy Baker, MD   2,001 mg at 04/18/21 1235   carvedilol (COREG) tablet 6.25 mg  6.25 mg Oral BID WC Charlynne Cousins, MD   6.25 mg at 04/18/21 0841   Chlorhexidine Gluconate Cloth 2 % PADS 6 each  6 each Topical Q0600 Dwana Melena, MD   6 each at 04/18/21 [id number]   doxycycline (VIBRA-TABS) tablet 100 mg  100 mg Oral Q12H Pham, Minh Q, RPH-CPP   100 mg at 04/18/21 1234   feeding supplement (NEPRO CARB STEADY) liquid 237 mL  237 mL Oral Q24H Charlynne Cousins, MD   237 mL at 04/17/21 1243   hydrALAZINE (APRESOLINE) tablet 100 mg  100 mg Oral BID Charlynne Cousins, MD   100 mg at 04/18/21 1234   hydrALAZINE (APRESOLINE) tablet 25 mg  25 mg Oral Q6H PRN Charlynne Cousins, MD       HYDROcodone-acetaminophen (NORCO/VICODIN) 5-325 MG per tablet 1-2 tablet  1-2 tablet Oral Q4H PRN Toy Baker, MD       ipratropium-albuterol (DUONEB)  0.5-2.5 (3) MG/3ML nebulizer solution 3 mL  3 mL Nebulization Q6H Doutova, Anastassia, MD   3 mL at 04/18/21 0828   isosorbide mononitrate (IMDUR) 24 hr tablet 30 mg  30 mg Oral Daily Charlynne Cousins, MD   30 mg at 04/18/21 1235   ketotifen (ZADITOR) 0.025 % ophthalmic solution 1 drop  1 drop Both Eyes BID Toy Baker, MD   1 drop at 04/18/21 1238   melatonin tablet 3 mg  3 mg Oral QHS Charlynne Cousins, MD   3 mg at 04/17/21 2128   memantine (NAMENDA) tablet 5 mg  5 mg Oral Daily Toy Baker, MD   5 mg at 04/18/21 1234   mometasone-formoterol (DULERA) 200-5 MCG/ACT inhaler 2 puff  2 puff Inhalation BID Toy Baker, MD   2 puff at 04/18/21 [id number]   multivitamin (RENA-VIT) tablet 1 tablet  1 tablet Oral QHS Charlynne Cousins, MD   1 tablet at 04/17/21 2128   pantoprazole (PROTONIX) EC tablet 40 mg  40 mg Oral [id number] Vena Rua, PA-C   40 mg at 04/18/21 [id number]   [START ON 04/19/2021] predniSONE (DELTASONE) tablet 20 mg  20 mg Oral Q breakfast Charlynne Cousins, MD       QUEtiapine (SEROQUEL) tablet 25 mg  25 mg Oral QHS PRN Charlynne Cousins, MD       umeclidinium bromide (INCRUSE ELLIPTA) 62.5 MCG/INH 1 puff  1 puff Inhalation Daily Charlynne Cousins, MD   1 puff at 04/18/21 0831    No Known Allergies   REVIEW OF SYSTEMS:   '[X]'$  denotes positive finding, '[ ]'$  denotes negative finding Cardiac  Comments:  Chest pain or chest pressure:    Shortness of breath upon exertion:    Short of breath when lying flat:    Irregular heart rhythm:        Vascular    Pain in calf, thigh, or hip brought on by ambulation:    Pain in feet at night that wakes you up from your sleep:     Blood clot in your veins:    Leg swelling:         Pulmonary    Oxygen at home:    Productive cough:     Wheezing:         Neurologic    Sudden weakness in arms or legs:     Sudden numbness in  arms or legs:     Sudden onset of difficulty speaking or slurred speech:     Temporary loss of vision in one eye:     Problems with dizziness:         Gastrointestinal    Blood in stool:     Vomited blood:         Genitourinary    Burning when urinating:     Blood in urine:        Psychiatric    Major depression:         Hematologic    Bleeding problems:    Problems with blood clotting too easily:        Skin    Rashes or ulcers:        Constitutional    Fever or chills:      PHYSICAL EXAMINATION:  Vitals:   04/18/21 1200 04/18/21 1210 04/18/21 1220 04/18/21 1245  BP: (!) 129/58 (!) 190/70 (!) 182/74 (!) 174/76  Pulse: (!) 53 (!) 49 (!) 50   Resp: '14 12 13 18  '$ Temp: 97.6 F (36.4 C)     TempSrc: Oral     SpO2: 100% 99% 100% 100%  Weight:      Height:        General:  WDWN in NAD; vital signs documented above Gait: Not observed HENT: WNL, normocephalic Pulmonary: normal non-labored breathing Cardiac: irregular HR Abdomen: soft, NT, no masses Skin: without rashes Vascular Exam/Pulses:  Right Left  Radial 2+ (normal) 2+ (normal)   Musculoskeletal: no muscle wasting or atrophy  Neurologic: A&O X 3;  No focal weakness or paresthesias are detected Psychiatric:  The pt has Normal affect.   Non-Invasive Vascular Imaging:   Vein map pending Adequate L basilic and R cephalic based on vein mapping performed at [facility] 09/2020    ASSESSMENT/PLAN:: 80 y.o. male with ESRD on HD in need of new permanent dialysis access  - L arm AVG thrombosed; currently dialyzing via R IJ TDC - Adequate R cephalic vein as of 0000000 vein map; will get new vein map prior to discharge home - Patient and wife express their desire for patient to discharge home prior to access placement - Plan will be for right arm AV fistula versus graft as an outpatient -Ok for discharge from vascular standpoint after vein mapping done - On call surgeon Dr. Donzetta Matters will evaluate the patient later today and provide further treatment plans   Dagoberto Ligas,  PA-C [facility] [phone]  I have independently interviewed and examined patient and agree with PA assessment and plan above.  Previous left upper arm AV graft is thrombosed patient would like to switch to right side.  Previous vein mapping demonstrated marginal cephalic vein on the right and he has multiple IVs in his right upper extremity in place at this time.  We will plan to repeat his vein mapping that was performed previously in [facility].  Patient desires for discharge home week and we can schedule permanent dialysis access creation as an outpatient pending vein mapping.  Brunetta Newingham C. Donzetta Matters, MD [facility] [place] Office: [phone] Pager: [phone]

## 2021-04-18 NOTE — Anesthesia Preprocedure Evaluation (Signed)
Anesthesia Evaluation  Patient identified by MRN, date of birth, ID band Patient awake    Reviewed: Allergy & Precautions, H&P , NPO status , Patient's Chart, lab work & pertinent test results  Airway Mallampati: II   Neck ROM: full    Dental   Pulmonary COPD,    breath sounds clear to auscultation       Cardiovascular hypertension, + CAD and +CHF   Rhythm:regular Rate:Normal     Neuro/Psych CVA    GI/Hepatic   Endo/Other    Renal/GU ESRF and DialysisRenal disease     Musculoskeletal  (+) Arthritis ,   Abdominal   Peds  Hematology  (+) Blood dyscrasia, anemia ,   Anesthesia Other Findings   Reproductive/Obstetrics                             Anesthesia Physical Anesthesia Plan  ASA: 3  Anesthesia Plan: MAC   Post-op Pain Management:    Induction: Intravenous  PONV Risk Score and Plan: 1 and Propofol infusion and Treatment may vary due to age or medical condition  Airway Management Planned: Nasal Cannula  Additional Equipment:   Intra-op Plan:   Post-operative Plan:   Informed Consent: I have reviewed the patients History and Physical, chart, labs and discussed the procedure including the risks, benefits and alternatives for the proposed anesthesia with the patient or authorized representative who has indicated his/her understanding and acceptance.     Dental advisory given  Plan Discussed with: CRNA, Anesthesiologist and Surgeon  Anesthesia Plan Comments:         Anesthesia Quick Evaluation

## 2021-04-18 NOTE — Interval H&P Note (Signed)
History and Physical Interval Note:  04/18/2021 11:02 AM  Anthony [name]  has presented today for surgery, with the diagnosis of anemia, FOBT +.  The various methods of treatment have been discussed with the patient and family. After consideration of risks, benefits and other options for treatment, the patient has consented to  Procedure(s): ENTEROSCOPY (N/A) as a surgical intervention.  The patient's history has been reviewed, patient examined, no change in status, stable for surgery.  I have reviewed the patient's chart and labs.  Questions were answered to the patient's satisfaction.     Jackquline Denmark

## 2021-04-18 NOTE — Plan of Care (Signed)
  Problem: Coping: Goal: Level of anxiety will decrease Outcome: Progressing   Problem: Pain Managment: Goal: General experience of comfort will improve Outcome: Progressing   Problem: Safety: Goal: Ability to remain free from injury will improve Outcome: Progressing   Problem: Skin Integrity: Goal: Risk for impaired skin integrity will decrease Outcome: Progressing   

## 2021-04-18 NOTE — Progress Notes (Addendum)
[facility] PROGRESS NOTE    Progress Note  Anthony [name]  [id number] DOB: May 18, 1941 DOA: 04/16/2021 PCP: Isaac Bliss, Rayford Halsted, MD     Brief Narrative:   Anthony [name] is an 80 y.o. male past medical history of essential hypertension, atrial fibrillation not on anticoagulation due to fall risk and history of bleeding, end-stage renal disease at [facility], vascular dementia, alcohol abuse, recent COVID infection but has been vaccinated and boosted.  Presented to the ED after being sent from dialysis as her hemoglobin was found to have a hemoglobin of 6.5, FOBT was positive.    Assessment/Plan:   Symptomatic anemia with a positive FOBT/ABLA 2/2 to angiodisplasia: He status post 2 units of packed red blood cells her hemoglobin this morning is 8.5.  Only on Protonix twice a day. GI was consulted to perform endoscopy they found 2 nonbleeding angiodysplastic lesions treated with argon plasma. Hold aspirin resumed per GI. Therapy evaluated the patient recommended no home health PT. Probably home tomorrow  COPD with acute exacerbation: Improve saturations satting greater 90%. Continue Doxy steroids and inhalers.  Check saturations with ambulation.  Positive serum QuantiFERON: The patient is asymptomatic, chest x-ray showed mild basilar atelectasis versus scarring. Discussed with ID he is currently asymptomatic they will see medicine outpatient.  End-stage renal disease: Nephrology has been consulted for HD today.  Tobacco abuse: She refused nicotine patch.  Paroxysmal atrial fibrillation: Rate controlled on Coreg not on anticoagulation due to fall risk and GI bleed.  Essential hypertension: Uncontrolled elevated today, resume all of her antihypertensive medication. Titrate Norvasc up use hydralazine IV as needed for systolic blood pressure greater than 160.  Chronic diastolic heart failure: Resume Coreg and hydralazine fluid status per  renal.  CAD: Hold aspirin Hemoccult positive.  Hyperlipidemia: Excellent continue statins.  Prolonged QTC: Monitor.  Dementia without behavioral disturbances: At risk of aspiration and acute confusional state melatonin at night Seroquel at bedtime as needed.   DVT prophylaxis: lovenox Family Communication:wife Status is: Inpatient  Remains inpatient appropriate because:Hemodynamically unstable  Dispo: The patient is from: Home              Anticipated d/c is to: Home              Patient currently is not medically stable to d/c.   Difficult to place patient No        Code Status:     Code Status Orders  (From admission, onward)           Start     Ordered   04/16/21 1930  Full code  Continuous        04/16/21 1934           Code Status History     Date Active Date Inactive Code Status Order ID Comments User Context   02/29/2020 1647 03/02/2020 2311 Full Code [id number]  Mckinley Jewel, MD ED         IV Access:   Peripheral IV   Procedures and diagnostic studies:   DG Chest Port 1 View  Result Date: 04/16/2021 CLINICAL DATA:  Shortness of breath. EXAM: PORTABLE CHEST 1 VIEW COMPARISON:  February 29, 2020. FINDINGS: The heart size and mediastinal contours are within normal limits. Interval placement of right internal jugular dialysis catheter with distal tip in expected position of the SVC. No pneumothorax or pleural effusion is noted. Right lung is clear. Mild left basilar subsegmental atelectasis or scarring is noted. The visualized skeletal structures are  unremarkable. IMPRESSION: Mild left basilar subsegmental atelectasis or scarring. Aortic Atherosclerosis (ICD10-I70.0). Electronically Signed   By: Marijo Conception M.D.   On: 04/16/2021 19:27   ECHOCARDIOGRAM COMPLETE  Result Date: 04/17/2021    ECHOCARDIOGRAM REPORT   Patient Name:   Anthony [name] Date of Exam: 04/17/2021 Medical Rec #:  [id number]      Height:       66.5 in Accession #:     [id number]     Weight:       137.4 lb Date of Birth:  02-07-1941       BSA:          1.714 m Patient Age:    31 years       BP:           171/101 mmHg Patient Gender: M              HR:           65 bpm. Exam Location:  Inpatient Procedure: 2D Echo, 3D Echo, Color Doppler, Cardiac Doppler and Strain Analysis Indications:    R06.9 DOE  History:        Patient has prior history of Echocardiogram examinations, most                 recent 03/01/2020. CAD, COPD, Arrythmias:Atrial Fibrillation;                 Risk Factors:Hypertension and Dyslipidemia.  Sonographer:    Raquel Sarna Senior RDCS Referring Phys: [place]  Sonographer Comments: Poor parasternal window due to COPD IMPRESSIONS  1. Left ventricular ejection fraction, by estimation, is 50 to 55%. The left ventricle has low normal function. The left ventricle has no regional wall motion abnormalities. There is mild left ventricular hypertrophy. Left ventricular diastolic parameters are consistent with Grade II diastolic dysfunction (pseudonormalization). Elevated left atrial pressure.  2. Right ventricular systolic function is normal. The right ventricular size is normal. There is normal pulmonary artery systolic pressure. The estimated right ventricular systolic pressure is 0000000 mmHg.  3. Left atrial size was moderately dilated.  4. The mitral valve is degenerative. Trivial mitral valve regurgitation. Mild mitral stenosis. The mean mitral valve gradient is 4.0 mmHg at HR 65bpm. MVA 1.9 cm^2 by coninuity equation.  5. The aortic valve was not well visualized. Aortic valve regurgitation is not visualized. No aortic stenosis is present.  6. The inferior vena cava is normal in size with <50% respiratory variability, suggesting right atrial pressure of 8 mmHg. FINDINGS  Left Ventricle: Left ventricular ejection fraction, by estimation, is 50 to 55%. The left ventricle has low normal function. The left ventricle has no regional wall motion abnormalities. The  left ventricular internal cavity size was normal in size. There is mild left ventricular hypertrophy. Left ventricular diastolic parameters are consistent with Grade II diastolic dysfunction (pseudonormalization). Elevated left atrial pressure. Right Ventricle: The right ventricular size is normal. No increase in right ventricular wall thickness. Right ventricular systolic function is normal. There is normal pulmonary artery systolic pressure. The tricuspid regurgitant velocity is 2.17 m/s, and  with an assumed right atrial pressure of 8 mmHg, the estimated right ventricular systolic pressure is 0000000 mmHg. Left Atrium: Left atrial size was moderately dilated. Right Atrium: Right atrial size was normal in size. Pericardium: There is no evidence of pericardial effusion. Mitral Valve: The mitral valve is degenerative in appearance. Trivial mitral valve regurgitation. Mild mitral valve stenosis. MV peak gradient, 7.1 mmHg. The mean  mitral valve gradient is 4.0 mmHg. Tricuspid Valve: The tricuspid valve is normal in structure. Tricuspid valve regurgitation is trivial. Aortic Valve: The aortic valve was not well visualized. Aortic valve regurgitation is not visualized. No aortic stenosis is present. Pulmonic Valve: The pulmonic valve was not well visualized. Pulmonic valve regurgitation is trivial. Aorta: The aortic root is normal in size and structure. Venous: The inferior vena cava is normal in size with less than 50% respiratory variability, suggesting right atrial pressure of 8 mmHg. IAS/Shunts: The interatrial septum was not well visualized.  LEFT VENTRICLE PLAX 2D LVIDd:         5.00 cm  Diastology LVIDs:         3.80 cm  LV e' medial:    3.81 cm/s LV PW:         1.10 cm  LV E/e' medial:  34.6 LV IVS:        1.10 cm  LV e' lateral:   6.20 cm/s LVOT diam:     2.00 cm  LV E/e' lateral: 21.3 LV SV:         80 LV SV Index:   47 LVOT Area:     3.14 cm                          3D Volume EF:                         3D EF:         48 %                         LV EDV:       181 ml                         LV ESV:       94 ml                         LV SV:        87 ml RIGHT VENTRICLE RV S prime:     13.80 cm/s TAPSE (M-mode): 2.4 cm LEFT ATRIUM             Index       RIGHT ATRIUM           Index LA diam:        4.90 cm 2.86 cm/m  RA Area:     15.80 cm LA Vol (A2C):   81.2 ml 47.36 ml/m RA Volume:   36.90 ml  21.52 ml/m LA Vol (A4C):   62.5 ml 36.45 ml/m LA Biplane Vol: 75.5 ml 44.04 ml/m  AORTIC VALVE LVOT Vmax:   117.00 cm/s LVOT Vmean:  67.100 cm/s LVOT VTI:    0.254 m  AORTA Ao Root diam: 3.10 cm MITRAL VALVE                TRICUSPID VALVE MV Area (PHT): 3.40 cm     TR Peak grad:   18.8 mmHg MV Area VTI:   1.88 cm     TR Vmax:        217.00 cm/s MV Peak grad:  7.1 mmHg MV Mean grad:  4.0 mmHg     SHUNTS MV Vmax:       1.33 m/s     Systemic VTI:  0.25 m  MV Vmean:      101.0 cm/s   Systemic Diam: 2.00 cm MV Decel Time: 223 msec MV E velocity: 132.00 cm/s MV A velocity: 137.00 cm/s MV E/A ratio:  0.96 Oswaldo Milian MD Electronically signed by Oswaldo Milian MD Signature Date/Time: 04/17/2021/12:10:49 PM    Final      Medical Consultants:   None.   Subjective:    Anthony [name] no new complaints hungry.  Objective:    Vitals:   04/18/21 0843 04/18/21 0900 04/18/21 1048 04/18/21 1200  BP: (!) 170/80  (!) 208/82 (!) 129/58  Pulse: 72   (!) 53  Resp:    14  Temp: 98.1 F (36.7 C)   97.6 F (36.4 C)  TempSrc: Oral   Oral  SpO2:    100%  Weight:  67.3 kg    Height:  '5\' 7"'$  (1.702 m)     SpO2: 100 % O2 Flow Rate (L/min): 2 L/min   Intake/Output Summary (Last 24 hours) at 04/18/2021 1213 Last data filed at 04/18/2021 1200 Gross per 24 hour  Intake 300 ml  Output 1500 ml  Net -1200 ml   Filed Weights   04/17/21 1328 04/18/21 0900  Weight: 68.2 kg 67.3 kg    Exam: General exam: In no acute distress. Respiratory system: Good air movement and clear to auscultation. Cardiovascular  system: S1 & S2 heard, RRR. No JVD. Gastrointestinal system: Abdomen is nondistended, soft and nontender.  Extremities: No pedal edema. Skin: No rashes, lesions or ulcers  Data Reviewed:    Labs: Basic Metabolic Panel: Recent Labs  Lab 04/16/21 1046 04/17/21 0620  NA 143 141  K 3.8 4.4  CL 103 101  CO2 28 25  GLUCOSE 124* 126*  BUN 50* 56*  CREATININE 12.20* 13.12*  CALCIUM 9.2 8.9  MG 2.5* 2.4  PHOS  --  3.3    GFR Estimated Creatinine Clearance: 4.4 mL/min (A) (by C-G formula based on SCr of 13.12 mg/dL (H)). Liver Function Tests: Recent Labs  Lab 04/17/21 0620  AST 21  22  ALT 22  22  ALKPHOS 55  56  BILITOT 0.5  0.5  PROT 6.8  6.6  ALBUMIN 3.2*  3.3*    No results for input(s): LIPASE, AMYLASE in the last 168 hours. Recent Labs  Lab 04/16/21 2030  AMMONIA 59*    Coagulation profile No results for input(s): INR, PROTIME in the last 168 hours. COVID-19 Labs  Recent Labs    04/17/21 0620  FERRITIN 182     Lab Results  Component Value Date   SARSCOV2NAA POSITIVE (A) 04/16/2021   [place] NEGATIVE 02/29/2020    CBC: Recent Labs  Lab 04/16/21 1046 04/17/21 0620  WBC 4.0 4.9  NEUTROABS 2.8 4.1  HGB 6.5* 8.5*  HCT 21.6* 27.9*  MCV 101.9* 98.2  PLT 212 179    Cardiac Enzymes: No results for input(s): CKTOTAL, CKMB, CKMBINDEX, TROPONINI in the last 168 hours. BNP (last 3 results) No results for input(s): PROBNP in the last 8760 hours. CBG: No results for input(s): GLUCAP in the last 168 hours. D-Dimer: No results for input(s): DDIMER in the last 72 hours. Hgb A1c: No results for input(s): HGBA1C in the last 72 hours. Lipid Profile: No results for input(s): CHOL, HDL, LDLCALC, TRIG, CHOLHDL, LDLDIRECT in the last 72 hours. Thyroid function studies: Recent Labs    04/17/21 0620  TSH 0.458    Anemia work up: Recent Labs    04/17/21 [place]  FOLATE 7.2  FERRITIN 182  TIBC 231*  IRON 66  RETICCTPCT  4.0*    Sepsis Labs: Recent Labs  Lab 04/16/21 1046 04/17/21 0620  WBC 4.0 4.9    Microbiology Recent Results (from the past 240 hour(s))  SARS CORONAVIRUS 2 (TAT 6-24 HRS) Nasopharyngeal Nasopharyngeal Swab     Status: Abnormal   Collection Time: 04/16/21  7:06 PM   Specimen: Nasopharyngeal Swab  Result Value Ref Range Status   SARS Coronavirus 2 POSITIVE (A) NEGATIVE Final    Comment: (NOTE) SARS-CoV-2 target nucleic acids are DETECTED.  The SARS-CoV-2 RNA is generally detectable in upper and lower respiratory specimens during the acute phase of infection. Positive results are indicative of the presence of SARS-CoV-2 RNA. Clinical correlation with patient history and other diagnostic information is  necessary to determine patient infection status. Positive results do not rule out bacterial infection or co-infection with other viruses.  The expected result is Negative.  Fact Sheet for Patients: SugarRoll.be  Fact Sheet for Healthcare Providers: https://www.woods-mathews.com/  This test is not yet approved or cleared by the [place] FDA and  has been authorized for detection and/or diagnosis of SARS-CoV-2 by FDA under an Emergency Use Authorization (EUA). This EUA will remain  in effect (meaning this test can be used) for the duration of the COVID-19 declaration under Section 564(b)(1) of the Act, 21 U. S.C. section 360bbb-3(b)(1), unless the authorization is terminated or revoked sooner.   Performed at [facility], [place] [address], [place],  [address]      Medications:    [MAR Hold] sodium chloride   Intravenous Once   [MAR Hold] amLODipine  5 mg Oral Daily   [MAR Hold] atorvastatin  20 mg Oral Daily   [MAR Hold] calcium acetate  2,001 mg Oral TID WC   [MAR Hold] carvedilol  6.25 mg Oral BID WC   [MAR Hold] Chlorhexidine Gluconate Cloth  6 each Topical Q0600   [MAR Hold] doxycycline  100 mg Oral Q12H    [MAR Hold] feeding supplement (NEPRO CARB STEADY)  237 mL Oral Q24H   [MAR Hold] hydrALAZINE  100 mg Oral BID   [MAR Hold] ipratropium-albuterol  3 mL Nebulization Q6H   [MAR Hold] isosorbide mononitrate  30 mg Oral Daily   [MAR Hold] ketotifen  1 drop Both Eyes BID   [MAR Hold] melatonin  3 mg Oral QHS   [MAR Hold] memantine  5 mg Oral Daily   [MAR Hold] mometasone-formoterol  2 puff Inhalation BID   [MAR Hold] multivitamin  1 tablet Oral QHS   [MAR Hold] pantoprazole  40 mg Oral Q0600   [MAR Hold] predniSONE  40 mg Oral Q breakfast   [MAR Hold] sodium chloride flush  3 mL Intravenous Q12H   [MAR Hold] umeclidinium bromide  1 puff Inhalation Daily   Continuous Infusions:  [MAR Hold] sodium chloride     [MAR Hold] sodium chloride        LOS: 2 days   Charlynne Cousins  [facility]  04/18/2021, 12:13 PM

## 2021-04-18 NOTE — Progress Notes (Signed)
Patient off unit to endoscopy, remains NPO, no pain or distress noted.

## 2021-04-19 ENCOUNTER — Ambulatory Visit: Payer: BLUE CROSS/BLUE SHIELD | Admitting: Internal Medicine

## 2021-04-19 ENCOUNTER — Encounter (HOSPITAL_COMMUNITY): Payer: BLUE CROSS/BLUE SHIELD

## 2021-04-19 DIAGNOSIS — I5032 Chronic diastolic (congestive) heart failure: Secondary | ICD-10-CM | POA: Diagnosis not present

## 2021-04-19 DIAGNOSIS — D649 Anemia, unspecified: Secondary | ICD-10-CM | POA: Diagnosis not present

## 2021-04-19 DIAGNOSIS — N186 End stage renal disease: Secondary | ICD-10-CM | POA: Diagnosis not present

## 2021-04-19 DIAGNOSIS — E440 Moderate protein-calorie malnutrition: Secondary | ICD-10-CM

## 2021-04-19 DIAGNOSIS — F0390 Unspecified dementia without behavioral disturbance: Secondary | ICD-10-CM | POA: Diagnosis not present

## 2021-04-19 MED ORDER — PANTOPRAZOLE SODIUM 40 MG PO TBEC: 40.0000 mg | DELAYED_RELEASE_TABLET | Freq: Two times a day (BID) | ORAL | 1 refills | Status: DC

## 2021-04-19 MED ORDER — HEPARIN SODIUM (PORCINE) 1000 UNIT/ML IJ SOLN
INTRAMUSCULAR | Status: AC
  Filled 2021-04-19: qty 4

## 2021-04-19 NOTE — Plan of Care (Signed)

## 2021-04-19 NOTE — Anesthesia Postprocedure Evaluation (Signed)
Anesthesia Post Note  Patient: Anthony [name]  Procedure(s) Performed: ENTEROSCOPY HOT HEMOSTASIS (ARGON PLASMA COAGULATION/BICAP) SUBMUCOSAL TATTOO INJECTION     Patient location during evaluation: Endoscopy Anesthesia Type: MAC Level of consciousness: awake and alert Pain management: pain level controlled Vital Signs Assessment: post-procedure vital signs reviewed and stable Respiratory status: spontaneous breathing, nonlabored ventilation, respiratory function stable and patient connected to nasal cannula oxygen Cardiovascular status: stable and blood pressure returned to baseline Postop Assessment: no apparent nausea or vomiting Anesthetic complications: no   No notable events documented.  Last Vitals:  Vitals:   04/19/21 0900 04/19/21 0928  BP: (!) 161/77 (!) 150/73  Pulse: (!) 57 (!) 49  Resp: 12 11  Temp:    SpO2: 100%     Last Pain:  Vitals:   04/19/21 0755  TempSrc: Axillary  PainSc:                  [place]

## 2021-04-19 NOTE — Progress Notes (Addendum)
CM spoke with Linus Orn, confirmed pt has not been established yet with outpt HD center. Per Marya Amsler due to +COVID status- arrangements are still pending. Will need to f/u next week regarding outpt HD needs and arrangements- Althia Forts will be covering [phone]- spoke with wife regarding COVID status- confirmed that pt was +COVID a month ago- pt tested at [place], wife referred to brother for testing details- call made to brother- [phone]- per Abe People pt was tested positive at [facility]- will work on getting wife to sign release for hospital to send test results regarding COVID testing there. (Per Marya Amsler this will help to show COVID recovery and HD placement for outpt-if we can get the covid results)

## 2021-04-19 NOTE — Discharge Summary (Addendum)
Physician Discharge Summary  Mortez Meadowcroft [id number] DOB: 05/15/1941 DOA: 04/16/2021  PCP: Isaac Bliss, Rayford Halsted, MD  Admit date: 04/16/2021 Discharge date: 04/19/2021  Admitted From: Home Disposition:  Home  Recommendations for Outpatient Follow-up:  Follow up with PCP in 1-2 weeks Please obtain BMP/CBC in one week   Home Health:No Equipment/Devices:none  Discharge Condition:stable CODE STATUS:Full Diet recommendation: Heart Healthy  Brief/Interim Summary: 80 y.o. male past medical history of essential hypertension, atrial fibrillation not on anticoagulation due to fall risk and history of bleeding, end-stage renal disease at [facility], vascular dementia, alcohol abuse, recent COVID infection but has been vaccinated and boosted.  Presented to the ED after being sent from dialysis as her hemoglobin was found to have a hemoglobin of 6.5, FOBT was positive  Discharge Diagnoses:  Active Problems:   ESRD (end stage renal disease) on dialysis (HCC)   HTN (hypertension)   Chronic diastolic heart failure (HCC)   PAF (paroxysmal atrial fibrillation) (HCC)   Tobacco use disorder   Anemia   Symptomatic anemia   COPD (chronic obstructive pulmonary disease) (HCC)   CAD (coronary artery disease)   HLD (hyperlipidemia)   Prolonged QT interval   Abnormal ECG   Dementia without behavioral disturbance ([place])   COPD with acute exacerbation (HCC)   Occult blood positive stool   Malnutrition of moderate degree  Symptomatic anemia with a positive FOBT/acute blood loss anemia secondary to angiodysplasia of the duodenum: Admitted to the hospital transfused 2 units of packed red blood cells as his hemoglobin was 6, started on Protonix twice a day, GI was consulted who performed endoscopy that found to nonbleeding angiodysplastic lesion treated with argon plasma. His aspirin was held on admission.  Physical therapy evaluated the patient and recommended home health PT. Will  resume aspirin as an outpatient continue Protonix twice a day.  COPD with acute exacerbation: He was started on oxygen supplementation started on steroids antibiotics and inhalers his saturations improved steroids were discontinued he completed his course of antibiotics in house.  Positive serum QuantiFERON probably latent tuberculosis: Currently asymptomatic no cough fever shortness of breath, he will follow-up with infectious disease or with the health department as an outpatient.  End-stage renal disease: Renal was consulted and he was continued on his regular days of dialysis. Vascular was consulted as it was a work concern of left arm AV thrombosis vein mapping was done. Vascular surgery recommended to follow-up with them as an outpatient to evaluate further vascular access.  Tobacco abuse: He refuses nicotine patch.  Paroxysmal atrial fibrillation: Rate controlled on Coreg not a candidate for anticoagulation due to fall risk and GI bleed.  Uncontrolled essential hypertension: All of his antihypertensive medications were resumed, once we started dialysis his blood pressure started to improve. Question if he has been compliant with his dialysis over [facility]. No changes made to his medication.  CAD: Aspirin was held on admission he will resume as an outpatient.  Hyperlipidemia: Continue statins.  Dementia without behavioral disturbances:  Moderate protein caloric malnutrition   Discharge Instructions  Discharge Instructions     Diet - low sodium heart healthy   Complete by: As directed    Increase activity slowly   Complete by: As directed       Allergies as of 04/19/2021   No Known Allergies      Medication List     TAKE these medications    acetaminophen 500 MG tablet Commonly known as: TYLENOL Take 1,000 mg by  mouth every 8 (eight) hours as needed for moderate pain.   Advair HFA 45-21 MCG/ACT inhaler Generic drug:  fluticasone-salmeterol INHALE 2 PUFFS INTO THE LUNGS IN THE MORNING AND AT BEDTIME   albuterol 108 (90 Base) MCG/ACT inhaler Commonly known as: Proventil HFA Inhale 2 puffs into the lungs every 4 (four) hours as needed for wheezing or shortness of breath.   amLODipine 5 MG tablet Commonly known as: NORVASC Take 5 mg by mouth daily.   Aspirin Low Dose 81 MG chewable tablet Generic drug: aspirin Chew 81 mg by mouth daily.   atorvastatin 20 MG tablet Commonly known as: LIPITOR TAKE 1 TABLET BY MOUTH EVERYDAY AT BEDTIME What changed: See the new instructions.   budesonide-formoterol 160-4.5 MCG/ACT inhaler Commonly known as: SYMBICORT Inhale 2 puffs into the lungs 2 (two) times daily.   calcium acetate 667 MG capsule Commonly known as: PHOSLO Take 2,001 mg by mouth 3 (three) times daily with meals.   carboxymethylcellulose 0.5 % Soln Commonly known as: REFRESH PLUS Place 1 drop into both eyes daily as needed (dry eyes).   carvedilol 6.25 MG tablet Commonly known as: COREG Take 1 tablet (6.25 mg total) by mouth 2 (two) times daily with a meal.   hydrALAZINE 100 MG tablet Commonly known as: APRESOLINE Take 100 mg by mouth 2 (two) times daily.   isosorbide mononitrate 30 MG 24 hr tablet Commonly known as: IMDUR Take 30 mg by mouth daily.   ketotifen 0.025 % ophthalmic solution Commonly known as: ZADITOR Place 1 drop into both eyes 2 (two) times daily.   memantine 5 MG tablet Commonly known as: NAMENDA Take 5 mg by mouth daily.   methocarbamol 500 MG tablet Commonly known as: Robaxin Take 1 tablet (500 mg total) by mouth every 8 (eight) hours as needed for muscle spasms.   nicotine 14 mg/24hr patch Commonly known as: NICODERM CQ - dosed in mg/24 hours Place 1 patch (14 mg total) onto the skin daily.   pantoprazole 40 MG tablet Commonly known as: PROTONIX Take 40 mg by mouth daily before breakfast.   predniSONE 20 MG tablet Commonly known as: DELTASONE Take 20  mg by mouth 2 (two) times daily with a meal.   Spiriva Respimat 2.5 MCG/ACT Aers Generic drug: Tiotropium Bromide Monohydrate Inhale 2 puffs into the lungs daily.   traZODone 50 MG tablet Commonly known as: DESYREL TAKE 1 TO 2 TABLETS AT BEDTIME AS NEEDED        Follow-up Information     Tommy Medal, Lavell Islam, MD Follow up in 1 week(s).   Specialty: Infectious Diseases Why: call for appintemnet Contact information: [address] [address] [phone]                No Known Allergies  Consultations: Gastroenterology Nephrology Vascular surgery   Procedures/Studies: [facility] Chest Port 1 View  Result Date: 04/16/2021 CLINICAL DATA:  Shortness of breath. EXAM: PORTABLE CHEST 1 VIEW COMPARISON:  February 29, 2020. FINDINGS: The heart size and mediastinal contours are within normal limits. Interval placement of right internal jugular dialysis catheter with distal tip in expected position of the SVC. No pneumothorax or pleural effusion is noted. Right lung is clear. Mild left basilar subsegmental atelectasis or scarring is noted. The visualized skeletal structures are unremarkable. IMPRESSION: Mild left basilar subsegmental atelectasis or scarring. Aortic Atherosclerosis (ICD10-I70.0). Electronically Signed   By: Marijo Conception M.D.   On: 04/16/2021 19:27   ECHOCARDIOGRAM COMPLETE  Result Date: 04/17/2021  ECHOCARDIOGRAM REPORT   Patient Name:   MERVEL SPEYRER Date of Exam: 04/17/2021 Medical Rec #:  [id number]      Height:       66.5 in Accession #:    [id number]     Weight:       137.4 lb Date of Birth:  1941-03-14       BSA:          1.714 m Patient Age:    80 years       BP:           171/101 mmHg Patient Gender: M              HR:           65 bpm. Exam Location:  Inpatient Procedure: 2D Echo, 3D Echo, Color Doppler, Cardiac Doppler and Strain Analysis Indications:    R06.9 DOE  History:        Patient has prior history of Echocardiogram examinations, most                  recent 03/01/2020. CAD, COPD, Arrythmias:Atrial Fibrillation;                 Risk Factors:Hypertension and Dyslipidemia.  Sonographer:    Raquel Sarna Senior RDCS Referring Phys: [place]  Sonographer Comments: Poor parasternal window due to COPD IMPRESSIONS  1. Left ventricular ejection fraction, by estimation, is 50 to 55%. The left ventricle has low normal function. The left ventricle has no regional wall motion abnormalities. There is mild left ventricular hypertrophy. Left ventricular diastolic parameters are consistent with Grade II diastolic dysfunction (pseudonormalization). Elevated left atrial pressure.  2. Right ventricular systolic function is normal. The right ventricular size is normal. There is normal pulmonary artery systolic pressure. The estimated right ventricular systolic pressure is 0000000 mmHg.  3. Left atrial size was moderately dilated.  4. The mitral valve is degenerative. Trivial mitral valve regurgitation. Mild mitral stenosis. The mean mitral valve gradient is 4.0 mmHg at HR 65bpm. MVA 1.9 cm^2 by coninuity equation.  5. The aortic valve was not well visualized. Aortic valve regurgitation is not visualized. No aortic stenosis is present.  6. The inferior vena cava is normal in size with <50% respiratory variability, suggesting right atrial pressure of 8 mmHg. FINDINGS  Left Ventricle: Left ventricular ejection fraction, by estimation, is 50 to 55%. The left ventricle has low normal function. The left ventricle has no regional wall motion abnormalities. The left ventricular internal cavity size was normal in size. There is mild left ventricular hypertrophy. Left ventricular diastolic parameters are consistent with Grade II diastolic dysfunction (pseudonormalization). Elevated left atrial pressure. Right Ventricle: The right ventricular size is normal. No increase in right ventricular wall thickness. Right ventricular systolic function is normal. There is normal pulmonary  artery systolic pressure. The tricuspid regurgitant velocity is 2.17 m/s, and  with an assumed right atrial pressure of 8 mmHg, the estimated right ventricular systolic pressure is 0000000 mmHg. Left Atrium: Left atrial size was moderately dilated. Right Atrium: Right atrial size was normal in size. Pericardium: There is no evidence of pericardial effusion. Mitral Valve: The mitral valve is degenerative in appearance. Trivial mitral valve regurgitation. Mild mitral valve stenosis. MV peak gradient, 7.1 mmHg. The mean mitral valve gradient is 4.0 mmHg. Tricuspid Valve: The tricuspid valve is normal in structure. Tricuspid valve regurgitation is trivial. Aortic Valve: The aortic valve was not well visualized. Aortic valve regurgitation is not visualized. No aortic stenosis  is present. Pulmonic Valve: The pulmonic valve was not well visualized. Pulmonic valve regurgitation is trivial. Aorta: The aortic root is normal in size and structure. Venous: The inferior vena cava is normal in size with less than 50% respiratory variability, suggesting right atrial pressure of 8 mmHg. IAS/Shunts: The interatrial septum was not well visualized.  LEFT VENTRICLE PLAX 2D LVIDd:         5.00 cm  Diastology LVIDs:         3.80 cm  LV e' medial:    3.81 cm/s LV PW:         1.10 cm  LV E/e' medial:  34.6 LV IVS:        1.10 cm  LV e' lateral:   6.20 cm/s LVOT diam:     2.00 cm  LV E/e' lateral: 21.3 LV SV:         80 LV SV Index:   47 LVOT Area:     3.14 cm                          3D Volume EF:                         3D EF:        48 %                         LV EDV:       181 ml                         LV ESV:       94 ml                         LV SV:        87 ml RIGHT VENTRICLE RV S prime:     13.80 cm/s TAPSE (M-mode): 2.4 cm LEFT ATRIUM             Index       RIGHT ATRIUM           Index LA diam:        4.90 cm 2.86 cm/m  RA Area:     15.80 cm LA Vol (A2C):   81.2 ml 47.36 ml/m RA Volume:   36.90 ml  21.52 ml/m LA Vol (A4C):    62.5 ml 36.45 ml/m LA Biplane Vol: 75.5 ml 44.04 ml/m  AORTIC VALVE LVOT Vmax:   117.00 cm/s LVOT Vmean:  67.100 cm/s LVOT VTI:    0.254 m  AORTA Ao Root diam: 3.10 cm MITRAL VALVE                TRICUSPID VALVE MV Area (PHT): 3.40 cm     TR Peak grad:   18.8 mmHg MV Area VTI:   1.88 cm     TR Vmax:        217.00 cm/s MV Peak grad:  7.1 mmHg MV Mean grad:  4.0 mmHg     SHUNTS MV Vmax:       1.33 m/s     Systemic VTI:  0.25 m MV Vmean:      101.0 cm/s   Systemic Diam: 2.00 cm MV Decel Time: 223 msec MV E velocity: 132.00 cm/s MV A velocity: 137.00 cm/s MV E/A ratio:  0.96 Oswaldo Milian MD  Electronically signed by Oswaldo Milian MD Signature Date/Time: 04/17/2021/12:10:49 PM    Final    VAS [place] UPPER EXT VEIN MAPPING (PRE-OP AVF)  Result Date: 04/19/2021 [place] MAPPING Patient Name:  LEONARDO KOOB  Date of Exam:   04/18/2021 Medical Rec #: [id number]       Accession #:    [id number] Date of Birth: 08/08/1941        Patient Gender: M Patient Age:   79Y Exam Location:  [facility] Procedure:      VAS [place] UPPER EXT VEIN MAPPING (PRE-OP AVF) Referring Phys: [id number] Hunt Oris LIN --------------------------------------------------------------------------------  Indications: Pre-access. Performing Technologist: Rogelia Rohrer RVT, RDMS  Examination Guidelines: A complete evaluation includes B-mode imaging, spectral Doppler, color Doppler, and power Doppler as needed of all accessible portions of each vessel. Bilateral testing is considered an integral part of a complete examination. Limited examinations for reoccurring indications may be performed as noted. +-----------------+-------------+----------+--------------+ Right Cephalic   Diameter (cm)Depth (cm)   Findings    +-----------------+-------------+----------+--------------+ Shoulder             0.14        0.29                  +-----------------+-------------+----------+--------------+ Prox upper arm       0.09        0.25                   +-----------------+-------------+----------+--------------+ Mid upper arm        0.09        0.14                  +-----------------+-------------+----------+--------------+ Dist upper arm       0.11        0.24                  +-----------------+-------------+----------+--------------+ Antecubital fossa    0.08        0.10                  +-----------------+-------------+----------+--------------+ Prox forearm                            not visualized +-----------------+-------------+----------+--------------+ Mid forearm                             not visualized +-----------------+-------------+----------+--------------+ Dist forearm                            not visualized +-----------------+-------------+----------+--------------+ Wrist                                   not visualized +-----------------+-------------+----------+--------------+ +-----------------+-------------+----------+--------------+ Right Basilic    Diameter (cm)Depth (cm)   Findings    +-----------------+-------------+----------+--------------+ Prox upper arm       0.16                 thrombosed   +-----------------+-------------+----------+--------------+ Mid upper arm        0.18                 thrombosed   +-----------------+-------------+----------+--------------+ Dist upper arm       0.20                 thrombosed   +-----------------+-------------+----------+--------------+  Antecubital fossa    0.18                 thrombosed   +-----------------+-------------+----------+--------------+ Prox forearm                            not visualized +-----------------+-------------+----------+--------------+ Mid forearm                             not visualized +-----------------+-------------+----------+--------------+ Distal forearm                          not visualized +-----------------+-------------+----------+--------------+ Elbow                                    not visualized +-----------------+-------------+----------+--------------+ Wrist                                   not visualized +-----------------+-------------+----------+--------------+ Summary: Right: RUE - SVT in basilic vein from origin to [facility] fossa. Unable to        visualize past AC fossa.  *See table(s) above for measurements and observations.  Diagnosing physician: Deitra Mayo MD Electronically signed by Deitra Mayo MD on 04/19/2021 at 7:54:07 AM.    Final    (Echo, Carotid, EGD, Colonoscopy, ERCP)    Subjective: No complaints  Discharge Exam: Vitals:   04/19/21 1030 04/19/21 1100  BP: (!) 149/69 115/75  Pulse: 61 (!) 52  Resp: 19 (!) 23  Temp:    SpO2: 100% 100%   Vitals:   04/19/21 0959 04/19/21 1000 04/19/21 1030 04/19/21 1100  BP: (!) 153/71  (!) 149/69 115/75  Pulse: (!) 52 (!) 45 61 (!) 52  Resp: '11 10 19 '$ (!) 23  Temp:      TempSrc:      SpO2:  100% 100% 100%  Weight:      Height:        General: Pt is alert, awake, not in acute distress Cardiovascular: RRR, S1/S2 +, no rubs, no gallops Respiratory: CTA bilaterally, no wheezing, no rhonchi Abdominal: Soft, NT, ND, bowel sounds + Extremities: no edema, no cyanosis    The results of significant diagnostics from this hospitalization (including imaging, microbiology, ancillary and laboratory) are listed below for reference.     Microbiology: Recent Results (from the past 240 hour(s))  SARS CORONAVIRUS 2 (TAT 6-24 HRS) Nasopharyngeal Nasopharyngeal Swab     Status: Abnormal   Collection Time: 04/16/21  7:06 PM   Specimen: Nasopharyngeal Swab  Result Value Ref Range Status   SARS Coronavirus 2 POSITIVE (A) NEGATIVE Final    Comment: (NOTE) SARS-CoV-2 target nucleic acids are DETECTED.  The SARS-CoV-2 RNA is generally detectable in upper and lower respiratory specimens during the acute phase of infection. Positive results are indicative of the presence of  SARS-CoV-2 RNA. Clinical correlation with patient history and other diagnostic information is  necessary to determine patient infection status. Positive results do not rule out bacterial infection or co-infection with other viruses.  The expected result is Negative.  Fact Sheet for Patients: SugarRoll.be  Fact Sheet for Healthcare Providers: https://www.woods-mathews.com/  This test is not yet approved or cleared by the [place] FDA and  has been authorized for detection and/or diagnosis of SARS-CoV-2 by  FDA under an Emergency Use Authorization (EUA). This EUA will remain  in effect (meaning this test can be used) for the duration of the COVID-19 declaration under Section 564(b)(1) of the Act, 21 U. S.C. section 360bbb-3(b)(1), unless the authorization is terminated or revoked sooner.   Performed at [facility], [place] [address] [place], [place] [address]      Labs: BNP (last 3 results) No results for input(s): BNP in the last 8760 hours. Basic Metabolic Panel: Recent Labs  Lab 04/16/21 1046 04/17/21 0620  NA 143 141  K 3.8 4.4  CL 103 101  CO2 28 25  GLUCOSE 124* 126*  BUN 50* 56*  CREATININE 12.20* 13.12*  CALCIUM 9.2 8.9  MG 2.5* 2.4  PHOS  --  3.3   Liver Function Tests: Recent Labs  Lab 04/17/21 0620  AST 21  22  ALT 22  22  ALKPHOS 55  56  BILITOT 0.5  0.5  PROT 6.8  6.6  ALBUMIN 3.2*  3.3*   No results for input(s): LIPASE, AMYLASE in the last 168 hours. Recent Labs  Lab 04/16/21 2030  AMMONIA 59*   CBC: Recent Labs  Lab 04/16/21 1046 04/17/21 0620  WBC 4.0 4.9  NEUTROABS 2.8 4.1  HGB 6.5* 8.5*  HCT 21.6* 27.9*  MCV 101.9* 98.2  PLT 212 179   Cardiac Enzymes: No results for input(s): CKTOTAL, CKMB, CKMBINDEX, TROPONINI in the last 168 hours. BNP: Invalid input(s): POCBNP CBG: No results for input(s): GLUCAP in the last 168 hours. D-Dimer No results for input(s): DDIMER in  the last 72 hours. Hgb A1c No results for input(s): HGBA1C in the last 72 hours. Lipid Profile No results for input(s): CHOL, HDL, LDLCALC, TRIG, CHOLHDL, LDLDIRECT in the last 72 hours. Thyroid function studies Recent Labs    04/17/21 0620  TSH 0.458   Anemia work up Recent Labs    04/17/21 0620  VITAMINB12 483  FOLATE 7.2  FERRITIN 182  TIBC 231*  IRON 66  RETICCTPCT 4.0*   Urinalysis No results found for: COLORURINE, APPEARANCEUR, LABSPEC, [place], [place], [place], [place], KETONESUR, PROTEINUR, UROBILINOGEN, NITRITE, LEUKOCYTESUR Sepsis Labs Invalid input(s): PROCALCITONIN,  WBC,  LACTICIDVEN Microbiology Recent Results (from the past 240 hour(s))  SARS CORONAVIRUS 2 (TAT 6-24 HRS) Nasopharyngeal Nasopharyngeal Swab     Status: Abnormal   Collection Time: 04/16/21  7:06 PM   Specimen: Nasopharyngeal Swab  Result Value Ref Range Status   SARS Coronavirus 2 POSITIVE (A) NEGATIVE Final    Comment: (NOTE) SARS-CoV-2 target nucleic acids are DETECTED.  The SARS-CoV-2 RNA is generally detectable in upper and lower respiratory specimens during the acute phase of infection. Positive results are indicative of the presence of SARS-CoV-2 RNA. Clinical correlation with patient history and other diagnostic information is  necessary to determine patient infection status. Positive results do not rule out bacterial infection or co-infection with other viruses.  The expected result is Negative.  Fact Sheet for Patients: SugarRoll.be  Fact Sheet for Healthcare Providers: https://www.woods-mathews.com/  This test is not yet approved or cleared by the [place] FDA and  has been authorized for detection and/or diagnosis of SARS-CoV-2 by FDA under an Emergency Use Authorization (EUA). This EUA will remain  in effect (meaning this test can be used) for the duration of the COVID-19 declaration under Section 564(b)(1) of the Act, 21 U.  S.C. section 360bbb-3(b)(1), unless the authorization is terminated or revoked sooner.   Performed at [facility], [place]  [address], [place], [place] [address]      Time coordinating discharge: Over 30 minutes  SIGNED:   Charlynne Cousins, MD  [facility] 04/19/2021, 11:39 AM Pager   If 7PM-7AM, please contact night-coverage [email or web address] Password [email or web address]

## 2021-04-19 NOTE — Progress Notes (Signed)
Physical Therapy Treatment Patient Details Name: Anthony [name] MRN: [id number] DOB: 09/23/1940 Today's Date: 04/19/2021    History of Present Illness 80 y.o. male presenting to ED 7/26 with SOB after missing dialysis. Patient recently moved to [place] from [place] and is trying to get set up with PCP and dialysis center. Patient also reporting rectal bleeding. Patient admitted with symptomatic anemia and COPD with acute exacerbation. PMHx significant for ESRD on HD T,Th, Sat prior to move, anemia, vascular dementia, ETOH use disorder, anemia, CHF, COPD, CVA, GIB, HTN, A-fib, HTN, tobacco use disorder, and Hx of positive PPD.    PT Comments    Pt tolerated treatment well with decreased O2 from 4L-room air during session. VSS throughout (sats 91-98% on room air).  Pt mentioned having RW at home, but does not use it. Pt demonstrated brief LOB prior to and during ambulation and requires min A with descending stairs. Recommend HH for pt upon d/c, as pt experiences LOB with functional activity. Pt agreeable to updated d/c recommendation and use of RW when returning home.    Follow Up Recommendations  Home health PT     Equipment Recommendations  None recommended by PT    Recommendations for Other Services       Precautions / Restrictions Precautions Precautions: Fall Restrictions Weight Bearing Restrictions: No    Mobility  Bed Mobility Overal bed mobility: Independent             General bed mobility comments: In bed upon arrival. Decreased O2 to room air. Sats maintained >90% throughout session.    Transfers Overall transfer level: Needs assistance Equipment used: None Transfers: Sit to/from Stand Sit to Stand: Min guard         General transfer comment: Min guard for safety. Pt demonstrated brief LOB in standing  Ambulation/Gait Ambulation/Gait assistance: Min guard Gait Distance (Feet): 250 Feet Assistive device: None Gait Pattern/deviations: Step-through  pattern   Gait velocity interpretation: >2.62 ft/sec, indicative of community ambulatory General Gait Details: Demonstrated brief LOB prior to and following stair training. Min guard for safety   Stairs Stairs: Yes Stairs assistance: Min assist;Min guard Stair Management: One rail Left;Alternating pattern;Step to pattern Number of Stairs: 6 General stair comments: Alternating pattern ascending; step to pattern descending. Min A with pace and guidance when descending stairs. Min guard when ascending for safety.   Wheelchair Mobility    Modified Rankin (Stroke Patients Only)       Balance Overall balance assessment: Needs assistance Sitting-balance support: Feet supported;No upper extremity supported Sitting balance-Leahy Scale: Good     Standing balance support: During functional activity;No upper extremity supported Standing balance-Leahy Scale: Good Standing balance comment: Pt experienced brief LOB during functional activity                            Cognition Arousal/Alertness: Awake/alert Behavior During Therapy: WFL for tasks assessed/performed Overall Cognitive Status: Within Functional Limits for tasks assessed                                        Exercises      General Comments        Pertinent Vitals/Pain Pain Assessment: No/denies pain    Home Living                      Prior Function  PT Goals (current goals can now be found in the care plan section) Progress towards PT goals: Progressing toward goals    Frequency    Min 3X/week      PT Plan Current plan remains appropriate    Co-evaluation              AM-PAC PT "6 Clicks" Mobility   Outcome Measure  Help needed turning from your back to your side while in a flat bed without using bedrails?: None Help needed moving from lying on your back to sitting on the side of a flat bed without using bedrails?: None Help needed moving to  and from a bed to a chair (including a wheelchair)?: A Little Help needed standing up from a chair using your arms (e.g., wheelchair or bedside chair)?: A Little Help needed to walk in hospital room?: A Little Help needed climbing 3-5 steps with a railing? : A Little 6 Click Score: 20    End of Session Equipment Utilized During Treatment: Gait belt Activity Tolerance: Patient tolerated treatment well Patient left: in chair;with call bell/phone within reach;with chair alarm set Nurse Communication: Mobility status PT Visit Diagnosis: Unsteadiness on feet (R26.81);Other abnormalities of gait and mobility (R26.89)     Time: [id number] PT Time Calculation (min) (ACUTE ONLY): 18 min  Charges:  $Gait Training: 8-22 mins                     Louie Casa, SPT Acute Rehab: [phone]     Domingo Dimes 04/19/2021, 1:56 PM

## 2021-04-19 NOTE — Progress Notes (Addendum)
Progress Note    04/19/2021 8:05 AM 1 Day Post-Op  Subjective:  eating breakfast  Vitals:   04/18/21 2024 04/19/21 0324  BP: 132/77 (!) 150/70  Pulse: 66 60  Resp: 18 12  Temp: 97.6 F (36.4 C) 97.6 F (36.4 C)  SpO2: 98% 98%    Physical Exam: Aaox3 Bilateral radial artery pulses are palpable  CBC    Component Value Date/Time   WBC 4.9 04/17/2021 0620   RBC 2.84 (L) 04/17/2021 0620   RBC 2.74 (L) 04/17/2021 0620   HGB 8.5 (L) 04/17/2021 0620   HGB 7.7 (L) 03/20/2020 1539   HCT 27.9 (L) 04/17/2021 0620   HCT 24.5 (L) 03/20/2020 1539   PLT 179 04/17/2021 0620   PLT 195 03/20/2020 1539   MCV 98.2 04/17/2021 0620   MCV 89 03/20/2020 1539   MCH 29.9 04/17/2021 0620   MCHC 30.5 04/17/2021 0620   RDW 18.8 (H) 04/17/2021 0620   RDW 15.9 (H) 03/20/2020 1539   LYMPHSABS 0.6 (L) 04/17/2021 0620   MONOABS 0.2 04/17/2021 0620   EOSABS 0.0 04/17/2021 0620   BASOSABS 0.0 04/17/2021 0620    BMET    Component Value Date/Time   NA 141 04/17/2021 0620   K 4.4 04/17/2021 0620   CL 101 04/17/2021 0620   CO2 25 04/17/2021 0620   GLUCOSE 126 (H) 04/17/2021 0620   BUN 56 (H) 04/17/2021 0620   CREATININE 13.12 (H) 04/17/2021 0620   CALCIUM 8.9 04/17/2021 0620   GFRNONAA 4 (L) 04/17/2021 0620   GFRAA 4 (L) 03/02/2020 0758    INR No results found for: INR   Intake/Output Summary (Last 24 hours) at 04/19/2021 0805 Last data filed at 04/18/2021 1800 Gross per 24 hour  Intake 660 ml  Output --  Net 660 ml   A/P Vein map Summary: Right: RUE - SVT in basilic vein from origin to [facility] fossa. Unable  to visualize past AC fossa.   ESRD His veins are not acceptable for fistula he will need a graft. F/U will be arranged for OP surgery, ok to dc from vascular standpoint.   Roxy Horseman PA-C  [place] C. Donzetta Matters, MD [facility] [place] Office: [phone] Pager: [phone]  04/19/2021 8:05  AM   +-----------------+-------------+----------+--------------+  Right Cephalic   Diameter (cm)Depth (cm)   Findings     +-----------------+-------------+----------+--------------+  Shoulder             0.14        0.29                   +-----------------+-------------+----------+--------------+  Prox upper arm       0.09        0.25                   +-----------------+-------------+----------+--------------+  Mid upper arm        0.09        0.14                   +-----------------+-------------+----------+--------------+  Dist upper arm       0.11        0.24                   +-----------------+-------------+----------+--------------+  Antecubital fossa    0.08        0.10                   +-----------------+-------------+----------+--------------+  Prox forearm                            not visualized  +-----------------+-------------+----------+--------------+  Mid forearm                             not visualized  +-----------------+-------------+----------+--------------+  Dist forearm                            not visualized  +-----------------+-------------+----------+--------------+  Wrist                                   not visualized  +-----------------+-------------+----------+--------------+   +-----------------+-------------+----------+--------------+  Right Basilic    Diameter (cm)Depth (cm)   Findings     +-----------------+-------------+----------+--------------+  Prox upper arm       0.16                 thrombosed    +-----------------+-------------+----------+--------------+  Mid upper arm        0.18                 thrombosed    +-----------------+-------------+----------+--------------+  Dist upper arm       0.20                 thrombosed    +-----------------+-------------+----------+--------------+  Antecubital fossa    0.18                 thrombosed     +-----------------+-------------+----------+--------------+  Prox forearm                            not visualized  +-----------------+-------------+----------+--------------+  Mid forearm                             not visualized  +-----------------+-------------+----------+--------------+  Distal forearm                          not visualized  +-----------------+-------------+----------+--------------+  Elbow                                   not visualized  +-----------------+-------------+----------+--------------+  Wrist                                   not visualized  +-----------------+-------------+----------+--------------+

## 2021-04-19 NOTE — Progress Notes (Signed)
OT Cancellation Note  Patient Details Name: Khy Ryba MRN: [id number] DOB: November 01, 1940   Cancelled Treatment:    Reason Eval/Treat Not Completed: Patient at procedure or test/ unavailable; off floor for HD. OT to check back as time allows.   Gloris Manchester OTR/L Supplemental OT, Department of rehab services [phone]  Odette Watanabe R H. 04/19/2021, 7:41 AM

## 2021-04-19 NOTE — Progress Notes (Signed)
Out Patient Arrangements:  Continue to work on HD placement. Noted pt is covid positive. Waiting on HD clinic to see if he will need an isolation chair.   Linus Orn HPSS [phone]

## 2021-04-19 NOTE — Progress Notes (Signed)
[facility] Progress Note   80 y.o. male HTN Afib COPD CASHD ESRD TTS in [place] with last treatment prior to hospitalization on Sat. He moved before being accepted at any unit here in [place]. Recent COVID infection a month ago but mild and not treated; he has been vaccinated with booster as well. He denies dyspnea but has a intermittent nonproductive cough; he denies fever, chills, nausea, myalgias, diarrhea, abdominal pain or chest pain.   Dialysis orders: 4hr 2/2 bath 180 NRe RIJ TC EDW appears to be 65 kg based on review of past few treatments Calcitriol 1.5 mcg PO TIW Mircera 75 q2 weeks (last 7/19)  Assessment/ Plan:   ESRD - moved here from [place] prior to being accepted at a unit. Last HD was on Saturday. - Seen on HD him today  RIJ TC 161/77  2K bath goal net UF 3L Currently tolerating with no complaints  Next HD Sat given missed treatments (will be able to complete 2 treatments this week)   - Notified renal navigator for placement. - Restrict right arm for future dialysis access; vein mapping done and will need RUA AVG per VVS as an outpatient. Appreciate [facility] seeing the patient so promptly.  Renal osteodystrophy - Will phos for management but restart Phoslo 3 tabs TIDM; he's had issues with compliance in the past but Phos 7/27 was 3.3.  Will recheck phos on Mon. Anemia - Last Mircera given 7/19) - Single  dose of Feraheme given on 7/27. HTN - restart home meds and will adjust as needed + UF on HD. COVID a month ago - currently asymptomatic except mild nonproductive cough; was not treated but he has been vaccinated + booster. CASHD -> last cath 03/04/21 followed by cards in [place] COPD Afib not an anticoag bec of fall risk and h/o bleeding; being rate controlled.  Subjective:   Feeling well but c/o back being cold. Denies f/c/n/v/dsypnea.   Objective:   BP (!) (P) 161/77   Pulse (!) (P) 57   Temp (!) 97.1 F (36.2 C) (Axillary)   Resp  (P) 12   Ht '5\' 7"'$  (1.702 m)   Wt 68 kg   SpO2 (P) 100%   BMI 23.48 kg/m   Intake/Output Summary (Last 24 hours) at 04/19/2021 0913 Last data filed at 04/18/2021 1800 Gross per 24 hour  Intake 660 ml  Output --  Net 660 ml   Weight change: -0.9 kg  Physical Exam: GEN: NAD, A&Ox3, NCAT HEENT: No conjunctival pallor, EOMI NECK: Supple, no thyromegaly LUNGS: CTA B/L no rales, rhonchi or wheezing CV: RRR, No M/R/G ABD: SNDNT +BS  EXT: No lower extremity edema ACCESS: RIJ TC   Imaging: ECHOCARDIOGRAM COMPLETE  Result Date: 04/17/2021    ECHOCARDIOGRAM REPORT   Patient Name:   BRANDTLEY LINSON Date of Exam: 04/17/2021 Medical Rec #:  [id number]      Height:       66.5 in Accession #:    [id number]     Weight:       137.4 lb Date of Birth:  01-Feb-1941       BSA:          1.714 m Patient Age:    39 years       BP:           171/101 mmHg Patient Gender: M              HR:  65 bpm. Exam Location:  Inpatient Procedure: 2D Echo, 3D Echo, Color Doppler, Cardiac Doppler and Strain Analysis Indications:    R06.9 DOE  History:        Patient has prior history of Echocardiogram examinations, most                 recent 03/01/2020. CAD, COPD, Arrythmias:Atrial Fibrillation;                 Risk Factors:Hypertension and Dyslipidemia.  Sonographer:    Raquel Sarna Senior RDCS Referring Phys: [place]  Sonographer Comments: Poor parasternal window due to COPD IMPRESSIONS  1. Left ventricular ejection fraction, by estimation, is 50 to 55%. The left ventricle has low normal function. The left ventricle has no regional wall motion abnormalities. There is mild left ventricular hypertrophy. Left ventricular diastolic parameters are consistent with Grade II diastolic dysfunction (pseudonormalization). Elevated left atrial pressure.  2. Right ventricular systolic function is normal. The right ventricular size is normal. There is normal pulmonary artery systolic pressure. The estimated right ventricular  systolic pressure is 0000000 mmHg.  3. Left atrial size was moderately dilated.  4. The mitral valve is degenerative. Trivial mitral valve regurgitation. Mild mitral stenosis. The mean mitral valve gradient is 4.0 mmHg at HR 65bpm. MVA 1.9 cm^2 by coninuity equation.  5. The aortic valve was not well visualized. Aortic valve regurgitation is not visualized. No aortic stenosis is present.  6. The inferior vena cava is normal in size with <50% respiratory variability, suggesting right atrial pressure of 8 mmHg. FINDINGS  Left Ventricle: Left ventricular ejection fraction, by estimation, is 50 to 55%. The left ventricle has low normal function. The left ventricle has no regional wall motion abnormalities. The left ventricular internal cavity size was normal in size. There is mild left ventricular hypertrophy. Left ventricular diastolic parameters are consistent with Grade II diastolic dysfunction (pseudonormalization). Elevated left atrial pressure. Right Ventricle: The right ventricular size is normal. No increase in right ventricular wall thickness. Right ventricular systolic function is normal. There is normal pulmonary artery systolic pressure. The tricuspid regurgitant velocity is 2.17 m/s, and  with an assumed right atrial pressure of 8 mmHg, the estimated right ventricular systolic pressure is 0000000 mmHg. Left Atrium: Left atrial size was moderately dilated. Right Atrium: Right atrial size was normal in size. Pericardium: There is no evidence of pericardial effusion. Mitral Valve: The mitral valve is degenerative in appearance. Trivial mitral valve regurgitation. Mild mitral valve stenosis. MV peak gradient, 7.1 mmHg. The mean mitral valve gradient is 4.0 mmHg. Tricuspid Valve: The tricuspid valve is normal in structure. Tricuspid valve regurgitation is trivial. Aortic Valve: The aortic valve was not well visualized. Aortic valve regurgitation is not visualized. No aortic stenosis is present. Pulmonic Valve: The  pulmonic valve was not well visualized. Pulmonic valve regurgitation is trivial. Aorta: The aortic root is normal in size and structure. Venous: The inferior vena cava is normal in size with less than 50% respiratory variability, suggesting right atrial pressure of 8 mmHg. IAS/Shunts: The interatrial septum was not well visualized.  LEFT VENTRICLE PLAX 2D LVIDd:         5.00 cm  Diastology LVIDs:         3.80 cm  LV e' medial:    3.81 cm/s LV PW:         1.10 cm  LV E/e' medial:  34.6 LV IVS:        1.10 cm  LV e' lateral:  6.20 cm/s LVOT diam:     2.00 cm  LV E/e' lateral: 21.3 LV SV:         80 LV SV Index:   47 LVOT Area:     3.14 cm                          3D Volume EF:                         3D EF:        48 %                         LV EDV:       181 ml                         LV ESV:       94 ml                         LV SV:        87 ml RIGHT VENTRICLE RV S prime:     13.80 cm/s TAPSE (M-mode): 2.4 cm LEFT ATRIUM             Index       RIGHT ATRIUM           Index LA diam:        4.90 cm 2.86 cm/m  RA Area:     15.80 cm LA Vol (A2C):   81.2 ml 47.36 ml/m RA Volume:   36.90 ml  21.52 ml/m LA Vol (A4C):   62.5 ml 36.45 ml/m LA Biplane Vol: 75.5 ml 44.04 ml/m  AORTIC VALVE LVOT Vmax:   117.00 cm/s LVOT Vmean:  67.100 cm/s LVOT VTI:    0.254 m  AORTA Ao Root diam: 3.10 cm MITRAL VALVE                TRICUSPID VALVE MV Area (PHT): 3.40 cm     TR Peak grad:   18.8 mmHg MV Area VTI:   1.88 cm     TR Vmax:        217.00 cm/s MV Peak grad:  7.1 mmHg MV Mean grad:  4.0 mmHg     SHUNTS MV Vmax:       1.33 m/s     Systemic VTI:  0.25 m MV Vmean:      101.0 cm/s   Systemic Diam: 2.00 cm MV Decel Time: 223 msec MV E velocity: 132.00 cm/s MV A velocity: 137.00 cm/s MV E/A ratio:  0.96 Oswaldo Milian MD Electronically signed by Oswaldo Milian MD Signature Date/Time: 04/17/2021/12:10:49 PM    Final    VAS [place] UPPER EXT VEIN MAPPING (PRE-OP AVF)  Result Date: 04/19/2021 [place] MAPPING  Patient Name:  APOLINAR GUIRGUIS  Date of Exam:   04/18/2021 Medical Rec #: [id number]       Accession #:    [id number] Date of Birth: 16-Mar-1941        Patient Gender: M Patient Age:   79Y Exam Location:  [facility] Procedure:      VAS [place] UPPER EXT VEIN MAPPING (PRE-OP AVF) Referring Phys: [id number] Hunt Oris Mirha Brucato --------------------------------------------------------------------------------  Indications: Pre-access. Performing Technologist: Rogelia Rohrer RVT, RDMS  Examination Guidelines: A complete evaluation includes B-mode imaging, spectral Doppler, color Doppler,  and power Doppler as needed of all accessible portions of each vessel. Bilateral testing is considered an integral part of a complete examination. Limited examinations for reoccurring indications may be performed as noted. +-----------------+-------------+----------+--------------+ Right Cephalic   Diameter (cm)Depth (cm)   Findings    +-----------------+-------------+----------+--------------+ Shoulder             0.14        0.29                  +-----------------+-------------+----------+--------------+ Prox upper arm       0.09        0.25                  +-----------------+-------------+----------+--------------+ Mid upper arm        0.09        0.14                  +-----------------+-------------+----------+--------------+ Dist upper arm       0.11        0.24                  +-----------------+-------------+----------+--------------+ Antecubital fossa    0.08        0.10                  +-----------------+-------------+----------+--------------+ Prox forearm                            not visualized +-----------------+-------------+----------+--------------+ Mid forearm                             not visualized +-----------------+-------------+----------+--------------+ Dist forearm                            not visualized +-----------------+-------------+----------+--------------+ Wrist                                    not visualized +-----------------+-------------+----------+--------------+ +-----------------+-------------+----------+--------------+ Right Basilic    Diameter (cm)Depth (cm)   Findings    +-----------------+-------------+----------+--------------+ Prox upper arm       0.16                 thrombosed   +-----------------+-------------+----------+--------------+ Mid upper arm        0.18                 thrombosed   +-----------------+-------------+----------+--------------+ Dist upper arm       0.20                 thrombosed   +-----------------+-------------+----------+--------------+ Antecubital fossa    0.18                 thrombosed   +-----------------+-------------+----------+--------------+ Prox forearm                            not visualized +-----------------+-------------+----------+--------------+ Mid forearm                             not visualized +-----------------+-------------+----------+--------------+ Distal forearm                          not visualized +-----------------+-------------+----------+--------------+ Elbow  not visualized +-----------------+-------------+----------+--------------+ Wrist                                   not visualized +-----------------+-------------+----------+--------------+ Summary: Right: RUE - SVT in basilic vein from origin to [facility] fossa. Unable to        visualize past AC fossa.  *See table(s) above for measurements and observations.  Diagnosing physician: Deitra Mayo MD Electronically signed by Deitra Mayo MD on 04/19/2021 at 7:54:07 AM.    Final     Labs: BMET Recent Labs  Lab 04/16/21 1046 04/17/21 0620  NA 143 141  K 3.8 4.4  CL 103 101  CO2 28 25  GLUCOSE 124* 126*  BUN 50* 56*  CREATININE 12.20* 13.12*  CALCIUM 9.2 8.9  PHOS  --  3.3   CBC Recent Labs  Lab 04/16/21 1046 04/17/21 0620  WBC 4.0 4.9   NEUTROABS 2.8 4.1  HGB 6.5* 8.5*  HCT 21.6* 27.9*  MCV 101.9* 98.2  PLT 212 179    Medications:     sodium chloride   Intravenous Once   amLODipine  10 mg Oral Daily   atorvastatin  20 mg Oral Daily   calcium acetate  2,001 mg Oral TID WC   carvedilol  6.25 mg Oral BID WC   Chlorhexidine Gluconate Cloth  6 each Topical Q0600   doxycycline  100 mg Oral Q12H   feeding supplement (NEPRO CARB STEADY)  237 mL Oral Q24H   hydrALAZINE  100 mg Oral BID   ipratropium-albuterol  3 mL Nebulization Q6H   isosorbide mononitrate  30 mg Oral Daily   ketotifen  1 drop Both Eyes BID   melatonin  3 mg Oral QHS   memantine  5 mg Oral Daily   mometasone-formoterol  2 puff Inhalation BID   multivitamin  1 tablet Oral QHS   pantoprazole  40 mg Oral Q0600   predniSONE  20 mg Oral Q breakfast   umeclidinium bromide  1 puff Inhalation Daily      Otelia Santee, MD 04/19/2021, 9:13 AM

## 2021-04-19 NOTE — Progress Notes (Signed)
PT Cancellation Note  Patient Details Name: Anthony [name] MRN: [id number] DOB: 10/21/1940   Cancelled Treatment:    Reason Eval/Treat Not Completed: Patient at procedure or test/unavailable (Pt in hemodialysis.  Will check back as able.)   [name] 04/19/2021, 9:25 AM [name],PT Acute Rehab Services [phone] (pager)

## 2021-04-20 DIAGNOSIS — D649 Anemia, unspecified: Secondary | ICD-10-CM | POA: Diagnosis not present

## 2021-04-20 LAB — RENAL FUNCTION PANEL
Albumin: 3.4 g/dL — ABNORMAL LOW (ref 3.5–5.0)
Anion gap: 13 (ref 5–15)
BUN: 54 mg/dL — ABNORMAL HIGH (ref 8–23)
CO2: 24 mmol/L (ref 22–32)
Calcium: 9 mg/dL (ref 8.9–10.3)
Chloride: 100 mmol/L (ref 98–111)
Creatinine, Ser: 8.93 mg/dL — ABNORMAL HIGH (ref 0.61–1.24)
GFR, Estimated: 6 mL/min — ABNORMAL LOW (ref >60)
Glucose, Bld: 138 mg/dL — ABNORMAL HIGH (ref 70–99)
Phosphorus: 2.4 mg/dL — ABNORMAL LOW (ref 2.5–4.6)
Potassium: 3.8 mmol/L (ref 3.5–5.1)
Sodium: 137 mmol/L (ref 135–145)

## 2021-04-20 LAB — CBC
HCT: 32.7 % — ABNORMAL LOW (ref 39.0–52.0)
Hemoglobin: 10.6 g/dL — ABNORMAL LOW (ref 13.0–17.0)
MCH: 30.6 pg (ref 26.0–34.0)
MCHC: 32.4 g/dL (ref 30.0–36.0)
MCV: 94.5 fL (ref 80.0–100.0)
Platelets: 232 10*3/uL (ref 150–400)
RBC: 3.46 MIL/uL — ABNORMAL LOW (ref 4.22–5.81)
RDW: 18.4 % — ABNORMAL HIGH (ref 11.5–15.5)
WBC: 9.7 10*3/uL (ref 4.0–10.5)
nRBC: 0.8 % — ABNORMAL HIGH (ref 0.0–0.2)

## 2021-04-20 MED ORDER — PREDNISONE 10 MG PO TABS
10.0000 mg | ORAL_TABLET | Freq: Every day | ORAL | Status: AC
  Administered 2021-04-20: 10 mg via ORAL
  Filled 2021-04-20: qty 1

## 2021-04-20 NOTE — Progress Notes (Signed)
[facility] Progress Note   80 y.o. male HTN Afib COPD CASHD ESRD TTS in [place] with last treatment prior to hospitalization on Sat. He moved before being accepted at any unit here in [place]. Recent COVID infection a month ago but mild and not treated; he has been vaccinated with booster as well. He denies dyspnea but has a intermittent nonproductive cough; he denies fever, chills, nausea, myalgias, diarrhea, abdominal pain or chest pain.   Dialysis orders: 4hr 2/2 bath 180 NRe RIJ TC EDW appears to be 65 kg based on review of past few treatments Calcitriol 1.5 mcg PO TIW Mircera 75 q2 weeks (last 7/19)  Assessment/ Plan:   ESRD - moved here from [place] prior to being accepted at a unit. Last HD was on Saturday. HD 7/29 with 2.9 L net UF  Next HD today given missed treatments (will be able to complete 2 treatments this week)  and then TTS next week.  - Notified renal navigator for placement. - Restrict right arm for future dialysis access; vein mapping done and will need RUA AVG per VVS as an outpatient. Appreciate [facility] seeing the patient so promptly.  Renal osteodystrophy - Will phos for management but restart Phoslo 3 tabs TIDM; he's had issues with compliance in the past but Phos 7/27 was 3.3.  Will recheck phos on Mon. Anemia - Last Mircera given 7/19) - Single  dose of Feraheme given on 7/27. HTN - restart home meds and will adjust as needed + UF on HD. COVID a month ago - currently asymptomatic except mild nonproductive cough; was not treated but he has been vaccinated + booster. CASHD -> last cath 03/04/21 followed by cards in [place] COPD Afib not an anticoag bec of fall risk and h/o bleeding; being rate controlled.  Subjective:   Feeling well Denies f/c/n/v/dsypnea; was wondering why he had to dialyze today and I explained he has missed many treatments and with today's tx will still only have 2 txs this week..   Objective:   BP 130/71 (BP  Location: Right Arm)   Pulse 75   Temp 98.5 F (36.9 C) (Oral)   Resp 16   Ht '5\' 7"'$  (1.702 m)   Wt 65.1 kg   SpO2 94%   BMI 22.48 kg/m   Intake/Output Summary (Last 24 hours) at 04/20/2021 0829 Last data filed at 04/19/2021 1800 Gross per 24 hour  Intake 360 ml  Output 2920 ml  Net -2560 ml   Weight change: 0.7 kg  Physical Exam: GEN: NAD, A&Ox3, NCAT HEENT: No conjunctival pallor, EOMI NECK: Supple, no thyromegaly LUNGS: CTA B/L no rales, rhonchi or wheezing CV: RRR, No M/R/G ABD: SNDNT +BS  EXT: No lower extremity edema ACCESS: RIJ TC   Imaging: VAS [place] UPPER EXT VEIN MAPPING (PRE-OP AVF)  Result Date: 04/19/2021 [place] Patient Name:  Anthony [name]  Date of Exam:   04/18/2021 Medical Rec #: [id number]       Accession #:    [id number] Date of Birth: November 09, 1940        Patient Gender: M Patient Age:   79Y Exam Location:  [facility] Procedure:      VAS [place] UPPER EXT VEIN MAPPING (PRE-OP AVF) Referring Phys: [id number] Hunt Oris Kazoua Gossen --------------------------------------------------------------------------------  Indications: Pre-access. Performing Technologist: Rogelia Rohrer RVT, RDMS  Examination Guidelines: A complete evaluation includes B-mode imaging, spectral Doppler, color Doppler, and power Doppler as needed of all accessible portions of each vessel. Bilateral  testing is considered an integral part of a complete examination. Limited examinations for reoccurring indications may be performed as noted. +-----------------+-------------+----------+--------------+ Right Cephalic   Diameter (cm)Depth (cm)   Findings    +-----------------+-------------+----------+--------------+ Shoulder             0.14        0.29                  +-----------------+-------------+----------+--------------+ Prox upper arm       0.09        0.25                  +-----------------+-------------+----------+--------------+ Mid upper arm        0.09        0.14                   +-----------------+-------------+----------+--------------+ Dist upper arm       0.11        0.24                  +-----------------+-------------+----------+--------------+ Antecubital fossa    0.08        0.10                  +-----------------+-------------+----------+--------------+ Prox forearm                            not visualized +-----------------+-------------+----------+--------------+ Mid forearm                             not visualized +-----------------+-------------+----------+--------------+ Dist forearm                            not visualized +-----------------+-------------+----------+--------------+ Wrist                                   not visualized +-----------------+-------------+----------+--------------+ +-----------------+-------------+----------+--------------+ Right Basilic    Diameter (cm)Depth (cm)   Findings    +-----------------+-------------+----------+--------------+ Prox upper arm       0.16                 thrombosed   +-----------------+-------------+----------+--------------+ Mid upper arm        0.18                 thrombosed   +-----------------+-------------+----------+--------------+ Dist upper arm       0.20                 thrombosed   +-----------------+-------------+----------+--------------+ Antecubital fossa    0.18                 thrombosed   +-----------------+-------------+----------+--------------+ Prox forearm                            not visualized +-----------------+-------------+----------+--------------+ Mid forearm                             not visualized +-----------------+-------------+----------+--------------+ Distal forearm                          not visualized +-----------------+-------------+----------+--------------+ Elbow  not visualized +-----------------+-------------+----------+--------------+ Wrist                                    not visualized +-----------------+-------------+----------+--------------+ Summary: Right: RUE - SVT in basilic vein from origin to [facility] fossa. Unable to        visualize past AC fossa.  *See table(s) above for measurements and observations.  Diagnosing physician: Deitra Mayo MD Electronically signed by Deitra Mayo MD on 04/19/2021 at 7:54:07 AM.    Final     Labs: BMET Recent Labs  Lab 04/16/21 1046 04/17/21 0620  NA 143 141  K 3.8 4.4  CL 103 101  CO2 28 25  GLUCOSE 124* 126*  BUN 50* 56*  CREATININE 12.20* 13.12*  CALCIUM 9.2 8.9  PHOS  --  3.3   CBC Recent Labs  Lab 04/16/21 1046 04/17/21 0620  WBC 4.0 4.9  NEUTROABS 2.8 4.1  HGB 6.5* 8.5*  HCT 21.6* 27.9*  MCV 101.9* 98.2  PLT 212 179    Medications:     sodium chloride   Intravenous Once   amLODipine  10 mg Oral Daily   atorvastatin  20 mg Oral Daily   calcium acetate  2,001 mg Oral TID WC   carvedilol  6.25 mg Oral BID WC   Chlorhexidine Gluconate Cloth  6 each Topical Q0600   feeding supplement (NEPRO CARB STEADY)  237 mL Oral Q24H   hydrALAZINE  100 mg Oral BID   ipratropium-albuterol  3 mL Nebulization Q6H   isosorbide mononitrate  30 mg Oral Daily   ketotifen  1 drop Both Eyes BID   melatonin  3 mg Oral QHS   memantine  5 mg Oral Daily   mometasone-formoterol  2 puff Inhalation BID   multivitamin  1 tablet Oral QHS   pantoprazole  40 mg Oral Q0600   predniSONE  10 mg Oral Q breakfast   umeclidinium bromide  1 puff Inhalation Daily      Otelia Santee, MD 04/20/2021, 8:29 AM

## 2021-04-20 NOTE — Progress Notes (Signed)
[facility] PROGRESS NOTE    Progress Note  Anthony [name]  [id number] DOB: 01-23-1941 DOA: 04/16/2021 PCP: Isaac Bliss, Rayford Halsted, MD     Brief Narrative:   Anthony [name] is an 80 y.o. male past medical history of essential hypertension, atrial fibrillation not on anticoagulation due to fall risk and history of bleeding, end-stage renal disease at [facility], vascular dementia, alcohol abuse, recent COVID infection but has been vaccinated and boosted.  Presented to the ED after being sent from dialysis as her hemoglobin was found to have a hemoglobin of 6.5, FOBT was positive.    Assessment/Plan:   Symptomatic anemia with a positive FOBT/ABLA 2/2 to angiodisplasia: He status post 2 units of packed red blood cells her hemoglobin this morning is 8.5.  Only on Protonix twice a day. GI was consulted to perform endoscopy they found 2 nonbleeding angiodysplastic lesions treated with argon plasma. Continue to hold aspirin patient is stable awaiting placement to dialysis center. Physical therapy evaluated the patient recommended skilled nursing facility.  COPD with acute exacerbation: Improve saturations satting greater 90%. Completed his course of antibiotics in house continue wean down steroids.  Positive serum QuantiFERON: The patient is asymptomatic, chest x-ray showed mild basilar atelectasis versus scarring. Discussed with ID he is currently asymptomatic they will see medicine outpatient.  End-stage renal disease: Nephrology has been consulted for HD today.  Tobacco abuse: She refused nicotine patch.  Paroxysmal atrial fibrillation: Rate controlled on Coreg not on anticoagulation due to fall risk and GI bleed.  Essential hypertension: Improved today continue current regimen.   Chronic diastolic heart failure: Resume Coreg and hydralazine fluid status per renal.  CAD: Hold aspirin Hemoccult positive.  Hyperlipidemia: Excellent continue  statins.  Prolonged QTC: Monitor.  Dementia without behavioral disturbances: At risk of aspiration and acute confusional state melatonin at night Seroquel at bedtime as needed.   DVT prophylaxis: lovenox Family Communication:wife Status is: Inpatient  Remains inpatient appropriate because:Hemodynamically unstable  Dispo: The patient is from: Home              Anticipated d/c is to: Home              Patient currently is not medically stable to d/c.   Difficult to place patient No        Code Status:     Code Status Orders  (From admission, onward)           Start     Ordered   04/16/21 1930  Full code  Continuous        04/16/21 1934           Code Status History     Date Active Date Inactive Code Status Order ID Comments User Context   02/29/2020 1647 03/02/2020 2311 Full Code [id number]  Mckinley Jewel, MD ED         IV Access:   Peripheral IV   Procedures and diagnostic studies:   VAS [place] UPPER EXT VEIN MAPPING (PRE-OP AVF)  Result Date: 04/19/2021 [place] Patient Name:  Anthony [name]  Date of Exam:   04/18/2021 Medical Rec #: [id number]       Accession #:    [id number] Date of Birth: September 02, 1941        Patient Gender: M Patient Age:   79Y Exam Location[facility]  [facility] Procedure:      VAS [place] UPPER EXT VEIN MAPPING (PRE-OP AVF) Referring Phys: [id number] Hunt Oris LIN --------------------------------------------------------------------------------  Indications: Pre-access. Performing Technologist: Rogelia Rohrer RVT, RDMS  Examination Guidelines: A complete evaluation includes B-mode imaging, spectral Doppler, color Doppler, and power Doppler as needed of all accessible portions of each vessel. Bilateral testing is considered an integral part of a complete examination. Limited examinations for reoccurring indications may be performed as noted. +-----------------+-------------+----------+--------------+ Right Cephalic   Diameter (cm)Depth  (cm)   Findings    +-----------------+-------------+----------+--------------+ Shoulder             0.14        0.29                  +-----------------+-------------+----------+--------------+ Prox upper arm       0.09        0.25                  +-----------------+-------------+----------+--------------+ Mid upper arm        0.09        0.14                  +-----------------+-------------+----------+--------------+ Dist upper arm       0.11        0.24                  +-----------------+-------------+----------+--------------+ Antecubital fossa    0.08        0.10                  +-----------------+-------------+----------+--------------+ Prox forearm                            not visualized +-----------------+-------------+----------+--------------+ Mid forearm                             not visualized +-----------------+-------------+----------+--------------+ Dist forearm                            not visualized +-----------------+-------------+----------+--------------+ Wrist                                   not visualized +-----------------+-------------+----------+--------------+ +-----------------+-------------+----------+--------------+ Right Basilic    Diameter (cm)Depth (cm)   Findings    +-----------------+-------------+----------+--------------+ Prox upper arm       0.16                 thrombosed   +-----------------+-------------+----------+--------------+ Mid upper arm        0.18                 thrombosed   +-----------------+-------------+----------+--------------+ Dist upper arm       0.20                 thrombosed   +-----------------+-------------+----------+--------------+ Antecubital fossa    0.18                 thrombosed   +-----------------+-------------+----------+--------------+ Prox forearm                            not visualized +-----------------+-------------+----------+--------------+ Mid  forearm                             not visualized +-----------------+-------------+----------+--------------+ Distal forearm  not visualized +-----------------+-------------+----------+--------------+ Elbow                                   not visualized +-----------------+-------------+----------+--------------+ Wrist                                   not visualized +-----------------+-------------+----------+--------------+ Summary: Right: RUE - SVT in basilic vein from origin to [facility] fossa. Unable to        visualize past AC fossa.  *See table(s) above for measurements and observations.  Diagnosing physician: Deitra Mayo MD Electronically signed by Deitra Mayo MD on 04/19/2021 at 7:54:07 AM.    Final      Medical Consultants:   None.   Subjective:    Anthony [name] no complaints.  Objective:    Vitals:   04/19/21 1455 04/19/21 1800 04/19/21 2125 04/20/21 0614  BP:   112/60 130/71  Pulse:  75    Resp:  16    Temp:   97.7 F (36.5 C) 98.5 F (36.9 C)  TempSrc:   Oral Oral  SpO2: 100% 94%    Weight:      Height:       SpO2: 94 % O2 Flow Rate (L/min): 2 L/min   Intake/Output Summary (Last 24 hours) at 04/20/2021 0755 Last data filed at 04/19/2021 1800 Gross per 24 hour  Intake 360 ml  Output 2920 ml  Net -2560 ml    Filed Weights   04/18/21 0900 04/19/21 0745 04/19/21 1138  Weight: 67.3 kg 68 kg 65.1 kg    Exam: General exam: In no acute distress. Respiratory system: Good air movement and clear to auscultation. Cardiovascular system: S1 & S2 heard, RRR. No JVD. Gastrointestinal system: Abdomen is nondistended, soft and nontender.  Extremities: No pedal edema. Skin: No rashes, lesions or ulcers  Data Reviewed:    Labs: Basic Metabolic Panel: Recent Labs  Lab 04/16/21 1046 04/17/21 0620  NA 143 141  K 3.8 4.4  CL 103 101  CO2 28 25  GLUCOSE 124* 126*  BUN 50* 56*  CREATININE 12.20* 13.12*   CALCIUM 9.2 8.9  MG 2.5* 2.4  PHOS  --  3.3    GFR Estimated Creatinine Clearance: 4.3 mL/min (A) (by C-G formula based on SCr of 13.12 mg/dL (H)). Liver Function Tests: Recent Labs  Lab 04/17/21 0620  AST 21  22  ALT 22  22  ALKPHOS 55  56  BILITOT 0.5  0.5  PROT 6.8  6.6  ALBUMIN 3.2*  3.3*    No results for input(s): LIPASE, AMYLASE in the last 168 hours. Recent Labs  Lab 04/16/21 2030  AMMONIA 59*    Coagulation profile No results for input(s): INR, PROTIME in the last 168 hours. COVID-19 Labs  No results for input(s): DDIMER, FERRITIN, LDH, CRP in the last 72 hours.   Lab Results  Component Value Date   SARSCOV2NAA POSITIVE (A) 04/16/2021   [place] NEGATIVE 02/29/2020    CBC: Recent Labs  Lab 04/16/21 1046 04/17/21 0620  WBC 4.0 4.9  NEUTROABS 2.8 4.1  HGB 6.5* 8.5*  HCT 21.6* 27.9*  MCV 101.9* 98.2  PLT 212 179    Cardiac Enzymes: No results for input(s): CKTOTAL, CKMB, CKMBINDEX, TROPONINI in the last 168 hours. BNP (last 3 results) No results for input(s): PROBNP in the last 8760 hours. CBG: No results  for input(s): GLUCAP in the last 168 hours. D-Dimer: No results for input(s): DDIMER in the last 72 hours. Hgb A1c: No results for input(s): HGBA1C in the last 72 hours. Lipid Profile: No results for input(s): CHOL, HDL, LDLCALC, TRIG, CHOLHDL, LDLDIRECT in the last 72 hours. Thyroid function studies: No results for input(s): TSH, T4TOTAL, T3FREE, THYROIDAB in the last 72 hours.  Invalid input(s): FREET3  Anemia work up: No results for input(s): VITAMINB12, FOLATE, FERRITIN, TIBC, IRON, RETICCTPCT in the last 72 hours.  Sepsis Labs: Recent Labs  Lab 04/16/21 1046 04/17/21 0620  WBC 4.0 4.9    Microbiology Recent Results (from the past 240 hour(s))  SARS CORONAVIRUS 2 (TAT 6-24 HRS) Nasopharyngeal Nasopharyngeal Swab     Status: Abnormal   Collection Time: 04/16/21  7:06 PM   Specimen: Nasopharyngeal Swab  Result  Value Ref Range Status   SARS Coronavirus 2 POSITIVE (A) NEGATIVE Final    Comment: (NOTE) SARS-CoV-2 target nucleic acids are DETECTED.  The SARS-CoV-2 RNA is generally detectable in upper and lower respiratory specimens during the acute phase of infection. Positive results are indicative of the presence of SARS-CoV-2 RNA. Clinical correlation with patient history and other diagnostic information is  necessary to determine patient infection status. Positive results do not rule out bacterial infection or co-infection with other viruses.  The expected result is Negative.  Fact Sheet for Patients: SugarRoll.be  Fact Sheet for Healthcare Providers: https://www.woods-mathews.com/  This test is not yet approved or cleared by the [place] FDA and  has been authorized for detection and/or diagnosis of SARS-CoV-2 by FDA under an Emergency Use Authorization (EUA). This EUA will remain  in effect (meaning this test can be used) for the duration of the COVID-19 declaration under Section 564(b)(1) of the Act, 21 U. S.C. section 360bbb-3(b)(1), unless the authorization is terminated or revoked sooner.   Performed at [facility], [place] [address], [place], [place] [address]      Medications:    sodium chloride   Intravenous Once   amLODipine  10 mg Oral Daily   atorvastatin  20 mg Oral Daily   calcium acetate  2,001 mg Oral TID WC   carvedilol  6.25 mg Oral BID WC   Chlorhexidine Gluconate Cloth  6 each Topical Q0600   doxycycline  100 mg Oral Q12H   feeding supplement (NEPRO CARB STEADY)  237 mL Oral Q24H   hydrALAZINE  100 mg Oral BID   ipratropium-albuterol  3 mL Nebulization Q6H   isosorbide mononitrate  30 mg Oral Daily   ketotifen  1 drop Both Eyes BID   melatonin  3 mg Oral QHS   memantine  5 mg Oral Daily   mometasone-formoterol  2 puff Inhalation BID   multivitamin  1 tablet Oral QHS   pantoprazole  40 mg Oral Q0600    predniSONE  20 mg Oral Q breakfast   umeclidinium bromide  1 puff Inhalation Daily   Continuous Infusions:  sodium chloride        LOS: 4 days   Anthony [name]  [facility]  04/20/2021, 7:55 AM

## 2021-04-21 DIAGNOSIS — D649 Anemia, unspecified: Secondary | ICD-10-CM | POA: Diagnosis not present

## 2021-04-21 LAB — RENAL FUNCTION PANEL
Albumin: 3.3 g/dL — ABNORMAL LOW (ref 3.5–5.0)
Anion gap: 9 (ref 5–15)
BUN: 31 mg/dL — ABNORMAL HIGH (ref 8–23)
CO2: 27 mmol/L (ref 22–32)
Calcium: 8.4 mg/dL — ABNORMAL LOW (ref 8.9–10.3)
Chloride: 100 mmol/L (ref 98–111)
Creatinine, Ser: 5.75 mg/dL — ABNORMAL HIGH (ref 0.61–1.24)
GFR, Estimated: 10 mL/min — ABNORMAL LOW (ref >60)
Glucose, Bld: 101 mg/dL — ABNORMAL HIGH (ref 70–99)
Phosphorus: 1.9 mg/dL — ABNORMAL LOW (ref 2.5–4.6)
Potassium: 3.4 mmol/L — ABNORMAL LOW (ref 3.5–5.1)
Sodium: 136 mmol/L (ref 135–145)

## 2021-04-21 LAB — CBC
HCT: 34.4 % — ABNORMAL LOW (ref 39.0–52.0)
Hemoglobin: 11.2 g/dL — ABNORMAL LOW (ref 13.0–17.0)
MCH: 30.9 pg (ref 26.0–34.0)
MCHC: 32.6 g/dL (ref 30.0–36.0)
MCV: 95 fL (ref 80.0–100.0)
Platelets: 205 10*3/uL (ref 150–400)
RBC: 3.62 MIL/uL — ABNORMAL LOW (ref 4.22–5.81)
RDW: 18.2 % — ABNORMAL HIGH (ref 11.5–15.5)
WBC: 9.3 10*3/uL (ref 4.0–10.5)
nRBC: 0.8 % — ABNORMAL HIGH (ref 0.0–0.2)

## 2021-04-21 MED ORDER — IPRATROPIUM-ALBUTEROL 0.5-2.5 (3) MG/3ML IN SOLN
3.0000 mL | Freq: Two times a day (BID) | RESPIRATORY_TRACT | Status: DC
  Administered 2021-04-21 – 2021-04-22 (×3): 3 mL via RESPIRATORY_TRACT
  Filled 2021-04-21 (×3): qty 3

## 2021-04-21 MED ORDER — HEPARIN SODIUM (PORCINE) 1000 UNIT/ML IJ SOLN
INTRAMUSCULAR | Status: AC
  Filled 2021-04-21: qty 4

## 2021-04-21 NOTE — Plan of Care (Signed)
  Problem: Clinical Measurements: Goal: Diagnostic test results will improve Outcome: Progressing   

## 2021-04-21 NOTE — Progress Notes (Signed)
[facility] Progress Note   80 y.o. male HTN Afib COPD CASHD ESRD TTS in [place] with last treatment prior to hospitalization on Sat. He moved before being accepted at any unit here in [place]. Recent COVID infection a month ago but mild and not treated; he has been vaccinated with booster as well. He denies dyspnea but has a intermittent nonproductive cough; he denies fever, chills, nausea, myalgias, diarrhea, abdominal pain or chest pain.   Dialysis orders: 4hr 2/2 bath 180 NRe RIJ TC EDW appears to be 65 kg based on review of past few treatments Calcitriol 1.5 mcg PO TIW Mircera 75 q2 weeks (last 7/19)  Assessment/ Plan:   ESRD - moved here from [place]  prior to being accepted at a unit. Last HD was on Saturday. HD 7/29 with 2.9 L net UF  Because of staffing issues patient is on for HD today; this would be his 2nd treatment this week.  Then will resume TTS next week.  - Already notified renal navigator for placement. - Restrict right arm for future dialysis access; vein mapping done and will need RUA AVG per VVS as an outpatient. Appreciate VVS seeing the patient so promptly.  Renal osteodystrophy - Will phos for management but restart Phoslo 3 tabs TIDM; he's had issues with compliance in the past but Phos 7/27 was 3.3.  Will recheck phos on Mon.  Anemia - Last Mircera given 7/19) - Single  dose of Feraheme given on 7/27. HTN - restart home meds and will adjust as needed + UF on HD. COVID a month ago - currently asymptomatic except mild nonproductive cough; was not treated but he has been vaccinated + booster. CASHD -> last cath 03/04/21 followed by cards in [place] COPD Afib not an anticoag bec of fall risk and h/o bleeding; being rate controlled.  Subjective:   Feeling well Denies f/c/n/v/dsypnea. I again explained why he needs HD today  and with today's tx will still only have 2 txs this past week..   Objective:   BP (!) 176/65 (BP Location:  Right Arm)   Pulse 67   Temp (!) 97.4 F (36.3 C) (Oral)   Resp 16   Ht '5\' 7"'$  (1.702 m)   Wt 65.1 kg   SpO2 95%   BMI 22.48 kg/m  No intake or output data in the 24 hours ending 04/21/21 0803  Weight change:   Physical Exam: GEN: NAD, A&Ox3, NCAT HEENT: No conjunctival pallor, EOMI NECK: Supple, no thyromegaly LUNGS: CTA B/L no rales, rhonchi or wheezing CV: RRR, No M/R/G ABD: SNDNT +BS  EXT: No lower extremity edema ACCESS: RIJ TC   Imaging: No results found.  Labs: BMET Recent Labs  Lab 04/16/21 1046 04/17/21 0620 04/20/21 1718  NA 143 141 137  K 3.8 4.4 3.8  CL 103 101 100  CO2 '28 25 24  '$ GLUCOSE 124* 126* 138*  BUN 50* 56* 54*  CREATININE 12.20* 13.12* 8.93*  CALCIUM 9.2 8.9 9.0  PHOS  --  3.3 2.4*   CBC Recent Labs  Lab 04/16/21 1046 04/17/21 0620 04/20/21 1718  WBC 4.0 4.9 9.7  NEUTROABS 2.8 4.1  --   HGB 6.5* 8.5* 10.6*  HCT 21.6* 27.9* 32.7*  MCV 101.9* 98.2 94.5  PLT 212 179 232    Medications:     sodium chloride   Intravenous Once   amLODipine  10 mg Oral Daily   atorvastatin  20 mg Oral Daily   calcium acetate  2,001  mg Oral TID WC   carvedilol  6.25 mg Oral BID WC   Chlorhexidine Gluconate Cloth  6 each Topical Q0600   feeding supplement (NEPRO CARB STEADY)  237 mL Oral Q24H   hydrALAZINE  100 mg Oral BID   ipratropium-albuterol  3 mL Nebulization Q6H   isosorbide mononitrate  30 mg Oral Daily   ketotifen  1 drop Both Eyes BID   melatonin  3 mg Oral QHS   memantine  5 mg Oral Daily   mometasone-formoterol  2 puff Inhalation BID   multivitamin  1 tablet Oral QHS   pantoprazole  40 mg Oral Q0600   umeclidinium bromide  1 puff Inhalation Daily      Otelia Santee, MD 04/21/2021, 8:03 AM

## 2021-04-21 NOTE — Progress Notes (Signed)
[facility] PROGRESS NOTE    Progress Note  Anthony [name]  [id number] DOB: 1941/09/01 DOA: 04/16/2021 PCP: Isaac Bliss, Rayford Halsted, MD     Brief Narrative:   Anthony [name] is an 80 y.o. male past medical history of essential hypertension, atrial fibrillation not on anticoagulation due to fall risk and history of bleeding, end-stage renal disease at [facility], vascular dementia, alcohol abuse, recent COVID infection but has been vaccinated and boosted.  Presented to the ED after being sent from dialysis as her hemoglobin was found to have a hemoglobin of 6.5, FOBT was positive.  Patient is stable to discharge to skilled nursing facility awaiting skilled nursing facility placement and also clipping to a dialysis center  Assessment/Plan:   Symptomatic anemia with a positive FOBT/ABLA 2/2 to angiodisplasia: He status post 2 units of packed red blood cells her hemoglobin this morning is 8.5.   Only on Protonix twice a day. GI was consulted to perform endoscopy they found 2 nonbleeding angiodysplastic lesions treated with argon plasma. Continue to hold aspirin patient is stable awaiting placement to dialysis center. Physical therapy evaluated the patient recommended skilled nursing facility.  COPD with acute exacerbation: Improve saturations satting greater 90%. Completed his course of antibiotics in house continue wean down steroids.  Positive serum QuantiFERON: The patient is asymptomatic, chest x-ray showed mild basilar atelectasis versus scarring. Discussed with ID he is currently asymptomatic they will see medicine outpatient.  End-stage renal disease: Nephrology has been consulted for HD today. Patient is awaiting hemodialysis center clipping.  To discharge to skilled nursing facility.  Tobacco abuse: She refused nicotine patch.  Paroxysmal atrial fibrillation: Rate controlled on Coreg not on anticoagulation due to fall risk and GI  bleed.  Essential hypertension: Improved today continue current regimen.  Chronic diastolic heart failure: Resume Coreg and hydralazine fluid status per renal.  CAD: Hold aspirin Hemoccult positive.  Hyperlipidemia:  Excellent continue statins.  Prolonged QTC: Monitor.  Dementia without behavioral disturbances: At risk of aspiration and acute confusional state melatonin at night Seroquel at bedtime as needed.   DVT prophylaxis: lovenox Family Communication:wife Status is: Inpatient  Remains inpatient appropriate because:Hemodynamically unstable  Dispo: The patient is from: Home              Anticipated d/c is to: Home              Patient currently is not medically stable to d/c.   Difficult to place patient No        Code Status:     Code Status Orders  (From admission, onward)           Start     Ordered   04/16/21 1930  Full code  Continuous        04/16/21 1934           Code Status History     Date Active Date Inactive Code Status Order ID Comments User Context   02/29/2020 1647 03/02/2020 2311 Full Code [id number]  Mckinley Jewel, MD ED         IV Access:   Peripheral IV   Procedures and diagnostic studies:   No results found.   Medical Consultants:   None.   Subjective:    Anthony [name] no complaints  Objective:    Vitals:   04/21/21 0910 04/21/21 0930 04/21/21 1000 04/21/21 1030  BP: (!) 152/73 (!) 155/76 115/70 130/72  Pulse: 72 67 70 70  Resp: '18 18 18 '$ 18  Temp:      TempSrc:      SpO2:      Weight:      Height:       SpO2: 96 % O2 Flow Rate (L/min): 2 L/min  No intake or output data in the 24 hours ending 04/21/21 1107  Filed Weights   04/19/21 0745 04/19/21 1138 04/21/21 0900  Weight: 68 kg 65.1 kg 65.7 kg    Exam: General exam: In no acute distress. Respiratory system: Good air movement and clear to auscultation. Cardiovascular system: S1 & S2 heard, RRR. No JVD. Gastrointestinal system:  Abdomen is nondistended, soft and nontender.  Extremities: No pedal edema. Skin: No rashes, lesions or ulcers  Data Reviewed:    Labs: Basic Metabolic Panel: Recent Labs  Lab 04/16/21 1046 04/17/21 0620 04/20/21 1718  NA 143 141 137  K 3.8 4.4 3.8  CL 103 101 100  CO2 '28 25 24  '$ GLUCOSE 124* 126* 138*  BUN 50* 56* 54*  CREATININE 12.20* 13.12* 8.93*  CALCIUM 9.2 8.9 9.0  MG 2.5* 2.4  --   PHOS  --  3.3 2.4*    GFR Estimated Creatinine Clearance: 6.4 mL/min (A) (by C-G formula based on SCr of 8.93 mg/dL (H)). Liver Function Tests: Recent Labs  Lab 04/17/21 0620 04/20/21 1718  AST 21  22  --   ALT 22  22  --   ALKPHOS 55  56  --   BILITOT 0.5  0.5  --   PROT 6.8  6.6  --   ALBUMIN 3.2*  3.3* 3.4*    No results for input(s): LIPASE, AMYLASE in the last 168 hours. Recent Labs  Lab 04/16/21 2030  AMMONIA 59*    Coagulation profile No results for input(s): INR, PROTIME in the last 168 hours. COVID-19 Labs  No results for input(s): DDIMER, FERRITIN, LDH, CRP in the last 72 hours.   Lab Results  Component Value Date   SARSCOV2NAA POSITIVE (A) 04/16/2021   Shelley NEGATIVE 02/29/2020    CBC: Recent Labs  Lab 04/16/21 1046 04/17/21 0620 04/20/21 1718  WBC 4.0 4.9 9.7  NEUTROABS 2.8 4.1  --   HGB 6.5* 8.5* 10.6*  HCT 21.6* 27.9* 32.7*  MCV 101.9* 98.2 94.5  PLT 212 179 232    Cardiac Enzymes: No results for input(s): CKTOTAL, CKMB, CKMBINDEX, TROPONINI in the last 168 hours. BNP (last 3 results) No results for input(s): PROBNP in the last 8760 hours. CBG: No results for input(s): GLUCAP in the last 168 hours. D-Dimer: No results for input(s): DDIMER in the last 72 hours. Hgb A1c: No results for input(s): HGBA1C in the last 72 hours. Lipid Profile: No results for input(s): CHOL, HDL, LDLCALC, TRIG, CHOLHDL, LDLDIRECT in the last 72 hours. Thyroid function studies: No results for input(s): TSH, T4TOTAL, T3FREE, THYROIDAB in the last  72 hours.  Invalid input(s): FREET3  Anemia work up: No results for input(s): VITAMINB12, FOLATE, FERRITIN, TIBC, IRON, RETICCTPCT in the last 72 hours.  Sepsis Labs: Recent Labs  Lab 04/16/21 1046 04/17/21 0620 04/20/21 1718  WBC 4.0 4.9 9.7    Microbiology Recent Results (from the past 240 hour(s))  SARS CORONAVIRUS 2 (TAT 6-24 HRS) Nasopharyngeal Nasopharyngeal Swab     Status: Abnormal   Collection Time: 04/16/21  7:06 PM   Specimen: Nasopharyngeal Swab  Result Value Ref Range Status   SARS Coronavirus 2 POSITIVE (A) NEGATIVE Final    Comment: (NOTE) SARS-CoV-2 target nucleic acids are DETECTED.  The  SARS-CoV-2 RNA is generally detectable in upper and lower respiratory specimens during the acute phase of infection. Positive results are indicative of the presence of SARS-CoV-2 RNA. Clinical correlation with patient history and other diagnostic information is  necessary to determine patient infection status. Positive results do not rule out bacterial infection or co-infection with other viruses.  The expected result is Negative.  Fact Sheet for Patients: SugarRoll.be  Fact Sheet for Healthcare Providers: https://www.woods-mathews.com/  This test is not yet approved or cleared by the [place] FDA and  has been authorized for detection and/or diagnosis of SARS-CoV-2 by FDA under an Emergency Use Authorization (EUA). This EUA will remain  in effect (meaning this test can be used) for the duration of the COVID-19 declaration under Section 564(b)(1) of the Act, 21 U. S.C. section 360bbb-3(b)(1), unless the authorization is terminated or revoked sooner.   Performed at [facility], [place] [address] [place], [place] [address]      Medications:    sodium chloride   Intravenous Once   amLODipine  10 mg Oral Daily   atorvastatin  20 mg Oral Daily   calcium acetate  2,001 mg Oral TID WC   carvedilol  6.25 mg Oral  BID WC   Chlorhexidine Gluconate Cloth  6 each Topical Q0600   feeding supplement (NEPRO CARB STEADY)  237 mL Oral Q24H   heparin sodium (porcine)       hydrALAZINE  100 mg Oral BID   ipratropium-albuterol  3 mL Nebulization Q6H   isosorbide mononitrate  30 mg Oral Daily   ketotifen  1 drop Both Eyes BID   melatonin  3 mg Oral QHS   memantine  5 mg Oral Daily   mometasone-formoterol  2 puff Inhalation BID   multivitamin  1 tablet Oral QHS   pantoprazole  40 mg Oral Q0600   umeclidinium bromide  1 puff Inhalation Daily   Continuous Infusions:  sodium chloride        LOS: 5 days   Charlynne Cousins  [facility]  04/21/2021, 11:07 AM

## 2021-04-22 DIAGNOSIS — N186 End stage renal disease: Secondary | ICD-10-CM | POA: Diagnosis not present

## 2021-04-22 DIAGNOSIS — D649 Anemia, unspecified: Secondary | ICD-10-CM | POA: Diagnosis not present

## 2021-04-22 LAB — PHOSPHORUS: Phosphorus: 1.9 mg/dL — ABNORMAL LOW (ref 2.5–4.6)

## 2021-04-22 NOTE — Care Management Important Message (Signed)
Important Message  Patient Details  Name: Anthony [name] MRN: [id number] Date of Birth: 08-28-1941   Medicare Important Message Given:  Yes     Orbie Pyo 04/22/2021, 4:35 PM

## 2021-04-22 NOTE — Progress Notes (Signed)
Occupational Therapy Treatment Patient Details Name: Anthony [name] MRN: [id number] DOB: 09/24/1940 Today's Date: 04/22/2021    History of present illness 80 y.o. male presenting to ED 7/26 with SOB after missing dialysis. Patient recently moved to [place] from [place] and is trying to get set up with PCP and dialysis center. Patient also reporting rectal bleeding. Patient admitted with symptomatic anemia and COPD with acute exacerbation. PMHx significant for ESRD on HD T,Th, Sat prior to move, anemia, vascular dementia, ETOH use disorder, anemia, CHF, COPD, CVA, GIB, HTN, A-fib, HTN, tobacco use disorder, and Hx of positive PPD.   OT comments  OT treatment session with focus on self-care re-education, energy conservation and safety with RW for household/community mobility and during ADLs. Patient ambulated up to 253f with use of RW and close supervision A requiring 1 standing rest break 2/2 fatigue. OT to provide written energy conservation education at time of next treatment session.    Follow Up Recommendations  No OT follow up    Equipment Recommendations  None recommended by OT    Recommendations for Other Services      Precautions / Restrictions Precautions Precautions: Fall Restrictions Weight Bearing Restrictions: No       Mobility Bed Mobility Overal bed mobility: Independent                  Transfers Overall transfer level: Needs assistance Equipment used: None Transfers: Sit to/from Stand Sit to Stand: Min guard         General transfer comment: Min guard for safety.    Balance Overall balance assessment: Needs assistance Sitting-balance support: Feet supported;No upper extremity supported Sitting balance-Leahy Scale: Good     Standing balance support: During functional activity;No upper extremity supported Standing balance-Leahy Scale: Poor Standing balance comment: Reliant on BUE on RW.                           ADL either  performed or assessed with clinical judgement   ADL Overall ADL's : Needs assistance/impaired                 Upper Body Dressing : Supervision/safety;Min guard Upper Body Dressing Details (indicate cue type and reason): Able to doff/don footwear seated EOB without external assist.                   General ADL Comments: [place] continues to be limited by 2/4 DOE with mild activity, poor activity pacing and balance deficits.     Vision       Perception     Praxis      Cognition Arousal/Alertness: Awake/alert Behavior During Therapy: WFL for tasks assessed/performed Overall Cognitive Status: Within Functional Limits for tasks assessed                                 General Comments: Vascular dementia in hx in chart        Exercises     Shoulder Instructions       General Comments VSS on RA despite 2/4 DOE.    Pertinent Vitals/ Pain       Pain Assessment: No/denies pain  Home Living  Prior Functioning/Environment              Frequency  Min 2X/week        Progress Toward Goals  OT Goals(current goals can now be found in the care plan section)  Progress towards OT goals: Progressing toward goals  Acute Rehab OT Goals Patient Stated Goal: To return home. OT Goal Formulation: With patient Time For Goal Achievement: 05/01/21 Potential to Achieve Goals: Good ADL Goals Additional ADL Goal #1: Patient will complete ADLs with I in prep for safe d/c home. Additional ADL Goal #2: Patient will recall 3 energy conservation techniques in prep for ADLs/IADLs. Additional ADL Goal #3: Patient will tolerate 15 minutes of therapeutic activity without need for rest break indicating improved activity tolerance.  Plan Discharge plan remains appropriate;Frequency remains appropriate    Co-evaluation                 AM-PAC OT "6 Clicks" Daily Activity     Outcome  Measure   Help from another person eating meals?: None Help from another person taking care of personal grooming?: A Little Help from another person toileting, which includes using toliet, bedpan, or urinal?: A Little Help from another person bathing (including washing, rinsing, drying)?: A Little Help from another person to put on and taking off regular upper body clothing?: None Help from another person to put on and taking off regular lower body clothing?: A Little 6 Click Score: 20    End of Session Equipment Utilized During Treatment: Gait belt;Rolling walker  OT Visit Diagnosis: Muscle weakness (generalized) (M62.81)   Activity Tolerance Patient tolerated treatment well;Patient limited by fatigue   Patient Left in bed;with call bell/phone within reach;with bed alarm set   Nurse Communication Mobility status;Other (comment) (Response to treatment.)        Time: [id number] OT Time Calculation (min): 13 min  Charges: OT General Charges $OT Visit: 1 Visit OT Treatments $Therapeutic Activity: 8-22 mins  Yanitza Shvartsman H. OTR/L Supplemental OT, Department of rehab services [phone]   Akemi Overholser R H. 04/22/2021, 1:45 PM

## 2021-04-22 NOTE — Progress Notes (Signed)
Physical Therapy Treatment Patient Details Name: Anthony [name] MRN: [id number] DOB: 02-28-1941 Today's Date: 04/22/2021    History of Present Illness 80 y.o. male presenting to ED 7/26 with SOB after missing dialysis. Patient recently moved to [place] from [place] and is trying to get set up with PCP and dialysis center. Patient also reporting rectal bleeding. Patient admitted with symptomatic anemia and COPD with acute exacerbation. PMHx significant for ESRD on HD T,Th, Sat prior to move, anemia, vascular dementia, ETOH use disorder, anemia, CHF, COPD, CVA, GIB, HTN, A-fib, HTN, tobacco use disorder, and Hx of positive PPD.    PT Comments    Pt admitted with above diagnosis. Pt was able to ambulate in hall and incr distance. Scored 16/24 on DGI suggesting at risk of falls without the device. Pt aware that PT recommends he use RW at home.  Pt DOE 2/4 and  pt was 96% on RA.   Pt currently with functional limitations due to balance and endurance deficits. Pt will benefit from skilled PT to increase their independence and safety with mobility to allow discharge to the venue listed below.      Follow Up Recommendations  Home health PT     Equipment Recommendations  None recommended by PT    Recommendations for Other Services       Precautions / Restrictions Precautions Precautions: Fall Restrictions Weight Bearing Restrictions: No    Mobility  Bed Mobility Overal bed mobility: Independent                  Transfers Overall transfer level: Needs assistance Equipment used: None Transfers: Sit to/from Stand Sit to Stand: Min guard         General transfer comment: Min guard for safety.  Ambulation/Gait Ambulation/Gait assistance: Min guard;Min assist Gait Distance (Feet): 450 Feet Assistive device: None;Rolling walker (2 wheeled) Gait Pattern/deviations: Step-through pattern;Drifts right/left Gait velocity: mildly decreased   General Gait Details: Demonstrated  brief LOB at times especially with challenges and during balance test.  Min guard to min for safety without device. Pt much steadier with device and recommended to pt to use at all times and pt agrees.   Stairs Stairs: Yes Stairs assistance: Min assist;Min guard Stair Management: One rail Left;Alternating pattern;Step to pattern   General stair comments: Alternating pattern ascending; step to pattern descending. Min A with pace and guidance when descending stairs. Min guard when ascending for safety.   Wheelchair Mobility    Modified Rankin (Stroke Patients Only)       Balance Overall balance assessment: Needs assistance Sitting-balance support: Feet supported;No upper extremity supported Sitting balance-Leahy Scale: Good     Standing balance support: During functional activity;No upper extremity supported Standing balance-Leahy Scale: Poor Standing balance comment: Pt experienced brief LOB during functional activity needing bil UE support for safety                 Standardized Balance Assessment Standardized Balance Assessment : Dynamic Gait Index   Dynamic Gait Index Level Surface: Normal Change in Gait Speed: Mild Impairment Gait with Horizontal Head Turns: Moderate Impairment Gait with Vertical Head Turns: Mild Impairment Gait and Pivot Turn: Mild Impairment Step Over Obstacle: Normal Step Around Obstacles: Mild Impairment Steps: Moderate Impairment Total Score: 16      Cognition Arousal/Alertness: Awake/alert Behavior During Therapy: WFL for tasks assessed/performed Overall Cognitive Status: Within Functional Limits for tasks assessed  General Comments: Vascular dementia in hx in chart      Exercises      General Comments        Pertinent Vitals/Pain Pain Assessment: No/denies pain    Home Living                      Prior Function            PT Goals (current goals can now be found  in the care plan section) Acute Rehab PT Goals Patient Stated Goal: To return home. Progress towards PT goals: Progressing toward goals    Frequency    Min 3X/week      PT Plan Current plan remains appropriate    Co-evaluation              AM-PAC PT "6 Clicks" Mobility   Outcome Measure  Help needed turning from your back to your side while in a flat bed without using bedrails?: None Help needed moving from lying on your back to sitting on the side of a flat bed without using bedrails?: None Help needed moving to and from a bed to a chair (including a wheelchair)?: A Little Help needed standing up from a chair using your arms (e.g., wheelchair or bedside chair)?: A Little Help needed to walk in hospital room?: A Little Help needed climbing 3-5 steps with a railing? : A Little 6 Click Score: 20    End of Session Equipment Utilized During Treatment: Gait belt Activity Tolerance: Patient tolerated treatment well Patient left: in chair;with call bell/phone within reach;with chair alarm set Nurse Communication: Mobility status PT Visit Diagnosis: Unsteadiness on feet (R26.81);Other abnormalities of gait and mobility (R26.89)     Time: [id number] PT Time Calculation (min) (ACUTE ONLY): 13 min  Charges:  $Gait Training: 8-22 mins                     Romonda Parker M,PT Acute Rehab Services [phone] (pager)    [name] 04/22/2021, 10:05 AM

## 2021-04-22 NOTE — Progress Notes (Signed)
[facility] Progress Note   80 y.o. male HTN Afib COPD CASHD ESRD TTS in [place] with last treatment prior to hospitalization on Sat. He moved before being accepted at any unit here in [place]. Recent COVID infection a month ago but mild and not treated; he has been vaccinated with booster as well. He denies dyspnea but has a intermittent nonproductive cough; he denies fever, chills, nausea, myalgias, diarrhea, abdominal pain or chest pain.   Dialysis orders: 4hr 2/2 bath 180 NRe RIJ TC EDW appears to be 65 kg based on review of past few treatments Calcitriol 1.5 mcg PO TIW Mircera 75 q2 weeks (last 7/19)  Assessment/ Plan:   ESRD - moved here from [place] prior to being accepted at a unit.  Continue dialysis TTS schedule  - Already notified renal navigator for placement. - Restrict right arm for future dialysis access; vein mapping done and will need RUA AVG per [facility]VS as an outpatient. Appreciate [facility] seeing the patient   Renal osteodystrophy -Phosphorus very low.  Stop binders  Anemia - Last Mircera given 7/19).  Hemoglobin at goal at 11.2 - Single  dose of Feraheme given on 7/27. HTN -continue medications as prescribed COVID a month ago - currently asymptomatic except mild nonproductive cough; was not treated but he has been vaccinated + booster. CASHD -> last cath 03/04/21 followed by cards in [place] COPD Afib not an anticoag bec of fall risk and h/o bleeding; being rate controlled.  Dispo: Patient is medically stable to discharge.  Awaiting placement  Subjective:   Feels well with no complaints.  Awaiting transition to outpatient dialysis   Objective:   BP 137/64 (BP Location: Right Arm)   Pulse 78   Temp 98.2 F (36.8 C) (Oral)   Resp 18   Ht '5\' 7"'$  (1.702 m)   Wt 64.3 kg   SpO2 96%   BMI 22.20 kg/m   Intake/Output Summary (Last 24 hours) at 04/22/2021 1101 Last data filed at 04/21/2021 1145 Gross per 24 hour  Intake --  Output 1442 ml   Net -1442 ml    Weight change:   Physical Exam: GEN: NAD, lying in bed HEENT: No conjunctival pallor, EOMI LUNGS: Bilateral chest rise with no increased work of breathing CV: Rate, no audible murmur ABD: SNDNT +BS  EXT: No lower extremity edema ACCESS: RIJ TC   Imaging: No results found.  Labs: BMET Recent Labs  Lab 04/16/21 1046 04/17/21 0620 04/20/21 1718 04/21/21 1247 04/22/21 0143  NA 143 141 137 136  --   K 3.8 4.4 3.8 3.4*  --   CL 103 101 100 100  --   CO2 '28 25 24 27  '$ --   GLUCOSE 124* 126* 138* 101*  --   BUN 50* 56* 54* 31*  --   CREATININE 12.20* 13.12* 8.93* 5.75*  --   CALCIUM 9.2 8.9 9.0 8.4*  --   PHOS  --  3.3 2.4* 1.9* 1.9*   CBC Recent Labs  Lab 04/16/21 1046 04/17/21 0620 04/20/21 1718 04/21/21 1247  WBC 4.0 4.9 9.7 9.3  NEUTROABS 2.8 4.1  --   --   HGB 6.5* 8.5* 10.6* 11.2*  HCT 21.6* 27.9* 32.7* 34.4*  MCV 101.9* 98.2 94.5 95.0  PLT 212 179 232 205    Medications:     sodium chloride   Intravenous Once   amLODipine  10 mg Oral Daily   atorvastatin  20 mg Oral Daily   carvedilol  6.25  mg Oral BID WC   Chlorhexidine Gluconate Cloth  6 each Topical Q0600   feeding supplement (NEPRO CARB STEADY)  237 mL Oral Q24H   hydrALAZINE  100 mg Oral BID   ipratropium-albuterol  3 mL Nebulization BID   isosorbide mononitrate  30 mg Oral Daily   ketotifen  1 drop Both Eyes BID   melatonin  3 mg Oral QHS   memantine  5 mg Oral Daily   mometasone-formoterol  2 puff Inhalation BID   multivitamin  1 tablet Oral QHS   pantoprazole  40 mg Oral Q0600   umeclidinium bromide  1 puff Inhalation Daily      Reesa Chew  04/22/2021, 11:01 AM

## 2021-04-22 NOTE — Progress Notes (Signed)
[facility] PROGRESS NOTE    Progress Note  Anthony [name]  [id number] DOB: 1941/07/19 DOA: 04/16/2021 PCP: Isaac Bliss, Rayford Halsted, MD     Brief Narrative:   Anthony [name] is an 80 y.o. male past medical history of essential hypertension, atrial fibrillation not on anticoagulation due to fall risk and history of bleeding, end-stage renal disease at [facility], vascular dementia, alcohol abuse, recent COVID infection but has been vaccinated and boosted.  Presented to the ED after being sent from dialysis as her hemoglobin was found to have a hemoglobin of 6.5, FOBT was positive.  Patient is stable to discharge to skilled nursing facility awaiting skilled nursing facility placement and also clipping to a dialysis center  Assessment/Plan:   Symptomatic anemia with a positive FOBT/ABLA 2/2 to angiodisplasia: He status post 2 units of packed red blood cells her hemoglobin this morning is 8.5.   Only on Protonix twice a day. GI was consulted to perform endoscopy they found 2 nonbleeding angiodysplastic lesions treated with argon plasma. Continue to hold aspirin patient is stable awaiting placement to dialysis center. Physical therapy evaluated the patient recommended skilled nursing facility.  COPD with acute exacerbation: Improve saturations satting greater 90%. Completed his course of antibiotics in house continue wean down steroids.  Positive serum QuantiFERON: The patient is asymptomatic, chest x-ray showed mild basilar atelectasis versus scarring. Discussed with ID he is currently asymptomatic they will see medicine outpatient.  End-stage renal disease: Nephrology has been consulted for HD today. Patient is awaiting hemodialysis center clipping.  To discharge to skilled nursing facility.  Tobacco abuse: She refused nicotine patch.  Paroxysmal atrial fibrillation: Rate controlled on Coreg not on anticoagulation due to fall risk and GI  bleed.  Essential hypertension: Improved today continue current regimen.  Chronic diastolic heart failure: Resume Coreg and hydralazine fluid status per renal.  CAD: Hold aspirin Hemoccult positive.  Hyperlipidemia:  Excellent continue statins.  Prolonged QTC: Monitor.  Dementia without behavioral disturbances: At risk of aspiration and acute confusional state melatonin at night Seroquel at bedtime as needed.   DVT prophylaxis: lovenox Family Communication:wife Status is: Inpatient  Remains inpatient appropriate because:Hemodynamically unstable  Dispo: The patient is from: Home              Anticipated d/c is to: Home              Patient currently is not medically stable to d/c.   Difficult to place patient No        Code Status:     Code Status Orders  (From admission, onward)           Start     Ordered   04/16/21 1930  Full code  Continuous        04/16/21 1934           Code Status History     Date Active Date Inactive Code Status Order ID Comments User Context   02/29/2020 1647 03/02/2020 2311 Full Code [id number]  Mckinley Jewel, MD ED         IV Access:   Peripheral IV   Procedures and diagnostic studies:   No results found.   Medical Consultants:   None.   Subjective:    Anthony [name] no complaints  Objective:    Vitals:   04/22/21 0821 04/22/21 0824 04/22/21 0825 04/22/21 1000  BP:    137/64  Pulse:    78  Resp:    18  Temp:  98.2 F (36.8 C)  TempSrc:    Oral  SpO2: 97% 97% 97% 96%  Weight:      Height:       SpO2: 96 % O2 Flow Rate (L/min): 2 L/min  No intake or output data in the 24 hours ending 04/22/21 1159  Filed Weights   04/19/21 1138 04/21/21 0900 04/21/21 1145  Weight: 65.1 kg 65.7 kg 64.3 kg    Exam: General exam: In no acute distress. Respiratory system: Good air movement and clear to auscultation. Cardiovascular system: S1 & S2 heard, RRR. No JVD. Gastrointestinal system:  Abdomen is nondistended, soft and nontender.  Extremities: No pedal edema. Skin: No rashes, lesions or ulcers  Data Reviewed:    Labs: Basic Metabolic Panel: Recent Labs  Lab 04/16/21 1046 04/17/21 0620 04/20/21 1718 04/21/21 1247 04/22/21 0143  NA 143 141 137 136  --   K 3.8 4.4 3.8 3.4*  --   CL 103 101 100 100  --   CO2 '28 25 24 27  '$ --   GLUCOSE 124* 126* 138* 101*  --   BUN 50* 56* 54* 31*  --   CREATININE 12.20* 13.12* 8.93* 5.75*  --   CALCIUM 9.2 8.9 9.0 8.4*  --   MG 2.5* 2.4  --   --   --   PHOS  --  3.3 2.4* 1.9* 1.9*    GFR Estimated Creatinine Clearance: 9.8 mL/min (A) (by C-G formula based on SCr of 5.75 mg/dL (H)). Liver Function Tests: Recent Labs  Lab 04/17/21 0620 04/20/21 1718 04/21/21 1247  AST 21  22  --   --   ALT 22  22  --   --   ALKPHOS 55  56  --   --   BILITOT 0.5  0.5  --   --   PROT 6.8  6.6  --   --   ALBUMIN 3.2*  3.3* 3.4* 3.3*    No results for input(s): LIPASE, AMYLASE in the last 168 hours. Recent Labs  Lab 04/16/21 2030  AMMONIA 59*    Coagulation profile No results for input(s): INR, PROTIME in the last 168 hours. COVID-19 Labs  No results for input(s): DDIMER, FERRITIN, LDH, CRP in the last 72 hours.   Lab Results  Component Value Date   SARSCOV2NAA POSITIVE (A) 04/16/2021   [place] NEGATIVE 02/29/2020    CBC: Recent Labs  Lab 04/16/21 1046 04/17/21 0620 04/20/21 1718 04/21/21 1247  WBC 4.0 4.9 9.7 9.3  NEUTROABS 2.8 4.1  --   --   HGB 6.5* 8.5* 10.6* 11.2*  HCT 21.6* 27.9* 32.7* 34.4*  MCV 101.9* 98.2 94.5 95.0  PLT 212 179 232 205    Cardiac Enzymes: No results for input(s): CKTOTAL, CKMB, CKMBINDEX, TROPONINI in the last 168 hours. BNP (last 3 results) No results for input(s): PROBNP in the last 8760 hours. CBG: No results for input(s): GLUCAP in the last 168 hours. D-Dimer: No results for input(s): DDIMER in the last 72 hours. Hgb A1c: No results for input(s): HGBA1C in the last  72 hours. Lipid Profile: No results for input(s): CHOL, HDL, LDLCALC, TRIG, CHOLHDL, LDLDIRECT in the last 72 hours. Thyroid function studies: No results for input(s): TSH, T4TOTAL, T3FREE, THYROIDAB in the last 72 hours.  Invalid input(s): FREET3  Anemia work up: No results for input(s): VITAMINB12, FOLATE, FERRITIN, TIBC, IRON, RETICCTPCT in the last 72 hours.  Sepsis Labs: Recent Labs  Lab 04/16/21 1046 04/17/21 0620 04/20/21 1718 04/21/21 1247  WBC 4.0 4.9 9.7 9.3    Microbiology Recent Results (from the past 240 hour(s))  SARS CORONAVIRUS 2 (TAT 6-24 HRS) Nasopharyngeal Nasopharyngeal Swab     Status: Abnormal   Collection Time: 04/16/21  7:06 PM   Specimen: Nasopharyngeal Swab  Result Value Ref Range Status   SARS Coronavirus 2 POSITIVE (A) NEGATIVE Final    Comment: (NOTE) SARS-CoV-2 target nucleic acids are DETECTED.  The SARS-CoV-2 RNA is generally detectable in upper and lower respiratory specimens during the acute phase of infection. Positive results are indicative of the presence of SARS-CoV-2 RNA. Clinical correlation with patient history and other diagnostic information is  necessary to determine patient infection status. Positive results do not rule out bacterial infection or co-infection with other viruses.  The expected result is Negative.  Fact Sheet for Patients: SugarRoll.be  Fact Sheet for Healthcare Providers: https://www.woods-mathews.com/  This test is not yet approved or cleared by the [place] FDA and  has been authorized for detection and/or diagnosis of SARS-CoV-2 by FDA under an Emergency Use Authorization (EUA). This EUA will remain  in effect (meaning this test can be used) for the duration of the COVID-19 declaration under Section 564(b)(1) of the Act, 21 U. S.C. section 360bbb-3(b)(1), unless the authorization is terminated or revoked sooner.   Performed at [facility], [place] [address], [place], [place] [address]      Medications:    sodium chloride   Intravenous Once   amLODipine  10 mg Oral Daily   atorvastatin  20 mg Oral Daily   carvedilol  6.25 mg Oral BID WC   Chlorhexidine Gluconate Cloth  6 each Topical Q0600   feeding supplement (NEPRO CARB STEADY)  237 mL Oral Q24H   hydrALAZINE  100 mg Oral BID   ipratropium-albuterol  3 mL Nebulization BID   isosorbide mononitrate  30 mg Oral Daily   ketotifen  1 drop Both Eyes BID   melatonin  3 mg Oral QHS   memantine  5 mg Oral Daily   mometasone-formoterol  2 puff Inhalation BID   multivitamin  1 tablet Oral QHS   pantoprazole  40 mg Oral Q0600   umeclidinium bromide  1 puff Inhalation Daily   Continuous Infusions:  sodium chloride        LOS: 6 days   Charlynne Cousins  [facility]  04/22/2021, 11:59 AM

## 2021-04-23 DIAGNOSIS — N186 End stage renal disease: Secondary | ICD-10-CM | POA: Diagnosis not present

## 2021-04-23 DIAGNOSIS — D649 Anemia, unspecified: Secondary | ICD-10-CM | POA: Diagnosis not present

## 2021-04-23 MED ORDER — HEPARIN SODIUM (PORCINE) 1000 UNIT/ML IJ SOLN
INTRAMUSCULAR | Status: AC
  Filled 2021-04-23: qty 4

## 2021-04-23 NOTE — Progress Notes (Signed)
[facility] Progress Note   80 y.o. male HTN Afib COPD CASHD ESRD TTS in [place] with last treatment prior to hospitalization on Sat. He moved before being accepted at any unit here in [place]. Recent COVID infection a month ago but mild and not treated; he has been vaccinated with booster as well. He denies dyspnea but has a intermittent nonproductive cough; he denies fever, chills, nausea, myalgias, diarrhea, abdominal pain or chest pain.   Dialysis orders: 4hr 2/2 bath 180 NRe RIJ TC EDW appears to be 65 kg based on review of past few treatments Calcitriol 1.5 mcg PO TIW Mircera 75 q2 weeks (last 7/19)  Assessment/ Plan:   ESRD - moved here from [place]  prior to being accepted at a unit.  Continue dialysis TTS schedule  - Already notified renal navigator for placement. - Restrict right arm for future dialysis access; vein mapping done and will need RUA AVG per [facility]VS as an outpatient. Appreciate [facility] seeing the patient   Renal osteodystrophy -Phosphorus very low.  Stop binders  Anemia - Last Mircera given 7/19).  Hemoglobin at goal at 11.2 - Single  dose of Feraheme given on 7/27. HTN -continue medications as prescribed COVID a month ago - currently asymptomatic except mild nonproductive cough; was not treated but he has been vaccinated + booster. CASHD -> last cath 03/04/21 followed by cards in [place] COPD Afib not an anticoag bec of fall risk and h/o bleeding; being rate controlled.  Dispo: Patient is medically stable to discharge.  Awaiting placement  Subjective:   Resting today with no complaints   Objective:   BP 131/62 (BP Location: Right Arm)   Pulse 72   Temp 98 F (36.7 C) (Oral)   Resp 16   Ht '5\' 7"'$  (1.702 m)   Wt 65.2 kg   SpO2 100%   BMI 22.51 kg/m   Intake/Output Summary (Last 24 hours) at 04/23/2021 0955 Last data filed at 04/23/2021 0912 Gross per 24 hour  Intake 360 ml  Output 1050 ml  Net -690 ml    Weight change:    Physical Exam: GEN: NAD, lying in bed HEENT: No conjunctival pallor, EOMI LUNGS: Bilateral chest rise with no increased work of breathing CV: normal rate ABD: SNDNT +BS  EXT: No lower extremity edema ACCESS: RIJ TC   Imaging: No results found.  Labs: BMET Recent Labs  Lab 04/16/21 1046 04/17/21 0620 04/20/21 1718 04/21/21 1247 04/22/21 0143  NA 143 141 137 136  --   K 3.8 4.4 3.8 3.4*  --   CL 103 101 100 100  --   CO2 '28 25 24 27  '$ --   GLUCOSE 124* 126* 138* 101*  --   BUN 50* 56* 54* 31*  --   CREATININE 12.20* 13.12* 8.93* 5.75*  --   CALCIUM 9.2 8.9 9.0 8.4*  --   PHOS  --  3.3 2.4* 1.9* 1.9*   CBC Recent Labs  Lab 04/16/21 1046 04/17/21 0620 04/20/21 1718 04/21/21 1247  WBC 4.0 4.9 9.7 9.3  NEUTROABS 2.8 4.1  --   --   HGB 6.5* 8.5* 10.6* 11.2*  HCT 21.6* 27.9* 32.7* 34.4*  MCV 101.9* 98.2 94.5 95.0  PLT 212 179 232 205    Medications:     sodium chloride   Intravenous Once   amLODipine  10 mg Oral Daily   atorvastatin  20 mg Oral Daily   carvedilol  6.25 mg Oral BID WC   Chlorhexidine  Gluconate Cloth  6 each Topical Q0600   feeding supplement (NEPRO CARB STEADY)  237 mL Oral Q24H   hydrALAZINE  100 mg Oral BID   isosorbide mononitrate  30 mg Oral Daily   ketotifen  1 drop Both Eyes BID   melatonin  3 mg Oral QHS   memantine  5 mg Oral Daily   mometasone-formoterol  2 puff Inhalation BID   multivitamin  1 tablet Oral QHS   pantoprazole  40 mg Oral Q0600   umeclidinium bromide  1 puff Inhalation Daily      Reesa Chew  04/23/2021, 9:55 AM

## 2021-04-23 NOTE — Progress Notes (Signed)
[facility] PROGRESS NOTE    Progress Note  Anthony [name]  [id number] DOB: 06/28/1941 DOA: 04/16/2021 PCP: Isaac Bliss, Rayford Halsted, MD     Brief Narrative:   Anthony [name] is an 80 y.o. male past medical history of essential hypertension, atrial fibrillation not on anticoagulation due to fall risk and history of bleeding, end-stage renal disease at [facility], vascular dementia, alcohol abuse, recent COVID infection but has been vaccinated and boosted.  Presented to the ED after being sent from dialysis as her hemoglobin was found to have a hemoglobin of 6.5, FOBT was positive.  Patient is stable to discharge to skilled nursing facility awaiting skilled nursing facility placement and also clipping to a dialysis center  Assessment/Plan:   Symptomatic anemia with a positive FOBT/ABLA 2/2 to angiodisplasia: He status post 2 units of packed red blood cells her hemoglobin this morning is 8.5.   Only on Protonix twice a day. GI was consulted to perform endoscopy they found 2 nonbleeding angiodysplastic lesions treated with argon plasma. Continue to hold aspirin patient is stable awaiting placement to dialysis center. Physical therapy evaluated the patient recommended skilled nursing facility.  COPD with acute exacerbation: Improve saturations satting greater 90%. Completed his course of antibiotics in house continue wean down steroids.  Positive serum QuantiFERON: The patient is asymptomatic, chest x-ray showed mild basilar atelectasis versus scarring. Discussed with ID he is currently asymptomatic they will see medicine outpatient.  End-stage renal disease: Nephrology has been consulted for HD today. Patient is awaiting hemodialysis center clipping.  To discharge to skilled nursing facility.  Tobacco abuse: She refused nicotine patch.  Paroxysmal atrial fibrillation: Rate controlled on Coreg not on anticoagulation due to fall risk and GI  bleed.  Essential hypertension: Improved today continue current regimen.  Chronic diastolic heart failure: Resume Coreg and hydralazine fluid status per renal.  CAD: Hold aspirin Hemoccult positive.  Hyperlipidemia:  Excellent continue statins.  Prolonged QTC: Monitor.  Dementia without behavioral disturbances: At risk of aspiration and acute confusional state melatonin at night Seroquel at bedtime as needed.   DVT prophylaxis: lovenox Family Communication:wife Status is: Inpatient  Remains inpatient appropriate because:Hemodynamically unstable  Dispo: The patient is from: Home              Anticipated d/c is to: Home              Patient currently is not medically stable to d/c.   Difficult to place patient No        Code Status:     Code Status Orders  (From admission, onward)           Start     Ordered   04/16/21 1930  Full code  Continuous        04/16/21 1934           Code Status History     Date Active Date Inactive Code Status Order ID Comments User Context   02/29/2020 1647 03/02/2020 2311 Full Code [id number]  Mckinley Jewel, MD ED         IV Access:   Peripheral IV   Procedures and diagnostic studies:   No results found.   Medical Consultants:   None.   Subjective:    [name] no complaints  Objective:    Vitals:   04/23/21 0930 04/23/21 1000 04/23/21 1030 04/23/21 1100  BP: 131/62 122/61 (!) 130/58 112/63  Pulse: 72 76 73 72  Resp: '16 16 16   '$ Temp:  TempSrc:      SpO2:      Weight:      Height:       SpO2: 100 % O2 Flow Rate (L/min): 2 L/min   Intake/Output Summary (Last 24 hours) at 04/23/2021 1144 Last data filed at 04/23/2021 0912 Gross per 24 hour  Intake 360 ml  Output 1050 ml  Net -690 ml    Filed Weights   04/21/21 0900 04/21/21 1145 04/23/21 0927  Weight: 65.7 kg 64.3 kg 65.2 kg    Exam: General exam: In no acute distress. Respiratory system: Good air movement and clear to  auscultation. Cardiovascular system: S1 & S2 heard, RRR. No JVD. Gastrointestinal system: Abdomen is nondistended, soft and nontender.  Extremities: No pedal edema. Skin: No rashes, lesions or ulcers  Data Reviewed:    Labs: Basic Metabolic Panel: Recent Labs  Lab 04/17/21 0620 04/20/21 1718 04/21/21 1247 04/22/21 0143  NA 141 137 136  --   K 4.4 3.8 3.4*  --   CL 101 100 100  --   CO2 '25 24 27  '$ --   GLUCOSE 126* 138* 101*  --   BUN 56* 54* 31*  --   CREATININE 13.12* 8.93* 5.75*  --   CALCIUM 8.9 9.0 8.4*  --   MG 2.4  --   --   --   PHOS 3.3 2.4* 1.9* 1.9*    GFR Estimated Creatinine Clearance: 9.9 mL/min (A) (by C-G formula based on SCr of 5.75 mg/dL (H)). Liver Function Tests: Recent Labs  Lab 04/17/21 0620 04/20/21 1718 04/21/21 1247  AST 21  22  --   --   ALT 22  22  --   --   ALKPHOS 55  56  --   --   BILITOT 0.5  0.5  --   --   PROT 6.8  6.6  --   --   ALBUMIN 3.2*  3.3* 3.4* 3.3*    No results for input(s): LIPASE, AMYLASE in the last 168 hours. Recent Labs  Lab 04/16/21 2030  AMMONIA 59*    Coagulation profile No results for input(s): INR, PROTIME in the last 168 hours. COVID-19 Labs  No results for input(s): DDIMER, FERRITIN, LDH, CRP in the last 72 hours.   Lab Results  Component Value Date   SARSCOV2NAA POSITIVE (A) 04/16/2021   [place] NEGATIVE 02/29/2020    CBC: Recent Labs  Lab 04/17/21 0620 04/20/21 1718 04/21/21 1247  WBC 4.9 9.7 9.3  NEUTROABS 4.1  --   --   HGB 8.5* 10.6* 11.2*  HCT 27.9* 32.7* 34.4*  MCV 98.2 94.5 95.0  PLT 179 232 205    Cardiac Enzymes: No results for input(s): CKTOTAL, CKMB, CKMBINDEX, TROPONINI in the last 168 hours. BNP (last 3 results) No results for input(s): PROBNP in the last 8760 hours. CBG: No results for input(s): GLUCAP in the last 168 hours. D-Dimer: No results for input(s): DDIMER in the last 72 hours. Hgb A1c: No results for input(s): HGBA1C in the last 72  hours. Lipid Profile: No results for input(s): CHOL, HDL, LDLCALC, TRIG, CHOLHDL, LDLDIRECT in the last 72 hours. Thyroid function studies: No results for input(s): TSH, T4TOTAL, T3FREE, THYROIDAB in the last 72 hours.  Invalid input(s): FREET3  Anemia work up: No results for input(s): VITAMINB12, FOLATE, FERRITIN, TIBC, IRON, RETICCTPCT in the last 72 hours.  Sepsis Labs: Recent Labs  Lab 04/17/21 0620 04/20/21 1718 04/21/21 1247  WBC 4.9 9.7 9.3  Microbiology Recent Results (from the past 240 hour(s))  SARS CORONAVIRUS 2 (TAT 6-24 HRS) Nasopharyngeal Nasopharyngeal Swab     Status: Abnormal   Collection Time: 04/16/21  7:06 PM   Specimen: Nasopharyngeal Swab  Result Value Ref Range Status   SARS Coronavirus 2 POSITIVE (A) NEGATIVE Final    Comment: (NOTE) SARS-CoV-2 target nucleic acids are DETECTED.  The SARS-CoV-2 RNA is generally detectable in upper and lower respiratory specimens during the acute phase of infection. Positive results are indicative of the presence of SARS-CoV-2 RNA. Clinical correlation with patient history and other diagnostic information is  necessary to determine patient infection status. Positive results do not rule out bacterial infection or co-infection with other viruses.  The expected result is Negative.  Fact Sheet for Patients: SugarRoll.be  Fact Sheet for Healthcare Providers: https://www.woods-mathews.com/  This test is not yet approved or cleared by the [place] FDA and  has been authorized for detection and/or diagnosis of SARS-CoV-2 by FDA under an Emergency Use Authorization (EUA). This EUA will remain  in effect (meaning this test can be used) for the duration of the COVID-19 declaration under Section 564(b)(1) of the Act, 21 U. S.C. section 360bbb-3(b)(1), unless the authorization is terminated or revoked sooner.   Performed at [facility], [place] [address],  [place], [place] [address]      Medications:    sodium chloride   Intravenous Once   amLODipine  10 mg Oral Daily   atorvastatin  20 mg Oral Daily   carvedilol  6.25 mg Oral BID WC   Chlorhexidine Gluconate Cloth  6 each Topical Q0600   feeding supplement (NEPRO CARB STEADY)  237 mL Oral Q24H   hydrALAZINE  100 mg Oral BID   isosorbide mononitrate  30 mg Oral Daily   ketotifen  1 drop Both Eyes BID   melatonin  3 mg Oral QHS   memantine  5 mg Oral Daily   mometasone-formoterol  2 puff Inhalation BID   multivitamin  1 tablet Oral QHS   pantoprazole  40 mg Oral Q0600   umeclidinium bromide  1 puff Inhalation Daily   Continuous Infusions:  sodium chloride        LOS: 7 days   Charlynne Cousins  [facility]  04/23/2021, 11:44 AM

## 2021-04-23 NOTE — Discharge Planning (Signed)
This patient has a confirmed outpatient dialysis seat at the [facility]/MWF 12:10 chairtime. Needs to arrive at 11:10 the first tx for paperwork. Patient will start on Friday since he is getting dialysis today. Juanell Fairly , NP notified, Dr. Joylene Grapes and case manager notified.

## 2021-04-23 NOTE — Progress Notes (Signed)
Nutrition Follow-up  DOCUMENTATION CODES:   Non-severe (moderate) malnutrition in context of chronic illness  INTERVENTION:  -Continue Nepro Shake po daily, each supplement provides 425 kcal and 19 grams protein -Continue Magic cup TID with meals, each supplement provides 290 kcal and 9 grams of protein -Continue rena-vit daily  NUTRITION DIAGNOSIS:   Moderate Malnutrition related to chronic illness (ESRD on HD) as evidenced by mild fat depletion, mild muscle depletion.  ongoing  GOAL:   Patient will meet greater than or equal to 90% of their needs  progressing  MONITOR:   PO intake, Supplement acceptance, Labs, Weight trends, I & O's  REASON FOR ASSESSMENT:   Consult Assessment of nutrition requirement/status  ASSESSMENT:   80 yo male with a PMH of ESRD on HD ([facility]), anemia, vascular dementia, EtOH abuse, chronic HF with preserved ejection fraction, COPD, CVA, esophagitis, GIB, HTN, p A-fib, pulmonary HTN, tobacco abuse, and hx of positive PPD who presents with anemia.  Per MD, pt is stable to discharge to SNF pending bed availability. Renal navigator also following to assist with HD placement.   Pt not in room at time of RD visit. Pt receiving HD per RN.   PO Intake: 50-100% x last 8 recorded meals (78% average meal intake)  EDW 65 kg Current weight: 65.2 kg Admission weight: 68.2 kg  UOP: 1015m x24 hours I/O: -[id number] admit  Medications: Nepro shake daily, rena-vit, protonix Labs: Recent Labs  Lab 04/17/21 0620 04/20/21 1718 04/21/21 1247 04/22/21 0143  NA 141 137 136  --   K 4.4 3.8 3.4*  --   CL 101 100 100  --   CO2 '25 24 27  '$ --   BUN 56* 54* 31*  --   CREATININE 13.12* 8.93* 5.75*  --   CALCIUM 8.9 9.0 8.4*  --   MG 2.4  --   --   --   PHOS 3.3 2.4* 1.9* 1.9*  GLUCOSE 126* 138* 101*  --    Diet Order:   Diet Order             Diet - low sodium heart healthy           Diet renal with fluid restriction Fluid restriction: 1200 mL  Fluid; Room service appropriate? Yes; Fluid consistency: Thin  Diet effective now                   EDUCATION NEEDS:   Education needs have been addressed  Skin:  Skin Assessment: Reviewed RN Assessment  Last BM:  7/31  Height:   Ht Readings from Last 1 Encounters:  04/18/21 '5\' 7"'$  (1.702 m)    Weight:   Wt Readings from Last 1 Encounters:  04/23/21 65.2 kg    BMI:  Body mass index is 22.51 kg/m.  Estimated Nutritional Needs:   Kcal:  1850-2050  Protein:  75-90 grams  Fluid:  >1.85 L    AmLarkin InaMS, RD, LDN (she/her/hers) RD pager number and weekend/on-call pager number located in [place]

## 2021-04-23 NOTE — Discharge Summary (Deleted)
Spoke with patient and wife about outpatient dialysis plans. Wife is familiar with the [facility] as she treats there too. Wife is familiar with transportation services and will add patient.

## 2021-04-23 NOTE — Plan of Care (Signed)

## 2021-04-23 NOTE — Plan of Care (Signed)

## 2021-04-24 MED ORDER — PANTOPRAZOLE SODIUM 40 MG PO TBEC: 40.0000 mg | DELAYED_RELEASE_TABLET | Freq: Every day | ORAL | Status: AC

## 2021-04-24 NOTE — TOC Initial Note (Addendum)
Transition of Care [facility]) - Initial/Assessment Note    Patient Details  Name: Anthony [name] MRN: [id number] Date of Birth: [birth year]  Transition of Care [facility]) CM/SW Contact:    Angelita Ingles, RN Phone Number: 04/24/2021, 10:36 AM  Clinical Narrative:                 Patient is from home and plans to return home with [facility]. CM at bedside to offer choice. HH has been set up with [facility]. Patient stated that he does have a PCP and he now has been set up with a dialysis seat. Patient has been updated on the info per renal navigator. Patient states that wife is to take care of the transportation needs. Patient has no other needs noted at this time. TOC will continue to follow.   Expected Discharge Plan: [place] Barriers to Discharge: Continued Medical Work up   Patient Goals and CMS Choice Patient states their goals for this hospitalization and ongoing recovery are:: Just ready to go home CMS Medicare.gov Compare Post Acute Care list provided to:: Patient Choice offered to / list presented to : Patient  Expected Discharge Plan and Services Expected Discharge Plan: [place] In-house Referral: NA Discharge Planning Services: CM Consult Post Acute Care Choice: [place] arrangements for the past 2 months: Single Family Home Expected Discharge Date: 04/19/21               DME Arranged: N/A DME Agency: NA       HH Arranged: PT HH Agency: [place] Date HH Agency Contacted: 04/24/21 Time HH Agency Contacted: 1034 Representative spoke with at [place]: Amy  Prior Living Arrangements/Services Living arrangements for the past 2 months: [place] with:: Spouse Patient language and need for interpreter reviewed:: Yes Do you feel safe going back to the place where you live?: Yes      Need for Family Participation in Patient Care: Yes (Comment) Care giver support system in place?: Yes (comment) Current home services:   (none) Criminal Activity/Legal Involvement Pertinent to Current Situation/Hospitalization: No - Comment as needed  Activities of Daily Living      Permission Sought/Granted Permission sought to share information with : Case Manager Permission granted to share information with : No              Emotional Assessment Appearance:: Appears stated age Attitude/Demeanor/Rapport: Gracious Affect (typically observed): Appropriate Orientation: : Oriented to Self, Oriented to Place, Oriented to Situation, Oriented to  Time Alcohol / Substance Use: Not Applicable Psych Involvement: No (comment)  Admission diagnosis:  Anemia [D64.9] ESRD (end stage renal disease) ([place]) [N18.6] Anemia, unspecified type [D64.9] Patient Active Problem List   Diagnosis Date Noted   Malnutrition of moderate degree 04/18/2021   Anemia 04/16/2021   Symptomatic anemia 04/16/2021   COPD (chronic obstructive pulmonary disease) ([place]) 04/16/2021   CAD (coronary artery disease) 04/16/2021   HLD (hyperlipidemia) 04/16/2021   Prolonged QT interval 04/16/2021   Abnormal ECG 04/16/2021   Dementia without behavioral disturbance ([place]) 04/16/2021   COPD with acute exacerbation ([place]) 04/16/2021   Occult blood positive stool 04/16/2021   COPD exacerbation ([place]) 03/01/2020   Tobacco use disorder 03/01/2020   Chronic diastolic heart failure (HCC)    PAF (paroxysmal atrial fibrillation) ([place])    Anemia of chronic disease 02/29/2020   Hypertensive urgency 02/29/2020   Elevated troponin 02/29/2020   Chest pain 02/29/2020   ESRD (end stage  renal disease) on dialysis ([place]) 12/15/2019   HTN (hypertension) 12/15/2019   Insomnia    PCP:  Isaac Bliss, Rayford Halsted, MD Pharmacy:   [id number] [id number] [id number]: [phone]: [phone]    Social Determinants of Health (SDOH) Interventions     Readmission Risk Interventions Readmission Risk Prevention Plan 04/24/2021  Transportation Screening Complete  PCP or Specialist Appt within 3-5 Days Complete  HRI or HDickeyComplete  Social Work Consult for RIngallsPlanning/Counseling Complete  Palliative Care Screening Not Applicable  Medication Review (Press photographer Referral to Pharmacy

## 2021-04-24 NOTE — Progress Notes (Signed)
[facility] Progress Note   80 y.o. male HTN Afib COPD CASHD ESRD TTS in [place] with last treatment prior to hospitalization on Sat. He moved before being accepted at any unit here in [place]. Recent COVID infection a month ago but mild and not treated; he has been vaccinated with booster as well. He denies dyspnea but has a intermittent nonproductive cough; he denies fever, chills, nausea, myalgias, diarrhea, abdominal pain or chest pain.   Dialysis orders: 4hr 2/2 bath 180 NRe RIJ TC EDW appears to be 65 kg based on review of past few treatments Calcitriol 1.5 mcg PO TIW Mircera 75 q2 weeks (last 7/19)  Assessment/ Plan:   ESRD - moved here from [place] prior to being accepted at a unit.  Continue dialysis TTS schedule  - Already notified renal navigator for placement. - Restrict right arm for future dialysis access; vein mapping done and will need RUA AVG per [facility] as an outpatient. Appreciate [facility] seeing the patient  -The patient has a dialysis chair at [facility] starting on Friday.  No plans for dialysis until he is outpatient.  Last had dialysis on 8/2  Renal osteodystrophy; Phos binder stopped because of low phosphorus  Anemia - Last Mircera given 7/19).  Hemoglobin at goal - Single  dose of Feraheme given on 7/27. HTN -blood pressure improved.  Stop hydralazine.  Continue other medications COVID a month ago - currently asymptomatic except mild nonproductive cough; was not treated but he has been vaccinated + booster. CASHD -> last cath 03/04/21 followed by cards in [place] COPD Afib not an anticoag bec of fall risk and h/o bleeding; being rate controlled.  Dispo: Patient is medically stable to discharge.  He has an outpatient dialysis chair and is stable to discharge from my perspective  Subjective:   Resting today with no complaints.  Tolerated dialysis yesterday with no issues   Objective:   BP (!) 98/52 (BP Location: Right Arm)   Pulse  67   Temp 98.4 F (36.9 C)   Resp 18   Ht '5\' 7"'$  (1.702 m)   Wt 63.1 kg   SpO2 99%   BMI 21.79 kg/m   Intake/Output Summary (Last 24 hours) at 04/24/2021 [id number] Last data filed at 04/23/2021 1600 Gross per 24 hour  Intake 240 ml  Output 2000 ml  Net -1760 ml    Weight change:   Physical Exam: GEN: NAD, lying in bed HEENT: No conjunctival pallor, EOMI LUNGS: Bilateral chest rise with no increased work of breathing CV: normal rate ABD: SNDNT +BS  EXT: No lower extremity edema ACCESS: RIJ TC   Imaging: No results found.  Labs: BMET Recent Labs  Lab 04/20/21 1718 04/21/21 1247 04/22/21 0143  NA 137 136  --   K 3.8 3.4*  --   CL 100 100  --   CO2 24 27  --   GLUCOSE 138* 101*  --   BUN 54* 31*  --   CREATININE 8.93* 5.75*  --   CALCIUM 9.0 8.4*  --   PHOS 2.4* 1.9* 1.9*   CBC Recent Labs  Lab 04/20/21 1718 04/21/21 1247  WBC 9.7 9.3  HGB 10.6* 11.2*  HCT 32.7* 34.4*  MCV 94.5 95.0  PLT 232 205    Medications:     sodium chloride   Intravenous Once   amLODipine  10 mg Oral Daily   atorvastatin  20 mg Oral Daily   carvedilol  6.25 mg Oral BID  WC   Chlorhexidine Gluconate Cloth  6 each Topical Q0600   feeding supplement (NEPRO CARB STEADY)  237 mL Oral Q24H   isosorbide mononitrate  30 mg Oral Daily   ketotifen  1 drop Both Eyes BID   melatonin  3 mg Oral QHS   memantine  5 mg Oral Daily   mometasone-formoterol  2 puff Inhalation BID   multivitamin  1 tablet Oral QHS   pantoprazole  40 mg Oral Q0600   umeclidinium bromide  1 puff Inhalation Daily      Reesa Chew  04/24/2021, 9:43 AM

## 2021-04-24 NOTE — Discharge Summary (Signed)
Physician Discharge Summary  Anthony [name] [id number] DOB: 1941/06/24 DOA: 04/16/2021  PCP: Isaac Bliss, Rayford Halsted, MD  Admit date: 04/16/2021 Discharge date: 04/24/2021  Admitted From: Home Disposition: Home  Recommendations for Outpatient Follow-up:  Follow up with PCP in 1 week Follow up with nephrology Follow up with infectious disease for positive quantiferon gold test Please follow up on the following pending results: None  Home Health: PT Equipment/Devices: None  Discharge Condition: Stable CODE STATUS: Full code Diet recommendation: Renal diet   Brief/Interim Summary:  Admission HPI written by Toy Baker, MD   HPI: Anthony [name] is a 80 y.o. male with medical history significant of ESRD, anemia vascular dementia Alcohol abuse, Anemia, Chronic heart failure with preserved ejection fraction  COPD, CVA, esophagitis, GIB, HTN, p A.fib, pulmonary hypertension, tobacco abuse, hx of positive PPD     Presented with   shortness of breath and need for HD Pt just moved from  [place], hx of ESRD on HD Tuesday Thursday Saturday. Needed HD today last hemodialysis was on Saturday and presented to dialysis center but was informed that he needs to be first established care with Nephrologist Hx of anemia in the past was admitted with hemoglobin down to 7.5 at that time had rectal bleeding was started on PPI and the plan was for him to be watched his hemoglobin improved and he was discharged home without any need for transfusion.  Currently denies any blood per rectum or black stools Gets dialyzed through a right chest HD catheter.  Patient has been having some shortness of breath worse for the past 3 days with wheezing and sputum production , no fever no CP similar to COPD exacerbations, at baseline not on O2  No chest pain no fever chills   Recent Covid infection June  2022 managed as outpt     Denies current EToH but reports he sill smokes daily    Apparently has been on recent steroids for COPD exacerbation    Hospital course:  Symptomatic anemia Hemoglobin of 6.5 on admission. Fecal occult blood test positive. Patient received 2 units of PRBC. GI consulted and performed a small bowel enteroscopy on 7/28 which was significant for two duodenal non-bleeding angiodysplastic lesions treated with APC. Recommendation to continue aspirin. If continued issues, recommendation for video capsule endoscopy followed by single balloon enteroscopy if needed.  COPD exacerbation Patient treated with azithromycin and Solu-medrol > prednisone with improvement. Completed treatment prior to discharge. Continue home Spiriva and Pulmicort.  Positive quantiferon test Incidental. Patient recommended for infectious disease follow-up as an outpatient. No symptoms/signs concerning for active TB.  ESRD on HD Patient set up with an HD clinic/nephrologist in this area prior to discharge.  Tobacco use Nicotine patch  Paroxysmal atrial fibrillation Continue Coreg  Primary hypertension Continue amlodipine, Coreg, hydralazine, isosorbide mononitrate  Chronic diastolic heart failure Fluid balance managed with hemodialysis  CAD Aspirin held on admission. Resumed on discharge.  Hyperlipidemia Continue  Lipitor  Prolonged QTc Noted. Recommend avoiding prolonging medications.  GERD Continue Protonix  Discharge Diagnoses:  Active Problems:   ESRD (end stage renal disease) on dialysis (HCC)   HTN (hypertension)   Chronic diastolic heart failure (HCC)   PAF (paroxysmal atrial fibrillation) (HCC)   Tobacco use disorder   Anemia   Symptomatic anemia   COPD (chronic obstructive pulmonary disease) (HCC)   CAD (coronary artery disease)   HLD (hyperlipidemia)   Prolonged QT interval   Abnormal ECG   Dementia without behavioral disturbance (  [place])   COPD with acute exacerbation ([place])   Occult blood positive stool   Malnutrition of moderate  degree    Discharge Instructions  Discharge Instructions     Diet - low sodium heart healthy   Complete by: As directed    Increase activity slowly   Complete by: As directed       Allergies as of 04/24/2021   No Known Allergies      Medication List     STOP taking these medications    predniSONE 20 MG tablet Commonly known as: DELTASONE       TAKE these medications    acetaminophen 500 MG tablet Commonly known as: TYLENOL Take 1,000 mg by mouth every 8 (eight) hours as needed for moderate pain.   Advair HFA 45-21 MCG/ACT inhaler Generic drug: fluticasone-salmeterol INHALE 2 PUFFS INTO THE LUNGS IN THE MORNING AND AT BEDTIME   albuterol 108 (90 Base) MCG/ACT inhaler Commonly known as: Proventil HFA Inhale 2 puffs into the lungs every 4 (four) hours as needed for wheezing or shortness of breath.   amLODipine 5 MG tablet Commonly known as: NORVASC Take 5 mg by mouth daily.   Aspirin Low Dose 81 MG chewable tablet Generic drug: aspirin Chew 81 mg by mouth daily.   atorvastatin 20 MG tablet Commonly known as: LIPITOR TAKE 1 TABLET BY MOUTH EVERYDAY AT BEDTIME What changed: See the new instructions.   budesonide-formoterol 160-4.5 MCG/ACT inhaler Commonly known as: SYMBICORT Inhale 2 puffs into the lungs 2 (two) times daily.   calcium acetate 667 MG capsule Commonly known as: PHOSLO Take 2,001 mg by mouth 3 (three) times daily with meals.   carboxymethylcellulose 0.5 % Soln Commonly known as: REFRESH PLUS Place 1 drop into both eyes daily as needed (dry eyes).   carvedilol 6.25 MG tablet Commonly known as: COREG Take 1 tablet (6.25 mg total) by mouth 2 (two) times daily with a meal.   hydrALAZINE 100 MG tablet Commonly known as: APRESOLINE Take 100 mg by mouth 2 (two) times daily.   isosorbide mononitrate 30 MG 24 hr tablet Commonly known as: IMDUR Take 30 mg by mouth daily.   ketotifen 0.025 % ophthalmic solution Commonly known as:  ZADITOR Place 1 drop into both eyes 2 (two) times daily.   memantine 5 MG tablet Commonly known as: NAMENDA Take 5 mg by mouth daily.   methocarbamol 500 MG tablet Commonly known as: Robaxin Take 1 tablet (500 mg total) by mouth every 8 (eight) hours as needed for muscle spasms.   nicotine 14 mg/24hr patch Commonly known as: NICODERM CQ - dosed in mg/24 hours Place 1 patch (14 mg total) onto the skin daily.   pantoprazole 40 MG tablet Commonly known as: PROTONIX Take 1 tablet (40 mg total) by mouth 2 (two) times daily. What changed: when to take this   Spiriva Respimat 2.5 MCG/ACT Aers Generic drug: Tiotropium Bromide Monohydrate Inhale 2 puffs into the lungs daily.   traZODone 50 MG tablet Commonly known as: DESYREL TAKE 1 TO 2 TABLETS AT BEDTIME AS NEEDED        Follow-up Information     Tommy Medal, Lavell Islam, MD Follow up in 1 week(s).   Specialty: Infectious Diseases Why: call for appintemnet Contact information: [address] [address] [phone]         [facility] Follow up.   Specialty: Home Health Services Why: Your home health has been set up with [facility]  [facility]. The office will call you with start of service info. If you have any questions please call number listed above. Contact information: [place] [phone]         Isaac Bliss, Rayford Halsted, MD. Schedule an appointment as soon as possible for a visit in 1 week(s).   Specialty: Internal Medicine Why: For hospital follow-up Contact information: [place] [address] [phone]                No Known Allergies  Consultations: Gastroenterology   Procedures/Studies: [facility] Chest Port 1 View  Result Date: 04/16/2021 CLINICAL DATA:  Shortness of breath. EXAM: PORTABLE CHEST 1 VIEW COMPARISON:  February 29, 2020. FINDINGS: The heart size and mediastinal contours are  within normal limits. Interval placement of right internal jugular dialysis catheter with distal tip in expected position of the SVC. No pneumothorax or pleural effusion is noted. Right lung is clear. Mild left basilar subsegmental atelectasis or scarring is noted. The visualized skeletal structures are unremarkable. IMPRESSION: Mild left basilar subsegmental atelectasis or scarring. Aortic Atherosclerosis (ICD10-I70.0). Electronically Signed   By: Marijo Conception M.D.   On: 04/16/2021 19:27   ECHOCARDIOGRAM COMPLETE  Result Date: 04/17/2021    ECHOCARDIOGRAM REPORT   Patient Name:   Anthony [name] Date of Exam: 04/17/2021 Medical Rec #:  [id number]      Height:       66.5 in Accession #:    [id number]     Weight:       137.4 lb Date of Birth:  [birth year]       BSA:          1.714 m Patient Age:    58 years       BP:           171/101 mmHg Patient Gender: M              HR:           65 bpm. Exam Location:  Inpatient Procedure: 2D Echo, 3D Echo, Color Doppler, Cardiac Doppler and Strain Analysis Indications:    R06.9 DOE  History:        Patient has prior history of Echocardiogram examinations, most                 recent 03/01/2020. CAD, COPD, Arrythmias:Atrial Fibrillation;                 Risk Factors:Hypertension and Dyslipidemia.  Sonographer:    Raquel Sarna Senior RDCS Referring Phys: [place]  Sonographer Comments: Poor parasternal window due to COPD IMPRESSIONS  1. Left ventricular ejection fraction, by estimation, is 50 to 55%. The left ventricle has low normal function. The left ventricle has no regional wall motion abnormalities. There is mild left ventricular hypertrophy. Left ventricular diastolic parameters are consistent with Grade II diastolic dysfunction (pseudonormalization). Elevated left atrial pressure.  2. Right ventricular systolic function is normal. The right ventricular size is normal. There is normal pulmonary artery systolic pressure. The estimated right ventricular systolic  pressure is 0000000 mmHg.  3. Left atrial size was moderately dilated.  4. The mitral valve is degenerative. Trivial mitral valve regurgitation. Mild mitral stenosis. The mean mitral valve gradient is 4.0 mmHg at HR 65bpm. MVA 1.9 cm^2 by coninuity equation.  5. The aortic valve was not well visualized. Aortic valve regurgitation is not visualized. No aortic stenosis is present.  6. The inferior vena cava  is normal in size with <50% respiratory variability, suggesting right atrial pressure of 8 mmHg. FINDINGS  Left Ventricle: Left ventricular ejection fraction, by estimation, is 50 to 55%. The left ventricle has low normal function. The left ventricle has no regional wall motion abnormalities. The left ventricular internal cavity size was normal in size. There is mild left ventricular hypertrophy. Left ventricular diastolic parameters are consistent with Grade II diastolic dysfunction (pseudonormalization). Elevated left atrial pressure. Right Ventricle: The right ventricular size is normal. No increase in right ventricular wall thickness. Right ventricular systolic function is normal. There is normal pulmonary artery systolic pressure. The tricuspid regurgitant velocity is 2.17 m/s, and  with an assumed right atrial pressure of 8 mmHg, the estimated right ventricular systolic pressure is 0000000 mmHg. Left Atrium: Left atrial size was moderately dilated. Right Atrium: Right atrial size was normal in size. Pericardium: There is no evidence of pericardial effusion. Mitral Valve: The mitral valve is degenerative in appearance. Trivial mitral valve regurgitation. Mild mitral valve stenosis. MV peak gradient, 7.1 mmHg. The mean mitral valve gradient is 4.0 mmHg. Tricuspid Valve: The tricuspid valve is normal in structure. Tricuspid valve regurgitation is trivial. Aortic Valve: The aortic valve was not well visualized. Aortic valve regurgitation is not visualized. No aortic stenosis is present. Pulmonic Valve: The pulmonic  valve was not well visualized. Pulmonic valve regurgitation is trivial. Aorta: The aortic root is normal in size and structure. Venous: The inferior vena cava is normal in size with less than 50% respiratory variability, suggesting right atrial pressure of 8 mmHg. IAS/Shunts: The interatrial septum was not well visualized.  LEFT VENTRICLE PLAX 2D LVIDd:         5.00 cm  Diastology LVIDs:         3.80 cm  LV e' medial:    3.81 cm/s LV PW:         1.10 cm  LV E/e' medial:  34.6 LV IVS:        1.10 cm  LV e' lateral:   6.20 cm/s LVOT diam:     2.00 cm  LV E/e' lateral: 21.3 LV SV:         80 LV SV Index:   47 LVOT Area:     3.14 cm                          3D Volume EF:                         3D EF:        48 %                         LV EDV:       181 ml                         LV ESV:       94 ml                         LV SV:        87 ml RIGHT VENTRICLE RV S prime:     13.80 cm/s TAPSE (M-mode): 2.4 cm LEFT ATRIUM             Index       RIGHT ATRIUM  Index LA diam:        4.90 cm 2.86 cm/m  RA Area:     15.80 cm LA Vol (A2C):   81.2 ml 47.36 ml/m RA Volume:   36.90 ml  21.52 ml/m LA Vol (A4C):   62.5 ml 36.45 ml/m LA Biplane Vol: 75.5 ml 44.04 ml/m  AORTIC VALVE LVOT Vmax:   117.00 cm/s LVOT Vmean:  67.100 cm/s LVOT VTI:    0.254 m  AORTA Ao Root diam: 3.10 cm MITRAL VALVE                TRICUSPID VALVE MV Area (PHT): 3.40 cm     TR Peak grad:   18.8 mmHg MV Area VTI:   1.88 cm     TR Vmax:        217.00 cm/s MV Peak grad:  7.1 mmHg MV Mean grad:  4.0 mmHg     SHUNTS MV Vmax:       1.33 m/s     Systemic VTI:  0.25 m MV Vmean:      101.0 cm/s   Systemic Diam: 2.00 cm MV Decel Time: 223 msec MV E velocity: 132.00 cm/s MV A velocity: 137.00 cm/s MV E/A ratio:  0.96 Oswaldo Milian MD Electronically signed by Oswaldo Milian MD Signature Date/Time: 04/17/2021/12:10:49 PM    Final    VAS [place] UPPER EXT VEIN MAPPING (PRE-OP AVF)  Result Date: 04/19/2021 UPPER EXTREMITY VEIN MAPPING Patient  Name:  Anthony [name]  Date of Exam:   04/18/2021 Medical Rec #: [id number]       Accession #:    [id number] Date of Birth: [birth year]        Patient Gender: M Patient Age:   79Y Exam Location:  [facility] Procedure:      VAS [place] UPPER EXT VEIN MAPPING (PRE-OP AVF) Referring Phys: [id number] Hunt Oris LIN --------------------------------------------------------------------------------  Indications: Pre-access. Performing Technologist: Rogelia Rohrer RVT, RDMS  Examination Guidelines: A complete evaluation includes B-mode imaging, spectral Doppler, color Doppler, and power Doppler as needed of all accessible portions of each vessel. Bilateral testing is considered an integral part of a complete examination. Limited examinations for reoccurring indications may be performed as noted. +-----------------+-------------+----------+--------------+ Right Cephalic   Diameter (cm)Depth (cm)   Findings    +-----------------+-------------+----------+--------------+ Shoulder             0.14        0.29                  +-----------------+-------------+----------+--------------+ Prox upper arm       0.09        0.25                  +-----------------+-------------+----------+--------------+ Mid upper arm        0.09        0.14                  +-----------------+-------------+----------+--------------+ Dist upper arm       0.11        0.24                  +-----------------+-------------+----------+--------------+ Antecubital fossa    0.08        0.10                  +-----------------+-------------+----------+--------------+ Prox forearm  not visualized +-----------------+-------------+----------+--------------+ Mid forearm                             not visualized +-----------------+-------------+----------+--------------+ Dist forearm                            not visualized +-----------------+-------------+----------+--------------+ Wrist                                    not visualized +-----------------+-------------+----------+--------------+ +-----------------+-------------+----------+--------------+ Right Basilic    Diameter (cm)Depth (cm)   Findings    +-----------------+-------------+----------+--------------+ Prox upper arm       0.16                 thrombosed   +-----------------+-------------+----------+--------------+ Mid upper arm        0.18                 thrombosed   +-----------------+-------------+----------+--------------+ Dist upper arm       0.20                 thrombosed   +-----------------+-------------+----------+--------------+ Antecubital fossa    0.18                 thrombosed   +-----------------+-------------+----------+--------------+ Prox forearm                            not visualized +-----------------+-------------+----------+--------------+ Mid forearm                             not visualized +-----------------+-------------+----------+--------------+ Distal forearm                          not visualized +-----------------+-------------+----------+--------------+ Elbow                                   not visualized +-----------------+-------------+----------+--------------+ Wrist                                   not visualized +-----------------+-------------+----------+--------------+ Summary: Right: RUE - SVT in basilic vein from origin to [facility] fossa. Unable to        visualize past AC fossa.  *See table(s) above for measurements and observations.  Diagnosing physician: Deitra Mayo MD Electronically signed by Deitra Mayo MD on 04/19/2021 at 7:54:07 AM.    Final     SMALL BOWEL ENTEROSCOPY (04/18/2021) Impression:               - Small hiatal hernia.                           - Two non-bleeding angiodysplastic lesions in the                           duodenum. Treated with argon beam coagulation.                           - Three non-bleeding  angiodysplastic lesions in the  duodenum. Treated with argon plasma coagulation                           (APC).                           - No specimens collected. Recommendation:           - Return patient to hospital ward for ongoing care.                           - Resume previous diet.                           - Trend CBC.                           - If continued problems, VCE as outpatient followed                           by single balloon enteroscopy if needed.                           - The findings and recommendations were discussed                           with the patient's family.  Subjective: No concerns today.  Discharge Exam: Vitals:   04/24/21 0714 04/24/21 1226  BP:  (!) 110/49  Pulse:  63  Resp:  19  Temp:  97.6 F (36.4 C)  SpO2: 99% 100%   Vitals:   04/23/21 2012 04/24/21 0439 04/24/21 0714 04/24/21 1226  BP:  (!) 98/52  (!) 110/49  Pulse:  67  63  Resp:  18  19  Temp:  98.4 F (36.9 C)  97.6 F (36.4 C)  TempSrc:    Oral  SpO2: 95% 99% 99% 100%  Weight:      Height:        General: Pt is alert, awake, not in acute distress Cardiovascular: RRR, S1/S2 +, no rubs, no gallops Respiratory: CTA bilaterally, no wheezing, no rhonchi Abdominal: Soft, NT, ND, bowel sounds + Extremities: no edema, no cyanosis    The results of significant diagnostics from this hospitalization (including imaging, microbiology, ancillary and laboratory) are listed below for reference.     Microbiology: Recent Results (from the past 240 hour(s))  SARS CORONAVIRUS 2 (TAT 6-24 HRS) Nasopharyngeal Nasopharyngeal Swab     Status: Abnormal   Collection Time: 04/16/21  7:06 PM   Specimen: Nasopharyngeal Swab  Result Value Ref Range Status   SARS Coronavirus 2 POSITIVE (A) NEGATIVE Final    Comment: (NOTE) SARS-CoV-2 target nucleic acids are DETECTED.  The SARS-CoV-2 RNA is generally detectable in upper and lower respiratory specimens during  the acute phase of infection. Positive results are indicative of the presence of SARS-CoV-2 RNA. Clinical correlation with patient history and other diagnostic information is  necessary to determine patient infection status. Positive results do not rule out bacterial infection or co-infection with other viruses.  The expected result is Negative.  Fact Sheet for Patients: SugarRoll.be  Fact Sheet for Healthcare Providers: https://www.woods-mathews.com/  This test is not yet approved or cleared by the [place] FDA  and  has been authorized for detection and/or diagnosis of SARS-CoV-2 by FDA under an Emergency Use Authorization (EUA). This EUA will remain  in effect (meaning this test can be used) for the duration of the COVID-19 declaration under Section 564(b)(1) of the Act, 21 U. S.C. section 360bbb-3(b)(1), unless the authorization is terminated or revoked sooner.   Performed at [facility], [place] [address], [place], [place] [address]      Labs: BNP (last 3 results) No results for input(s): BNP in the last 8760 hours. Basic Metabolic Panel: Recent Labs  Lab 04/20/21 1718 04/21/21 1247 04/22/21 0143  NA 137 136  --   K 3.8 3.4*  --   CL 100 100  --   CO2 24 27  --   GLUCOSE 138* 101*  --   BUN 54* 31*  --   CREATININE 8.93* 5.75*  --   CALCIUM 9.0 8.4*  --   PHOS 2.4* 1.9* 1.9*   Liver Function Tests: Recent Labs  Lab 04/20/21 1718 04/21/21 1247  ALBUMIN 3.4* 3.3*   No results for input(s): LIPASE, AMYLASE in the last 168 hours. No results for input(s): AMMONIA in the last 168 hours. CBC: Recent Labs  Lab 04/20/21 1718 04/21/21 1247  WBC 9.7 9.3  HGB 10.6* 11.2*  HCT 32.7* 34.4*  MCV 94.5 95.0  PLT 232 205   Microbiology Recent Results (from the past 240 hour(s))  SARS CORONAVIRUS 2 (TAT 6-24 HRS) Nasopharyngeal Nasopharyngeal Swab     Status: Abnormal   Collection Time: 04/16/21  7:06 PM    Specimen: Nasopharyngeal Swab  Result Value Ref Range Status   SARS Coronavirus 2 POSITIVE (A) NEGATIVE Final    Comment: (NOTE) SARS-CoV-2 target nucleic acids are DETECTED.  The SARS-CoV-2 RNA is generally detectable in upper and lower respiratory specimens during the acute phase of infection. Positive results are indicative of the presence of SARS-CoV-2 RNA. Clinical correlation with patient history and other diagnostic information is  necessary to determine patient infection status. Positive results do not rule out bacterial infection or co-infection with other viruses.  The expected result is Negative.  Fact Sheet for Patients: SugarRoll.be  Fact Sheet for Healthcare Providers: https://www.woods-mathews.com/  This test is not yet approved or cleared by the [place] FDA and  has been authorized for detection and/or diagnosis of SARS-CoV-2 by FDA under an Emergency Use Authorization (EUA). This EUA will remain  in effect (meaning this test can be used) for the duration of the COVID-19 declaration under Section 564(b)(1) of the Act, 21 U. S.C. section 360bbb-3(b)(1), unless the authorization is terminated or revoked sooner.   Performed at [facility], [place] [address], [place], [place] [address]      Time coordinating discharge: 35 minutes  SIGNED:   Cordelia Poche, MD [facility] 04/24/2021, 2:14 PM

## 2021-04-24 NOTE — Progress Notes (Signed)
Occupational Therapy Treatment Patient Details Name: Anthony [name] MRN: [id number] DOB: July 03, 1941 Today's Date: 04/24/2021    History of present illness 80 y.o. male presenting to ED 7/26 with SOB after missing dialysis. Patient recently moved to [place] from [place] and is trying to get set up with PCP and dialysis center. Patient also reporting rectal bleeding. Patient admitted with symptomatic anemia and COPD with acute exacerbation. PMHx significant for ESRD on HD T,Th, Sat prior to move, anemia, vascular dementia, ETOH use disorder, anemia, CHF, COPD, CVA, GIB, HTN, A-fib, HTN, tobacco use disorder, and Hx of positive PPD.   OT comments  OT treatment session with focus on self-care re-eduction with utilization of energy conservation techniques (education provided verbally and in writing). Patient with 3/4 DOE this date with simple UB/LB dressing. VSS on RA. Education on pursed lip breathing and taking rest breaks as needed. Patient expressed verbal understanding.    Follow Up Recommendations  No OT follow up    Equipment Recommendations  None recommended by OT    Recommendations for Other Services      Precautions / Restrictions Precautions Precautions: Fall Restrictions Weight Bearing Restrictions: No       Mobility Bed Mobility Overal bed mobility: Independent                  Transfers Overall transfer level: Needs assistance Equipment used: None Transfers: Sit to/from Stand Sit to Stand: Supervision         General transfer comment: Min guard for safety.    Balance Overall balance assessment: Needs assistance Sitting-balance support: Feet supported;No upper extremity supported Sitting balance-Leahy Scale: Good     Standing balance support: During functional activity;No upper extremity supported Standing balance-Leahy Scale: Poor Standing balance comment: Reliant on BUE on RW.                           ADL either performed or  assessed with clinical judgement   ADL Overall ADL's : Needs assistance/impaired                 Upper Body Dressing : Set up;Sitting Upper Body Dressing Details (indicate cue type and reason): Donned UB clothing seated EOB. Lower Body Dressing: Supervision/safety Lower Body Dressing Details (indicate cue type and reason): Supervision A to don pants in sitting/standing; DOE 3/4               General ADL Comments: 3/4 DOE with mild activity, poor activity pacing and balance deficits.     Vision       Perception     Praxis      Cognition Arousal/Alertness: Awake/alert Behavior During Therapy: WFL for tasks assessed/performed Overall Cognitive Status: Within Functional Limits for tasks assessed                                 General Comments: Vascular dementia in hx in chart        Exercises     Shoulder Instructions       General Comments      Pertinent Vitals/ Pain       Pain Assessment: No/denies pain  Home Living                                          Prior  Functioning/Environment              Frequency  Min 2X/week        Progress Toward Goals  OT Goals(current goals can now be found in the care plan section)     Acute Rehab OT Goals Patient Stated Goal: To return home. OT Goal Formulation: With patient Time For Goal Achievement: 05/01/21 Potential to Achieve Goals: Good ADL Goals Additional ADL Goal #1: Patient will complete ADLs with I in prep for safe d/c home. Additional ADL Goal #2: Patient will recall 3 energy conservation techniques in prep for ADLs/IADLs. Additional ADL Goal #3: Patient will tolerate 15 minutes of therapeutic activity without need for rest break indicating improved activity tolerance.  Plan Discharge plan remains appropriate;Frequency remains appropriate    Co-evaluation                 AM-PAC OT "6 Clicks" Daily Activity     Outcome Measure   Help from  another person eating meals?: None Help from another person taking care of personal grooming?: A Little Help from another person toileting, which includes using toliet, bedpan, or urinal?: A Little Help from another person bathing (including washing, rinsing, drying)?: A Little Help from another person to put on and taking off regular upper body clothing?: None Help from another person to put on and taking off regular lower body clothing?: A Little 6 Click Score: 20    End of Session Equipment Utilized During Treatment: Gait belt;Rolling walker  OT Visit Diagnosis: Muscle weakness (generalized) (M62.81)   Activity Tolerance Patient tolerated treatment well;Patient limited by fatigue   Patient Left with call bell/phone within reach;in chair;with chair alarm set   Nurse Communication Mobility status;Other (comment) (Response to treatment.)        Time: [id number] OT Time Calculation (min): 17 min  Charges: OT General Charges $OT Visit: 1 Visit OT Treatments $Self Care/Home Management : 8-22 mins  Anthony [name] H. OTR/L Supplemental OT, Department of rehab services [phone]   Anthony [name] R H. 04/24/2021, 2:24 PM

## 2021-04-24 NOTE — Plan of Care (Signed)

## 2021-04-24 NOTE — Discharge Instructions (Addendum)
Anthony [name],  You were in the hospital in need of establishing hemodialysis. You also had GI bleeding which was treated and has resolved. Please follow-up with your new nephrology group. While you were here, you had a positive TB test and have been recommended to follow-up with infectious disease.

## 2021-04-25 ENCOUNTER — Telehealth: Payer: Self-pay | Admitting: Nephrology

## 2021-04-25 ENCOUNTER — Other Ambulatory Visit: Payer: Self-pay

## 2021-04-25 DIAGNOSIS — J441 Chronic obstructive pulmonary disease with (acute) exacerbation: Secondary | ICD-10-CM

## 2021-04-25 DIAGNOSIS — N186 End stage renal disease: Secondary | ICD-10-CM

## 2021-04-25 DIAGNOSIS — I160 Hypertensive urgency: Secondary | ICD-10-CM

## 2021-04-25 NOTE — Telephone Encounter (Signed)
Transition of care contact from inpatient facility  Date of Discharge: 04/24/21 Date of Contact: 04/25/21 Method of contact: Phone -attempt   Attempted to contact patient to discuss transition of care from inpatient admission. Patient did not answer the phone. Message was left on the patient's voicemail.

## 2021-04-25 NOTE — Patient Outreach (Signed)
[facility]) Care Management  04/25/2021  Anthony [name] [birth year] [id number]   [place] [facility] [ACO] Patient: Medicare CMS DCE  Patient was screened for [facility] Management services. Patient will have the transition of care call conducted by the primary care provider. This patient is listed with an Embedded practice which has a chronic disease management Embedded Care Management team.  Plan: Notification sent to the [place] guide for Embedded Care Management TOC follow up.   Please contact for further questions,  Natividad Brood, RN BSN [facility] Liaison  [phone] business mobile phone Toll free office [phone]  Fax number: [phone] [email or web address]

## 2021-04-29 ENCOUNTER — Telehealth: Payer: Self-pay | Admitting: Internal Medicine

## 2021-04-29 NOTE — Telephone Encounter (Signed)
Anthony [name], [phone], called from [facility] to placed verbal orders for:  To continue Physical Therapy for 2 times a week for 3 weeks then to do Physical Therapy for 1 time a week for 1 week

## 2021-04-30 NOTE — Telephone Encounter (Signed)
Left detailed message on confidential voicemail for [facility] with verbal orders for PT.

## 2021-05-06 ENCOUNTER — Other Ambulatory Visit: Payer: Self-pay

## 2021-05-07 ENCOUNTER — Ambulatory Visit (INDEPENDENT_AMBULATORY_CARE_PROVIDER_SITE_OTHER): Payer: Medicare Other | Admitting: Internal Medicine

## 2021-05-07 ENCOUNTER — Encounter: Payer: Self-pay | Admitting: Internal Medicine

## 2021-05-07 VITALS — BP 130/70 | HR 74 | Temp 97.4°F | Wt 145.0 lb

## 2021-05-07 DIAGNOSIS — F17200 Nicotine dependence, unspecified, uncomplicated: Secondary | ICD-10-CM | POA: Diagnosis not present

## 2021-05-07 DIAGNOSIS — I10 Essential (primary) hypertension: Secondary | ICD-10-CM | POA: Diagnosis not present

## 2021-05-07 DIAGNOSIS — F0390 Unspecified dementia without behavioral disturbance: Secondary | ICD-10-CM

## 2021-05-07 DIAGNOSIS — J439 Emphysema, unspecified: Secondary | ICD-10-CM

## 2021-05-07 DIAGNOSIS — Z992 Dependence on renal dialysis: Secondary | ICD-10-CM

## 2021-05-07 DIAGNOSIS — I4891 Unspecified atrial fibrillation: Secondary | ICD-10-CM

## 2021-05-07 DIAGNOSIS — K219 Gastro-esophageal reflux disease without esophagitis: Secondary | ICD-10-CM

## 2021-05-07 DIAGNOSIS — N186 End stage renal disease: Secondary | ICD-10-CM | POA: Diagnosis not present

## 2021-05-07 DIAGNOSIS — R7612 Nonspecific reaction to cell mediated immunity measurement of gamma interferon antigen response without active tuberculosis: Secondary | ICD-10-CM

## 2021-05-07 DIAGNOSIS — D638 Anemia in other chronic diseases classified elsewhere: Secondary | ICD-10-CM

## 2021-05-07 DIAGNOSIS — I480 Paroxysmal atrial fibrillation: Secondary | ICD-10-CM

## 2021-05-07 NOTE — Progress Notes (Signed)
Established Patient Office Visit     This visit occurred during the SARS-CoV-2 public health emergency.  Safety protocols were in place, including screening questions prior to the visit, additional usage of staff PPE, and extensive cleaning of exam room while observing appropriate contact time as indicated for disinfecting solutions.    CC/Reason for Visit: Hospital discharge follow-up and follow-up chronic medical conditions  HPI: Akim Steger is a 80 y.o. male who is coming in today for the above mentioned reasons.  I have not seen him since June 2021.  He had apparently moved to [facility] and is now back.  He was hospitalized from July 26 through August 3 due to acute hypoxemic respiratory failure due to missed dialysis sessions.  He has a very complex past medical history significant for end-stage renal disease, COPD, heart failure with preserved ejection fraction as well as coronary artery disease, hyperlipidemia, atrial fibrillation and GERD.  There also is a mention of dementia in his chart and he is prescribed Namenda.  He tells me that while in [place] he was diagnosed with a "hole in his stomach".  He does not have any surgical scars.  He states that this was diagnosed through an endoscopy, when I questioned if he was told he had an ulcer he is not sure.  He is on daily PPI therapy.  He is scheduled for permanent dialysis access later this month.  He is now dialyzing through a right tunneled catheter.  While in the hospital he was noted to have a positive QuantiFERON gold test.  He continues to smoke about a third to half pack a day.  Past Medical/Surgical History: Past Medical History:  Diagnosis Date   Alcohol abuse    Arthritis    CHF (congestive heart failure) (HCC)    Chicken pox    Chronic kidney disease    ESRD (end stage renal disease) on dialysis ([place])    GI bleed    Hypertension    Insomnia    Stroke [facility])     Past Surgical History:   Procedure Laterality Date   ENTEROSCOPY N/A 04/18/2021   Procedure: ENTEROSCOPY;  Surgeon: Jackquline Denmark, MD;  Location: [facility] ENDOSCOPY;  Service: Endoscopy;  Laterality: N/A;   HOT HEMOSTASIS N/A 04/18/2021   Procedure: HOT HEMOSTASIS (ARGON PLASMA COAGULATION/BICAP);  Surgeon: Jackquline Denmark, MD;  Location[facility] ENDOSCOPY;  Service: Endoscopy;  Laterality: N/A;   SUBMUCOSAL TATTOO INJECTION  04/18/2021   Procedure: SUBMUCOSAL TATTOO INJECTION;  Surgeon: Jackquline Denmark, MD;  Location: [facility] ENDOSCOPY;  Service: Endoscopy;;    Social History:  reports that he has been smoking. He has never used smokeless tobacco. He reports that he does not currently use alcohol. He reports that he does not currently use drugs.  Allergies: No Known Allergies  Family History:  Family History  Problem Relation Age of Onset   Hypertension Mother    Hypertension Father      Current Outpatient Medications:    acetaminophen (TYLENOL) 500 MG tablet, Take 1,000 mg by mouth every 8 (eight) hours as needed for moderate pain., Disp: , Rfl:    ADVAIR HFA 45-21 MCG/ACT inhaler, INHALE 2 PUFFS INTO THE LUNGS IN THE MORNING AND AT BEDTIME, Disp: 36 each, Rfl: 2   albuterol (PROVENTIL HFA) 108 (90 Base) MCG/ACT inhaler, Inhale 2 puffs into the lungs every 4 (four) hours as needed for wheezing or shortness of breath., Disp: 18 g, Rfl: 0   amLODipine (NORVASC) 5 MG tablet,  Take 5 mg by mouth daily., Disp: , Rfl:    ASPIRIN LOW DOSE 81 MG chewable tablet, Chew 81 mg by mouth daily., Disp: , Rfl:    atorvastatin (LIPITOR) 20 MG tablet, TAKE 1 TABLET BY MOUTH EVERYDAY AT BEDTIME, Disp: 90 tablet, Rfl: 1   budesonide-formoterol (SYMBICORT) 160-4.5 MCG/ACT inhaler, Inhale 2 puffs into the lungs 2 (two) times daily., Disp: , Rfl:    calcium acetate (PHOSLO) 667 MG capsule, Take 2,001 mg by mouth 3 (three) times daily with meals., Disp: , Rfl:    carboxymethylcellulose (REFRESH PLUS) 0.5 % SOLN, Place 1 drop into both eyes daily as  needed (dry eyes)., Disp: , Rfl:    carvedilol (COREG) 6.25 MG tablet, Take 1 tablet (6.25 mg total) by mouth 2 (two) times daily with a meal., Disp: 60 tablet, Rfl: 0   hydrALAZINE (APRESOLINE) 100 MG tablet, Take 100 mg by mouth 2 (two) times daily., Disp: , Rfl:    isosorbide mononitrate (IMDUR) 30 MG 24 hr tablet, Take 30 mg by mouth daily., Disp: , Rfl:    ketotifen (ZADITOR) 0.025 % ophthalmic solution, Place 1 drop into both eyes 2 (two) times daily., Disp: , Rfl:    memantine (NAMENDA) 5 MG tablet, Take 5 mg by mouth daily., Disp: , Rfl:    methocarbamol (ROBAXIN) 500 MG tablet, Take 1 tablet (500 mg total) by mouth every 8 (eight) hours as needed for muscle spasms., Disp: 20 tablet, Rfl: 0   nicotine (NICODERM CQ - DOSED IN MG/24 HOURS) 14 mg/24hr patch, Place 1 patch (14 mg total) onto the skin daily., Disp: 90 patch, Rfl: 1   pantoprazole (PROTONIX) 40 MG tablet, Take 1 tablet (40 mg total) by mouth daily., Disp: , Rfl:    Tiotropium Bromide Monohydrate (SPIRIVA RESPIMAT) 2.5 MCG/ACT AERS, Inhale 2 puffs into the lungs daily., Disp: 4 g, Rfl: 3   traZODone (DESYREL) 50 MG tablet, TAKE 1 TO 2 TABLETS AT BEDTIME AS NEEDED, Disp: 45 tablet, Rfl: 1  Review of Systems:  Constitutional: Denies fever, chills, diaphoresis, appetite change and fatigue.  HEENT: Denies photophobia, eye pain, redness, hearing loss, ear pain, congestion, sore throat, rhinorrhea, sneezing, mouth sores, trouble swallowing, neck pain, neck stiffness and tinnitus.   Respiratory: Denies SOB, DOE, cough, chest tightness,  and wheezing.   Cardiovascular: Denies chest pain, palpitations and leg swelling.  Gastrointestinal: Denies nausea, vomiting, abdominal pain, diarrhea, constipation, blood in stool and abdominal distention.  Genitourinary: Denies dysuria, urgency, frequency, hematuria, flank pain and difficulty urinating.  Endocrine: Denies: hot or cold intolerance, sweats, changes in hair or nails, polyuria,  polydipsia. Musculoskeletal: Denies myalgias, back pain, joint swelling, arthralgias and gait problem.  Skin: Denies pallor, rash and wound.  Neurological: Denies dizziness, seizures, syncope, weakness, light-headedness, numbness and headaches.  Hematological: Denies adenopathy. Easy bruising, personal or family bleeding history  Psychiatric/Behavioral: Denies suicidal ideation, mood changes, confusion, nervousness, sleep disturbance and agitation    Physical Exam: Vitals:   05/07/21 1337  BP: 130/70  Pulse: 74  Temp: (!) 97.4 F (36.3 C)  TempSrc: Oral  SpO2: 95%  Weight: 145 lb (65.8 kg)    Body mass index is 22.71 kg/m.   Constitutional: NAD, calm, comfortable Eyes: PERRL, lids and conjunctivae normal, wears corrective lenses ENMT: Mucous membranes are moist.  Respiratory: clear to auscultation bilaterally, no wheezing, no crackles. Normal respiratory effort. No accessory muscle use.  Cardiovascular: Regular rate and rhythm, no murmurs / rubs / gallops. No extremity edema.  Psychiatric:  Alert and oriented x 3. Normal mood.    Impression and Plan:  ESRD (end stage renal disease) on dialysis [facility]) -On a Monday, Wednesday, Friday dialysis schedule, followed by local nephrology.  Tobacco use disorder -We have not had time to discuss smoking cessation today.  Pulmonary emphysema, unspecified emphysema type ([place])  - Plan: Ambulatory referral to Pulmonology  Primary hypertension -Blood pressure with fair control today.  Anemia of chronic disease -Due to end-stage renal disease, he did receive PRBC transfusion during this hospitalization due to hemoglobin of 6.5.  Dementia without behavioral disturbance, unspecified dementia type ([place]) -On Namenda.  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  Gastroesophageal reflux disease, unspecified whether esophagitis  present  - Plan: Ambulatory referral to Gastroenterology -He tells me that he is due to have a repeat endoscopy for his "hole in his stomach".  I wonder if this was follow-up for peptic ulcer disease. -Continue daily Protonix.  Atrial fibrillation, unspecified type [facility])  - Plan: Ambulatory referral to Cardiology -He is rate controlled today, he is not anticoagulated.  Time spent: 38 minutes reviewing chart, interviewing patient and wife, examining patient and formulating plan of care.   Patient Instructions  -Nice seeing you today!!  -Schedule follow up in 3 months.    Lelon Frohlich, MD [facility]

## 2021-05-07 NOTE — Patient Instructions (Signed)
-  Nice seeing you today!!  -Schedule follow up in 3 months. 

## 2021-05-09 ENCOUNTER — Telehealth: Payer: Self-pay | Admitting: *Deleted

## 2021-05-09 NOTE — Chronic Care Management (AMB) (Signed)
  Care Management   Outreach Note  05/09/2021 Name: Anthony [name] MRN: [id number] DOB: [birth year]  Referred by: Isaac Bliss, Rayford Halsted, MD Reason for referral : Care Coordination (Initial outreach to schedule referral with [facility])   An unsuccessful telephone outreach was attempted today. The patient was referred to the case management team for assistance with care management and care coordination.   Follow Up Plan:  The care management team will reach out to the patient again over the next 7 days.  If patient returns call to provider office, please advise to call [facility] at [phone].  [facility] Management  Direct Dial: [phone]

## 2021-05-13 ENCOUNTER — Telehealth: Payer: Self-pay

## 2021-05-13 ENCOUNTER — Encounter (HOSPITAL_COMMUNITY): Payer: Self-pay | Admitting: Vascular Surgery

## 2021-05-13 NOTE — Telephone Encounter (Signed)
Stanton Kidney from [facility] called to report patient falling downstairs no broken bones, no pain patient fell on bottom happened 8/21'@5pm'$  call back # [phone]

## 2021-05-13 NOTE — Progress Notes (Signed)
Anesthesia Chart Review: Same day workup  Pertinent history includes ESRD on HD Monday Wednesday Friday, HFpEF, paroxysmal A. Fib not on anticoagulation due to history of GI bleeds, CVA, prior history of EtOH abuse, vascular dementia, current smoker.  Recent admission at [facility] in [facility] for atypical chest pain and hypertensive emergency. Per discharge summary 03/02/21, "Patient presented to ED after 2-3 days of chest pain that was occurin at rest was midline and substernal. Patient's pain was relieved with nitroglycerin. Patient had an abnormal dobutamine stress test 12/28/20 for workup of dialysis access; cardiology was panning for outpatient LHC with angiogram. Chest pain likely secondary to hypertensive urgency and renal history; troponins were elevated in the ED and peaked at 0.15 which is similar to previous admissions; EKG without significant changes from previous. 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 Cardiology recommended medical management, starting IMDUR and Ranexa for angina symptoms and will follow outpatient. Patient discharged home on Coreg 6.'25mg'$  BID, Norvasc '10mg'$  daily, Hydralazine '50mg'$  TID, IMDUR '30mg'$  daily, Ranexa '500mg'$  daily, ASA, and Lipitor."  Recent admission at [facility] in July-August 2022 for symptomatic anemia and COPD exacerbation.  Hemoglobin 6.5 on admission, FOBT positive, received 2 units PRBC. GI consulted and performed a small bowel enteroscopy on 7/28 which was significant for two duodenal non-bleeding angiodysplastic lesions treated with APC.  COPD exacerbation treated with antibiotics and steroids, completed treatment prior to discharge, discharged on home Spiriva and Pulmicort.  Echo showed EF 50 to 55%, normal wall motion,  grade 2 DD, mild mitral stenosis.  He was found to have incidentally positive QuantiFERON gold test, chest x-ray was negative for pulmonary tuberculosis.  He has pending referral to infectious disease for further evaluation.  Patient will need day of surgery labs and evaluation.  EKG 04/17/2021: Sinus rhythm.  Rate 74. Left axis deviation. Abnormal T, consider ischemia, lateral leads  TTE 04/17/2021:  1. Left ventricular ejection fraction, by estimation, is 50 to 55%. The  left ventricle has low normal function. The left ventricle has no regional  wall motion abnormalities. There is mild left ventricular hypertrophy.  Left ventricular diastolic  parameters are consistent with Grade II diastolic dysfunction  (pseudonormalization). Elevated left atrial pressure.   2. Right ventricular systolic function is normal. The right ventricular  size is normal. There is normal pulmonary artery systolic pressure. The  estimated right ventricular systolic pressure is 0000000 mmHg.   3. Left atrial size was moderately dilated.   4. The mitral valve is degenerative. Trivial mitral valve regurgitation.  Mild mitral stenosis. The mean mitral valve gradient is 4.0 mmHg at HR  65bpm. MVA 1.9 cm^2 by coninuity equation.   5. The aortic valve was not well visualized. Aortic valve regurgitation  is not visualized. No aortic stenosis is present.   6. The inferior vena cava is normal in size with <50% respiratory  variability, suggesting right atrial pressure of 8 mmHg.   Cath 03/04/21 (care everywhere): IMPRESSION: 1. Ostial LAD has a lesions with a DFR of 0.90 suggestive that is not significant flow limiting.  2. Proximal PDA 50% lesion.   RECOMMENDATIONS: 1. Continue aggressive risk factor modifications.     Wynonia Musty [facility] Short Stay Center/Anesthesiology Phone [phone] 05/13/2021 1:57 PM

## 2021-05-13 NOTE — Progress Notes (Signed)
DUE TO COVID-19 ONLY ONE VISITOR IS ALLOWED TO COME WITH YOU AND STAY IN THE WAITING ROOM ONLY DURING PRE OP AND PROCEDURE DAY OF SURGERY.   PCP - Dr Isaac Bliss  Cardiologist - none  Chest x-ray - 04/16/21 (1V) EKG - 04/17/21 Stress Test - 03/13/20 ECHO - 04/17/21 Cardiac Cath - n/a  Sleep Study -  n/a CPAP - none  Anesthesia review: Yes  STOP now taking any Aspirin (unless otherwise instructed by your surgeon), Aleve, Naproxen, Ibuprofen, Motrin, Advil, Goody's, BC's, all herbal medications, fish oil, and all vitamins.   Coronavirus Screening Covid test was positive on 04/16/21.  Do you have any of the following symptoms:  Cough yes/no: No Fever (>100.33F)  yes/no: No Runny nose yes/no: No Sore throat yes/no: No Difficulty breathing/shortness of breath  yes/no: No  Have you traveled in the last 14 days and where? yes/no: No  PatientWife verbalized understanding of instructions that were given via phone.

## 2021-05-13 NOTE — Telephone Encounter (Signed)
Spoke with Stanton Kidney and she stated the patient is doing fine, she practiced walking with him and he denies any pain at this time.  Message sent to PCP.

## 2021-05-13 NOTE — Anesthesia Preprocedure Evaluation (Addendum)
Anesthesia Evaluation  Patient identified by MRN, date of birth, ID band Patient awake    Reviewed: Allergy & Precautions, H&P , NPO status , Patient's Chart, lab work & pertinent test results  Airway Mallampati: II   Neck ROM: full    Dental   Pulmonary COPD, Current Smoker and Patient abstained from smoking.,    breath sounds clear to auscultation       Cardiovascular hypertension, + CAD and +CHF   Rhythm:regular Rate:Normal     Neuro/Psych PSYCHIATRIC DISORDERS Dementia CVA    GI/Hepatic Recent GI bleed   Endo/Other    Renal/GU ESRF and DialysisRenal disease     Musculoskeletal  (+) Arthritis ,   Abdominal   Peds  Hematology  (+) Blood dyscrasia, anemia , hgb 7.1   Anesthesia Other Findings   Reproductive/Obstetrics                            Anesthesia Physical Anesthesia Plan  ASA: 4  Anesthesia Plan: MAC and Regional   Post-op Pain Management:    Induction: Intravenous  PONV Risk Score and Plan: 0 and Propofol infusion and Treatment may vary due to age or medical condition  Airway Management Planned: Simple Face Mask  Additional Equipment:   Intra-op Plan:   Post-operative Plan:   Informed Consent: I have reviewed the patients History and Physical, chart, labs and discussed the procedure including the risks, benefits and alternatives for the proposed anesthesia with the patient or authorized representative who has indicated his/her understanding and acceptance.     Dental advisory given  Plan Discussed with: CRNA, Anesthesiologist and Surgeon  Anesthesia Plan Comments: (PAT note by Karoline Caldwell, PA-C: Pertinent history includes ESRD on HD Monday Wednesday Friday, HFpEF, paroxysmal A. Fib not on anticoagulation due to history of GI bleeds, CVA, prior history of EtOH abuse, vascular dementia, current smoker.  Recent admission at [facility] in [facility] for atypical chest pain and hypertensive emergency. Per discharge summary 03/02/21, "Patient presented to ED after 2-3 days of chest pain that was occurin at rest was midline and substernal. Patient's pain was relieved with nitroglycerin. Patient had an abnormal dobutamine stress test 12/28/20 for workup of dialysis access; cardiology was panning for outpatient LHC with angiogram. Chest pain likely secondary to hypertensive urgency and renal history; troponins were elevated in the ED and peaked at 0.15 which is similar to previous admissions; EKG without significant changes from previous. 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 Cardiology recommended medical management, starting IMDUR and Ranexa for angina symptoms and will follow outpatient. Patient discharged home on Coreg 6.25m BID, Norvasc 127mdaily, Hydralazine 5077mID, IMDUR 51m96mily, Ranexa 500mg61mly, ASA, and Lipitor."  [facility] symptomatic anemia and COPD exacerbation.  Hemoglobin 6.5 on admission, FOBT positive, received 2 units PRBC. GI consulted and performed a small bowel enteroscopy on 7/28 which was significant for two duodenal non-bleeding angiodysplastic lesions treated with APC.  COPD exacerbation treated with antibiotics and steroids, completed treatment prior to discharge, discharged on home Spiriva and Pulmicort.  Echo showed EF 50 to 55%, normal wall motion, grade 2 DD, mild mitral stenosis.  He was found to have incidentally positive QuantiFERON gold test,  chest x-ray was negative for pulmonary tuberculosis.  He has pending referral to infectious disease for further evaluation.  Patient will need day of surgery labs and evaluation.  EKG 04/17/2021: Sinus rhythm.  Rate  74. Left axis deviation. Abnormal T, consider ischemia, lateral leads  TTE 04/17/2021: 1. Left ventricular ejection fraction, by estimation, is 50 to 55%. The  left ventricle has low normal function. The left ventricle has no regional  wall motion abnormalities. There is mild left ventricular hypertrophy.  Left ventricular diastolic  parameters are consistent with Grade II diastolic dysfunction  (pseudonormalization). Elevated left atrial pressure.  2. Right ventricular systolic function is normal. The right ventricular  size is normal. There is normal pulmonary artery systolic pressure. The  estimated right ventricular systolic pressure is 16.1 mmHg.  3. Left atrial size was moderately dilated.  4. The mitral valve is degenerative. Trivial mitral valve regurgitation.  Mild mitral stenosis. The mean mitral valve gradient is 4.0 mmHg at HR  65bpm. MVA 1.9 cm^2 by coninuity equation.  5. The aortic valve was not well visualized. Aortic valve regurgitation  is not visualized. No aortic stenosis is present.  6. The inferior vena cava is normal in size with <50% respiratory  variability, suggesting right atrial pressure of 8 mmHg.   Cath 03/04/21 (care everywhere): IMPRESSION: 1. Ostial LAD has a lesions with a DFR of 0.90 suggestive that is not significant flow limiting.  2. Proximal PDA 50% lesion.   RECOMMENDATIONS: 1. Continue aggressive risk factor modifications.  )       Anesthesia Quick Evaluation

## 2021-05-14 ENCOUNTER — Encounter (HOSPITAL_COMMUNITY): Payer: Self-pay | Admitting: Vascular Surgery

## 2021-05-14 ENCOUNTER — Encounter (HOSPITAL_COMMUNITY)
Admission: RE | Disposition: A | Discharge status: Home / Self Care | Payer: Self-pay | Source: Home / Self Care | Attending: Vascular Surgery

## 2021-05-14 ENCOUNTER — Ambulatory Visit (HOSPITAL_COMMUNITY)
Admission: RE | Admit: 2021-05-14 | Discharge: 2021-05-14 | Disposition: A | Discharge status: Home / Self Care | Payer: Medicare Other | Attending: Vascular Surgery | Admitting: Vascular Surgery

## 2021-05-14 ENCOUNTER — Ambulatory Visit (HOSPITAL_COMMUNITY): Payer: Medicare Other | Admitting: Physician Assistant

## 2021-05-14 ENCOUNTER — Other Ambulatory Visit: Payer: Self-pay

## 2021-05-14 DIAGNOSIS — I132 Hypertensive heart and chronic kidney disease with heart failure and with stage 5 chronic kidney disease, or end stage renal disease: Secondary | ICD-10-CM | POA: Diagnosis present

## 2021-05-14 DIAGNOSIS — J449 Chronic obstructive pulmonary disease, unspecified: Secondary | ICD-10-CM | POA: Diagnosis not present

## 2021-05-14 DIAGNOSIS — Z992 Dependence on renal dialysis: Secondary | ICD-10-CM | POA: Diagnosis not present

## 2021-05-14 DIAGNOSIS — N186 End stage renal disease: Secondary | ICD-10-CM | POA: Insufficient documentation

## 2021-05-14 DIAGNOSIS — I5030 Unspecified diastolic (congestive) heart failure: Secondary | ICD-10-CM | POA: Insufficient documentation

## 2021-05-14 DIAGNOSIS — F17200 Nicotine dependence, unspecified, uncomplicated: Secondary | ICD-10-CM | POA: Diagnosis not present

## 2021-05-14 DIAGNOSIS — I480 Paroxysmal atrial fibrillation: Secondary | ICD-10-CM | POA: Insufficient documentation

## 2021-05-14 DIAGNOSIS — Z8673 Personal history of transient ischemic attack (TIA), and cerebral infarction without residual deficits: Secondary | ICD-10-CM | POA: Insufficient documentation

## 2021-05-14 HISTORY — PX: AV FISTULA PLACEMENT: SHX1204

## 2021-05-14 HISTORY — DX: Dyspnea, unspecified: R06.00

## 2021-05-14 HISTORY — DX: Unspecified dementia, unspecified severity, without behavioral disturbance, psychotic disturbance, mood disturbance, and anxiety: F03.90

## 2021-05-14 LAB — TYPE AND SCREEN
ABO/RH(D): O POS
Antibody Screen: NEGATIVE

## 2021-05-14 LAB — POCT I-STAT, CHEM 8
BUN: 23 mg/dL (ref 8–23)
Calcium, Ion: 0.99 mmol/L — ABNORMAL LOW (ref 1.15–1.40)
Chloride: 96 mmol/L — ABNORMAL LOW (ref 98–111)
Creatinine, Ser: 7.4 mg/dL — ABNORMAL HIGH (ref 0.61–1.24)
Glucose, Bld: 91 mg/dL (ref 70–99)
HCT: 21 % — ABNORMAL LOW (ref 39.0–52.0)
Hemoglobin: 7.1 g/dL — ABNORMAL LOW (ref 13.0–17.0)
Potassium: 3.8 mmol/L (ref 3.5–5.1)
Sodium: 140 mmol/L (ref 135–145)
TCO2: 34 mmol/L — ABNORMAL HIGH (ref 22–32)

## 2021-05-14 SURGERY — INSERTION OF ARTERIOVENOUS (AV) GORE-TEX GRAFT ARM
Anesthesia: Monitor Anesthesia Care | Site: Arm Lower | Laterality: Right

## 2021-05-14 MED ORDER — OXYCODONE-ACETAMINOPHEN 10-325 MG PO TABS: 1.0000 | ORAL_TABLET | Freq: Four times a day (QID) | ORAL | 0 refills | Status: DC | PRN

## 2021-05-14 MED ORDER — LIDOCAINE 2% (20 MG/ML) 5 ML SYRINGE
INTRAMUSCULAR | Status: AC
  Filled 2021-05-14: qty 5

## 2021-05-14 MED ORDER — MIDAZOLAM HCL 2 MG/2ML IJ SOLN
INTRAMUSCULAR | Status: AC
  Administered 2021-05-14: 1 mg via INTRAVENOUS
  Filled 2021-05-14: qty 2

## 2021-05-14 MED ORDER — CHLORHEXIDINE GLUCONATE 0.12 % MT SOLN
OROMUCOSAL | Status: AC
  Administered 2021-05-14: 15 mL via OROMUCOSAL
  Filled 2021-05-14: qty 15

## 2021-05-14 MED ORDER — MIDAZOLAM HCL 2 MG/2ML IJ SOLN: 1.0000 mg | Freq: Once | INTRAMUSCULAR | Status: AC

## 2021-05-14 MED ORDER — DEXAMETHASONE SODIUM PHOSPHATE 10 MG/ML IJ SOLN
INTRAMUSCULAR | Status: AC
  Filled 2021-05-14: qty 1

## 2021-05-14 MED ORDER — ROCURONIUM BROMIDE 10 MG/ML (PF) SYRINGE
PREFILLED_SYRINGE | INTRAVENOUS | Status: AC
  Filled 2021-05-14: qty 10

## 2021-05-14 MED ORDER — FENTANYL CITRATE (PF) 250 MCG/5ML IJ SOLN
INTRAMUSCULAR | Status: AC
  Filled 2021-05-14: qty 5

## 2021-05-14 MED ORDER — CEFAZOLIN SODIUM-DEXTROSE 2-4 GM/100ML-% IV SOLN
2.0000 g | INTRAVENOUS | Status: AC
  Administered 2021-05-14: 2 g via INTRAVENOUS
  Filled 2021-05-14: qty 100

## 2021-05-14 MED ORDER — LIDOCAINE-EPINEPHRINE 1 %-1:100000 IJ SOLN
INTRAMUSCULAR | Status: DC | PRN
  Administered 2021-05-14: 10 mL

## 2021-05-14 MED ORDER — CHLORHEXIDINE GLUCONATE 4 % EX LIQD: 60.0000 mL | Freq: Once | CUTANEOUS | Status: DC

## 2021-05-14 MED ORDER — HEPARIN SODIUM (PORCINE) 1000 UNIT/ML IJ SOLN
INTRAMUSCULAR | Status: DC | PRN
  Administered 2021-05-14: 6500 [IU] via INTRAVENOUS

## 2021-05-14 MED ORDER — MIDAZOLAM HCL 2 MG/2ML IJ SOLN
INTRAMUSCULAR | Status: AC
  Filled 2021-05-14: qty 2

## 2021-05-14 MED ORDER — SODIUM CHLORIDE 0.9 % IV SOLN: INTRAVENOUS | Status: DC

## 2021-05-14 MED ORDER — ONDANSETRON HCL 4 MG/2ML IJ SOLN
INTRAMUSCULAR | Status: DC | PRN
  Administered 2021-05-14: 4 mg via INTRAVENOUS

## 2021-05-14 MED ORDER — LIDOCAINE-EPINEPHRINE 1 %-1:100000 IJ SOLN
INTRAMUSCULAR | Status: AC
  Filled 2021-05-14: qty 1

## 2021-05-14 MED ORDER — HEPARIN 6000 UNIT IRRIGATION SOLUTION
Status: AC
  Filled 2021-05-14: qty 500

## 2021-05-14 MED ORDER — PROPOFOL 10 MG/ML IV BOLUS
INTRAVENOUS | Status: AC
  Filled 2021-05-14: qty 40

## 2021-05-14 MED ORDER — PHENYLEPHRINE HCL-NACL 20-0.9 MG/250ML-% IV SOLN
INTRAVENOUS | Status: DC | PRN
  Administered 2021-05-14: 25 ug/min via INTRAVENOUS

## 2021-05-14 MED ORDER — HEPARIN 6000 UNIT IRRIGATION SOLUTION
Status: DC | PRN
  Administered 2021-05-14: 1

## 2021-05-14 MED ORDER — HEPARIN SODIUM (PORCINE) 1000 UNIT/ML IJ SOLN
INTRAMUSCULAR | Status: AC
  Filled 2021-05-14: qty 1

## 2021-05-14 MED ORDER — PROTAMINE SULFATE 10 MG/ML IV SOLN
INTRAVENOUS | Status: DC | PRN
  Administered 2021-05-14 (×4): 10 mg via INTRAVENOUS

## 2021-05-14 MED ORDER — CHLORHEXIDINE GLUCONATE 0.12 % MT SOLN: 15.0000 mL | Freq: Once | OROMUCOSAL | Status: AC

## 2021-05-14 MED ORDER — ORAL CARE MOUTH RINSE: 15.0000 mL | Freq: Once | OROMUCOSAL | Status: AC

## 2021-05-14 MED ORDER — LIDOCAINE-EPINEPHRINE 2 %-1:100000 IJ SOLN
INTRAMUSCULAR | Status: DC | PRN
  Administered 2021-05-14: 20 mL via PERINEURAL

## 2021-05-14 MED ORDER — PHENYLEPHRINE 40 MCG/ML (10ML) SYRINGE FOR IV PUSH (FOR BLOOD PRESSURE SUPPORT)
PREFILLED_SYRINGE | INTRAVENOUS | Status: DC | PRN
  Administered 2021-05-14: 120 ug via INTRAVENOUS
  Administered 2021-05-14 (×2): 80 ug via INTRAVENOUS
  Administered 2021-05-14: 120 ug via INTRAVENOUS

## 2021-05-14 MED ORDER — PHENYLEPHRINE 40 MCG/ML (10ML) SYRINGE FOR IV PUSH (FOR BLOOD PRESSURE SUPPORT)
PREFILLED_SYRINGE | INTRAVENOUS | Status: AC
  Filled 2021-05-14: qty 10

## 2021-05-14 MED ORDER — LIDOCAINE-EPINEPHRINE (PF) 1 %-1:200000 IJ SOLN
INTRAMUSCULAR | Status: AC
  Filled 2021-05-14: qty 30

## 2021-05-14 MED ORDER — PROPOFOL 500 MG/50ML IV EMUL
INTRAVENOUS | Status: DC | PRN
  Administered 2021-05-14: 50 ug/kg/min via INTRAVENOUS

## 2021-05-14 MED ORDER — ONDANSETRON HCL 4 MG/2ML IJ SOLN
INTRAMUSCULAR | Status: AC
  Filled 2021-05-14: qty 2

## 2021-05-14 MED ORDER — FENTANYL CITRATE (PF) 100 MCG/2ML IJ SOLN: 50.0000 ug | Freq: Once | INTRAMUSCULAR | Status: AC

## 2021-05-14 MED ORDER — PROTAMINE SULFATE 10 MG/ML IV SOLN
INTRAVENOUS | Status: AC
  Filled 2021-05-14: qty 5

## 2021-05-14 MED ORDER — FENTANYL CITRATE (PF) 100 MCG/2ML IJ SOLN
INTRAMUSCULAR | Status: AC
  Administered 2021-05-14: 50 ug via INTRAVENOUS
  Filled 2021-05-14: qty 2

## 2021-05-14 MED ORDER — 0.9 % SODIUM CHLORIDE (POUR BTL) OPTIME
TOPICAL | Status: DC | PRN
  Administered 2021-05-14: 1000 mL

## 2021-05-14 MED ORDER — DEXAMETHASONE SODIUM PHOSPHATE 10 MG/ML IJ SOLN
INTRAMUSCULAR | Status: DC | PRN
  Administered 2021-05-14: 4 mg via INTRAVENOUS

## 2021-05-14 MED ORDER — LIDOCAINE-EPINEPHRINE (PF) 1 %-1:200000 IJ SOLN
INTRAMUSCULAR | Status: DC | PRN
  Administered 2021-05-14: 30 mL

## 2021-05-14 SURGICAL SUPPLY — 33 items
ADH SKN CLS APL DERMABOND .7 (GAUZE/BANDAGES/DRESSINGS) ×2
ARMBAND PINK RESTRICT EXTREMIT (MISCELLANEOUS) ×3 IMPLANT
BAG COUNTER SPONGE SURGICOUNT (BAG) ×3 IMPLANT
BAG SPNG CNTER NS LX DISP (BAG) ×2
CANISTER SUCT 3000ML PPV (MISCELLANEOUS) ×3 IMPLANT
CLIP LIGATING EXTRA MED SLVR (CLIP) ×3 IMPLANT
CLIP LIGATING EXTRA SM BLUE (MISCELLANEOUS) ×3 IMPLANT
CLIP VESOCCLUDE MED 6/CT (CLIP) ×3 IMPLANT
CLIP VESOCCLUDE SM WIDE 6/CT (CLIP) ×6 IMPLANT
COVER PROBE W GEL 5X96 (DRAPES) IMPLANT
DERMABOND ADVANCED (GAUZE/BANDAGES/DRESSINGS) ×1
DERMABOND ADVANCED .7 DNX12 (GAUZE/BANDAGES/DRESSINGS) ×2 IMPLANT
ELECT REM PT RETURN 9FT ADLT (ELECTROSURGICAL) ×3
ELECTRODE REM PT RTRN 9FT ADLT (ELECTROSURGICAL) ×2 IMPLANT
GLOVE SURG ENC MOIS LTX SZ7.5 (GLOVE) ×3 IMPLANT
GOWN STRL REUS W/ TWL LRG LVL3 (GOWN DISPOSABLE) ×4 IMPLANT
GOWN STRL REUS W/ TWL XL LVL3 (GOWN DISPOSABLE) ×2 IMPLANT
GOWN STRL REUS W/TWL LRG LVL3 (GOWN DISPOSABLE) ×6
GOWN STRL REUS W/TWL XL LVL3 (GOWN DISPOSABLE) ×3
GRAFT GORETEX STRT 4-7X45 (Vascular Products) ×3 IMPLANT
INSERT FOGARTY SM (MISCELLANEOUS) IMPLANT
KIT BASIN OR (CUSTOM PROCEDURE TRAY) ×3 IMPLANT
KIT TURNOVER KIT B (KITS) ×3 IMPLANT
NS IRRIG 1000ML POUR BTL (IV SOLUTION) ×3 IMPLANT
PACK CV ACCESS (CUSTOM PROCEDURE TRAY) ×3 IMPLANT
PAD ARMBOARD 7.5X6 YLW CONV (MISCELLANEOUS) ×6 IMPLANT
SUT MNCRL AB 4-0 PS2 18 (SUTURE) ×3 IMPLANT
SUT PROLENE 6 0 BV (SUTURE) ×12 IMPLANT
SUT VIC AB 3-0 SH 27 (SUTURE) ×3
SUT VIC AB 3-0 SH 27X BRD (SUTURE) ×2 IMPLANT
TOWEL GREEN STERILE (TOWEL DISPOSABLE) ×3 IMPLANT
UNDERPAD 30X36 HEAVY ABSORB (UNDERPADS AND DIAPERS) ×3 IMPLANT
WATER STERILE IRR 1000ML POUR (IV SOLUTION) ×3 IMPLANT

## 2021-05-14 NOTE — Transfer of Care (Signed)
Immediate Anesthesia Transfer of Care Note  Patient: Anthony [name]  Procedure(s) Performed: INSERTION OF ARTERIOVENOUS (AV) GORE-TEX GRAFT ARM (Right: Arm Lower)  Patient Location: PACU  Anesthesia Type:MAC and Regional  Level of Consciousness: sedated and patient cooperative  Airway & Oxygen Therapy: Patient Spontanous Breathing and Patient connected to face mask oxygen  Post-op Assessment: Report given to RN and Post -op Vital signs reviewed and stable  Post vital signs: Reviewed and stable  Last Vitals:  Vitals Value Taken Time  BP 124/60 05/14/21 1436  Temp    Pulse 56 05/14/21 1438  Resp 12 05/14/21 1438  SpO2 100 % 05/14/21 1438  Vitals shown include unvalidated device data.  Last Pain:  Vitals:   05/14/21 0930  TempSrc:   PainSc: 0-No pain         Complications: No notable events documented.

## 2021-05-14 NOTE — Discharge Instructions (Signed)
° °  [facility] [place] ° °Discharge Instructions ° °AV Fistula or Graft Surgery for Dialysis Access ° °Please refer to the following instructions for your post-procedure care. Your surgeon or physician assistant will discuss any changes with you. ° °Activity ° °You may drive the day following your surgery, if you are comfortable and no longer taking prescription pain medication. Resume full activity as the soreness in your incision resolves. ° °Bathing/Showering ° °You may shower after you go home. Keep your incision dry for 48 hours. Do not soak in a bathtub, hot tub, or swim until the incision heals completely. You may not shower if you have a hemodialysis catheter. ° °Incision Care ° °Clean your incision with mild soap and water after 48 hours. Pat the area dry with a clean towel. You do not need a bandage unless otherwise instructed. Do not apply any ointments or creams to your incision. You may have skin glue on your incision. Do not peel it off. It will come off on its own in about one week. Your arm may swell a bit after surgery. To reduce swelling use pillows to elevate your arm so it is above your heart. Your doctor will tell you if you need to lightly wrap your arm with an ACE bandage. ° °Diet ° °Resume your normal diet. There are not special food restrictions following this procedure. In order to heal from your surgery, it is CRITICAL to get adequate nutrition. Your body requires vitamins, minerals, and protein. Vegetables are the best source of vitamins and minerals. Vegetables also provide the perfect balance of protein. Processed food has little nutritional value, so try to avoid this. ° °Medications ° °Resume taking all of your medications. If your incision is causing pain, you may take over-the counter pain relievers such as acetaminophen (Tylenol). If you were prescribed a stronger pain medication, please be aware these medications can cause nausea and constipation. Prevent  nausea by taking the medication with a snack or meal. Avoid constipation by drinking plenty of fluids and eating foods with high amount of fiber, such as fruits, vegetables, and grains. Do not take Tylenol if you are taking prescription pain medications. ° ° ° ° °Follow up °Your surgeon may want to see you in the office following your access surgery. If so, this will be arranged at the time of your surgery. ° °Please call us immediately for any of the following conditions: ° °Increased pain, redness, drainage (pus) from your incision site °Fever of 101 degrees or higher °Severe or worsening pain at your incision site °Hand pain or numbness. ° °Reduce your risk of vascular disease: ° °Stop smoking. If you would like help, call [facility] at 1-800-QUIT-NOW (1-800-784-8669) or [place] at [phone] ° °Manage your cholesterol °Maintain a desired weight °Control your diabetes °Keep your blood pressure down ° °Dialysis ° °It will take several weeks to several months for your new dialysis access to be ready for use. Your surgeon will determine when it is OK to use it. Your nephrologist will continue to direct your dialysis. You can continue to use your Permcath until your new access is ready for use. ° °If you have any questions, please call the office at [phone]. ° °

## 2021-05-14 NOTE — Anesthesia Procedure Notes (Signed)
Procedure Name: MAC Date/Time: 05/14/2021 12:45 PM Performed by: Jenne Campus, CRNA Pre-anesthesia Checklist: Patient identified, Emergency Drugs available, Suction available and Patient being monitored Oxygen Delivery Method: Simple face mask

## 2021-05-14 NOTE — Anesthesia Procedure Notes (Signed)
Procedure Name: MAC Date/Time: 05/14/2021 12:47 PM Performed by: Kathryne Hitch, CRNA Pre-anesthesia Checklist: Patient identified, Emergency Drugs available, Suction available and Patient being monitored Patient Re-evaluated:Patient Re-evaluated prior to induction Oxygen Delivery Method: Simple face mask Preoxygenation: Pre-oxygenation with 100% oxygen Induction Type: IV induction Placement Confirmation: positive ETCO2 Dental Injury: Teeth and Oropharynx as per pre-operative assessment

## 2021-05-14 NOTE — Anesthesia Procedure Notes (Signed)
Anesthesia Regional Block: Supraclavicular block   Pre-Anesthetic Checklist: , timeout performed,  Correct Patient, Correct Site, Correct Laterality,  Correct Procedure, Correct Position, site marked,  Risks and benefits discussed,  Surgical consent,  Pre-op evaluation,  At surgeon's request and post-op pain management  Laterality: Right  Prep: chloraprep       Needles:  Injection technique: Single-shot  Needle Type: Echogenic Stimulator Needle     Needle Length: 5cm  Needle Gauge: 22     Additional Needles:   Procedures:, nerve stimulator,,,,,     Nerve Stimulator or Paresthesia:  Response: biceps flexion, 0.45 mA  Additional Responses:   Narrative:  Start time: 05/14/2021 9:00 AM End time: 05/14/2021 9:08 AM Injection made incrementally with aspirations every 5 mL.  Performed by: Personally  Anesthesiologist: Albertha Ghee, MD  Additional Notes: Functioning IV was confirmed and monitors were applied.  A 1m 22ga Arrow echogenic stimulator needle was used. Sterile prep and drape,hand hygiene and sterile gloves were used.  Negative aspiration and negative test dose prior to incremental administration of local anesthetic. The patient tolerated the procedure well.  Ultrasound guidance: relevent anatomy identified, needle position confirmed, local anesthetic spread visualized around nerve(s), vascular puncture avoided.  Image printed for medical record.

## 2021-05-14 NOTE — Op Note (Signed)
    NAME: Anthony [name]    MRN: [id number] DOB: 1941/07/11    DATE OF OPERATION: 05/14/2021  PREOP DIAGNOSIS:    End-stage renal disease  POSTOP DIAGNOSIS:    Same  PROCEDURE:    New right forearm AV graft  SURGEON: Judeth Cornfield. Scot Dock, MD  ASSIST: Risa Grill PA  ANESTHESIA: Axillary block  EBL: Minimal  INDICATIONS:    Anthony [name] is a 80 y.o. male who presents for new access  FINDINGS:   4 mm brachial artery.  4 mm brachial vein  TECHNIQUE:   The patient was taken to the operating room after an axillary block was placed.  The right arm was prepped and draped in usual sterile fashion.  Previous vein map showed no usable vein for a fistula.  I looked myself and concurred.  A longitudinal incision was made just above the antecubital level and here the brachial artery and adjacent brachial vein were dissected free.  I felt they were adequate in size.  The brachial vein was dissected free and ligated distally and irrigated up with heparinized saline.  It took a 4.5 mm dilator.  It irrigated easily with heparinized saline.  The adjacent brachial artery was soft with an excellent pulse.  Using 1 distal counterincision a 4-7 mm PTFE graft was tunneled in a loop fashion in the forearm with the arterial aspect of the graft along the radial aspect of the forearm.  The patient was heparinized.  The brachial artery was clamped proximally and distally and a longitudinal arteriotomy was made.  A short segment of the 4 mm into the graft was excised, the graft was slightly spatulated and sewn end-to-side to the artery using continuous 6-0 Prolene suture.  The graft was imported the appropriate length for anastomosis to the brachial vein.  The vein had been spatulated.  The graft was cut to the appropriate length spatulated and sewn into into the vein using 2 continuous 6-0 Prolene sutures.  At the completion was an excellent thrill in the graft.  There was a palpable radial pulse.  The  heparin was partially reversed with protamine.  Hemostasis was obtained in the wounds.  The wounds were closed with a deep layer of 3-0 Vicryl and skin closed with 4-0 Vicryl.  Dermabond was applied.  The patient tolerated procedure well was transferred to recovery room in stable condition.  All needle and sponge counts were correct.  Given the complexity of the case a first assistant was necessary in order to expedient the procedure and safely perform the technical aspects of the operation.  Deitra Mayo, MD, FACS [facility]  DATE OF DICTATION:   05/14/2021

## 2021-05-14 NOTE — H&P (Signed)
HPI: Anthony [name] is a 80 y.o. (May 25, 1941) male admitted to the hospital with GI bleed.  Past medical history also significant for end-stage renal disease on hemodialysis.  He is being seen in consultation for evaluation for permanent dialysis access placement.  He has history of a left upper arm loop graft that was placed at [facility] in 2018.  Patient states this worked for a couple years then thrombosed.  He is currently dialyzing via right IJ TDC.  He has never had any dialysis access placement in his right arm.  He is a current everyday smoker.  He does not have a pacemaker.  He has atrial fibrillation however is not anticoagulated due to fall risk and GI bleed.         Past Medical History:  Diagnosis Date   Alcohol abuse     Arthritis     CHF (congestive heart failure) (HCC)     Chicken pox     Chronic kidney disease     ESRD (end stage renal disease) on dialysis ([place])     GI bleed     Hypertension     Insomnia     Stroke ([place])        No past surgical history on file.   Social History         Socioeconomic History   Marital status: Married      Spouse name: Not on file   Number of children: Not on file   Years of education: Not on file   Highest education level: Not on file  Occupational History   Not on file  Tobacco Use   Smoking status: Every Day   Smokeless tobacco: Never  Substance and Sexual Activity   Alcohol use: Not Currently   Drug use: Not Currently   Sexual activity: Not on file  Other Topics Concern   Not on file  Social History Narrative   Not on file    Social Determinants of Health    Financial Resource Strain: Not on file  Food Insecurity: Not on file  Transportation Needs: Not on file  Physical Activity: Not on file  Stress: Not on file  Social Connections: Not on file  Intimate Partner Violence: Not on file           Family History  Problem Relation Age of Onset   Hypertension Mother     Hypertension Father                  Current Facility-Administered Medications  Medication Dose Route Frequency Provider Last Rate Last Admin   0.9 %  sodium chloride infusion (Manually program via Guardrails IV Fluids)   Intravenous Once Doutova, Anastassia, MD       0.9 %  sodium chloride infusion  10 mL/hr Intravenous Once Venter, Margaux, PA-C       acetaminophen (TYLENOL) tablet 650 mg  650 mg Oral Q6H PRN Toy Baker, MD        Or   acetaminophen (TYLENOL) suppository 650 mg  650 mg Rectal Q6H PRN Doutova, Anastassia, MD       albuterol (PROVENTIL) (2.5 MG/3ML) 0.083% nebulizer solution 2.5 mg  2.5 mg Nebulization Q2H PRN Doutova, Anastassia, MD       albuterol (PROVENTIL) (2.5 MG/3ML) 0.083% nebulizer solution 2.5 mg  2.5 mg Nebulization Q2H PRN Toy Baker, MD       [START ON 04/19/2021] amLODipine (NORVASC) tablet 10 mg  10 mg Oral Daily Aileen Fass,  Tammi Klippel, MD       atorvastatin (LIPITOR) tablet 20 mg  20 mg Oral Daily Doutova, Anastassia, MD   20 mg at 04/18/21 1235   calcium acetate (PHOSLO) capsule 2,001 mg  2,001 mg Oral TID WC Doutova, Anastassia, MD   2,001 mg at 04/18/21 1235   carvedilol (COREG) tablet 6.25 mg  6.25 mg Oral BID WC Charlynne Cousins, MD   6.25 mg at 04/18/21 0841   Chlorhexidine Gluconate Cloth 2 % PADS 6 each  6 each Topical Q0600 Dwana Melena, MD   6 each at 04/18/21 [phone]   doxycycline (VIBRA-TABS) tablet 100 mg  100 mg Oral Q12H Pham, Minh Q, RPH-CPP   100 mg at 04/18/21 1234   feeding supplement (NEPRO CARB STEADY) liquid 237 mL  237 mL Oral Q24H Charlynne Cousins, MD   237 mL at 04/17/21 1243   hydrALAZINE (APRESOLINE) tablet 100 mg  100 mg Oral BID Charlynne Cousins, MD   100 mg at 04/18/21 1234   hydrALAZINE (APRESOLINE) tablet 25 mg  25 mg Oral Q6H PRN Charlynne Cousins, MD       HYDROcodone-acetaminophen (NORCO/VICODIN) 5-325 MG per tablet 1-2 tablet  1-2 tablet Oral Q4H PRN Toy Baker, MD       ipratropium-albuterol (DUONEB) 0.5-2.5 (3)  MG/3ML nebulizer solution 3 mL  3 mL Nebulization Q6H Doutova, Anastassia, MD   3 mL at 04/18/21 0828   isosorbide mononitrate (IMDUR) 24 hr tablet 30 mg  30 mg Oral Daily Charlynne Cousins, MD   30 mg at 04/18/21 1235   ketotifen (ZADITOR) 0.025 % ophthalmic solution 1 drop  1 drop Both Eyes BID Toy Baker, MD   1 drop at 04/18/21 1238   melatonin tablet 3 mg  3 mg Oral QHS Charlynne Cousins, MD   3 mg at 04/17/21 2128   memantine (NAMENDA) tablet 5 mg  5 mg Oral Daily Toy Baker, MD   5 mg at 04/18/21 1234   mometasone-formoterol (DULERA) 200-5 MCG/ACT inhaler 2 puff  2 puff Inhalation BID Toy Baker, MD   2 puff at 04/18/21 [id number]   multivitamin (RENA-VIT) tablet 1 tablet  1 tablet Oral QHS Charlynne Cousins, MD   1 tablet at 04/17/21 2128   pantoprazole (PROTONIX) EC tablet 40 mg  40 mg Oral [id number] Vena Rua, PA-C   40 mg at 04/18/21 [id number]   [START ON 04/19/2021] predniSONE (DELTASONE) tablet 20 mg  20 mg Oral Q breakfast Charlynne Cousins, MD       QUEtiapine (SEROQUEL) tablet 25 mg  25 mg Oral QHS PRN Charlynne Cousins, MD       umeclidinium bromide (INCRUSE ELLIPTA) 62.5 MCG/INH 1 puff  1 puff Inhalation Daily Charlynne Cousins, MD   1 puff at 04/18/21 0831      No Known Allergies     REVIEW OF SYSTEMS:    '[X]'$  denotes positive finding, '[ ]'$  denotes negative finding Cardiac   Comments:  Chest pain or chest pressure:      Shortness of breath upon exertion:      Short of breath when lying flat:      Irregular heart rhythm:             Vascular      Pain in calf, thigh, or hip brought on by ambulation:      Pain in feet at night that wakes you up from your sleep:  Blood clot in your veins:      Leg swelling:              Pulmonary      Oxygen at home:      Productive cough:       Wheezing:              Neurologic      Sudden weakness in arms or legs:       Sudden numbness in arms or legs:       Sudden onset of difficulty  speaking or slurred speech:      Temporary loss of vision in one eye:       Problems with dizziness:              Gastrointestinal      Blood in stool:       Vomited blood:              Genitourinary      Burning when urinating:       Blood in urine:             Psychiatric      Major depression:              Hematologic      Bleeding problems:      Problems with blood clotting too easily:             Skin      Rashes or ulcers:             Constitutional      Fever or chills:          PHYSICAL EXAMINATION:   Vitals:   05/14/21 0604 05/14/21 0622  BP:  (!) 162/84  Pulse: 62   Resp: 18   Temp: 98 F (36.7 C)   SpO2: 98%       General:  WDWN in NAD; vital signs documented above Gait: Not observed HENT: WNL, normocephalic Pulmonary: normal non-labored breathing Cardiac: irregular HR Abdomen: soft, NT, no masses Skin: without rashes Vascular Exam/Pulses:   Right Left  Radial 2+ (normal) 2+ (normal)    Musculoskeletal: no muscle wasting or atrophy       Neurologic: A&O X 3;  No focal weakness or paresthesias are detected Psychiatric:  The pt has Normal affect.     Non-Invasive Vascular Imaging:   Vein map pending Adequate L basilic and R cephalic based on vein mapping [facility] 09/2020       ASSESSMENT/PLAN:: 80 y.o. male with ESRD on HD in need of new permanent dialysis access. Plan for right arm avf vs avg today in OR.    Kaylanie Capili C. Donzetta Matters, MD [facility] [place] Office: [phone] Pager: [phone]

## 2021-05-15 ENCOUNTER — Encounter (HOSPITAL_COMMUNITY): Payer: Self-pay | Admitting: Vascular Surgery

## 2021-05-15 NOTE — Anesthesia Postprocedure Evaluation (Signed)
Anesthesia Post Note  Patient: Anthony [name]  Procedure(s) Performed: INSERTION OF ARTERIOVENOUS (AV) GORE-TEX GRAFT ARM (Right: Arm Lower)     Patient location during evaluation: PACU Anesthesia Type: Regional and MAC Level of consciousness: awake and alert Pain management: pain level controlled Vital Signs Assessment: post-procedure vital signs reviewed and stable Respiratory status: spontaneous breathing, nonlabored ventilation, respiratory function stable and patient connected to nasal cannula oxygen Cardiovascular status: stable and blood pressure returned to baseline Postop Assessment: no apparent nausea or vomiting Anesthetic complications: no   No notable events documented.  Last Vitals:  Vitals:   05/14/21 1451 05/14/21 1506  BP: 127/72 128/90  Pulse: (!) 58 63  Resp: 14 16  Temp:  (!) 36.3 C  SpO2: 100% 96%    Last Pain:  Vitals:   05/14/21 1506  TempSrc:   PainSc: 0-No pain                 Sheresa Cullop S

## 2021-05-16 ENCOUNTER — Ambulatory Visit (INDEPENDENT_AMBULATORY_CARE_PROVIDER_SITE_OTHER): Payer: Medicare Other | Admitting: Infectious Disease

## 2021-05-16 ENCOUNTER — Encounter: Payer: Self-pay | Admitting: Infectious Disease

## 2021-05-16 ENCOUNTER — Other Ambulatory Visit: Payer: Self-pay

## 2021-05-16 VITALS — BP 113/72 | HR 86 | Temp 97.6°F | Wt 148.8 lb

## 2021-05-16 DIAGNOSIS — R9431 Abnormal electrocardiogram [ECG] [EKG]: Secondary | ICD-10-CM | POA: Diagnosis not present

## 2021-05-16 DIAGNOSIS — Z227 Latent tuberculosis: Secondary | ICD-10-CM | POA: Diagnosis not present

## 2021-05-16 DIAGNOSIS — R7612 Nonspecific reaction to cell mediated immunity measurement of gamma interferon antigen response without active tuberculosis: Secondary | ICD-10-CM

## 2021-05-16 DIAGNOSIS — I340 Nonrheumatic mitral (valve) insufficiency: Secondary | ICD-10-CM

## 2021-05-16 DIAGNOSIS — N186 End stage renal disease: Secondary | ICD-10-CM | POA: Diagnosis not present

## 2021-05-16 DIAGNOSIS — I5032 Chronic diastolic (congestive) heart failure: Secondary | ICD-10-CM

## 2021-05-16 DIAGNOSIS — I10 Essential (primary) hypertension: Secondary | ICD-10-CM | POA: Diagnosis not present

## 2021-05-16 DIAGNOSIS — I480 Paroxysmal atrial fibrillation: Secondary | ICD-10-CM

## 2021-05-16 DIAGNOSIS — I2510 Atherosclerotic heart disease of native coronary artery without angina pectoris: Secondary | ICD-10-CM

## 2021-05-16 DIAGNOSIS — Z992 Dependence on renal dialysis: Secondary | ICD-10-CM

## 2021-05-16 HISTORY — DX: Nonrheumatic mitral (valve) insufficiency: I34.0

## 2021-05-16 HISTORY — DX: Latent tuberculosis: Z22.7

## 2021-05-16 HISTORY — DX: Nonspecific reaction to cell mediated immunity measurement of gamma interferon antigen response without active tuberculosis: R76.12

## 2021-05-16 MED ORDER — VITAMIN B-6 50 MG PO TABS: 50.0000 mg | ORAL_TABLET | Freq: Every day | ORAL | 8 refills | Status: AC

## 2021-05-16 MED ORDER — ISONIAZID 300 MG PO TABS: 300.0000 mg | ORAL_TABLET | Freq: Every day | ORAL | 8 refills | Status: DC

## 2021-05-16 NOTE — Progress Notes (Signed)
Subjective:    Reason for infectious disease consult: Positive QuantiFERON gold and positive PPD skin test  Requesting physician: Domingo Mend, MD   Patient ID: Anthony [name], male    DOB: 1940-11-22, 80 y.o.   MRN: [id number]  HPI  Anthony [name] is a 80 year old black man with a past medical history significant for congestive heart failure, end-stage renal disease on hemodialysis on Monday Wednesday Friday, hypertension stroke recent GI bleed dementia on Namenda who recently had a positive QuantiFERON gold test.  The reason for the QuantiFERON gold test was that he was seen by a physician at [facility] in [place].  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  QuantiFERON was repeated and positive at [place].  Chest x-ray was negative for any evidence of tuberculosis.  He does have chronic cough which is in part due to his continued smoking and COPD.  He was hospitalized here at [facility] with shortness of breath and need for hemodialysis.  He had apparently just moved from [place] to [place].  Lab done here included HIV test which was negative.  He himself does not know why a PPD was performed in 2021.  He was born in [place] and has largely lived in [place] and travel to the [place].  He was incarcerated for 2 years roughly 20 years ago.  He has never known himself to be around someone with known tuberculosis.  Denies fevers chills nausea malaise or weight loss.  He does not have a history of homelessness.  He has not traveled outside [place].     Past Medical History:  Diagnosis Date   Alcohol abuse 2021   Arthritis    CHF (congestive heart failure) ([place])    Chicken pox    Chronic kidney disease    M W F   Dementia ([place])    mild   Dyspnea    ESRD (end stage renal disease) on dialysis ([place])    GI bleed    History of blood transfusion 03/2021   Hypertension    Insomnia     Positive QuantiFERON-TB Gold test 05/16/2021   Stroke ([place])    TB lung, latent 05/16/2021    Past Surgical History:  Procedure Laterality Date   AV FISTULA PLACEMENT Right 05/14/2021   Procedure: INSERTION OF ARTERIOVENOUS (AV) GORE-TEX GRAFT ARM;  Surgeon: Angelia Mould, MD;  Location: [facility] OR;  Service: Vascular;  Laterality: Right;   ENTEROSCOPY N/A 04/18/2021   Procedure: ENTEROSCOPY;  Surgeon: Jackquline Denmark, MD;  Location: [facility];  Service: Endoscopy;  Laterality: N/A;   HOT HEMOSTASIS N/A 04/18/2021   Procedure: HOT HEMOSTASIS (ARGON PLASMA COAGULATION/BICAP);  Surgeon: Jackquline Denmark, MD;  Location: [facility] ENDOSCOPY;  Service: Endoscopy;  Laterality: N/A;   SUBMUCOSAL TATTOO INJECTION  04/18/2021   Procedure: SUBMUCOSAL TATTOO INJECTION;  Surgeon: Jackquline Denmark, MD;  Location: [facility] ENDOSCOPY;  Service: Endoscopy;;    Family History  Problem Relation Age of Onset   Hypertension Mother    Hypertension Father       Social History   Socioeconomic History   Marital status: Married    Spouse name: Not on file   Number of children: Not on file   Years of education: Not on file   Highest education level: Not on file  Occupational History   Not on file  Tobacco Use   Smoking status: Every Day    Packs/day: 0.25    Years:  61.00    Pack years: 15.25    Types: Cigarettes   Smokeless tobacco: Never  Vaping Use   Vaping Use: Never used  Substance and Sexual Activity   Alcohol use: Not Currently    Comment: quit 2021   Drug use: Not Currently   Sexual activity: Not Currently  Other Topics Concern   Not on file  Social History Narrative   Not on file   Social Determinants of Health   Financial Resource Strain: Not on file  Food Insecurity: Not on file  Transportation Needs: Not on file  Physical Activity: Not on file  Stress: Not on file  Social Connections: Not on file    No Known Allergies   Current Outpatient Medications:    albuterol (PROVENTIL HFA) 108  (90 Base) MCG/ACT inhaler, Inhale 2 puffs into the lungs every 4 (four) hours as needed for wheezing or shortness of breath., Disp: 18 g, Rfl: 0   ASPIRIN LOW DOSE 81 MG chewable tablet, Chew 81 mg by mouth daily., Disp: , Rfl:    atorvastatin (LIPITOR) 20 MG tablet, TAKE 1 TABLET BY MOUTH EVERYDAY AT BEDTIME, Disp: 90 tablet, Rfl: 1   budesonide-formoterol (SYMBICORT) 160-4.5 MCG/ACT inhaler, Inhale 2 puffs into the lungs 2 (two) times daily., Disp: , Rfl:    calcium acetate (PHOSLO) 667 MG capsule, Take 1,334 mg by mouth 3 (three) times daily with meals., Disp: , Rfl:    carboxymethylcellulose (REFRESH PLUS) 0.5 % SOLN, Place 1 drop into both eyes daily as needed (dry eyes)., Disp: , Rfl:    carvedilol (COREG) 6.25 MG tablet, Take 1 tablet (6.25 mg total) by mouth 2 (two) times daily with a meal., Disp: 60 tablet, Rfl: 0   hydrALAZINE (APRESOLINE) 50 MG tablet, Take 50 mg by mouth every 8 (eight) hours., Disp: , Rfl:    isoniazid (NYDRAZID) 300 MG tablet, Take 1 tablet (300 mg total) by mouth daily., Disp: 30 tablet, Rfl: 8   isosorbide mononitrate (IMDUR) 30 MG 24 hr tablet, Take 30 mg by mouth daily., Disp: , Rfl:    lidocaine-prilocaine (EMLA) cream, Apply 1 application topically as needed (port access)., Disp: , Rfl:    memantine (NAMENDA) 5 MG tablet, Take 5 mg by mouth daily., Disp: , Rfl:    Methoxy PEG-Epoetin Beta (MIRCERA IJ), Mircera, Disp: , Rfl:    nicotine (NICODERM CQ - DOSED IN MG/24 HOURS) 14 mg/24hr patch, Place 1 patch (14 mg total) onto the skin daily., Disp: 90 patch, Rfl: 1   oxyCODONE-acetaminophen (PERCOCET) 10-325 MG tablet, Take 1 tablet by mouth every 6 (six) hours as needed for pain., Disp: 20 tablet, Rfl: 0   pantoprazole (PROTONIX) 40 MG tablet, Take 1 tablet (40 mg total) by mouth daily., Disp: , Rfl:    pyridOXINE (VITAMIN B-6) 50 MG tablet, Take 1 tablet (50 mg total) by mouth daily., Disp: 30 tablet, Rfl: 8   Tiotropium Bromide Monohydrate (SPIRIVA RESPIMAT) 2.5  MCG/ACT AERS, Inhale 2 puffs into the lungs daily., Disp: 4 g, Rfl: 3   traZODone (DESYREL) 50 MG tablet, TAKE 1 TO 2 TABLETS AT BEDTIME AS NEEDED, Disp: 45 tablet, Rfl: 1   ADVAIR HFA 45-21 MCG/ACT inhaler, INHALE 2 PUFFS INTO THE LUNGS IN THE MORNING AND AT BEDTIME (Patient not taking: No sig reported), Disp: 36 each, Rfl: 2    Review of Systems  Unable to perform ROS: Dementia      Objective:   Physical Exam Constitutional:      Appearance:  He is well-developed.  HENT:     Head: Normocephalic and atraumatic.  Eyes:     General:        Right eye: Discharge present.        Left eye: No discharge.     Conjunctiva/sclera: Conjunctivae normal.  Cardiovascular:     Rate and Rhythm: Normal rate and regular rhythm.     Heart sounds: Murmur heard.  Pulmonary:     Effort: Pulmonary effort is normal. No respiratory distress.     Breath sounds: No stridor. No wheezing or rhonchi.  Abdominal:     General: There is no distension.     Palpations: Abdomen is soft.  Musculoskeletal:        General: Swelling present. No tenderness. Normal range of motion.     Cervical back: Normal range of motion and neck supple.  Skin:    General: Skin is warm and dry.     Coloration: Skin is not pale.     Findings: No erythema or rash.  Neurological:     General: No focal deficit present.     Mental Status: He is alert and oriented to person, place, and time.  Psychiatric:        Mood and Affect: Mood normal.        Speech: Speech is delayed.        Behavior: Behavior normal.        Thought Content: Thought content normal.        Cognition and Memory: Memory is impaired. He exhibits impaired recent memory and impaired remote memory.        Judgment: Judgment normal.          Assessment & Plan:   Latent Tuberculosis:  Will initiate treatment with prescription for isoniazid 300 mg daily along with pyridoxine 50 mg daily.  I will bring him back in 1 month's time to recheck LFTs.  I am  asked him to also bring his medications when he comes back to clinic in 1 month's time.  I will plan on giving him a 58-monthtreatment course.  Stage renal disease on hemodialysis he has recently undergone AV graft placement the right arm currently getting dialysis through hemodialysis catheter  He did have an obvious murmur on exam I assume likely due to his mitral valve regurgitation.   Dementia: Apparently mild but initiated on Namenda.  He says that his wife helps him manage his medications by filling his pillboxes.    I spent 62 minutes with the patient including greater than 50% of time in face to face counseling of the patient re how we treat latent TB, tensional side effects from the medications personally reviewing his chest x-ray some April 16, 2021 February 29, 2020 and November 17, 2019 [place] hyperinflation consistent with COPD, [facility],  [facility], his primary care MD and his recent hospitalizations here as well as vascular surgery before and during the visit and in coordination of his care.

## 2021-05-17 ENCOUNTER — Telehealth: Payer: Self-pay

## 2021-05-17 NOTE — Telephone Encounter (Signed)
Anthony [name] with [facility] called to let us know pt had pain and swelling of R arm s/p AVG 3 days ago. Pt is currently at HD and not answering his phone when I tried to reach him. Anthony [name] did encourage him to elevate it. It sounds like he had not been doing that. She reported some relief when they were leaving his house. Will call pt again and try to reach him.

## 2021-05-17 NOTE — Chronic Care Management (AMB) (Signed)
   Care Management   Outreach Note  05/17/2021 Name: Fay Stefko MRN: [id number] DOB: 08-Nov-1940  Cristy Abila is a 80 y.o. year old male who is a primary care patient of Isaac Bliss, Rayford Halsted, MD. I reached out to [name] by phone today in response to a referral sent by Mr. Vasco Bunker PCP, Isaac Bliss, Rayford Halsted, MD      A second unsuccessful telephone outreach was attempted today. The patient was referred to the case management team for assistance with care management and care coordination.   Follow Up Plan: A HIPAA compliant phone message was left for the patient providing contact information and requesting a return call. The care management team will reach out to the patient again over the next 7 days.  If patient returns call to provider office, please advise to call [place] at [phone].  [facility] Management  Direct Dial: [phone]

## 2021-05-17 NOTE — Telephone Encounter (Signed)
Stanton Kidney, home health nurse called back to f/u. I have reached out to [facility] and spoke to [place] the HD nurse who is with pt at the present time. She said he is sleeping at dialysis and his arm swelling is WNL. She will let the pt know he is to report to ED if pain is not tolerable over the weekend. No further questions/ concerns. I have let Stanton Kidney know the above as well.

## 2021-05-24 NOTE — Chronic Care Management (AMB) (Signed)
  Care Management   Outreach Note  05/24/2021 Name: Anthony [name] MRN: [id number] DOB: [birth year]  Referred by: Isaac Bliss, Rayford Halsted, MD Reason for referral : Care Coordination (Initial outreach to schedule referral with [facility])   Third unsuccessful telephone outreach was attempted today. The patient was referred to the case management team for assistance with care management and care coordination. The patient's primary care provider has been notified of our unsuccessful attempts to make or maintain contact with the patient. The care management team is pleased to engage with this patient at any time in the future should he/she be interested in assistance from the care management team.   Follow Up Plan:  We have been unable to make contact with the patient. The care management team is available to follow up with the patient after provider conversation with the patient regarding recommendation for care management engagement and subsequent re-referral to the care management team. A HIPAA compliant phone message was left for the patient providing contact information and requesting a return call. If patient returns call to provider office, please advise to call [place] at [phone].  [place] Management  Direct Dial: [phone]

## 2021-06-07 ENCOUNTER — Other Ambulatory Visit: Payer: Self-pay | Admitting: Infectious Disease

## 2021-06-10 ENCOUNTER — Other Ambulatory Visit: Payer: Self-pay | Admitting: Internal Medicine

## 2021-06-10 DIAGNOSIS — G4700 Insomnia, unspecified: Secondary | ICD-10-CM

## 2021-06-10 NOTE — Telephone Encounter (Signed)
Patient's wife called on behalf of patient to ask for refill on patient's sleeping medication. Patient's wife did not know the name for it     Good callback number is [phone]  [id number] [place]  [phone] Fax:  [phone]      Please Advise

## 2021-06-12 MED ORDER — TRAZODONE HCL 50 MG PO TABS: 50.0000 mg | ORAL_TABLET | Freq: Every evening | ORAL | 1 refills | Status: AC | PRN

## 2021-06-12 NOTE — Telephone Encounter (Signed)
Rx sent 

## 2021-06-20 ENCOUNTER — Ambulatory Visit: Payer: BLUE CROSS/BLUE SHIELD | Admitting: Infectious Disease

## 2021-07-02 ENCOUNTER — Ambulatory Visit: Payer: BLUE CROSS/BLUE SHIELD | Admitting: Infectious Disease

## 2021-07-10 ENCOUNTER — Other Ambulatory Visit: Payer: Self-pay

## 2021-07-11 ENCOUNTER — Ambulatory Visit: Payer: BLUE CROSS/BLUE SHIELD | Admitting: Internal Medicine

## 2021-07-26 ENCOUNTER — Other Ambulatory Visit: Payer: Self-pay | Admitting: Infectious Disease

## 2021-07-26 ENCOUNTER — Other Ambulatory Visit: Payer: Self-pay

## 2021-07-26 DIAGNOSIS — Z992 Dependence on renal dialysis: Secondary | ICD-10-CM

## 2021-07-26 DIAGNOSIS — N186 End stage renal disease: Secondary | ICD-10-CM

## 2021-07-31 ENCOUNTER — Other Ambulatory Visit: Payer: Self-pay | Admitting: *Deleted

## 2021-07-31 DIAGNOSIS — Z992 Dependence on renal dialysis: Secondary | ICD-10-CM

## 2021-07-31 DIAGNOSIS — N186 End stage renal disease: Secondary | ICD-10-CM

## 2021-08-01 ENCOUNTER — Other Ambulatory Visit: Payer: Self-pay

## 2021-08-01 ENCOUNTER — Ambulatory Visit (INDEPENDENT_AMBULATORY_CARE_PROVIDER_SITE_OTHER): Payer: Medicare Other

## 2021-08-01 ENCOUNTER — Ambulatory Visit (INDEPENDENT_AMBULATORY_CARE_PROVIDER_SITE_OTHER): Payer: Medicare Other | Admitting: Infectious Disease

## 2021-08-01 VITALS — BP 174/82 | HR 78 | Temp 97.6°F | Wt 150.0 lb

## 2021-08-01 DIAGNOSIS — Z992 Dependence on renal dialysis: Secondary | ICD-10-CM | POA: Diagnosis not present

## 2021-08-01 DIAGNOSIS — I340 Nonrheumatic mitral (valve) insufficiency: Secondary | ICD-10-CM | POA: Diagnosis not present

## 2021-08-01 DIAGNOSIS — J42 Unspecified chronic bronchitis: Secondary | ICD-10-CM | POA: Diagnosis not present

## 2021-08-01 DIAGNOSIS — E440 Moderate protein-calorie malnutrition: Secondary | ICD-10-CM

## 2021-08-01 DIAGNOSIS — R7612 Nonspecific reaction to cell mediated immunity measurement of gamma interferon antigen response without active tuberculosis: Secondary | ICD-10-CM | POA: Diagnosis not present

## 2021-08-01 DIAGNOSIS — F0390 Unspecified dementia without behavioral disturbance: Secondary | ICD-10-CM

## 2021-08-01 DIAGNOSIS — N186 End stage renal disease: Secondary | ICD-10-CM | POA: Diagnosis not present

## 2021-08-01 DIAGNOSIS — Z227 Latent tuberculosis: Secondary | ICD-10-CM | POA: Diagnosis present

## 2021-08-01 DIAGNOSIS — I2510 Atherosclerotic heart disease of native coronary artery without angina pectoris: Secondary | ICD-10-CM

## 2021-08-01 DIAGNOSIS — F17200 Nicotine dependence, unspecified, uncomplicated: Secondary | ICD-10-CM

## 2021-08-01 DIAGNOSIS — Z7185 Encounter for immunization safety counseling: Secondary | ICD-10-CM | POA: Diagnosis not present

## 2021-08-01 DIAGNOSIS — Z23 Encounter for immunization: Secondary | ICD-10-CM | POA: Diagnosis present

## 2021-08-01 NOTE — Progress Notes (Signed)
   Covid-19 Vaccination Clinic  Name:  Anthony [name]    MRN: [id number] DOB: September 11, 1941  08/01/2021  [name] was observed post Covid-19 immunization for 15 minutes without incident. He was provided with Vaccine Information Sheet and instruction to access the V-Safe system.   [name] was instructed to call 911 with any severe reactions post vaccine: Difficulty breathing  Swelling of face and throat  A fast heartbeat  A bad rash all over body  Dizziness and weakness   Immunizations Administered     Name Date Dose VIS Date Route   Pfizer Covid-19 Vaccine Bivalent Booster 08/01/2021 11:31 AM 0.3 mL 05/22/2021 Intramuscular   Manufacturer: [place]   Lot: [id number]   [place]: [place], [place]

## 2021-08-01 NOTE — Progress Notes (Signed)
Subjective:   Chief complaint:   Patient ID: Anthony [name], male    DOB: 21-Jul-1941, 80 y.o.   MRN: [id number]  HPI  Anthony [name] is a 80 year old black man with a past medical history significant for congestive heart failure, end-stage renal disease on hemodialysis on Monday Wednesday Friday, hypertension stroke recent GI bleed dementia on Namenda who recently had a positive QuantiFERON gold test.  The reason for the QuantiFERON gold test was that he was seen by a physician at [facility] in [place].  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  QuantiFERON was repeated and positive at [place].  Chest x-ray was negative for any evidence of tuberculosis.  He did have chronic cough which is in part due to his continued smoking and COPD.  He was hospitalized here at [facility] with shortness of breath and need for hemodialysis.  He had apparently just moved from [place] to [place].  Lab done here included HIV test which was negative.  He himself does not know why a PPD was performed in 2021.  He was born in [place] and has largely lived in [place] and travel to the [place].  He was incarcerated for 2 years roughly 20 years ago.  He has never known himself to be around someone with known tuberculosis.  Denies fevers chills nausea malaise or weight loss.  He does not have a history of homelessness.  He has not traveled outside [place].  We initiated isoniazid with vitamin B6 at last visit.  He is tolerate this quite well.  He says he feels like he has better energy and feels stronger now.      Past Medical History:  Diagnosis Date   Alcohol abuse 2021   Arthritis    CHF (congestive heart failure) ([place])    Chicken pox    Chronic kidney disease    M W F   Dementia ([place])    mild   Dyspnea    ESRD (end stage renal disease) on dialysis [facility])    GI bleed    History of blood transfusion  03/2021   Hypertension    Insomnia    Mitral regurgitation 05/16/2021   Positive QuantiFERON-TB Gold test 05/16/2021   Stroke ([place])    TB lung, latent 05/16/2021    Past Surgical History:  Procedure Laterality Date   AV FISTULA PLACEMENT Right 05/14/2021   Procedure: INSERTION OF ARTERIOVENOUS (AV) GORE-TEX GRAFT ARM;  Surgeon: Angelia Mould, MD;  Location: [facility] OR;  Service: Vascular;  Laterality: Right;   ENTEROSCOPY N/A 04/18/2021   Procedure: ENTEROSCOPY;  Surgeon: Jackquline Denmark, MD;  Location: [place];  Service: Endoscopy;  Laterality: N/A;   HOT HEMOSTASIS N/A 04/18/2021   Procedure: HOT HEMOSTASIS (ARGON PLASMA COAGULATION/BICAP);  Surgeon: Jackquline Denmark, MD;  Location: [facility] ENDOSCOPY;  Service: Endoscopy;  Laterality: N/A;   SUBMUCOSAL TATTOO INJECTION  04/18/2021   Procedure: SUBMUCOSAL TATTOO INJECTION;  Surgeon: Jackquline Denmark, MD;  Location: [facility] ENDOSCOPY;  Service: Endoscopy;;    Family History  Problem Relation Age of Onset   Hypertension Mother    Hypertension Father       Social History   Socioeconomic History   Marital status: Married    Spouse name: Not on file   Number of children: Not on file   Years of education: Not on file   Highest education level: Not on file  Occupational History   Not on  file  Tobacco Use   Smoking status: Every Day    Packs/day: 0.25    Years: 61.00    Pack years: 15.25    Types: Cigarettes   Smokeless tobacco: Never  Vaping Use   Vaping Use: Never used  Substance and Sexual Activity   Alcohol use: Not Currently    Comment: quit 2021   Drug use: Not Currently   Sexual activity: Not Currently  Other Topics Concern   Not on file  Social History Narrative   Not on file   Social Determinants of Health   Financial Resource Strain: Not on file  Food Insecurity: Not on file  Transportation Needs: Not on file  Physical Activity: Not on file  Stress: Not on file  Social Connections: Not on file    No Known  Allergies   Current Outpatient Medications:    ADVAIR HFA 45-21 MCG/ACT inhaler, INHALE 2 PUFFS INTO THE LUNGS IN THE MORNING AND AT BEDTIME (Patient not taking: No sig reported), Disp: 36 each, Rfl: 2   albuterol (PROVENTIL HFA) 108 (90 Base) MCG/ACT inhaler, Inhale 2 puffs into the lungs every 4 (four) hours as needed for wheezing or shortness of breath., Disp: 18 g, Rfl: 0   ASPIRIN LOW DOSE 81 MG chewable tablet, Chew 81 mg by mouth daily., Disp: , Rfl:    atorvastatin (LIPITOR) 20 MG tablet, TAKE 1 TABLET BY MOUTH EVERYDAY AT BEDTIME, Disp: 90 tablet, Rfl: 1   budesonide-formoterol (SYMBICORT) 160-4.5 MCG/ACT inhaler, Inhale 2 puffs into the lungs 2 (two) times daily., Disp: , Rfl:    calcium acetate (PHOSLO) 667 MG capsule, Take 1,334 mg by mouth 3 (three) times daily with meals., Disp: , Rfl:    carboxymethylcellulose (REFRESH PLUS) 0.5 % SOLN, Place 1 drop into both eyes daily as needed (dry eyes)., Disp: , Rfl:    carvedilol (COREG) 6.25 MG tablet, Take 1 tablet (6.25 mg total) by mouth 2 (two) times daily with a meal., Disp: 60 tablet, Rfl: 0   hydrALAZINE (APRESOLINE) 50 MG tablet, Take 50 mg by mouth every 8 (eight) hours., Disp: , Rfl:    isoniazid (NYDRAZID) 300 MG tablet, TAKE 1 TABLET BY MOUTH EVERY DAY, Disp: 90 tablet, Rfl: 3   isosorbide mononitrate (IMDUR) 30 MG 24 hr tablet, Take 30 mg by mouth daily., Disp: , Rfl:    lidocaine-prilocaine (EMLA) cream, Apply 1 application topically as needed (port access)., Disp: , Rfl:    memantine (NAMENDA) 5 MG tablet, Take 5 mg by mouth daily., Disp: , Rfl:    Methoxy PEG-Epoetin Beta (MIRCERA IJ), Mircera, Disp: , Rfl:    nicotine (NICODERM CQ - DOSED IN MG/24 HOURS) 14 mg/24hr patch, Place 1 patch (14 mg total) onto the skin daily., Disp: 90 patch, Rfl: 1   oxyCODONE-acetaminophen (PERCOCET) 10-325 MG tablet, Take 1 tablet by mouth every 6 (six) hours as needed for pain., Disp: 20 tablet, Rfl: 0   pantoprazole (PROTONIX) 40 MG tablet,  Take 1 tablet (40 mg total) by mouth daily., Disp: , Rfl:    pyridOXINE (VITAMIN B-6) 50 MG tablet, Take 1 tablet (50 mg total) by mouth daily., Disp: 30 tablet, Rfl: 8   Tiotropium Bromide Monohydrate (SPIRIVA RESPIMAT) 2.5 MCG/ACT AERS, Inhale 2 puffs into the lungs daily., Disp: 4 g, Rfl: 3   traZODone (DESYREL) 50 MG tablet, Take 1-2 tablets (50-100 mg total) by mouth at bedtime as needed., Disp: 45 tablet, Rfl: 1    Review of Systems  Unable to  perform ROS: Dementia      Objective:   Physical Exam Constitutional:      Appearance: He is well-developed.  HENT:     Head: Normocephalic and atraumatic.  Eyes:     Conjunctiva/sclera: Conjunctivae normal.  Cardiovascular:     Rate and Rhythm: Normal rate and regular rhythm.     Heart sounds: Murmur heard.  Pulmonary:     Effort: Pulmonary effort is normal. No respiratory distress.     Breath sounds: No stridor. Rhonchi present. No wheezing.  Abdominal:     General: There is no distension.     Palpations: Abdomen is soft.  Musculoskeletal:        General: No tenderness. Normal range of motion.     Cervical back: Normal range of motion and neck supple.  Skin:    General: Skin is warm and dry.     Coloration: Skin is not pale.     Findings: No erythema or rash.  Neurological:     General: No focal deficit present.     Mental Status: He is alert and oriented to person, place, and time.  Psychiatric:        Mood and Affect: Mood normal.        Behavior: Behavior normal.        Thought Content: Thought content normal.        Judgment: Judgment normal.         Assessment & Plan:   Latent Tuberculosis: Latent tuberculosis:  I am checking LFTs today along with CBC with differential.  I will continue his isoniazid with vitamin B6 and see him back in 3 months time on a Tuesday.   Murmur: This appears stable likely due to [place] valve regurgitation.  End-stage renal disease on hemodialysis on Monday Wednesday  Friday.  COPD: Relatively stable to  Smoking: Have encouraged him to at minimum switch over from smoking conventional cigarettes to vaping to remove the tar and carbon monoxide another carcinogens besides the nicotine from being inhaled.    [place]: Wife manages medications. He is also taking Namenda.  Vaccine counseling have encouraged him to get COVID-19 updated booster which we will give him today.

## 2021-08-02 LAB — HEPATIC FUNCTION PANEL
AG Ratio: 1 (calc) (ref 1.0–2.5)
ALT: 7 U/L — ABNORMAL LOW (ref 9–46)
AST: 16 U/L (ref 10–35)
Albumin: 3.9 g/dL (ref 3.6–5.1)
Alkaline phosphatase (APISO): 72 U/L (ref 35–144)
Bilirubin, Direct: 0.1 mg/dL (ref 0.0–0.2)
Globulin: 3.9 g/dL (calc) — ABNORMAL HIGH (ref 1.9–3.7)
Indirect Bilirubin: 0.3 mg/dL (calc) (ref 0.2–1.2)
Total Bilirubin: 0.4 mg/dL (ref 0.2–1.2)
Total Protein: 7.8 g/dL (ref 6.1–8.1)

## 2021-08-02 LAB — CBC WITH DIFFERENTIAL/PLATELET
Absolute Monocytes: 634 cells/uL (ref 200–950)
Basophils Absolute: 49 cells/uL (ref 0–200)
Basophils Relative: 0.8 %
Eosinophils Absolute: 153 cells/uL (ref 15–500)
Eosinophils Relative: 2.5 %
HCT: 34.6 % — ABNORMAL LOW (ref 38.5–50.0)
Hemoglobin: 10.5 g/dL — ABNORMAL LOW (ref 13.2–17.1)
Lymphs Abs: 1110 cells/uL (ref 850–3900)
MCH: 27.3 pg (ref 27.0–33.0)
MCHC: 30.3 g/dL — ABNORMAL LOW (ref 32.0–36.0)
MCV: 90.1 fL (ref 80.0–100.0)
MPV: 9.7 fL (ref 7.5–12.5)
Monocytes Relative: 10.4 %
Neutro Abs: 4154 cells/uL (ref 1500–7800)
Neutrophils Relative %: 68.1 %
Platelets: 267 10*3/uL (ref 140–400)
RBC: 3.84 10*6/uL — ABNORMAL LOW (ref 4.20–5.80)
RDW: 16.9 % — ABNORMAL HIGH (ref 11.0–15.0)
Total Lymphocyte: 18.2 %
WBC: 6.1 10*3/uL (ref 3.8–10.8)

## 2021-08-08 ENCOUNTER — Encounter: Payer: Self-pay | Admitting: Vascular Surgery

## 2021-08-08 ENCOUNTER — Other Ambulatory Visit: Payer: Self-pay

## 2021-08-08 ENCOUNTER — Ambulatory Visit (INDEPENDENT_AMBULATORY_CARE_PROVIDER_SITE_OTHER): Payer: Medicare Other | Admitting: Vascular Surgery

## 2021-08-08 ENCOUNTER — Ambulatory Visit (HOSPITAL_COMMUNITY)
Admission: RE | Admit: 2021-08-08 | Discharge: 2021-08-08 | Disposition: A | Discharge status: Home / Self Care | Payer: Medicare Other | Source: Ambulatory Visit | Attending: Vascular Surgery | Admitting: Vascular Surgery

## 2021-08-08 ENCOUNTER — Ambulatory Visit (INDEPENDENT_AMBULATORY_CARE_PROVIDER_SITE_OTHER)
Admission: RE | Admit: 2021-08-08 | Discharge: 2021-08-08 | Disposition: A | Discharge status: Home / Self Care | Payer: Medicare Other | Source: Ambulatory Visit | Attending: Vascular Surgery | Admitting: Vascular Surgery

## 2021-08-08 VITALS — BP 162/125 | HR 83 | Temp 98.0°F | Resp 20 | Ht 67.0 in | Wt 150.0 lb

## 2021-08-08 DIAGNOSIS — Z992 Dependence on renal dialysis: Secondary | ICD-10-CM

## 2021-08-08 DIAGNOSIS — N186 End stage renal disease: Secondary | ICD-10-CM | POA: Insufficient documentation

## 2021-08-08 NOTE — Progress Notes (Signed)
Patient name: Anthony [name] MRN: [id number] DOB: 1941-08-04 Sex: male  REASON FOR VISIT:   Clotted right forearm AV graft.   HPI:   Anthony [name] is a pleasant 80 y.o. male who I placed a right forearm AV graft in on 05/14/2021.  He has a remote history of a left arm AV graft.  He has been having some pain and swelling in the right arm and was set for follow-up.  I think since the initial referral the right arm graft is clotted.  He comes in to be evaluated for new access.  He has a functioning right IJ tunneled dialysis catheter.  He is previously had an upper arm graft on the left.  He does have some dyspnea on exertion and is a smoker.  He denies any history of chest pain or chest pressure.  Current Outpatient Medications  Medication Sig Dispense Refill   ADVAIR HFA 45-21 MCG/ACT inhaler INHALE 2 PUFFS INTO THE LUNGS IN THE MORNING AND AT BEDTIME 36 each 2   albuterol (PROVENTIL HFA) 108 (90 Base) MCG/ACT inhaler Inhale 2 puffs into the lungs every 4 (four) hours as needed for wheezing or shortness of breath. 18 g 0   ASPIRIN LOW DOSE 81 MG chewable tablet Chew 81 mg by mouth daily.     atorvastatin (LIPITOR) 20 MG tablet TAKE 1 TABLET BY MOUTH EVERYDAY AT BEDTIME 90 tablet 1   budesonide-formoterol (SYMBICORT) 160-4.5 MCG/ACT inhaler Inhale 2 puffs into the lungs 2 (two) times daily.     calcium acetate (PHOSLO) 667 MG capsule Take 1,334 mg by mouth 3 (three) times daily with meals.     carboxymethylcellulose (REFRESH PLUS) 0.5 % SOLN Place 1 drop into both eyes daily as needed (dry eyes).     carvedilol (COREG) 6.25 MG tablet Take 1 tablet (6.25 mg total) by mouth 2 (two) times daily with a meal. 60 tablet 0   hydrALAZINE (APRESOLINE) 50 MG tablet Take 50 mg by mouth every 8 (eight) hours.     isoniazid (NYDRAZID) 300 MG tablet TAKE 1 TABLET BY MOUTH EVERY DAY 90 tablet 3   isosorbide mononitrate (IMDUR) 30 MG 24 hr tablet Take 30 mg by mouth daily.     lidocaine-prilocaine (EMLA)  cream Apply 1 application topically as needed (port access).     memantine (NAMENDA) 5 MG tablet Take 5 mg by mouth daily.     Methoxy PEG-Epoetin Beta (MIRCERA IJ) Mircera     nicotine (NICODERM CQ - DOSED IN MG/24 HOURS) 14 mg/24hr patch Place 1 patch (14 mg total) onto the skin daily. 90 patch 1   oxyCODONE-acetaminophen (PERCOCET) 10-325 MG tablet Take 1 tablet by mouth every 6 (six) hours as needed for pain. 20 tablet 0   pantoprazole (PROTONIX) 40 MG tablet Take 1 tablet (40 mg total) by mouth daily.     pyridOXINE (VITAMIN B-6) 50 MG tablet Take 1 tablet (50 mg total) by mouth daily. 30 tablet 8   Tiotropium Bromide Monohydrate (SPIRIVA RESPIMAT) 2.5 MCG/ACT AERS Inhale 2 puffs into the lungs daily. 4 g 3   traZODone (DESYREL) 50 MG tablet Take 1-2 tablets (50-100 mg total) by mouth at bedtime as needed. 45 tablet 1   No current facility-administered medications for this visit.    REVIEW OF SYSTEMS:  [X]  denotes positive finding, [ ]  denotes negative finding Vascular    Leg swelling    Cardiac    Chest pain or chest pressure:    Shortness of breath upon  exertion:    Short of breath when lying flat:    Irregular heart rhythm:    Constitutional    Fever or chills:     PHYSICAL EXAM:   Vitals:   08/08/21 1307  BP: (!) 162/125  Pulse: 83  Resp: 20  Temp: 98 F (36.7 C)  SpO2: 97%  Weight: 150 lb (68 kg)  Height: 5\' 7"  (1.702 m)    GENERAL: The patient is a well-nourished male, in no acute distress. The vital signs are documented above. CARDIOVASCULAR: There is a regular rate and rhythm. PULMONARY: There is good air exchange bilaterally without wheezing or rales. VASCULAR: He has a monophasic but fairly brisk radial signal on the right with a biphasic ulnar signal on the right.  He does have a palpable brachial pulse.  DATA:   ARTERIAL DUPLEX: I have independently interpreted his arterial duplex scan today.  On the right side there is a monophasic radial signal with  a biphasic ulnar signal.  The brachial artery measures 4.7 mm in diameter.  On the left side, there is a monophasic radial signal and monophasic ulnar signal.  The brachial artery measures 4.8 mm in diameter.  BILATERAL UPPER EXTREMITY VEIN MAP: I have independently interpreted his upper extremity vein map.  On the right side he does not appear to have any adequate vein in the upper arm or forearm for a fistula.  On the left side, he does not appear to have any adequate vein for an upper arm or forearm fistula.   MEDICAL ISSUES:   END-STAGE RENAL DISEASE: I think the next logical place for access would be a right upper arm graft.  He has no options for a fistula based on his vein map.  Given the monophasic radial signal I am concerned that he certainly is at risk for thrombosis of an upper arm graft also.  However I think this would be the next logical step.  Otherwise he would require consideration for a thigh graft.  He dialyzes on Monday Wednesdays and Fridays and we will schedule this for a nondialysis day, Tuesday, 09/19/2021.  Deitra Mayo [facility] [phone]

## 2021-08-14 ENCOUNTER — Other Ambulatory Visit: Payer: Self-pay

## 2021-08-17 ENCOUNTER — Emergency Department (HOSPITAL_COMMUNITY)
Admission: EM | Admit: 2021-08-17 | Discharge: 2021-08-17 | Disposition: A | Discharge status: Home / Self Care | Payer: Medicare Other | Attending: Emergency Medicine | Admitting: Emergency Medicine

## 2021-08-17 ENCOUNTER — Emergency Department (HOSPITAL_COMMUNITY): Payer: Medicare Other

## 2021-08-17 ENCOUNTER — Other Ambulatory Visit: Payer: Self-pay

## 2021-08-17 DIAGNOSIS — I2510 Atherosclerotic heart disease of native coronary artery without angina pectoris: Secondary | ICD-10-CM | POA: Diagnosis not present

## 2021-08-17 DIAGNOSIS — R531 Weakness: Secondary | ICD-10-CM | POA: Diagnosis present

## 2021-08-17 DIAGNOSIS — I132 Hypertensive heart and chronic kidney disease with heart failure and with stage 5 chronic kidney disease, or end stage renal disease: Secondary | ICD-10-CM | POA: Diagnosis not present

## 2021-08-17 DIAGNOSIS — N186 End stage renal disease: Secondary | ICD-10-CM | POA: Diagnosis not present

## 2021-08-17 DIAGNOSIS — I5032 Chronic diastolic (congestive) heart failure: Secondary | ICD-10-CM | POA: Diagnosis not present

## 2021-08-17 DIAGNOSIS — F03B0 Unspecified dementia, moderate, without behavioral disturbance, psychotic disturbance, mood disturbance, and anxiety: Secondary | ICD-10-CM | POA: Insufficient documentation

## 2021-08-17 DIAGNOSIS — Z992 Dependence on renal dialysis: Secondary | ICD-10-CM | POA: Insufficient documentation

## 2021-08-17 DIAGNOSIS — Z7951 Long term (current) use of inhaled steroids: Secondary | ICD-10-CM | POA: Insufficient documentation

## 2021-08-17 DIAGNOSIS — J441 Chronic obstructive pulmonary disease with (acute) exacerbation: Secondary | ICD-10-CM | POA: Insufficient documentation

## 2021-08-17 DIAGNOSIS — Z20822 Contact with and (suspected) exposure to covid-19: Secondary | ICD-10-CM | POA: Insufficient documentation

## 2021-08-17 DIAGNOSIS — Z7982 Long term (current) use of aspirin: Secondary | ICD-10-CM | POA: Insufficient documentation

## 2021-08-17 DIAGNOSIS — F17210 Nicotine dependence, cigarettes, uncomplicated: Secondary | ICD-10-CM | POA: Insufficient documentation

## 2021-08-17 DIAGNOSIS — Z79899 Other long term (current) drug therapy: Secondary | ICD-10-CM | POA: Diagnosis not present

## 2021-08-17 LAB — COMPREHENSIVE METABOLIC PANEL
ALT: 8 U/L (ref 0–44)
AST: 70 U/L — ABNORMAL HIGH (ref 15–41)
Albumin: 3.7 g/dL (ref 3.5–5.0)
Alkaline Phosphatase: 78 U/L (ref 38–126)
Anion gap: 13 (ref 5–15)
BUN: 32 mg/dL — ABNORMAL HIGH (ref 8–23)
CO2: 27 mmol/L (ref 22–32)
Calcium: 8.5 mg/dL — ABNORMAL LOW (ref 8.9–10.3)
Chloride: 92 mmol/L — ABNORMAL LOW (ref 98–111)
Creatinine, Ser: 7.06 mg/dL — ABNORMAL HIGH (ref 0.61–1.24)
GFR, Estimated: 7 mL/min — ABNORMAL LOW (ref >60)
Glucose, Bld: 84 mg/dL (ref 70–99)
Potassium: 4.8 mmol/L (ref 3.5–5.1)
Sodium: 132 mmol/L — ABNORMAL LOW (ref 135–145)
Total Bilirubin: 0.6 mg/dL (ref 0.3–1.2)
Total Protein: 8.7 g/dL — ABNORMAL HIGH (ref 6.5–8.1)

## 2021-08-17 LAB — BLOOD GAS, ARTERIAL
Acid-Base Excess: 4.3 mmol/L — ABNORMAL HIGH (ref 0.0–2.0)
Bicarbonate: 28.4 mmol/L — ABNORMAL HIGH (ref 20.0–28.0)
FIO2: 21
O2 Saturation: 94.1 %
Patient temperature: 98.6
pCO2 arterial: 42.4 mmHg (ref 32.0–48.0)
pH, Arterial: 7.441 (ref 7.350–7.450)
pO2, Arterial: 79.3 mmHg — ABNORMAL LOW (ref 83.0–108.0)

## 2021-08-17 LAB — BRAIN NATRIURETIC PEPTIDE: B Natriuretic Peptide: 267.3 pg/mL — ABNORMAL HIGH (ref 0.0–100.0)

## 2021-08-17 LAB — CBC WITH DIFFERENTIAL/PLATELET
Abs Immature Granulocytes: 0.06 10*3/uL (ref 0.00–0.07)
Basophils Absolute: 0 10*3/uL (ref 0.0–0.1)
Basophils Relative: 0 %
Eosinophils Absolute: 0 10*3/uL (ref 0.0–0.5)
Eosinophils Relative: 0 %
HCT: 36.2 % — ABNORMAL LOW (ref 39.0–52.0)
Hemoglobin: 10.8 g/dL — ABNORMAL LOW (ref 13.0–17.0)
Immature Granulocytes: 1 %
Lymphocytes Relative: 9 %
Lymphs Abs: 0.9 10*3/uL (ref 0.7–4.0)
MCH: 26.5 pg (ref 26.0–34.0)
MCHC: 29.8 g/dL — ABNORMAL LOW (ref 30.0–36.0)
MCV: 88.7 fL (ref 80.0–100.0)
Monocytes Absolute: 0.7 10*3/uL (ref 0.1–1.0)
Monocytes Relative: 7 %
Neutro Abs: 8.6 10*3/uL — ABNORMAL HIGH (ref 1.7–7.7)
Neutrophils Relative %: 83 %
Platelets: 240 10*3/uL (ref 150–400)
RBC: 4.08 MIL/uL — ABNORMAL LOW (ref 4.22–5.81)
RDW: 18.6 % — ABNORMAL HIGH (ref 11.5–15.5)
WBC: 10.3 10*3/uL (ref 4.0–10.5)
nRBC: 0.2 % (ref 0.0–0.2)

## 2021-08-17 LAB — RESP PANEL BY RT-PCR (FLU A&B, COVID) ARPGX2
Influenza A by PCR: NEGATIVE
Influenza B by PCR: NEGATIVE
SARS Coronavirus 2 by RT PCR: NEGATIVE

## 2021-08-17 LAB — LIPASE, BLOOD: Lipase: 36 U/L (ref 11–51)

## 2021-08-17 LAB — TROPONIN I (HIGH SENSITIVITY): Troponin I (High Sensitivity): 113 ng/L (ref <18)

## 2021-08-17 NOTE — ED Notes (Signed)
Date and time results received: 08/17/21 2141  Test: troponin Critical Value: 113  Name of Provider Notified: Eulis Foster  Orders Received? Or Actions Taken?: n/a

## 2021-08-17 NOTE — ED Notes (Signed)
Patient o2 sat dropping to 82% in triage. Patient roomed.

## 2021-08-17 NOTE — ED Provider Notes (Signed)
[facility] DEPT Provider Note   CSN: [id number] Arrival date & time: 08/17/21  1715     History No chief complaint on file.   Anthony [name] is a 80 y.o. male.  HPI Patient presents by EMS for evaluation of altered mental status, weakness and trouble breathing.  He is a poor historian.  EMS reports they encountered him after his dialysis today and encouraged him to come to the ED because of weakness.  He refused transport and they took him home.  Later EMS was summoned again and family members told them that they were concerned about patient's level of mentation, feeling that he was confused so encouraged EMS to bring him here.  He usually dialyzes on Monday Wednesday Friday, but missed yesterday, so was dialyzed today.  Level 5 caveat-altered mental status    Past Medical History:  Diagnosis Date   Alcohol abuse 2021   Arthritis    CHF (congestive heart failure) (HCC)    Chicken pox    Chronic kidney disease    M W F   Dementia [facility])    mild   Dyspnea    ESRD (end stage renal disease) on dialysis [facility])    GI bleed    History of blood transfusion 03/2021   Hypertension    Insomnia    Mitral regurgitation 05/16/2021   Positive QuantiFERON-TB Gold test 05/16/2021   Stroke ([place])    TB lung, latent 05/16/2021    Patient Active Problem List   Diagnosis Date Noted   Positive QuantiFERON-TB Gold test 05/16/2021   TB lung, latent 05/16/2021   Mitral regurgitation 05/16/2021   Malnutrition of moderate degree 04/18/2021   Anemia 04/16/2021   Symptomatic anemia 04/16/2021   COPD (chronic obstructive pulmonary disease) ([place]) 04/16/2021   CAD (coronary artery disease) 04/16/2021   HLD (hyperlipidemia) 04/16/2021   Prolonged QT interval 04/16/2021   Abnormal ECG 04/16/2021   Dementia without behavioral disturbance ([place]) 04/16/2021   COPD with acute exacerbation ([place]) 04/16/2021   Occult blood positive stool 04/16/2021   COPD exacerbation ([place])  03/01/2020   Tobacco use disorder 03/01/2020   Chronic diastolic heart failure (HCC)    PAF (paroxysmal atrial fibrillation) ([place])    Anemia of chronic disease 02/29/2020   Hypertensive urgency 02/29/2020   Elevated troponin 02/29/2020   Chest pain 02/29/2020   ESRD (end stage renal disease) on dialysis ([place]) 12/15/2019   HTN (hypertension) 12/15/2019   Insomnia     Past Surgical History:  Procedure Laterality Date   AV FISTULA PLACEMENT Right 05/14/2021   Procedure: INSERTION OF ARTERIOVENOUS (AV) GORE-TEX GRAFT ARM;  Surgeon: Angelia Mould, MD;  Location: [facility] OR;  Service: Vascular;  Laterality: Right;   ENTEROSCOPY N/A 04/18/2021   Procedure: ENTEROSCOPY;  Surgeon: Jackquline Denmark, MD;  Location: [facility] ENDOSCOPY;  Service: Endoscopy;  Laterality: N/A;   HOT HEMOSTASIS N/A 04/18/2021   Procedure: HOT HEMOSTASIS (ARGON PLASMA COAGULATION/BICAP);  Surgeon: Jackquline Denmark, MD;  Location: [facility] ENDOSCOPY;  Service: Endoscopy;  Laterality: N/A;   SUBMUCOSAL TATTOO INJECTION  04/18/2021   Procedure: SUBMUCOSAL TATTOO INJECTION;  Surgeon: Jackquline Denmark, MD;  Location: [facility] ENDOSCOPY;  Service: Endoscopy;;       Family History  Problem Relation Age of Onset   Hypertension Mother    Hypertension Father     Social History   Tobacco Use   Smoking status: Every Day    Packs/day: 0.25    Years: 61.00    Pack years: 15.25  Types: Cigarettes   Smokeless tobacco: Never  Vaping Use   Vaping Use: Never used  Substance Use Topics   Alcohol use: Not Currently    Comment: quit 2021   Drug use: Not Currently    Home Medications Prior to Admission medications   Medication Sig Start Date End Date Taking? Authorizing Provider  albuterol (PROVENTIL HFA) 108 (90 Base) MCG/ACT inhaler Inhale 2 puffs into the lungs every 4 (four) hours as needed for wheezing or shortness of breath. 03/08/20  Yes Laurey Morale, MD  ASPIRIN LOW DOSE 81 MG chewable tablet Chew 81 mg by mouth daily. 03/05/21  Yes  [provider]  budesonide-formoterol (SYMBICORT) 160-4.5 MCG/ACT inhaler Inhale 2 puffs into the lungs daily. 04/12/21  Yes [provider]  calcium acetate (PHOSLO) 667 MG capsule Take 667 mg by mouth 3 (three) times daily with meals.   Yes [provider]  carboxymethylcellulose (REFRESH PLUS) 0.5 % SOLN Place 1 drop into both eyes daily as needed (dry eyes). 12/07/20  Yes [provider]  carvedilol (COREG) 6.25 MG tablet Take 1 tablet (6.25 mg total) by mouth 2 (two) times daily with a meal. 03/02/20  Yes Black, Lezlie Octave, NP  hydrALAZINE (APRESOLINE) 50 MG tablet Take 50 mg by mouth every 8 (eight) hours.   Yes [provider]  isoniazid (NYDRAZID) 300 MG tablet TAKE 1 TABLET BY MOUTH EVERY DAY Patient taking differently: Take 300 mg by mouth daily. 07/26/21  Yes Tommy Medal, Lavell Islam, MD  isosorbide mononitrate (IMDUR) 30 MG 24 hr tablet Take 30 mg by mouth daily. 04/02/21  Yes [provider]  lidocaine-prilocaine (EMLA) cream Apply 1 application topically daily as needed (port access).   Yes [provider]  memantine (NAMENDA) 5 MG tablet Take 5 mg by mouth daily. 03/12/21  Yes [provider]  Methoxy PEG-Epoetin Beta (MIRCERA IJ) Mircera 05/13/21 05/12/22 Yes [provider]  pantoprazole (PROTONIX) 40 MG tablet Take 1 tablet (40 mg total) by mouth daily. 04/24/21  Yes Mariel Aloe, MD  pyridOXINE (VITAMIN B-6) 50 MG tablet Take 1 tablet (50 mg total) by mouth daily. 05/16/21 02/10/22 Yes Truman Hayward, MD  Tiotropium Bromide Monohydrate (SPIRIVA RESPIMAT) 2.5 MCG/ACT AERS Inhale 2 puffs into the lungs daily. 03/15/20  Yes Isaac Bliss, Rayford Halsted, MD  traZODone (DESYREL) 50 MG tablet Take 1-2 tablets (50-100 mg total) by mouth at bedtime as needed. Patient taking differently: Take 50-100 mg by mouth at bedtime as needed for sleep. 06/12/21  Yes Isaac Bliss, Rayford Halsted, MD  ADVAIR [facility] [id number] MCG/ACT inhaler  INHALE 2 PUFFS INTO THE LUNGS IN THE MORNING AND AT BEDTIME Patient not taking: Reported on 08/17/2021 01/17/21   Isaac Bliss, Rayford Halsted, MD  atorvastatin (LIPITOR) 20 MG tablet TAKE 1 TABLET BY MOUTH EVERYDAY AT BEDTIME Patient not taking: Reported on 08/17/2021 07/30/20   Isaac Bliss, Rayford Halsted, MD  nicotine (NICODERM CQ - DOSED IN MG/24 HOURS) 14 mg/24hr patch Place 1 patch (14 mg total) onto the skin daily. Patient not taking: Reported on 08/17/2021 03/15/20   Isaac Bliss, Rayford Halsted, MD  oxyCODONE-acetaminophen (PERCOCET) 10-325 MG tablet Take 1 tablet by mouth every 6 (six) hours as needed for pain. Patient not taking: Reported on 08/17/2021 05/14/21 05/14/22  Barbie Banner, PA-C    Allergies    Patient has no known allergies.  Review of Systems   Review of Systems  Unable to perform ROS: Mental status change  Physical Exam Updated Vital Signs BP (!) 144/74 (BP Location: Left Arm)   Pulse 77   Temp 97.8 F (36.6 C) (Oral)   Resp (!) 31   Ht 5\' 7"  (1.702 m)   Wt 68 kg   SpO2 96%   BMI 23.49 kg/m   Physical Exam Vitals and nursing note reviewed.  Constitutional:      General: He is not in acute distress.    Appearance: He is well-developed. He is not ill-appearing, toxic-appearing or diaphoretic.  HENT:     Head: Normocephalic and atraumatic.     Right Ear: External ear normal.     Left Ear: External ear normal.  Eyes:     Conjunctiva/sclera: Conjunctivae normal.     Pupils: Pupils are equal, round, and reactive to light.  Neck:     Trachea: Phonation normal.  Cardiovascular:     Rate and Rhythm: Normal rate and regular rhythm.     Heart sounds: Normal heart sounds.     Comments: Dialysis catheter, right upper chest wall, site and appliance appear normal. Pulmonary:     Effort: Pulmonary effort is normal. No respiratory distress.     Breath sounds: No stridor. Rhonchi present.  Chest:     Chest wall: No tenderness.  Abdominal:     General: There is  no distension.     Palpations: Abdomen is soft.     Tenderness: There is no abdominal tenderness.  Musculoskeletal:        General: Normal range of motion.     Cervical back: Normal range of motion and neck supple.  Skin:    General: Skin is warm and dry.  Neurological:     Mental Status: He is alert.     Cranial Nerves: No cranial nerve deficit.     Motor: No abnormal muscle tone.     Coordination: Coordination normal.  Psychiatric:        Mood and Affect: Mood normal.        Behavior: Behavior normal.    ED Results / Procedures / Treatments   Labs (all labs ordered are listed, but only abnormal results are displayed) Labs Reviewed  COMPREHENSIVE METABOLIC PANEL - Abnormal; Notable for the following components:      Result Value   Sodium 132 (*)    Chloride 92 (*)    BUN 32 (*)    Creatinine, Ser 7.06 (*)    Calcium 8.5 (*)    Total Protein 8.7 (*)    AST 70 (*)    GFR, Estimated 7 (*)    All other components within normal limits  CBC WITH DIFFERENTIAL/PLATELET - Abnormal; Notable for the following components:   RBC 4.08 (*)    Hemoglobin 10.8 (*)    HCT 36.2 (*)    MCHC 29.8 (*)    RDW 18.6 (*)    Neutro Abs 8.6 (*)    All other components within normal limits  BRAIN NATRIURETIC PEPTIDE - Abnormal; Notable for the following components:   B Natriuretic Peptide 267.3 (*)    All other components within normal limits  BLOOD GAS, ARTERIAL - Abnormal; Notable for the following components:   pO2, Arterial 79.3 (*)    Bicarbonate 28.4 (*)    Acid-Base Excess 4.3 (*)    All other components within normal limits  TROPONIN I (HIGH SENSITIVITY) - Abnormal; Notable for the following components:   Troponin I (High Sensitivity) 113 (*)    All other components within normal limits  RESP PANEL BY RT-PCR (FLU A&B, COVID) ARPGX2  LIPASE, BLOOD  URINALYSIS, ROUTINE W REFLEX MICROSCOPIC  TROPONIN I (HIGH SENSITIVITY)    EKG EKG Interpretation  Date/Time:  Saturday August 17 2021 18:37:51 EST Ventricular Rate:  76 PR Interval:  154 QRS Duration: 90 QT Interval:  430 QTC Calculation: 484 R Axis:   -59 Text Interpretation: Sinus rhythm Left anterior fascicular block Anteroseptal infarct, old Nonspecific T abnormalities, lateral leads since last tracing no significant change Confirmed by Daleen Bo [phone]) on 08/17/2021 7:40:41 PM  Radiology DG Chest 2 View  Result Date: 08/17/2021 CLINICAL DATA:  Shortness of breath.  Cough. EXAM: CHEST - 2 VIEW COMPARISON:  Chest x-ray 04/16/2021. FINDINGS: There are minimal patchy opacities in the right lung base. There is stable atelectasis or scarring in the left lung base. There is no pleural effusion or pneumothorax. Right chest port catheter tip projects over the SVC. Cardiomediastinal silhouette is within normal limits. Left axillary vascular stent is again noted. IMPRESSION: Minimal patchy opacities in the right lung base worrisome for infection. Electronically Signed   By: Ronney Asters M.D.   On: 08/17/2021 18:43    Procedures Procedures   Medications Ordered in ED Medications - No data to display  ED Course  I have reviewed the triage vital signs and the nursing notes.  Pertinent labs & imaging results that were available during my care of the patient were reviewed by me and considered in my medical decision making (see chart for details).  Clinical Course as of 08/17/21 2228  Sat Aug 17, 2021  2223 I was able to reach the patient's wife, by telephone.  We discussed the findings and that the patient could come home.  She states she will send her daughter to come get the patient [EW]    Clinical Course User Index [EW] Daleen Bo, MD   MDM Rules/Calculators/A&P                            Patient Vitals for the past 24 hrs:  BP Temp Temp src Pulse Resp SpO2 Height Weight  08/17/21 2223 (!) 144/74 97.8 F (36.6 C) Oral 77 (!) 31 96 % -- --  08/17/21 1955 (!) 147/88 -- -- 71 16 99 % -- --   08/17/21 1845 -- -- -- 77 20 98 % -- --  08/17/21 1736 112/64 97.7 F (36.5 C) Oral 82 18 98 % -- --  08/17/21 1728 -- -- -- -- -- -- 5\' 7"  (1.702 m) 68 kg    10:28 PM Reevaluation with update and discussion. After initial assessment and treatment, an updated evaluation reveals he states that his abdominal pain has resolved. Daleen Bo   Medical Decision Making:  This patient is presenting for evaluation of weakness and confusion, which does require a range of treatment options, and is a complaint that involves a moderate risk of morbidity and mortality. The differential diagnoses include complications of end-stage renal disease, weakness related to dialysis, nonspecific illness. I decided to review old records, and in summary elderly male with history of end-stage renal disease, hypertension, anemia, hypertensive urgency, paroxysmal atrial fibrillation and COPD.  He also has documented dementia with behavioral disturbance.  I obtained additional historical information from patient's wife by telephone.  Clinical Laboratory Tests Ordered, included CBC, Metabolic panel, and lipase, troponin, BNP, arterial blood gas . Viral panel. Review indicates normal except sodium low, chloride low, BUN high, creatinine high, total  protein high, AST high, hemoglobin low, BNP high, troponin high, PO2 slightly low. Radiologic Tests Ordered, included chest x-ray.  I independently Visualized: Radiograph images, which show mild patchy opacities right lower lobe  Cardiac Monitor Tracing which shows normal sinus rhythm    Critical Interventions-clinical evaluation, laboratory testing, radiography, observation reassessment  After These Interventions, the Patient was reevaluated and was found stable for discharge.  Nonspecific symptoms.  Troponin elevated, chronically.  EKG does not indicate acute coronary abnormalities.  Screening blood work is consistent with end-stage renal disease, uncomplicated.  Arterial  blood gas does not show hypoxia.  Suspect weakness related to dialysis treatment today.  Patient with dementia and chronic altered mental status.  CRITICAL CARE-no Performed by: Daleen Bo  Nursing Notes Reviewed/ Care Coordinated Applicable Imaging Reviewed Interpretation of Laboratory Data incorporated into ED treatment  The patient appears reasonably screened and/or stabilized for discharge and I doubt any other medical condition or other [facility] requiring further screening, evaluation, or treatment in the ED at this time prior to discharge.  Plan: Home Medications-continue usual; Home Treatments-regular diet and activity; return here if the recommended treatment, does not improve the symptoms; Recommended follow up-PCP and renal, as needed      Final Clinical Impression(s) / ED Diagnoses Final diagnoses:  Weakness  Moderate dementia, unspecified dementia type, unspecified whether behavioral, psychotic, or mood disturbance or anxiety    Rx / DC Orders ED Discharge Orders     None        Daleen Bo, MD 08/17/21 2230

## 2021-08-17 NOTE — ED Triage Notes (Signed)
[facility], [facility] states patient had dialysis today which is one day later that usual, after dialysis patient was weak. Rhonchi in lower lung bases. Orthostatic changes. Patient denies pain in triage.

## 2021-08-17 NOTE — Discharge Instructions (Addendum)
The testing today did not show any particular complications.  Make sure he has plenty of rest drinks a lot of fluid and needs a regular renal diet.  Follow-up with his medical doctor or nephrologist for further care and treatment as needed.

## 2021-08-17 NOTE — ED Notes (Signed)
Attempted to call IV team at 3 phone numbers listed, no response. Tried at [phone], [phone], [phone].

## 2021-08-17 NOTE — ED Provider Notes (Signed)
Emergency Medicine Provider Triage Evaluation Note  Anthony [name] , a 80 y.o. male  was evaluated in triage.  Pt complains of SOB, weakness, nonproductive wet sounding cough since yesterday. Epigastric pain.  Dialysis dependent.   Review of Systems  Positive: SOB, weakness, cough,  Negative: Fevers, chills  Physical Exam  BP 112/64   Pulse 82   Temp 97.7 F (36.5 C) (Oral)   Resp 18   Ht 5\' 7"  (1.702 m)   Wt 68 kg   SpO2 98%   BMI 23.49 kg/m  Gen:   Awake, no distress   Resp:  Normal effort  MSK:   Moves extremities without difficulty  Other:  Lungs with rhonchorous breath sounds bilaterally.  Abdomen tender in the epigastrium, left and upper right quadrants, no CVAT.  RRR no   Medical Decision Making  Medically screening exam initiated at 6:15 PM.  Appropriate orders placed.  Arlo Butt was informed that the remainder of the evaluation will be completed by another provider, this initial triage assessment does not replace that evaluation, and the importance of remaining in the ED until their evaluation is complete.  This chart was dictated using voice recognition software, Dragon. Despite the best efforts of this provider to proofread and correct errors, errors may still occur which can change documentation meaning.    Emeline Darling, PA-C 08/17/21 1817    Daleen Bo, MD 08/18/21 1339

## 2021-08-17 NOTE — ED Notes (Signed)
IV team at bedside 

## 2021-08-22 ENCOUNTER — Ambulatory Visit: Payer: Medicare Other | Admitting: Cardiology

## 2021-08-23 ENCOUNTER — Encounter (HOSPITAL_COMMUNITY): Payer: Self-pay | Admitting: Emergency Medicine

## 2021-08-23 ENCOUNTER — Emergency Department (HOSPITAL_COMMUNITY): Payer: Medicare Other

## 2021-08-23 ENCOUNTER — Emergency Department (HOSPITAL_COMMUNITY)
Admission: EM | Admit: 2021-08-23 | Discharge: 2021-08-23 | Disposition: A | Discharge status: Home / Self Care | Payer: Medicare Other | Attending: Emergency Medicine | Admitting: Emergency Medicine

## 2021-08-23 ENCOUNTER — Telehealth: Payer: Self-pay | Admitting: Surgery

## 2021-08-23 DIAGNOSIS — I2510 Atherosclerotic heart disease of native coronary artery without angina pectoris: Secondary | ICD-10-CM | POA: Insufficient documentation

## 2021-08-23 DIAGNOSIS — I5032 Chronic diastolic (congestive) heart failure: Secondary | ICD-10-CM | POA: Diagnosis not present

## 2021-08-23 DIAGNOSIS — N186 End stage renal disease: Secondary | ICD-10-CM | POA: Insufficient documentation

## 2021-08-23 DIAGNOSIS — Z20822 Contact with and (suspected) exposure to covid-19: Secondary | ICD-10-CM | POA: Diagnosis not present

## 2021-08-23 DIAGNOSIS — F0390 Unspecified dementia without behavioral disturbance: Secondary | ICD-10-CM | POA: Diagnosis not present

## 2021-08-23 DIAGNOSIS — F17210 Nicotine dependence, cigarettes, uncomplicated: Secondary | ICD-10-CM | POA: Diagnosis not present

## 2021-08-23 DIAGNOSIS — Z7951 Long term (current) use of inhaled steroids: Secondary | ICD-10-CM | POA: Diagnosis not present

## 2021-08-23 DIAGNOSIS — R0602 Shortness of breath: Secondary | ICD-10-CM

## 2021-08-23 DIAGNOSIS — Z992 Dependence on renal dialysis: Secondary | ICD-10-CM | POA: Diagnosis not present

## 2021-08-23 DIAGNOSIS — J101 Influenza due to other identified influenza virus with other respiratory manifestations: Secondary | ICD-10-CM | POA: Insufficient documentation

## 2021-08-23 DIAGNOSIS — I132 Hypertensive heart and chronic kidney disease with heart failure and with stage 5 chronic kidney disease, or end stage renal disease: Secondary | ICD-10-CM | POA: Diagnosis not present

## 2021-08-23 DIAGNOSIS — Z79899 Other long term (current) drug therapy: Secondary | ICD-10-CM | POA: Diagnosis not present

## 2021-08-23 DIAGNOSIS — R079 Chest pain, unspecified: Secondary | ICD-10-CM

## 2021-08-23 DIAGNOSIS — Z7982 Long term (current) use of aspirin: Secondary | ICD-10-CM | POA: Insufficient documentation

## 2021-08-23 DIAGNOSIS — J449 Chronic obstructive pulmonary disease, unspecified: Secondary | ICD-10-CM | POA: Insufficient documentation

## 2021-08-23 LAB — CBC WITH DIFFERENTIAL/PLATELET
Abs Immature Granulocytes: 0.04 10*3/uL (ref 0.00–0.07)
Basophils Absolute: 0 10*3/uL (ref 0.0–0.1)
Basophils Relative: 0 %
Eosinophils Absolute: 0.3 10*3/uL (ref 0.0–0.5)
Eosinophils Relative: 5 %
HCT: 34.4 % — ABNORMAL LOW (ref 39.0–52.0)
Hemoglobin: 9.9 g/dL — ABNORMAL LOW (ref 13.0–17.0)
Immature Granulocytes: 1 %
Lymphocytes Relative: 14 %
Lymphs Abs: 0.8 10*3/uL (ref 0.7–4.0)
MCH: 27 pg (ref 26.0–34.0)
MCHC: 28.8 g/dL — ABNORMAL LOW (ref 30.0–36.0)
MCV: 93.7 fL (ref 80.0–100.0)
Monocytes Absolute: 0.5 10*3/uL (ref 0.1–1.0)
Monocytes Relative: 9 %
Neutro Abs: 3.9 10*3/uL (ref 1.7–7.7)
Neutrophils Relative %: 71 %
Platelets: 159 10*3/uL (ref 150–400)
RBC: 3.67 MIL/uL — ABNORMAL LOW (ref 4.22–5.81)
RDW: 21.2 % — ABNORMAL HIGH (ref 11.5–15.5)
WBC: 5.4 10*3/uL (ref 4.0–10.5)
nRBC: 0 % (ref 0.0–0.2)

## 2021-08-23 LAB — COMPREHENSIVE METABOLIC PANEL
ALT: <5 U/L (ref 0–44)
AST: 14 U/L — ABNORMAL LOW (ref 15–41)
Albumin: 2.9 g/dL — ABNORMAL LOW (ref 3.5–5.0)
Alkaline Phosphatase: 69 U/L (ref 38–126)
Anion gap: 15 (ref 5–15)
BUN: 49 mg/dL — ABNORMAL HIGH (ref 8–23)
CO2: 24 mmol/L (ref 22–32)
Calcium: 9.4 mg/dL (ref 8.9–10.3)
Chloride: 98 mmol/L (ref 98–111)
Creatinine, Ser: 12.65 mg/dL — ABNORMAL HIGH (ref 0.61–1.24)
GFR, Estimated: 4 mL/min — ABNORMAL LOW (ref >60)
Glucose, Bld: 119 mg/dL — ABNORMAL HIGH (ref 70–99)
Potassium: 4 mmol/L (ref 3.5–5.1)
Sodium: 137 mmol/L (ref 135–145)
Total Bilirubin: 0.5 mg/dL (ref 0.3–1.2)
Total Protein: 7.1 g/dL (ref 6.5–8.1)

## 2021-08-23 LAB — RESP PANEL BY RT-PCR (FLU A&B, COVID) ARPGX2
Influenza A by PCR: POSITIVE — AB
Influenza B by PCR: NEGATIVE
SARS Coronavirus 2 by RT PCR: NEGATIVE

## 2021-08-23 LAB — BRAIN NATRIURETIC PEPTIDE: B Natriuretic Peptide: 409.2 pg/mL — ABNORMAL HIGH (ref 0.0–100.0)

## 2021-08-23 LAB — TROPONIN I (HIGH SENSITIVITY)
Troponin I (High Sensitivity): 228 ng/L (ref <18)
Troponin I (High Sensitivity): 248 ng/L (ref <18)

## 2021-08-23 MED ORDER — HYDRALAZINE HCL 25 MG PO TABS
50.0000 mg | ORAL_TABLET | Freq: Once | ORAL | Status: AC
  Administered 2021-08-23: 50 mg via ORAL
  Filled 2021-08-23: qty 2

## 2021-08-23 NOTE — ED Provider Notes (Signed)
Emergency Medicine Provider Triage Evaluation Note  Anthony [name] , a 80 y.o. male  was evaluated in triage.  Pt complains of shortness of breath.  Patient brought in by EMS from dialysis center.  Was given 5 mg albuterol from EMS.  He states that he has been short of breath for the past 2 days with new cough.  He also endorses chest pain and palpitations.  He denies lightheadedness or dizziness.  He states that his wife recently had upper respiratory infection and now he has started having symptoms.  Review of Systems  Positive: See above Negative:   Physical Exam  BP (!) 148/85 (BP Location: Left Arm)   Pulse 68   Temp 98 F (36.7 C) (Oral)   SpO2 96%  Gen:   Awake, no distress   Resp:  Tachypnea, with intermittent pursed lip breathing.  Bilateral lower lobe crackles.  No wheezing bilaterally.  MSK:   Moves extremities without difficulty  Other:  S1/S2 without murmur  Medical Decision Making  Medically screening exam initiated at 2:22 PM.  Appropriate orders placed.  Anthony [name] was informed that the remainder of the evaluation will be completed by another provider, this initial triage assessment does not replace that evaluation, and the importance of remaining in the ED until their evaluation is complete.     Anthony Hillier, PA-C 08/23/21 1423    [name], Anthony Critchley, DO 08/23/21 1644

## 2021-08-23 NOTE — Discharge Instructions (Addendum)
Please call the office number above to schedule a follow-up appointment with your cardiologist.  As I explained, it is very important that you see a specialist for your heart health, especially with your episodes of chest pain.  Our records show that you have not seen the cardiologist in over 1 year (since June 2021).  Please contact their office tomorrow or early Monday morning to ask for the next available appointment.  You were seen by Dr. Radford Pax and her NP provider Truitt Merle on your last visit.

## 2021-08-23 NOTE — ED Triage Notes (Signed)
Patient BIB [facility] from dialysis center for evaluation of shortness of breath. Received 5mg  albuterol from EMS. Patient reports his wife at home also has a respiratory infection but is unsure if she has a diagnosis. Patient alert, oriented, and in no apparent distress at this time.   140/90 CBG 160 SpO2 98% on 4L

## 2021-08-23 NOTE — ED Provider Notes (Signed)
[facility] EMERGENCY DEPARTMENT Provider Note   CSN: [id number] Arrival date & time: 08/23/21  1232     History Chief Complaint  Patient presents with   Shortness of Breath    Anthony [name] is a 80 y.o. male with history of end-stage renal disease on dialysis presented emergency department with chest pain and shortness of breath.  The patient reports that he was having some centralized chest pain earlier today which is since gone away.  He said he has a similar episodes in the past that generally go away.  He was at dialysis today and during the end of dialysis he began to feel short of breath.  He subsequently is brought to the ED for evaluation.  He states he now feels completely back to normal.  He says his wife had a respiratory infection at home but he has been feeling well otherwise.  EMS gave the patient 5 mg of albuterol in route to the hospital.  Patient ports he is asymptomatic repeatedly asked me for food.  HPI     Past Medical History:  Diagnosis Date   Alcohol abuse 2021   Arthritis    CHF (congestive heart failure) ([place])    Chicken pox    Chronic kidney disease    M W F   Dementia ([place])    mild   Dyspnea    ESRD (end stage renal disease) on dialysis [facility])    GI bleed    History of blood transfusion 03/2021   Hypertension    Insomnia    Mitral regurgitation 05/16/2021   Positive QuantiFERON-TB Gold test 05/16/2021   Stroke ([place])    TB lung, latent 05/16/2021    Patient Active Problem List   Diagnosis Date Noted   Positive QuantiFERON-TB Gold test 05/16/2021   TB lung, latent 05/16/2021   Mitral regurgitation 05/16/2021   Malnutrition of moderate degree 04/18/2021   Anemia 04/16/2021   Symptomatic anemia 04/16/2021   COPD (chronic obstructive pulmonary disease) ([place]) 04/16/2021   CAD (coronary artery disease) 04/16/2021   HLD (hyperlipidemia) 04/16/2021   Prolonged QT interval 04/16/2021   Abnormal ECG 04/16/2021   Dementia without  behavioral disturbance ([place]) 04/16/2021   COPD with acute exacerbation ([place]) 04/16/2021   Occult blood positive stool 04/16/2021   COPD exacerbation ([place]) 03/01/2020   Tobacco use disorder 03/01/2020   Chronic diastolic heart failure ([facility])    PAF (paroxysmal atrial fibrillation) ([place])    Anemia of chronic disease 02/29/2020   Hypertensive urgency 02/29/2020   Elevated troponin 02/29/2020   Chest pain 02/29/2020   ESRD (end stage renal disease) on dialysis ([place]) 12/15/2019   HTN (hypertension) 12/15/2019   Insomnia     Past Surgical History:  Procedure Laterality Date   AV FISTULA PLACEMENT Right 05/14/2021   Procedure: INSERTION OF ARTERIOVENOUS (AV) GORE-TEX GRAFT ARM;  Surgeon: Angelia Mould, MD;  Location: [facility] OR;  Service: Vascular;  Laterality: Right;   ENTEROSCOPY N/A 04/18/2021   Procedure: ENTEROSCOPY;  Surgeon: Jackquline Denmark, MD;  Location[facility] ENDOSCOPY;  Service: Endoscopy;  Laterality: N/A;   HOT HEMOSTASIS N/A 04/18/2021   Procedure: HOT HEMOSTASIS (ARGON PLASMA COAGULATION/BICAP);  Surgeon: Jackquline Denmark, MD;  Location[facility] ENDOSCOPY;  Service: Endoscopy;  Laterality: N/A;   SUBMUCOSAL TATTOO INJECTION  04/18/2021   Procedure: SUBMUCOSAL TATTOO INJECTION;  Surgeon: Jackquline Denmark, MD;  Location: [facility] ENDOSCOPY;  Service: Endoscopy;;       Family History  Problem Relation Age of Onset   Hypertension  Mother    Hypertension Father     Social History   Tobacco Use   Smoking status: Every Day    Packs/day: 0.25    Years: 61.00    Pack years: 15.25    Types: Cigarettes   Smokeless tobacco: Never  Vaping Use   Vaping Use: Never used  Substance Use Topics   Alcohol use: Not Currently    Comment: quit 2021   Drug use: Not Currently    Home Medications Prior to Admission medications   Medication Sig Start Date End Date Taking? Authorizing Provider  ADVAIR HFA [phone] MCG/ACT inhaler INHALE 2 PUFFS INTO THE LUNGS IN THE MORNING AND AT BEDTIME Patient not  taking: Reported on 08/17/2021 01/17/21   Isaac Bliss, Rayford Halsted, MD  albuterol (PROVENTIL HFA) 108 (90 Base) MCG/ACT inhaler Inhale 2 puffs into the lungs every 4 (four) hours as needed for wheezing or shortness of breath. 03/08/20   Laurey Morale, MD  ASPIRIN LOW DOSE 81 MG chewable tablet Chew 81 mg by mouth daily. 03/05/21   [provider]  atorvastatin (LIPITOR) 20 MG tablet TAKE 1 TABLET BY MOUTH EVERYDAY AT BEDTIME Patient not taking: Reported on 08/17/2021 07/30/20   Isaac Bliss, Rayford Halsted, MD  budesonide-formoterol [facility]) 160-4.5 MCG/ACT inhaler Inhale 2 puffs into the lungs daily. 04/12/21   [provider]  calcium acetate (PHOSLO) 667 MG capsule Take 667 mg by mouth 3 (three) times daily with meals.    [provider]  carboxymethylcellulose (REFRESH PLUS) 0.5 % SOLN Place 1 drop into both eyes daily as needed (dry eyes). 12/07/20   [provider]  carvedilol (COREG) 6.25 MG tablet Take 1 tablet (6.25 mg total) by mouth 2 (two) times daily with a meal. 03/02/20   Black, Lezlie Octave, NP  hydrALAZINE (APRESOLINE) 50 MG tablet Take 50 mg by mouth every 8 (eight) hours.    [provider]  isoniazid (NYDRAZID) 300 MG tablet TAKE 1 TABLET BY MOUTH EVERY DAY Patient taking differently: Take 300 mg by mouth daily. 07/26/21   Truman Hayward, MD  isosorbide mononitrate (IMDUR) 30 MG 24 hr tablet Take 30 mg by mouth daily. 04/02/21   [provider]  lidocaine-prilocaine (EMLA) cream Apply 1 application topically daily as needed (port access).    [provider]  memantine (NAMENDA) 5 MG tablet Take 5 mg by mouth daily. 03/12/21   [provider]  Methoxy PEG-Epoetin Beta (MIRCERA IJ) Mircera 05/13/21 05/12/22  [provider]  nicotine (NICODERM CQ - DOSED IN MG/24 HOURS) 14 mg/24hr patch Place 1 patch (14 mg total) onto the skin daily. Patient not taking: Reported on 08/17/2021 03/15/20   Isaac Bliss,  Rayford Halsted, MD  oxyCODONE-acetaminophen (PERCOCET) 10-325 MG tablet Take 1 tablet by mouth every 6 (six) hours as needed for pain. Patient not taking: Reported on 08/17/2021 05/14/21 05/14/22  Vaughan Basta, Edman Circle, PA-C  pantoprazole (PROTONIX) 40 MG tablet Take 1 tablet (40 mg total) by mouth daily. 04/24/21   Mariel Aloe, MD  pyridOXINE (VITAMIN B-6) 50 MG tablet Take 1 tablet (50 mg total) by mouth daily. 05/16/21 02/10/22  Truman Hayward, MD  Tiotropium Bromide Monohydrate (SPIRIVA RESPIMAT) 2.5 MCG/ACT AERS Inhale 2 puffs into the lungs daily. 03/15/20   Isaac Bliss, Rayford Halsted, MD  traZODone (DESYREL) 50 MG tablet Take 1-2 tablets (50-100 mg total) by mouth at bedtime as needed. Patient taking differently: Take 50-100 mg by mouth at bedtime as needed  for sleep. 06/12/21   Isaac Bliss, Rayford Halsted, MD    Allergies    Patient has no known allergies.  Review of Systems   Review of Systems  Constitutional:  Negative for chills and fever.  HENT:  Negative for ear pain and sore throat.   Eyes:  Negative for pain and visual disturbance.  Respiratory:  Negative for cough and shortness of breath.   Cardiovascular:  Negative for chest pain and palpitations.  Gastrointestinal:  Negative for abdominal pain and vomiting.  Genitourinary:  Negative for dysuria and hematuria.  Musculoskeletal:  Negative for arthralgias and back pain.  Skin:  Negative for color change and rash.  Neurological:  Negative for seizures and syncope.  All other systems reviewed and are negative.  Physical Exam Updated Vital Signs BP (!) 168/82   Pulse (!) 59   Temp 98 F (36.7 C) (Oral)   Resp 15   SpO2 97%   Physical Exam Constitutional:      General: He is not in acute distress. HENT:     Head: Normocephalic and atraumatic.  Eyes:     Conjunctiva/sclera: Conjunctivae normal.     Pupils: Pupils are equal, round, and reactive to light.  Cardiovascular:     Rate and Rhythm: Normal rate and regular rhythm.   Pulmonary:     Effort: Pulmonary effort is normal. No respiratory distress.  Abdominal:     General: There is no distension.     Tenderness: There is no abdominal tenderness.  Skin:    General: Skin is warm and dry.  Neurological:     General: No focal deficit present.     Mental Status: He is alert. Mental status is at baseline.  Psychiatric:        Mood and Affect: Mood normal.        Behavior: Behavior normal.    ED Results / Procedures / Treatments   Labs (all labs ordered are listed, but only abnormal results are displayed) Labs Reviewed  RESP PANEL BY RT-PCR (FLU A&B, COVID) ARPGX2 - Abnormal; Notable for the following components:      Result Value   Influenza A by PCR POSITIVE (*)    All other components within normal limits  COMPREHENSIVE METABOLIC PANEL - Abnormal; Notable for the following components:   Glucose, Bld 119 (*)    BUN 49 (*)    Creatinine, Ser 12.65 (*)    Albumin 2.9 (*)    AST 14 (*)    GFR, Estimated 4 (*)    All other components within normal limits  CBC WITH DIFFERENTIAL/PLATELET - Abnormal; Notable for the following components:   RBC 3.67 (*)    Hemoglobin 9.9 (*)    HCT 34.4 (*)    MCHC 28.8 (*)    RDW 21.2 (*)    All other components within normal limits  BRAIN NATRIURETIC PEPTIDE - Abnormal; Notable for the following components:   B Natriuretic Peptide 409.2 (*)    All other components within normal limits  TROPONIN I (HIGH SENSITIVITY) - Abnormal; Notable for the following components:   Troponin I (High Sensitivity) 228 (*)    All other components within normal limits  TROPONIN I (HIGH SENSITIVITY) - Abnormal; Notable for the following components:   Troponin I (High Sensitivity) 248 (*)    All other components within normal limits    EKG EKG Interpretation  Date/Time:  Friday August 23 2021 14:22:26 EST Ventricular Rate:  60 PR Interval:  168 QRS Duration:  94 QT Interval:  450 QTC Calculation: 450 R Axis:   -36 Text  Interpretation: Sinus rhythm with Blocked Premature atrial complexes Left axis deviation  No sig change from prior tracing Confirmed by Octaviano Glow [phone]) on 08/23/2021 5:50:28 PM  Radiology DG Chest 2 View  Result Date: 08/23/2021 CLINICAL DATA:  Shortness of breath EXAM: CHEST - 2 VIEW COMPARISON:  Chest x-ray 08/17/2021 FINDINGS: Heart is mildly enlarged. Mediastinum appears stable. Calcified plaques in the aortic arch. Right-sided central line is stable. Pulmonary vasculature is normal. No focal consolidation identified. No pleural effusion or pneumothorax. IMPRESSION: Mild cardiomegaly with no acute process identified. Electronically Signed   By: Ofilia Neas M.D.   On: 08/23/2021 15:52    Procedures Procedures   Medications Ordered in ED Medications  hydrALAZINE (APRESOLINE) tablet 50 mg (50 mg Oral Given 08/23/21 1805)    ED Course  I have reviewed the triage vital signs and the nursing notes.  Pertinent labs & imaging results that were available during my care of the patient were reviewed by me and considered in my medical decision making (see chart for details).  Patient presented complaining of shortness of breath and chest pain, now resolved.  He has a history of hypertension, end-stage renal disease.  He has been seen in the hospital for similar presentations in the past.  I suspect these issues may be related to fluid change with dialysis.  I personally reviewed his chest x-ray today, she does show some very mild cardiomegaly but does not show any evident pulmonary edema, pneumonia, or pneumothorax.  His breath sounds are clear and he is not hypoxic.  I doubt pulmonary embolism.  He is also chest pain-free at this time.  His troponins are chronically elevated and his age of his end-stage renal disease, initial troponin is 228, repeat troponin is pending.  Blood pressure did become quite hypertensive but now 175/92.  Clinical Course as of 08/23/21 2041  Fri Aug 23, 2021   1750 His EKG per my interpretation shows a sinus rhythm without acute ischemic findings or significant change from prior tracing, no STEMI [MT]  1842 Delta troponin flat within limits of his ESRD - remains pain free, okay for discharge.  Will refer to cardiology given his episode of chest pain and risk factors [MT]    Clinical Course User Index [MT] Cordaro Mukai, Carola Rhine, MD    Final Clinical Impression(s) / ED Diagnoses Final diagnoses:  Shortness of breath  Chest pain, unspecified type    Rx / DC Orders ED Discharge Orders     None        Wyvonnia Dusky, MD 08/23/21 2042

## 2021-08-23 NOTE — Telephone Encounter (Signed)
ED RN CM call spouse to discuss possible [place] services, services were declined at this time.  Sent referral to T. Turner MD with [facility]. No further ED CM needs identified.

## 2021-08-24 IMAGING — CR DG CHEST 2V
2 series · 2 of 2 positions shown · non-contrast
Comparison: None.

CLINICAL DATA: Shortness of breath. Chronic renal disease on
dialysis.

EXAM:
CHEST - 2 VIEW

[w chest pa]
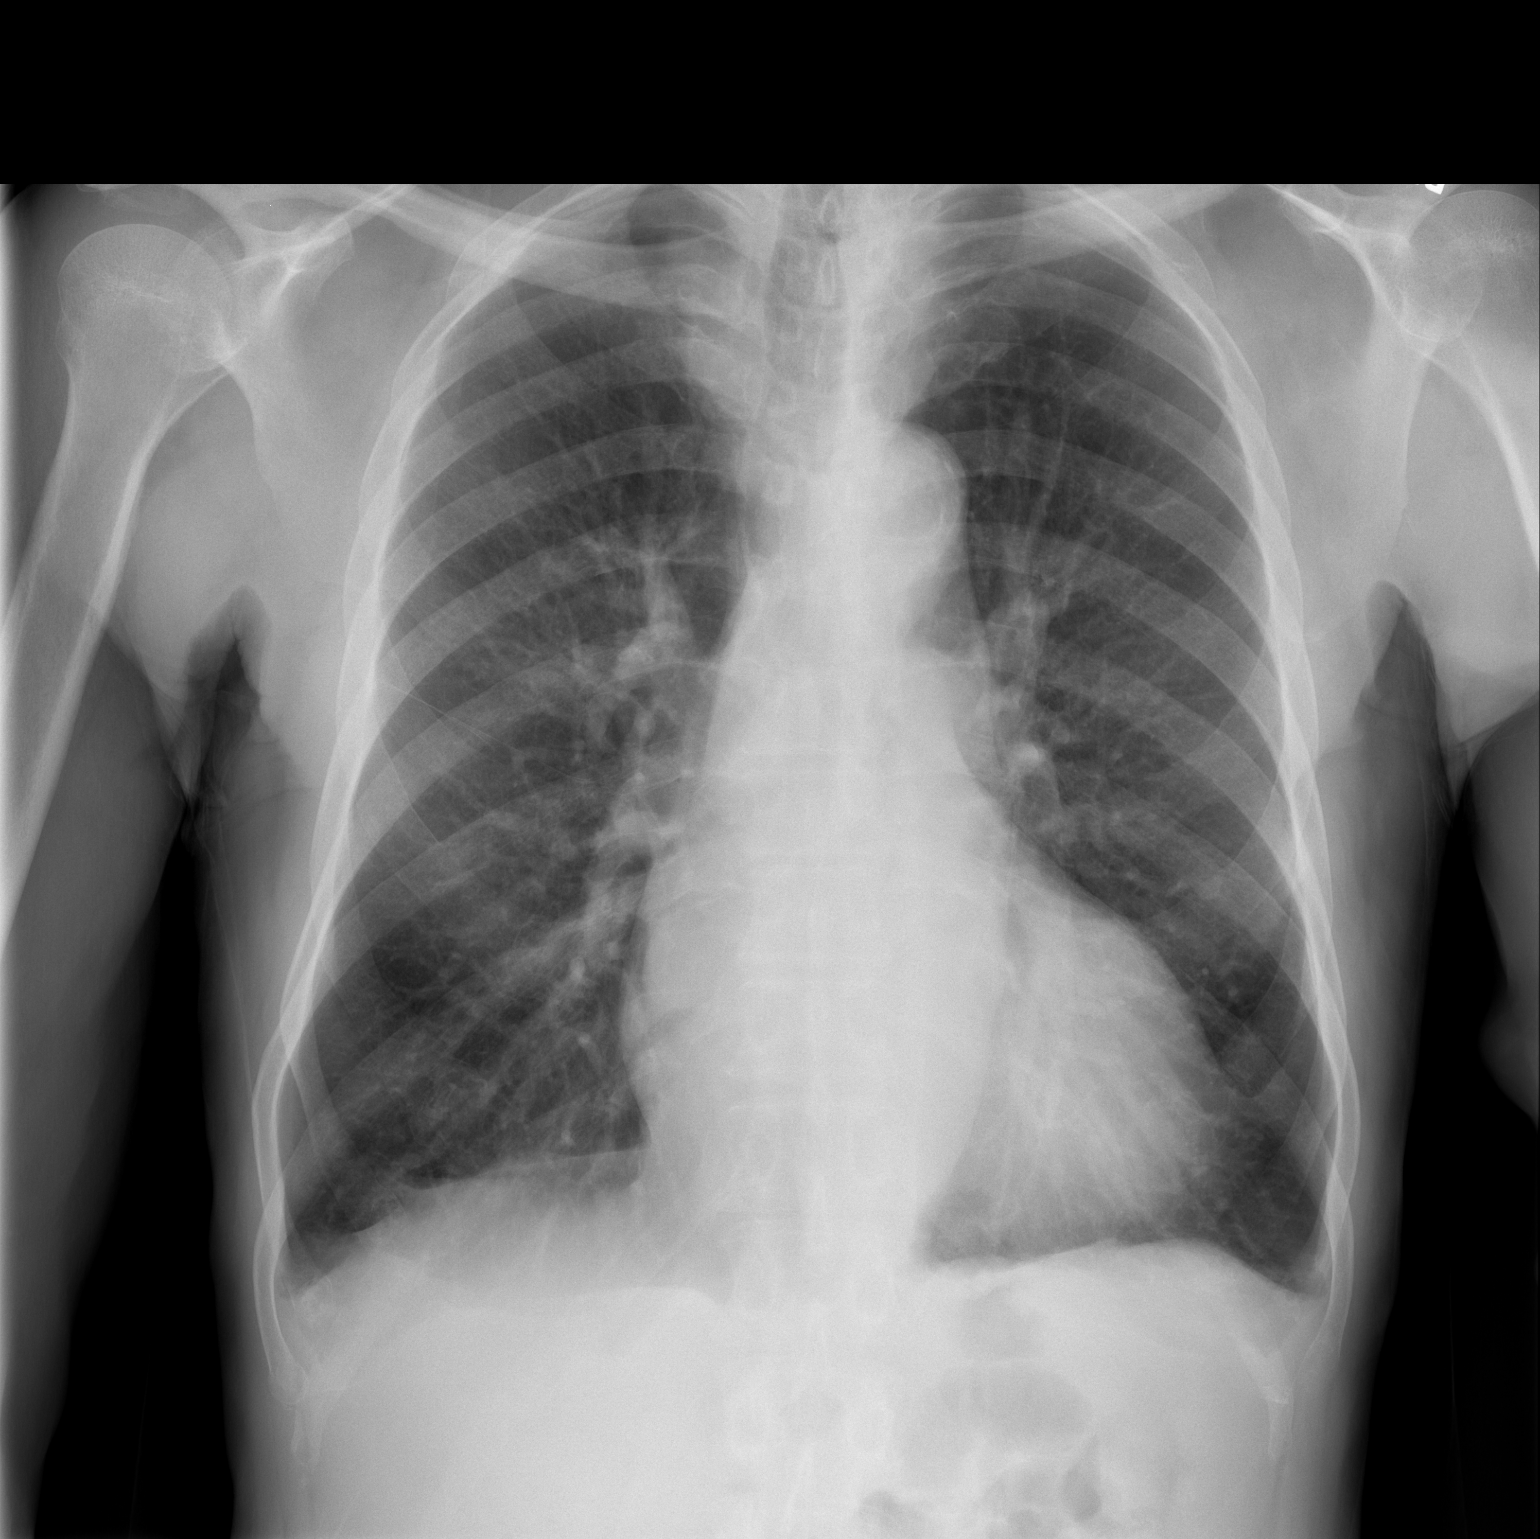

[w chest lat]
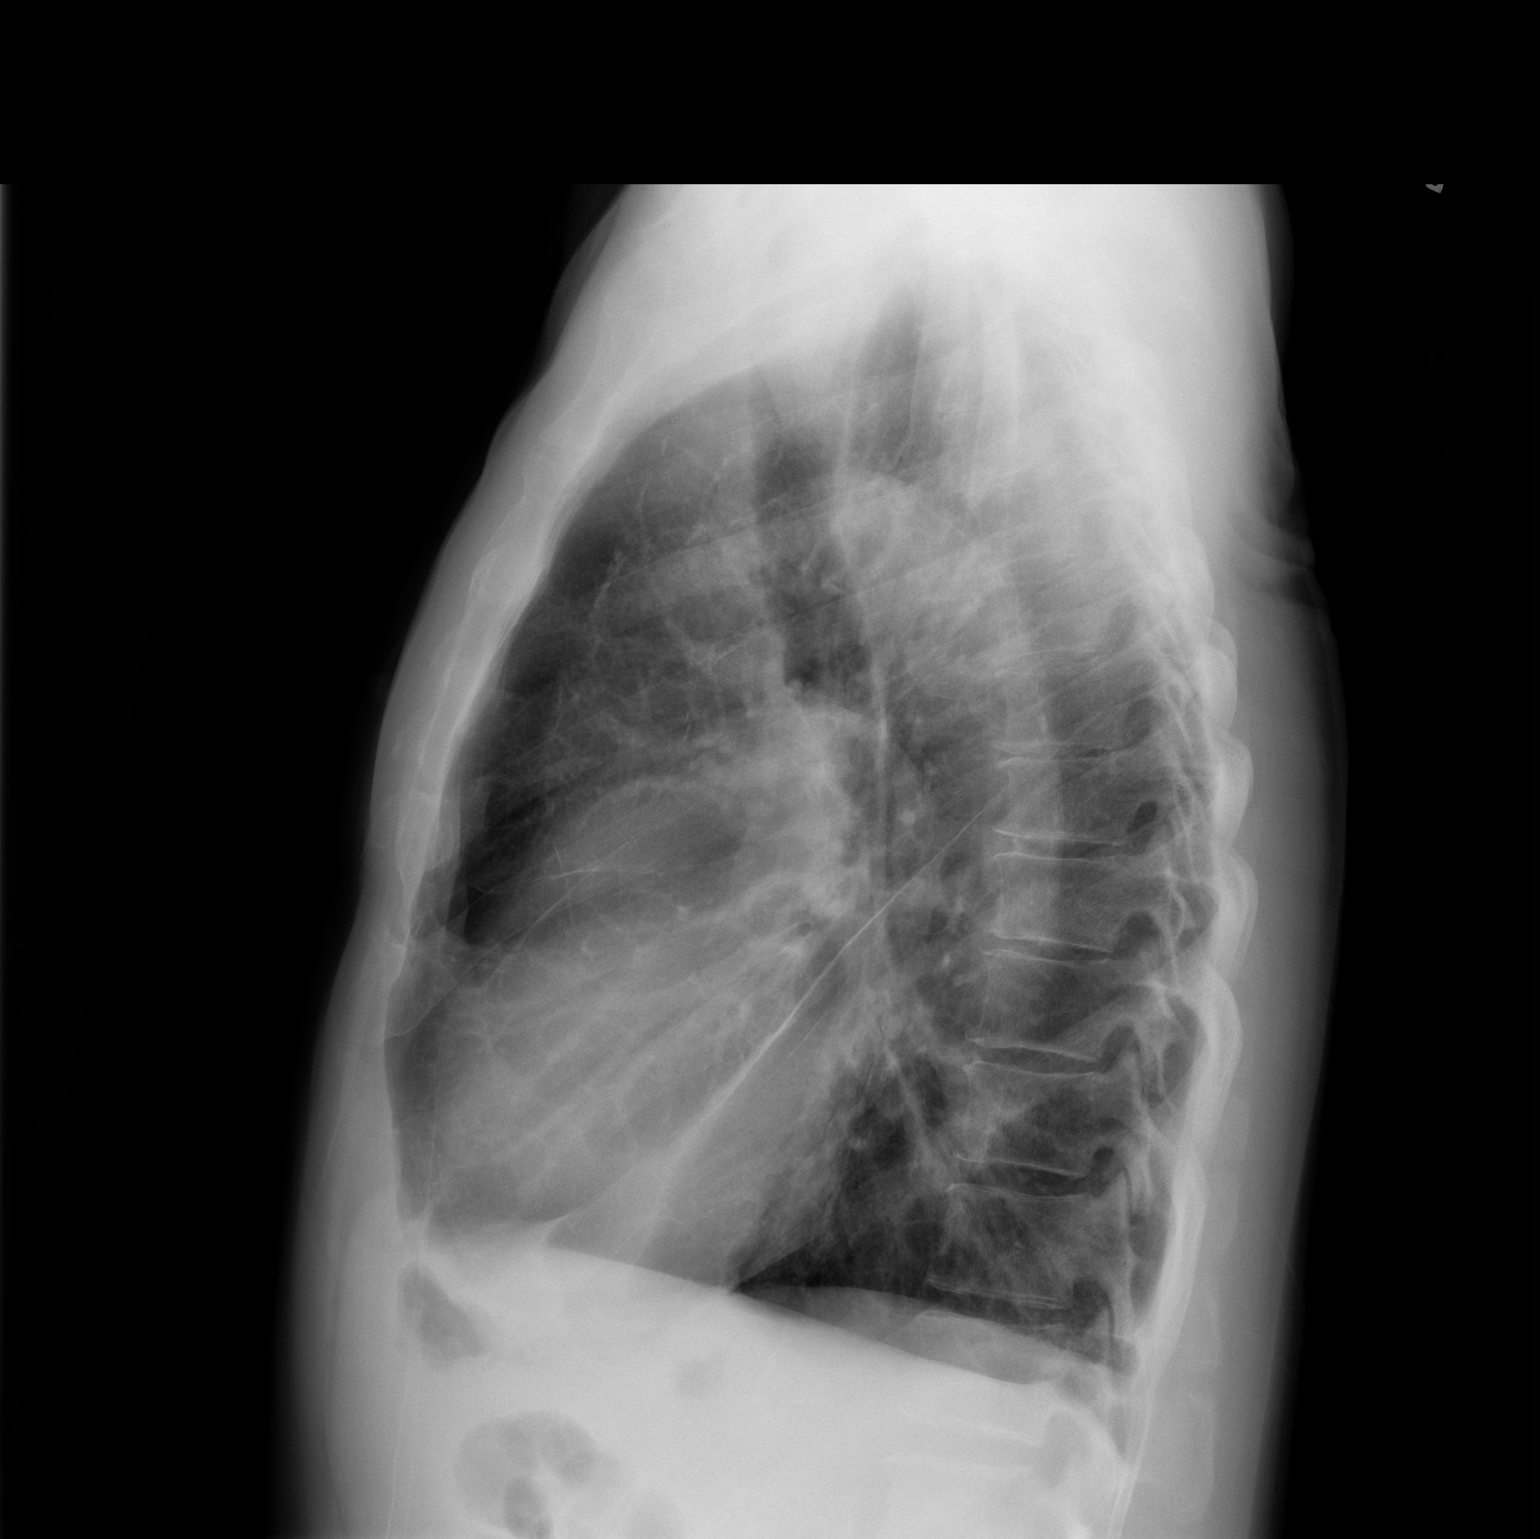

[2 of 2 positions shown; findings below may reference images not displayed]

FINDINGS: The heart is within normal limits in size. There is mild tortuosity
and calcification of the thoracic aorta. The pulmonary hila appear
normal.

The lungs demonstrate hyperinflation and probable emphysematous
changes. No pulmonary infiltrates, pulmonary edema or pleural
effusions. Bilateral nipple shadows are noted.

The bony thorax is intact.
IMPRESSION: Hyperinflation and probable emphysematous changes but no acute
pulmonary findings.

## 2021-09-09 ENCOUNTER — Other Ambulatory Visit: Payer: Self-pay

## 2021-09-09 ENCOUNTER — Emergency Department (HOSPITAL_COMMUNITY): Payer: Medicare Other

## 2021-09-09 ENCOUNTER — Emergency Department (HOSPITAL_COMMUNITY)
Admission: EM | Admit: 2021-09-09 | Discharge: 2021-09-09 | Disposition: A | Discharge status: Home / Self Care | Payer: Medicare Other | Attending: Emergency Medicine | Admitting: Emergency Medicine

## 2021-09-09 ENCOUNTER — Encounter (HOSPITAL_COMMUNITY): Payer: Self-pay | Admitting: *Deleted

## 2021-09-09 DIAGNOSIS — Z7982 Long term (current) use of aspirin: Secondary | ICD-10-CM | POA: Diagnosis not present

## 2021-09-09 DIAGNOSIS — I161 Hypertensive emergency: Secondary | ICD-10-CM | POA: Diagnosis not present

## 2021-09-09 DIAGNOSIS — R079 Chest pain, unspecified: Secondary | ICD-10-CM | POA: Diagnosis present

## 2021-09-09 DIAGNOSIS — I480 Paroxysmal atrial fibrillation: Secondary | ICD-10-CM | POA: Insufficient documentation

## 2021-09-09 DIAGNOSIS — I5032 Chronic diastolic (congestive) heart failure: Secondary | ICD-10-CM | POA: Diagnosis not present

## 2021-09-09 DIAGNOSIS — N186 End stage renal disease: Secondary | ICD-10-CM | POA: Diagnosis not present

## 2021-09-09 DIAGNOSIS — Z79899 Other long term (current) drug therapy: Secondary | ICD-10-CM | POA: Diagnosis not present

## 2021-09-09 DIAGNOSIS — I132 Hypertensive heart and chronic kidney disease with heart failure and with stage 5 chronic kidney disease, or end stage renal disease: Secondary | ICD-10-CM | POA: Insufficient documentation

## 2021-09-09 DIAGNOSIS — F0390 Unspecified dementia without behavioral disturbance: Secondary | ICD-10-CM | POA: Insufficient documentation

## 2021-09-09 DIAGNOSIS — Z7951 Long term (current) use of inhaled steroids: Secondary | ICD-10-CM | POA: Insufficient documentation

## 2021-09-09 DIAGNOSIS — J441 Chronic obstructive pulmonary disease with (acute) exacerbation: Secondary | ICD-10-CM | POA: Diagnosis not present

## 2021-09-09 DIAGNOSIS — Z992 Dependence on renal dialysis: Secondary | ICD-10-CM | POA: Diagnosis not present

## 2021-09-09 DIAGNOSIS — F17210 Nicotine dependence, cigarettes, uncomplicated: Secondary | ICD-10-CM | POA: Diagnosis not present

## 2021-09-09 DIAGNOSIS — R0602 Shortness of breath: Secondary | ICD-10-CM | POA: Diagnosis not present

## 2021-09-09 LAB — BASIC METABOLIC PANEL WITH GFR
Anion gap: 14 (ref 5–15)
BUN: 53 mg/dL — ABNORMAL HIGH (ref 8–23)
CO2: 26 mmol/L (ref 22–32)
Calcium: 8.9 mg/dL (ref 8.9–10.3)
Chloride: 99 mmol/L (ref 98–111)
Creatinine, Ser: 12.06 mg/dL — ABNORMAL HIGH (ref 0.61–1.24)
GFR, Estimated: 4 mL/min — ABNORMAL LOW
Glucose, Bld: 81 mg/dL (ref 70–99)
Potassium: 4 mmol/L (ref 3.5–5.1)
Sodium: 139 mmol/L (ref 135–145)

## 2021-09-09 LAB — TROPONIN I (HIGH SENSITIVITY)
Troponin I (High Sensitivity): 149 ng/L
Troponin I (High Sensitivity): 158 ng/L

## 2021-09-09 LAB — CBC WITH DIFFERENTIAL/PLATELET
Abs Immature Granulocytes: 0.04 10*3/uL (ref 0.00–0.07)
Basophils Absolute: 0.1 10*3/uL (ref 0.0–0.1)
Basophils Relative: 1 %
Eosinophils Absolute: 0.1 10*3/uL (ref 0.0–0.5)
Eosinophils Relative: 2 %
HCT: 35 % — ABNORMAL LOW (ref 39.0–52.0)
Hemoglobin: 10.4 g/dL — ABNORMAL LOW (ref 13.0–17.0)
Immature Granulocytes: 1 %
Lymphocytes Relative: 18 %
Lymphs Abs: 1.4 10*3/uL (ref 0.7–4.0)
MCH: 27.4 pg (ref 26.0–34.0)
MCHC: 29.7 g/dL — ABNORMAL LOW (ref 30.0–36.0)
MCV: 92.1 fL (ref 80.0–100.0)
Monocytes Absolute: 0.7 10*3/uL (ref 0.1–1.0)
Monocytes Relative: 9 %
Neutro Abs: 5.3 10*3/uL (ref 1.7–7.7)
Neutrophils Relative %: 69 %
Platelets: 218 10*3/uL (ref 150–400)
RBC: 3.8 MIL/uL — ABNORMAL LOW (ref 4.22–5.81)
RDW: 22.6 % — ABNORMAL HIGH (ref 11.5–15.5)
WBC: 7.6 10*3/uL (ref 4.0–10.5)
nRBC: 0 % (ref 0.0–0.2)

## 2021-09-09 MED ORDER — HYDRALAZINE HCL 25 MG PO TABS
50.0000 mg | ORAL_TABLET | Freq: Once | ORAL | Status: AC
  Administered 2021-09-09: 07:00:00 50 mg via ORAL
  Filled 2021-09-09: qty 2

## 2021-09-09 MED ORDER — CARVEDILOL 3.125 MG PO TABS
6.2500 mg | ORAL_TABLET | ORAL | Status: AC
  Administered 2021-09-09: 07:00:00 6.25 mg via ORAL
  Filled 2021-09-09: qty 2

## 2021-09-09 NOTE — ED Notes (Signed)
Pt placed on 2L nasal cannula for comfort, per pt's request. 99-100% on room air.

## 2021-09-09 NOTE — Discharge Instructions (Addendum)
Please contact both your primary care doctor and your cardiologist to discuss follow-up.  The cardiologist recommended increasing your Imdur to 60mg  daily (2 tablets) on non-dialysis days. On days you are going to receive dialysis, you should only take your regular 30mg  dose.    Come back to ER if you have any recurrent chest pain or difficulty in breathing, lightheadedness or episodes of passing out.

## 2021-09-09 NOTE — Progress Notes (Addendum)
Contacted by ED MD regarding out-pt HD for pt at d/c. Contacted [facility] and spoke to [place]. Clinic reports that pt receives out-pt HD on TTS. Pt arrives at 10:20 for 10:45 chair time. Pt can resume out-pt treatment tomorrow. ED MD made aware of this info.   Melven Sartorius Renal Navigator [phone]  Addendum at 11:38 am: Navigator was notified by clinic that pt is a MWF pt. Pt normally arrives at 11:45 for 12:10 chair time. Clinic is able to provide pt's treatment today if pt is able to arrive by 11:45. This info was provided to ED MD. Message left for Shawn, clinic manager, that pt was d/c from ED and is on his way to clinic for treatment.

## 2021-09-09 NOTE — ED Triage Notes (Signed)
Patient arrives via [facility], per their report, pt was asleep, woke up with CP, central, radiating left to right. Had this pain last week(after dialysis- MWF). Sinus arrhythmia, hr 65, 98%, 180/92. 324 ASA, 1 nitro en route to ED. Pain 8/10.

## 2021-09-09 NOTE — ED Triage Notes (Signed)
C/o shortness of breath and generalized chest pain. Last dialysis was Friday.

## 2021-09-09 NOTE — ED Provider Notes (Signed)
[facility] EMERGENCY DEPARTMENT Provider Note   CSN: [id number] Arrival date & time: 09/09/21  0107     History Chief Complaint  Patient presents with   Chest Pain    Anthony [name] is a 80 y.o. male.  Patient presents to the emergency department for evaluation of chest pain.  Patient complaining of shortness of breath in addition to the chest pain.  He was not hypoxic at arrival.  Patient has a history of end-stage renal disease, on dialysis.  He has a history of mild congestive heart failure.  Patient is due for dialysis later today.      Past Medical History:  Diagnosis Date   Alcohol abuse 2021   Arthritis    CHF (congestive heart failure) (HCC)    Chicken pox    Chronic kidney disease    M W F   Dementia ([place])    mild   Dyspnea    ESRD (end stage renal disease) on dialysis [facility])    GI bleed    History of blood transfusion 03/2021   Hypertension    Insomnia    Mitral regurgitation 05/16/2021   Positive QuantiFERON-TB Gold test 05/16/2021   Stroke ([place])    TB lung, latent 05/16/2021    Patient Active Problem List   Diagnosis Date Noted   Positive QuantiFERON-TB Gold test 05/16/2021   TB lung, latent 05/16/2021   Mitral regurgitation 05/16/2021   Malnutrition of moderate degree 04/18/2021   Anemia 04/16/2021   Symptomatic anemia 04/16/2021   COPD (chronic obstructive pulmonary disease) ([place]) 04/16/2021   CAD (coronary artery disease) 04/16/2021   HLD (hyperlipidemia) 04/16/2021   Prolonged QT interval 04/16/2021   Abnormal ECG 04/16/2021   Dementia without behavioral disturbance ([place]) 04/16/2021   COPD with acute exacerbation ([place]) 04/16/2021   Occult blood positive stool 04/16/2021   COPD exacerbation ([place]) 03/01/2020   Tobacco use disorder 03/01/2020   Chronic diastolic heart failure (HCC)    PAF (paroxysmal atrial fibrillation) ([place])    Anemia of chronic disease 02/29/2020   Hypertensive urgency 02/29/2020   Elevated troponin  02/29/2020   Chest pain 02/29/2020   ESRD (end stage renal disease) on dialysis ([place]) 12/15/2019   HTN (hypertension) 12/15/2019   Insomnia     Past Surgical History:  Procedure Laterality Date   AV FISTULA PLACEMENT Right 05/14/2021   Procedure: INSERTION OF ARTERIOVENOUS (AV) GORE-TEX GRAFT ARM;  Surgeon: Angelia Mould, MD;  Location: [facility] OR;  Service: Vascular;  Laterality: Right;   ENTEROSCOPY N/A 04/18/2021   Procedure: ENTEROSCOPY;  Surgeon: Jackquline Denmark, MD;  Location: [facility] ENDOSCOPY;  Service: Endoscopy;  Laterality: N/A;   HOT HEMOSTASIS N/A 04/18/2021   Procedure: HOT HEMOSTASIS (ARGON PLASMA COAGULATION/BICAP);  Surgeon: Jackquline Denmark, MD;  Location: [facility] ENDOSCOPY;  Service: Endoscopy;  Laterality: N/A;   SUBMUCOSAL TATTOO INJECTION  04/18/2021   Procedure: SUBMUCOSAL TATTOO INJECTION;  Surgeon: Jackquline Denmark, MD;  Location: [facility] ENDOSCOPY;  Service: Endoscopy;;       Family History  Problem Relation Age of Onset   Hypertension Mother    Hypertension Father     Social History   Tobacco Use   Smoking status: Every Day    Packs/day: 0.25    Years: 61.00    Pack years: 15.25    Types: Cigarettes   Smokeless tobacco: Never  Vaping Use   Vaping Use: Never used  Substance Use Topics   Alcohol use: Not Currently    Comment: quit 2021  Drug use: Not Currently    Home Medications Prior to Admission medications   Medication Sig Start Date End Date Taking? Authorizing Provider  ADVAIR HFA [phone] MCG/ACT inhaler INHALE 2 PUFFS INTO THE LUNGS IN THE MORNING AND AT BEDTIME Patient not taking: Reported on 08/17/2021 01/17/21   Isaac Bliss, Rayford Halsted, MD  albuterol (PROVENTIL HFA) 108 (90 Base) MCG/ACT inhaler Inhale 2 puffs into the lungs every 4 (four) hours as needed for wheezing or shortness of breath. 03/08/20   Laurey Morale, MD  ASPIRIN LOW DOSE 81 MG chewable tablet Chew 81 mg by mouth daily. 03/05/21   [provider]  atorvastatin (LIPITOR) 20 MG  tablet TAKE 1 TABLET BY MOUTH EVERYDAY AT BEDTIME Patient not taking: Reported on 08/17/2021 07/30/20   Isaac Bliss, Rayford Halsted, MD  budesonide-formoterol [facility]) 160-4.5 MCG/ACT inhaler Inhale 2 puffs into the lungs daily. 04/12/21   [provider]  calcium acetate (PHOSLO) 667 MG capsule Take 667 mg by mouth 3 (three) times daily with meals.    [provider]  carboxymethylcellulose (REFRESH PLUS) 0.5 % SOLN Place 1 drop into both eyes daily as needed (dry eyes). 12/07/20   [provider]  carvedilol (COREG) 6.25 MG tablet Take 1 tablet (6.25 mg total) by mouth 2 (two) times daily with a meal. 03/02/20   Black, Lezlie Octave, NP  hydrALAZINE (APRESOLINE) 50 MG tablet Take 50 mg by mouth every 8 (eight) hours.    [provider]  isoniazid (NYDRAZID) 300 MG tablet TAKE 1 TABLET BY MOUTH EVERY DAY Patient taking differently: Take 300 mg by mouth daily. 07/26/21   Truman Hayward, MD  isosorbide mononitrate (IMDUR) 30 MG 24 hr tablet Take 30 mg by mouth daily. 04/02/21   [provider]  lidocaine-prilocaine (EMLA) cream Apply 1 application topically daily as needed (port access).    [provider]  memantine (NAMENDA) 5 MG tablet Take 5 mg by mouth daily. 03/12/21   [provider]  Methoxy PEG-Epoetin Beta (MIRCERA IJ) Mircera 05/13/21 05/12/22  [provider]  nicotine (NICODERM CQ - DOSED IN MG/24 HOURS) 14 mg/24hr patch Place 1 patch (14 mg total) onto the skin daily. Patient not taking: Reported on 08/17/2021 03/15/20   Isaac Bliss, Rayford Halsted, MD  oxyCODONE-acetaminophen (PERCOCET) 10-325 MG tablet Take 1 tablet by mouth every 6 (six) hours as needed for pain. Patient not taking: Reported on 08/17/2021 05/14/21 05/14/22  Vaughan Basta, Edman Circle, PA-C  pantoprazole (PROTONIX) 40 MG tablet Take 1 tablet (40 mg total) by mouth daily. 04/24/21   Mariel Aloe, MD  pyridOXINE (VITAMIN B-6) 50 MG tablet Take 1 tablet (50 mg total)  by mouth daily. 05/16/21 02/10/22  Truman Hayward, MD  Tiotropium Bromide Monohydrate (SPIRIVA RESPIMAT) 2.5 MCG/ACT AERS Inhale 2 puffs into the lungs daily. 03/15/20   Isaac Bliss, Rayford Halsted, MD  traZODone (DESYREL) 50 MG tablet Take 1-2 tablets (50-100 mg total) by mouth at bedtime as needed. Patient taking differently: Take 50-100 mg by mouth at bedtime as needed for sleep. 06/12/21   Isaac Bliss, Rayford Halsted, MD    Allergies    Patient has no known allergies.  Review of Systems   Review of Systems  Respiratory:  Positive for shortness of breath.   Cardiovascular:  Positive for chest pain.  All other systems reviewed and are negative.  Physical Exam Updated Vital Signs BP (!) 187/94    Pulse 60    Temp 98 F (  36.7 C)    Resp 14    SpO2 99%   Physical Exam Vitals and nursing note reviewed.  Constitutional:      General: He is not in acute distress.    Appearance: Normal appearance. He is well-developed.  HENT:     Head: Normocephalic and atraumatic.     Right Ear: Hearing normal.     Left Ear: Hearing normal.     Nose: Nose normal.  Eyes:     Conjunctiva/sclera: Conjunctivae normal.     Pupils: Pupils are equal, round, and reactive to light.  Cardiovascular:     Rate and Rhythm: Regular rhythm.     Heart sounds: S1 normal and S2 normal. No murmur heard.   No friction rub. No gallop.  Pulmonary:     Effort: Pulmonary effort is normal. No respiratory distress.     Breath sounds: Normal breath sounds.  Chest:     Chest wall: No tenderness.  Abdominal:     General: Bowel sounds are normal.     Palpations: Abdomen is soft.     Tenderness: There is no abdominal tenderness. There is no guarding or rebound. Negative signs include Murphy's sign and McBurney's sign.     Hernia: No hernia is present.  Musculoskeletal:        General: Normal range of motion.     Cervical back: Normal range of motion and neck supple.  Skin:    General: Skin is warm and dry.      Findings: No rash.  Neurological:     Mental Status: He is alert and oriented to person, place, and time.     GCS: GCS eye subscore is 4. GCS verbal subscore is 5. GCS motor subscore is 6.     Cranial Nerves: No cranial nerve deficit.     Sensory: No sensory deficit.     Coordination: Coordination normal.  Psychiatric:        Speech: Speech normal.        Behavior: Behavior normal.        Thought Content: Thought content normal.    ED Results / Procedures / Treatments   Labs (all labs ordered are listed, but only abnormal results are displayed) Labs Reviewed  CBC WITH DIFFERENTIAL/PLATELET - Abnormal; Notable for the following components:      Result Value   RBC 3.80 (*)    Hemoglobin 10.4 (*)    HCT 35.0 (*)    MCHC 29.7 (*)    RDW 22.6 (*)    All other components within normal limits  BASIC METABOLIC PANEL - Abnormal; Notable for the following components:   BUN 53 (*)    Creatinine, Ser 12.06 (*)    GFR, Estimated 4 (*)    All other components within normal limits  TROPONIN I (HIGH SENSITIVITY) - Abnormal; Notable for the following components:   Troponin I (High Sensitivity) 149 (*)    All other components within normal limits  TROPONIN I (HIGH SENSITIVITY) - Abnormal; Notable for the following components:   Troponin I (High Sensitivity) 158 (*)    All other components within normal limits    EKG None  Radiology DG Chest 2 View  Result Date: 09/09/2021 CLINICAL DATA:  Chest pain EXAM: CHEST - 2 VIEW COMPARISON:  08/23/2021 FINDINGS: Right CVC in unchanged position. Cardiac and mediastinal contours are within normal limits. Aortic atherosclerosis. No focal pulmonary opacity. No pleural effusion or pneumothorax. No acute osseous abnormality. IMPRESSION: No acute cardiopulmonary process. Electronically  Signed   By: Merilyn Baba M.D.   On: 09/09/2021 01:36    Procedures Procedures   Medications Ordered in ED Medications  hydrALAZINE (APRESOLINE) tablet 50 mg (has no  administration in time range)  carvedilol (COREG) tablet 6.25 mg (has no administration in time range)    ED Course  I have reviewed the triage vital signs and the nursing notes.  Pertinent labs & imaging results that were available during my care of the patient were reviewed by me and considered in my medical decision making (see chart for details).    MDM Rules/Calculators/A&P                            Patient appears well at arrival.  Reviewing his records reveals he does have a history of some dementia.  He is a poor historian.  He comes to the emergency department with complaints of chest pain.  Patient also complaining of sensation of shortness of breath.  He is not hypoxic or in respiratory distress.  Lungs are clear on x-ray and by auscultation.  EKG is unchanged from prior.  Troponins are elevated but appear to be chronically elevated based on his most recent hospital visits.  Patient has become pain-free here in the department without intervention.  He is now sleeping comfortably, awakens easily and states that he has no pain.  Discussed with cardiology.  They will see the patient this morning and further evaluate.  Final Clinical Impression(s) / ED Diagnoses Final diagnoses:  Chest pain, unspecified type    Rx / DC Orders ED Discharge Orders     None        Trayshawn Durkin, Gwenyth Allegra, MD 09/09/21 0700

## 2021-09-09 NOTE — ED Provider Notes (Signed)
Emergency Medicine Provider Triage Evaluation Note  Anthony [name] , a 80 y.o. male  was evaluated in triage.  Pt complains of chest pain that woke him from sleep-- feels like a line going all the way across his chest.  No cough, fever, SOB.  Due for dialysis in AM, no missed sessions.  Schedule MWF.  Newly referred to cardiology but has not seen them yet.  Review of Systems  Positive: Chest pain Negative: Fever, chills, SOB  Physical Exam  BP (!) 187/87    Pulse 66    Temp 98 F (36.7 C)    Resp (!) 23    SpO2 94%  Gen:   Awake, no distress   Resp:  Normal effort  MSK:   Moves extremities without difficulty  Other:  Dialysis catheter right chest, clean without signs of infection  Medical Decision Making  Medically screening exam initiated at 1:15 AM.  Appropriate orders placed.  Anthony [name] was informed that the remainder of the evaluation will be completed by another provider, this initial triage assessment does not replace that evaluation, and the importance of remaining in the ED until their evaluation is complete.  Chest pain.  ESRD on HD, no missed sessions.  VS overall stable, somewhat hypertensive.  EKG, labs, CXR.   Larene Pickett, PA-C 09/09/21 0118    Orpah Greek, MD 09/09/21 [phone]

## 2021-09-09 NOTE — ED Provider Notes (Addendum)
Sign out note  80 year old gentleman presented to ER with concern for chest pain.  He was hypertensive, EKG was unchanged and troponin was mildly elevated, repeat troponin was stable.  Cardiology was consulted.  They evaluated patient this morning, he has no ongoing pain and his blood pressure improved.  They feel patient can be discharged or admitted for observation.  I reassessed patient this morning.  His blood pressure has normalized.  He has no chest pain, discussed options of discharge versus observation admission.  Patient states he would like to go home if possible.  Stressed need for close follow-up with cardiology in the outpatient setting.  They will arrange follow-up.  Discussed with renal navigator, she contacted patient's clinic and confirmed that he is a Tuesday, Thursday, Saturday dialysis patient.    ADDENDUM - Melven Sartorius said patient is in fact MWF, will need to get to [facility] by 1145 if not, needs reschedule for tomorrow; I discussed with CM and they believe can get transport for pt promptly.   Lucrezia Starch, MD 09/09/21 4034    Lucrezia Starch, MD 09/09/21 7425    Lucrezia Starch, MD 09/09/21 1021

## 2021-09-09 NOTE — Consult Note (Signed)
Cardiology Consultation:   Patient ID: Anthony [name] MRN: [id number]; DOB: 15-Nov-1940  Admit date: 09/09/2021 Date of Consult: 09/09/2021  PCP:  Anthony [name], Anthony Halsted, MD   [facility] Providers Cardiologist:  Anthony Him, MD        Patient Profile:   Anthony [name] is a 80 y.o. male with a hx of PAF not on anticoagulation due to h/o polysubstance abuse, severe anemia and GI bleed, poorly controlled HTN, HLD, nonobstructive CAD and ESRD on HD MWF who is being seen 09/09/2021 for the evaluation of chest pain with borderline elevated troponin at the request of Dr. Betsey [name].  History of Present Illness:   Anthony [name] is a 80 year old male with past medical history of PAF not on anticoagulation due to h/o polysubstance abuse, severe anemia and GI bleed, poorly controlled HTN, HLD, nonobstructive CAD and ESRD on HD MWF.  Myoview in 2019 was negative for ischemia.  He has a history of polysubstance abuse with EtOH, heroin and tobacco.  He was last seen by Anthony [name] on 03/20/2020.  Despite the fact he has moved to [facility] for 2 years, he frequently goes back to [place].  Dobutamine stress echo obtained on 12/28/2020 at [facility] showed evidence of septal wall motion abnormality.  As part of his cardiac risk stratification prior to dialysis access surgery, his cardiologist at [place] recommended cardiac catheterization in May 2022.  Patient was admitted [facility] was chest pain on 03/02/2021, troponin peaked at 0.15 which was similar to the previous admissions.  Cardiology service was consulted and the patient was taken to the Cath Lab on 03/04/2021 for definitive evaluation.  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  His chest pain was felt to be related to  hypertensive urgency.  In the past 3 weeks, patient has been seen in the emergency room 3 different times.  He was evaluated for weakness and altered mental status on 11/26 and a chest pain/shortness of breath on 12/2.  Talking with the patient, his blood pressure has been high at home.  Since the last episode of chest pain on 12/2, he has not had any further chest pain until last night.  He has been able to walk around in the neighborhood without any exertional chest pain.  He does have chronic dyspnea on exertion.  He was in his usual state of health until around 11 PM last night when he woke up with a chest pain radiating across his chest from the right side to the left side.  He subsequently sought medical attention at [facility], ED.  Serial troponin was flat at 149--> 158.  Creatinine 12.06.  He did undergo full course of dialysis last Friday and has been compliant with his blood pressure medication at home including carvedilol, hydralazine and Imdur.  Hemoglobin is 10.4.  Blood pressure on arrival was around 180-200 range.  Chest pain went away around 1 AM this morning and lasted a total of 2 hours before going away.  He has not had any recurrence of chest pain since.  Chest x-ray showed no acute cardio or pulmonary process.  EKG showed sinus rhythm with lateral T wave inversion which is unchanged when compared to the previous EKG.  Cardiology service has been consulted for somewhat atypical chest pain in the setting of  hypertensive urgency.   Past Medical History:  Diagnosis Date   Alcohol abuse 2021   Arthritis    CHF (congestive heart failure) ([place])    Chicken pox    Chronic kidney disease    M W F   Dementia ([place])    mild   Dyspnea    ESRD (end stage renal disease) on dialysis [facility])    GI bleed    History of blood transfusion 03/2021   Hypertension    Insomnia    Mitral regurgitation 05/16/2021   Positive QuantiFERON-TB Gold test 05/16/2021   Stroke ([place])    TB lung, latent 05/16/2021     Past Surgical History:  Procedure Laterality Date   AV FISTULA PLACEMENT Right 05/14/2021   Procedure: INSERTION OF ARTERIOVENOUS (AV) GORE-TEX GRAFT ARM;  Surgeon: Anthony Mould, MD;  Location: [facility] OR;  Service: Vascular;  Laterality: Right;   ENTEROSCOPY N/A 04/18/2021   Procedure: ENTEROSCOPY;  Surgeon: Anthony Denmark, MD;  Location: [place];  Service: Endoscopy;  Laterality: N/A;   HOT HEMOSTASIS N/A 04/18/2021   Procedure: HOT HEMOSTASIS (ARGON PLASMA COAGULATION/BICAP);  Surgeon: Anthony Denmark, MD;  Location: [facility] ENDOSCOPY;  Service: Endoscopy;  Laterality: N/A;   SUBMUCOSAL TATTOO INJECTION  04/18/2021   Procedure: SUBMUCOSAL TATTOO INJECTION;  Surgeon: Anthony Denmark, MD;  Location: [facility] ENDOSCOPY;  Service: Endoscopy;;     Home Medications:  Prior to Admission medications   Medication Sig Start Date End Date Taking? Authorizing Provider  ADVAIR HFA [phone] MCG/ACT inhaler INHALE 2 PUFFS INTO THE LUNGS IN THE MORNING AND AT BEDTIME Patient not taking: Reported on 08/17/2021 01/17/21   Anthony [name], Anthony Halsted, MD  albuterol (PROVENTIL HFA) 108 (90 Base) MCG/ACT inhaler Inhale 2 puffs into the lungs every 4 (four) hours as needed for wheezing or shortness of breath. 03/08/20   Laurey Morale, MD  ASPIRIN LOW DOSE 81 MG chewable tablet Chew 81 mg by mouth daily. 03/05/21   [provider]  atorvastatin (LIPITOR) 20 MG tablet TAKE 1 TABLET BY MOUTH EVERYDAY AT BEDTIME Patient not taking: Reported on 08/17/2021 07/30/20   Anthony [name], Anthony Halsted, MD  budesonide-formoterol [facility]) 160-4.5 MCG/ACT inhaler Inhale 2 puffs into the lungs daily. 04/12/21   [provider]  calcium acetate (PHOSLO) 667 MG capsule Take 667 mg by mouth 3 (three) times daily with meals.    [provider]  carboxymethylcellulose (REFRESH PLUS) 0.5 % SOLN Place 1 drop into both eyes daily as needed (dry eyes). 12/07/20   [provider]  carvedilol (COREG) 6.25 MG  tablet Take 1 tablet (6.25 mg total) by mouth 2 (two) times daily with a meal. 03/02/20   Black, Lezlie Octave, NP  hydrALAZINE (APRESOLINE) 50 MG tablet Take 50 mg by mouth every 8 (eight) hours.    [provider]  isoniazid (NYDRAZID) 300 MG tablet TAKE 1 TABLET BY MOUTH EVERY DAY Patient taking differently: Take 300 mg by mouth daily. 07/26/21   Truman Hayward, MD  isosorbide mononitrate (IMDUR) 30 MG 24 hr tablet Take 30 mg by mouth daily. 04/02/21   [provider]  lidocaine-prilocaine (EMLA) cream Apply 1 application topically daily as needed (port access).    [provider]  memantine (NAMENDA) 5 MG tablet Take 5 mg by mouth daily. 03/12/21   [provider]  Methoxy PEG-Epoetin Beta (MIRCERA IJ) Mircera 05/13/21 05/12/22  [provider]  nicotine (NICODERM CQ - DOSED IN MG/24 HOURS) 14 mg/24hr patch Place 1 patch (  14 mg total) onto the skin daily. Patient not taking: Reported on 08/17/2021 03/15/20   Anthony [name], Anthony Halsted, MD  oxyCODONE-acetaminophen (PERCOCET) 10-325 MG tablet Take 1 tablet by mouth every 6 (six) hours as needed for pain. Patient not taking: Reported on 08/17/2021 05/14/21 05/14/22  Vaughan Basta, Edman Circle, PA-C  pantoprazole (PROTONIX) 40 MG tablet Take 1 tablet (40 mg total) by mouth daily. 04/24/21   Mariel Aloe, MD  pyridOXINE (VITAMIN B-6) 50 MG tablet Take 1 tablet (50 mg total) by mouth daily. 05/16/21 02/10/22  Truman Hayward, MD  Tiotropium Bromide Monohydrate (SPIRIVA RESPIMAT) 2.5 MCG/ACT AERS Inhale 2 puffs into the lungs daily. 03/15/20   Anthony [name], Anthony Halsted, MD  traZODone (DESYREL) 50 MG tablet Take 1-2 tablets (50-100 mg total) by mouth at bedtime as needed. Patient taking differently: Take 50-100 mg by mouth at bedtime as needed for sleep. 06/12/21   Anthony [name], Anthony Halsted, MD    Inpatient Medications: Scheduled Meds:  Continuous Infusions:  PRN Meds:   Allergies:   No Known Allergies  Social  History:   Social History   Socioeconomic History   Marital status: Married    Spouse name: Not on file   Number of children: Not on file   Years of education: Not on file   Highest education level: Not on file  Occupational History   Not on file  Tobacco Use   Smoking status: Every Day    Packs/day: 0.25    Years: 61.00    Pack years: 15.25    Types: Cigarettes   Smokeless tobacco: Never  Vaping Use   Vaping Use: Never used  Substance and Sexual Activity   Alcohol use: Not Currently    Comment: quit 2021   Drug use: Not Currently   Sexual activity: Not Currently  Other Topics Concern   Not on file  Social History Narrative   Not on file   Social Determinants of Health   Financial Resource Strain: Not on file  Food Insecurity: Not on file  Transportation Needs: Not on file  Physical Activity: Not on file  Stress: Not on file  Social Connections: Not on file  Intimate Partner Violence: Not on file    Family History:    Family History  Problem Relation Age of Onset   Hypertension Mother    Hypertension Father      ROS:  Please see the history of present illness.   All other ROS reviewed and negative.     Physical Exam/Data:   Vitals:   09/09/21 0615 09/09/21 0645 09/09/21 0700 09/09/21 0715  BP: (!) 177/87 (!) 187/94 (!) 178/92 138/70  Pulse: 62 60 (!) 59 66  Resp: 16 14 15 15   Temp:      SpO2: 96% 99% 100% 100%   No intake or output data in the 24 hours ending 09/09/21 0803 Last 3 Weights 08/17/2021 08/08/2021 08/01/2021  Weight (lbs) 150 lb 150 lb 150 lb  Weight (kg) 68.04 kg 68.04 kg 68.04 kg     There is no height or weight on file to calculate BMI.  General:  Well nourished, well developed, in no acute distress HEENT: normal Neck: no JVD Vascular: No carotid bruits; Distal pulses 2+ bilaterally Cardiac:  normal S1, S2; RRR; no murmur  Lungs:  clear to auscultation bilaterally, no wheezing, rhonchi or rales  Abd: soft, nontender, no  hepatomegaly  Ext: no edema Musculoskeletal:  No deformities, BUE and BLE strength normal and  equal Skin: warm and dry  Neuro:  CNs 2-12 intact, no focal abnormalities noted Psych:  Normal affect   EKG:  The EKG was personally reviewed and demonstrates:  NSR with TWI in the lateral leads Telemetry:  Telemetry was personally reviewed and demonstrates:  NSR without significant ventricular ectopy  Relevant CV Studies:  Cardiac cath 03/04/2021 INTERVENTIONAL CARDIOLOGIST: Wallace Keller, MD INTERVENTIONAL FELLOW: Justice Rocher DO DATE OF PROCEDURE: 03/04/21  PROCEDURES: 1. Ultrasound guided right common femoral access  2. Selective Left and right coronary system angiography 3. DFR of the LAD was 0.90.  4. Conscious sedation monitoring time: 30 minutes  INDICATIONS:  Substernal chest pain without radiation in the setting of hypertension to 206/100. Relieved with nitro. Likely in the setting of hypertensive urgency. Troponin leak has been chronic likely from ESRD. However, has had a recent positive dobutamine stress echo pending cardiac catheterization outpatient.   CONSENT: After the risk and benefits were explained to the patient, he was able to reiterate the risk and benefits along with the details of the procedure and consented to the procedure.   CATHETERIZATION LABORATORY STATISTICS: 1. Total contrast used for this case was 100 cc of Visipaque contrast. 2. Total fluoroscopic time for this case was 6 minutes. 3. Total dose area product was 1660 cGycm2 4. Blood Loss: Less than 79mL  DESCRIPTION OF PROCEDURE: The patient was brought to the Cardiac Catheterization Laboratory in stable fasting condition. After informed consent was obtained and a WHO time out was performed with the entire cath lab team present before commencing the procedure. Local anesthesia administered with 2% Lidocaine solution subcutaneously. The patient was provided with conscious sedation with a total  of 1mg  intravenous Versed, 4mcg of intravenous Fentanyl. Anti-spasm cocktail of 2.5mg  Verapamil, 285mcg Nitroglycerin, 10,000 Units of Heparin administered intra-arterially.  ACCESS: Arterial: 6 Fr Sheath 25cm inserted into the right common femoral artery using a micropuncture needle under direct ultrasound guidance.   CATHETERS:  Coronary angiography:  Left coronary artery: 6 Fr JL 4.0 catheter Right coronary artery: 6 Fr JR 4.0 catheter  LV cavity entered and pressures recorded: Yes []  / No [x]    GUIDEWIRES:  GlideWire Angled .035 x 150c  HEMODYNAMIC DATA: 1. Aortic blood pressure is 97/60 mmHg with a mean of 74 mmHg.  ANGIOGRAPHIC DATA:  1. Left main coronary artery: Large caliber long vessel. Angiographically no significant occlusive disease noted.   2. Left anterior descending coronary artery (LAD): Large caliber vessel reaches the apex. Ostial 50-60% concentric stenosis. DFR of the distal LAD was not significant flow limiting at 0.90.   3. Left circumflex coronary artery (LCx): Large caliber vessel. Angiographically no significant occlusive disease noted.   4. Right coronary artery (RCA): Large caliber dominant vessel. RCA has a 60% proximal stenosis with post stenotic aneurysm. RPDA is a large caliber vessel with a proximal 50% stenosis.   Diastolic Hyperemia Free Ratio (DFR) Guide catheter: 54F EBU3.5 Target vessel: LAD Equalized: LM DFR: 0.90 Comet smartwire Pullback showed no significant drift  HEMOSTASIS: manual compression COMPLICATIONS: None  SPECIMENS REMOVED: None ESTIMATED BLOOD LOSS: Less than 5 mL  IMPRESSION: 1. Ostial LAD has a lesions with a DFR of 0.90 suggestive that is not significant flow limiting.  2. Proximal PDA 50% lesion.   RECOMMENDATIONS: 1. Continue aggressive risk factor modifications.  The results of the procedure were discussed with the patient and the patients family after the procedure. The results were also conveyed to the  referring primary. Wallace Keller, MD was  present during entired procedure and supervised the entire procedure.  Samuel Bouche DO Intervention Cardiology Fellow   Laboratory Data:  High Sensitivity Troponin:   Recent Labs  Lab 08/17/21 2045 08/23/21 1419 08/23/21 1724 09/09/21 0118 09/09/21 0345  TROPONINIHS 113* 228* 248* 149* 158*     Chemistry Recent Labs  Lab 09/09/21 0118  NA 139  K 4.0  CL 99  CO2 26  GLUCOSE 81  BUN 53*  CREATININE 12.06*  CALCIUM 8.9  GFRNONAA 4*  ANIONGAP 14    No results for input(s): PROT, ALBUMIN, AST, ALT, ALKPHOS, BILITOT in the last 168 hours. Lipids No results for input(s): CHOL, TRIG, HDL, LABVLDL, LDLCALC, CHOLHDL in the last 168 hours.  Hematology Recent Labs  Lab 09/09/21 0118  WBC 7.6  RBC 3.80*  HGB 10.4*  HCT 35.0*  MCV 92.1  MCH 27.4  MCHC 29.7*  RDW 22.6*  PLT 218   Thyroid No results for input(s): TSH, FREET4 in the last 168 hours.  BNPNo results for input(s): BNP, PROBNP in the last 168 hours.  DDimer No results for input(s): DDIMER in the last 168 hours.   Radiology/Studies:  DG Chest 2 View  Result Date: 09/09/2021 CLINICAL DATA:  Chest pain EXAM: CHEST - 2 VIEW COMPARISON:  08/23/2021 FINDINGS: Right CVC in unchanged position. Cardiac and mediastinal contours are within normal limits. Aortic atherosclerosis. No focal pulmonary opacity. No pleural effusion or pneumothorax. No acute osseous abnormality. IMPRESSION: No acute cardiopulmonary process. Electronically Signed   By: Merilyn Baba M.D.   On: 09/09/2021 01:36     Assessment and Plan:   Chest pain  -Occurred in the setting of hypertensive urgency, EKG is unchanged, serial troponin flat and chronically elevated at a level consistent with the previous ED visit.  -Last episode of chest pain prior to last night was prior to the last ED visit on 12/2.  This is the only episode of chest pain in the last 2 weeks.  Chest pain this morning lasted  from 11 AM until 1 AM.  He described the chest pain as a single continuous pressure radiating across from the right to the left side.  Since resolution of the chest pain, he has not had any further chest discomfort.  -Given lack of EKG changes and flat serial troponin, suspect chest pain is related to hypertensive urgency.  Last cardiac catheterization performed on 03/04/2021 at [facility] demonstrated moderate nonobstructive disease.  Hypertensive urgency: On carvedilol, hydralazine and Imdur.  Can potentially consider uptitrate BB dosage on nondialysis days.  Systolic blood pressure was in the 180-200 range on arrival.  Hyperlipidemia: Continue Lipitor  CAD: Diagnosed in June 2022 via cardiac catheterization at [facility].  See catheter report above  PAF: Not on anticoagulation given history of polysubstance abuse and history of GI bleed  End-stage renal disease on hemodialysis Monday Wednesday Friday   Risk Assessment/Risk Scores:     HEAR Score (for undifferentiated chest pain):  HEAR Score: 4    CHA2DS2-VASc Score = 4   This indicates a 4.8% annual risk of stroke. The patient's score is based upon: CHF History: 0 HTN History: 1 Diabetes History: 0 Stroke History: 0 Vascular Disease History: 1 Age Score: 2 Gender Score: 0         For questions or updates, please contact [facility] Please consult [email or web address] for contact info under    Hilbert Corrigan, [place]  09/09/2021 8:03 AM

## 2021-09-09 NOTE — ED Notes (Signed)
Pt given graham crackers.

## 2021-09-19 ENCOUNTER — Other Ambulatory Visit: Payer: Self-pay

## 2021-09-19 ENCOUNTER — Encounter (HOSPITAL_COMMUNITY): Payer: Self-pay | Admitting: Vascular Surgery

## 2021-09-19 NOTE — Progress Notes (Addendum)
Spoke with pt's wife, Pamala Hurry for pre-op call. Pt has hx of CHF, has never had a MI or irregular heart rate. Pt was seen in the ED on 09/09/21 with chest pain. Instructed to follow up with his cardiologist, Dr. Fransico Him. Pamala Hurry states they have not made a follow up appt. Pt is not diabetic.   Pt's surgery is scheduled as ambulatory so no Covid test is required prior to surgery.  Sent to [place], [place] to review.

## 2021-09-20 ENCOUNTER — Encounter (HOSPITAL_COMMUNITY): Payer: Self-pay | Admitting: Physician Assistant

## 2021-09-20 NOTE — Progress Notes (Signed)
Anesthesia Chart Review: Same day workup  Pertinent history includes ESRD on HD Monday Wednesday Friday, HFpEF, paroxysmal A. Fib not on anticoagulation due to history of GI bleeds, CVA, prior history of EtOH abuse, vascular dementia, current smoker.  Recent admission at [facility] in [facility] for atypical chest pain and hypertensive emergency. Per discharge summary 03/02/21, "Patient presented to ED after 2-3 days of chest pain that was occurin at rest was midline and substernal. Patient's pain was relieved with nitroglycerin. Patient had an abnormal dobutamine stress test 12/28/20 for workup of dialysis access; cardiology was panning for outpatient LHC with angiogram. Chest pain likely secondary to hypertensive urgency and renal history; troponins were elevated in the ED and peaked at 0.15 which is similar to previous admissions; EKG without significant changes from previous. 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 Cardiology recommended medical management, starting IMDUR and Ranexa for angina symptoms and will follow outpatient. Patient discharged home on Coreg 6.25mg  BID, Norvasc 10mg  daily, Hydralazine 50mg  TID, IMDUR 30mg  daily, Ranexa 500mg  daily, ASA, and Lipitor."   Recent admission at [facility] in July-August 2022 for symptomatic anemia and COPD exacerbation.  Hemoglobin 6.5 on admission, FOBT positive, received 2 units PRBC. GI consulted and performed a small bowel enteroscopy on 7/28 which was significant for two duodenal non-bleeding angiodysplastic lesions treated with APC.  COPD exacerbation treated with antibiotics and steroids, completed treatment prior to discharge, discharged on home Spiriva and Pulmicort.  Echo showed EF 50 to 55%, normal wall motion,  grade 2 DD, mild mitral stenosis.  He was found to have incidentally positive QuantiFERON gold test, chest x-ray was negative for pulmonary tuberculosis. He followed up outpatient with ID Dr. Tommy Medal and was initiated on isoniazid and vitamin B6.   Patient seen in the ED 09/09/2021 for complaint of chest pain ultimately felt secondary to hypertensive urgency.  Cardiology was consulted.  Per consult note from [place], [place], "1.Chest pain -Occurred in the setting of hypertensive urgency, EKG is unchanged, serial troponin flat and chronically elevated at a level consistent with the previous ED visit. -Last episode of chest pain prior to last night was prior to the last ED visit on 12/2.  This is the only episode of chest pain in the last 2 weeks.  Chest pain this morning lasted from 11 AM until 1 AM.  He described the chest pain as a single continuous pressure radiating across from the right to the left side.  Since resolution of the chest pain, he has not had any further chest discomfort. -Given lack of EKG changes and flat serial troponin, suspect chest pain is related to hypertensive urgency.  Last cardiac catheterization performed on 03/04/2021 at [facility] demonstrated moderate nonobstructive disease."  And per Dr. Godfrey Pick Tobb's subsequent attestation, "We will increase the patient's Imdur to 60 mg on nondialysis days.  His blood pressure has improved significantly.  He is planned dialysis today.  He can be discharged from a cardiology standpoint and follow-up in the outpatient setting."  Patient will need day of surgery labs and evaluation.   EKG 09/09/2021: Normal sinus rhythm with sinus arrhythmia. Septal infarct , age undetermined. ST & T wave abnormality, consider inferolateral ischemia. Prolonged QT (QTc 501).  Significant change.  TTE 04/17/2021:  1. Left ventricular ejection fraction,  by estimation, is 50 to 55%. The  left ventricle has low normal function. The left ventricle has no regional  wall motion  abnormalities. There is mild left ventricular hypertrophy.  Left ventricular diastolic  parameters are consistent with Grade II diastolic dysfunction  (pseudonormalization). Elevated left atrial pressure.   2. Right ventricular systolic function is normal. The right ventricular  size is normal. There is normal pulmonary artery systolic pressure. The  estimated right ventricular systolic pressure is 90.2 mmHg.   3. Left atrial size was moderately dilated.   4. The mitral valve is degenerative. Trivial mitral valve regurgitation.  Mild mitral stenosis. The mean mitral valve gradient is 4.0 mmHg at HR  65bpm. MVA 1.9 cm^2 by coninuity equation.   5. The aortic valve was not well visualized. Aortic valve regurgitation  is not visualized. No aortic stenosis is present.   6. The inferior vena cava is normal in size with <50% respiratory  variability, suggesting right atrial pressure of 8 mmHg.    Cath 03/04/21 (care everywhere): IMPRESSION: 1. Ostial LAD has a lesions with a DFR of 0.90 suggestive that is not significant flow limiting.  2. Proximal PDA 50% lesion.   RECOMMENDATIONS: 1. Continue aggressive risk factor modifications.     Wynonia Musty [facility] Short Stay Center/Anesthesiology Phone [phone] 09/20/2021 12:31 PM

## 2021-09-20 NOTE — Anesthesia Preprocedure Evaluation (Deleted)
Anesthesia Evaluation    Airway        Dental   Pulmonary Current Smoker and Patient abstained from smoking.,           Cardiovascular hypertension,      Neuro/Psych    GI/Hepatic   Endo/Other    Renal/GU      Musculoskeletal   Abdominal   Peds  Hematology   Anesthesia Other Findings   Reproductive/Obstetrics                             Anesthesia Physical Anesthesia Plan  ASA:   Anesthesia Plan:    Post-op Pain Management:    Induction:   PONV Risk Score and Plan:   Airway Management Planned:   Additional Equipment:   Intra-op Plan:   Post-operative Plan:   Informed Consent:   Plan Discussed with:   Anesthesia Plan Comments: (PAT note by Karoline Caldwell, PA-C: Pertinent history includes ESRD on HD Monday Wednesday Friday, HFpEF, paroxysmal A.Fibnot on anticoagulation due to history of GI bleeds, CVA, prior history of EtOH abuse, vascular dementia, current smoker. Recent admission at [facility] in [facility] for atypical chest pain and hypertensive emergency. Per discharge summary 03/02/21, "Patient presented to ED after 2-3 days of chest pain that was occurin at rest was midline and substernal. Patient's pain was relieved with nitroglycerin. Patient had an abnormal dobutamine stress test 12/28/20 for workup of dialysis access; cardiology was panning for outpatient LHC with angiogram. Chest pain likely secondary to hypertensive urgency and renal history; troponins were elevated in the ED and peaked at 0.15 which is similar to previous admissions; EKG without significant changes from previous. 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 Cardiology recommended  medical management, starting IMDUR and Ranexa for angina symptoms and will follow outpatient. Patient discharged home on Coreg 6.25mg  BID, Norvasc 10mg  daily, Hydralazine 50mg  TID, IMDUR 30mg  daily, Ranexa 500mg  daily, ASA, and Lipitor."  Recent admission at [facility] in July-August 2022 for symptomatic anemia and COPD exacerbation. Hemoglobin 6.5 on admission, FOBT positive, received 2 units PRBC. GI consulted and performed a small bowel enteroscopy on 7/28 which was significant for two duodenal non-bleeding angiodysplastic lesions treated with APC. COPD exacerbation treated with antibiotics and steroids, completed treatment prior to discharge, discharged on home Spiriva and Pulmicort. Echo showed EF 50 to 55%, normal wall motion, grade 2 DD, mild mitral stenosis. He was found to have incidentally positive QuantiFERON gold test, chest x-ray was negative for pulmonary tuberculosis. He followed up outpatient with ID Dr. Tommy Medal and was initiated on isoniazid and vitamin B6.  Patient seen in the ED 09/09/2021 for complaint of chest pain ultimately felt secondary to hypertensive urgency.  Cardiology was consulted.  Per consult note from [place], [place], "1.Chest pain -Occurred in the setting of hypertensive urgency, EKG is unchanged, serial troponin flat and chronically elevated at a level consistent with the previous ED visit. -Last episode of chest pain prior to last night was prior to the last ED visit on 12/2.  This is the only episode of chest pain in the last 2 weeks.  Chest pain this morning lasted from 11 AM until 1 AM.  He described the chest pain as a single continuous pressure  radiating across from the right to the left side.  Since resolution of the chest pain, he has not had any further chest discomfort. -Given lack of EKG changes and flat serial troponin, suspect chest pain is related to hypertensive urgency.  Last cardiac catheterization performed on 03/04/2021 at [place] demonstrated moderate  nonobstructive disease."  And per Dr. Godfrey Pick Tobb's subsequent attestation, "We will increase the patient's Imdur to 60 mg on nondialysis days. His blood pressure has improved significantly. He is planned dialysis today. He can be discharged from a cardiology standpoint and follow-up in the outpatient setting."  Patient will need day of surgery labs and evaluation.  EKG 09/09/2021: Normal sinus rhythm with sinus arrhythmia. Septal infarct , age undetermined. ST & T wave abnormality, consider inferolateral ischemia. Prolonged QT (QTc 501).  Significant change.  TTE 04/17/2021: 1. Left ventricular ejection fraction, by estimation, is 50 to 55%. The  left ventricle has low normal function. The left ventricle has no regional  wall motion abnormalities. There is mild left ventricular hypertrophy.  Left ventricular diastolic  parameters are consistent with Grade II diastolic dysfunction  (pseudonormalization). Elevated left atrial pressure.  2. Right ventricular systolic function is normal. The right ventricular  size is normal. There is normal pulmonary artery systolic pressure. The  estimated right ventricular systolic pressure is 88.4 mmHg.  3. Left atrial size was moderately dilated.  4. The mitral valve is degenerative. Trivial mitral valve regurgitation.  Mild mitral stenosis. The mean mitral valve gradient is 4.0 mmHg at HR  65bpm. MVA 1.9 cm^2 by coninuity equation.  5. The aortic valve was not well visualized. Aortic valve regurgitation  is not visualized. No aortic stenosis is present.  6. The inferior vena cava is normal in size with <50% respiratory  variability, suggesting right atrial pressure of 8 mmHg.   Cath 03/04/21 (care everywhere): IMPRESSION: 1. Ostial LAD has a lesions with a DFR of 0.90 suggestive that is not significant flow limiting.  2. Proximal PDA 50% lesion.   RECOMMENDATIONS: 1. Continue aggressive risk factor modifications.  )         Anesthesia Quick Evaluation

## 2021-09-24 ENCOUNTER — Ambulatory Visit (HOSPITAL_COMMUNITY): Admission: RE | Admit: 2021-09-24 | Payer: Medicare Other | Source: Home / Self Care | Admitting: Vascular Surgery

## 2021-09-24 ENCOUNTER — Telehealth: Payer: Self-pay

## 2021-09-24 HISTORY — DX: Anemia, unspecified: D64.9

## 2021-09-24 HISTORY — DX: Pneumonia, unspecified organism: J18.9

## 2021-09-24 SURGERY — INSERTION OF ARTERIOVENOUS (AV) GORE-TEX GRAFT ARM
Anesthesia: Choice | Laterality: Right

## 2021-09-24 NOTE — Telephone Encounter (Signed)
Spoke with patient's wife who contacted office requesting to cancel patient's RUA AVG placement on today, stating patient has been getting too weak after dialysis treatments. She will contact office to reschedule when ready.

## 2021-09-30 ENCOUNTER — Emergency Department (HOSPITAL_COMMUNITY): Payer: Medicare Other

## 2021-09-30 ENCOUNTER — Emergency Department (HOSPITAL_COMMUNITY)
Admission: EM | Admit: 2021-09-30 | Discharge: 2021-09-30 | Disposition: A | Discharge status: Home / Self Care | Payer: Medicare Other | Attending: Emergency Medicine | Admitting: Emergency Medicine

## 2021-09-30 ENCOUNTER — Other Ambulatory Visit: Payer: Self-pay

## 2021-09-30 ENCOUNTER — Encounter (HOSPITAL_COMMUNITY): Payer: Self-pay | Admitting: Emergency Medicine

## 2021-09-30 DIAGNOSIS — R4182 Altered mental status, unspecified: Secondary | ICD-10-CM | POA: Diagnosis not present

## 2021-09-30 DIAGNOSIS — N186 End stage renal disease: Secondary | ICD-10-CM | POA: Diagnosis not present

## 2021-09-30 DIAGNOSIS — R531 Weakness: Secondary | ICD-10-CM | POA: Diagnosis present

## 2021-09-30 DIAGNOSIS — R791 Abnormal coagulation profile: Secondary | ICD-10-CM | POA: Insufficient documentation

## 2021-09-30 DIAGNOSIS — F17200 Nicotine dependence, unspecified, uncomplicated: Secondary | ICD-10-CM

## 2021-09-30 DIAGNOSIS — Z7982 Long term (current) use of aspirin: Secondary | ICD-10-CM | POA: Insufficient documentation

## 2021-09-30 DIAGNOSIS — G9341 Metabolic encephalopathy: Secondary | ICD-10-CM

## 2021-09-30 DIAGNOSIS — I480 Paroxysmal atrial fibrillation: Secondary | ICD-10-CM

## 2021-09-30 DIAGNOSIS — Z992 Dependence on renal dialysis: Secondary | ICD-10-CM | POA: Insufficient documentation

## 2021-09-30 LAB — CBC
HCT: 37.5 % — ABNORMAL LOW (ref 39.0–52.0)
Hemoglobin: 11.6 g/dL — ABNORMAL LOW (ref 13.0–17.0)
MCH: 28.6 pg (ref 26.0–34.0)
MCHC: 30.9 g/dL (ref 30.0–36.0)
MCV: 92.6 fL (ref 80.0–100.0)
Platelets: 174 10*3/uL (ref 150–400)
RBC: 4.05 MIL/uL — ABNORMAL LOW (ref 4.22–5.81)
RDW: 23.5 % — ABNORMAL HIGH (ref 11.5–15.5)
WBC: 10.1 10*3/uL (ref 4.0–10.5)
nRBC: 0.9 % — ABNORMAL HIGH (ref 0.0–0.2)

## 2021-09-30 LAB — DIFFERENTIAL
Abs Immature Granulocytes: 0.08 10*3/uL — ABNORMAL HIGH (ref 0.00–0.07)
Basophils Absolute: 0 10*3/uL (ref 0.0–0.1)
Basophils Relative: 0 %
Eosinophils Absolute: 0 10*3/uL (ref 0.0–0.5)
Eosinophils Relative: 0 %
Immature Granulocytes: 1 %
Lymphocytes Relative: 10 %
Lymphs Abs: 1 10*3/uL (ref 0.7–4.0)
Monocytes Absolute: 0.9 10*3/uL (ref 0.1–1.0)
Monocytes Relative: 9 %
Neutro Abs: 8 10*3/uL — ABNORMAL HIGH (ref 1.7–7.7)
Neutrophils Relative %: 80 %

## 2021-09-30 LAB — I-STAT CHEM 8, ED
BUN: 26 mg/dL — ABNORMAL HIGH (ref 8–23)
Calcium, Ion: 0.9 mmol/L — ABNORMAL LOW (ref 1.15–1.40)
Chloride: 97 mmol/L — ABNORMAL LOW (ref 98–111)
Creatinine, Ser: 5.6 mg/dL — ABNORMAL HIGH (ref 0.61–1.24)
Glucose, Bld: 83 mg/dL (ref 70–99)
HCT: 43 % (ref 39.0–52.0)
Hemoglobin: 14.6 g/dL (ref 13.0–17.0)
Potassium: 4.3 mmol/L (ref 3.5–5.1)
Sodium: 136 mmol/L (ref 135–145)
TCO2: 32 mmol/L (ref 22–32)

## 2021-09-30 LAB — PROTIME-INR
INR: 1.3 — ABNORMAL HIGH (ref 0.8–1.2)
Prothrombin Time: 15.7 seconds — ABNORMAL HIGH (ref 11.4–15.2)

## 2021-09-30 LAB — COMPREHENSIVE METABOLIC PANEL
ALT: 7 U/L (ref 0–44)
AST: 24 U/L (ref 15–41)
Albumin: 3 g/dL — ABNORMAL LOW (ref 3.5–5.0)
Alkaline Phosphatase: 88 U/L (ref 38–126)
Anion gap: 11 (ref 5–15)
BUN: 19 mg/dL (ref 8–23)
CO2: 27 mmol/L (ref 22–32)
Calcium: 7.8 mg/dL — ABNORMAL LOW (ref 8.9–10.3)
Chloride: 97 mmol/L — ABNORMAL LOW (ref 98–111)
Creatinine, Ser: 5.28 mg/dL — ABNORMAL HIGH (ref 0.61–1.24)
GFR, Estimated: 10 mL/min — ABNORMAL LOW (ref >60)
Glucose, Bld: 81 mg/dL (ref 70–99)
Potassium: 4.2 mmol/L (ref 3.5–5.1)
Sodium: 135 mmol/L (ref 135–145)
Total Bilirubin: 0.9 mg/dL (ref 0.3–1.2)
Total Protein: 7.3 g/dL (ref 6.5–8.1)

## 2021-09-30 LAB — APTT: aPTT: 34 seconds (ref 24–36)

## 2021-09-30 LAB — CBG MONITORING, ED: Glucose-Capillary: 80 mg/dL (ref 70–99)

## 2021-09-30 MED ORDER — SODIUM CHLORIDE 0.9 % IV BOLUS
500.0000 mL | Freq: Once | INTRAVENOUS | Status: AC
  Administered 2021-09-30: 500 mL via INTRAVENOUS

## 2021-09-30 MED ORDER — ASPIRIN 300 MG RE SUPP
300.0000 mg | Freq: Once | RECTAL | Status: AC
  Administered 2021-09-30: 300 mg via RECTAL
  Filled 2021-09-30: qty 1

## 2021-09-30 MED ORDER — SODIUM CHLORIDE 0.9% FLUSH
3.0000 mL | Freq: Once | INTRAVENOUS | Status: AC
  Administered 2021-09-30: 3 mL via INTRAVENOUS

## 2021-09-30 NOTE — Discharge Instructions (Addendum)
It was our pleasure to provide your ER care today - we hope that you feel better.  Follow up closely with your primary care doctor in the coming week.   Given recent generalized weakness after dialysis treatment(s) - discuss this with your dialysis doctors/team at next dialysis and discuss whether any adjustment to dialysis would help.   Drink adequate fluids/stay well hydrated. Eat balanced diet/get adequate nutrition.  Return to ER right away if worse, new symptoms, fevers, chest pain, trouble breathing, new or severe pain, abdominal pain, persistent vomiting, weak/fainting, or other concern.

## 2021-09-30 NOTE — ED Provider Notes (Signed)
[facility] EMERGENCY DEPARTMENT Provider Note   CSN: [id number] Arrival date & time: 09/30/21  1647  An emergency department physician performed an initial assessment on this suspected stroke patient at 1649.  History  Chief Complaint  Patient presents with   Code Stroke   Altered Mental Status    Anthony [name] is a 81 y.o. male.  Patient presents via EMS from dialysis center with altered mental status and generalized weakness. Pt had completed his normal dialysis when had acute onset of above symptoms. No focal or unilateral numbness or weakness noted. Pt denies change in speech or vision. EMS notes glucose was normal, and blood pressure low 100's. Pt limited historian - level 5 caveat. No report of trauma/fall. No report of fevers.   The history is provided by the patient, the EMS personnel and medical records. The history is limited by the condition of the patient.      Home Medications Prior to Admission medications   Medication Sig Start Date End Date Taking? Authorizing Provider  ADVAIR HFA [id number] MCG/ACT inhaler INHALE 2 PUFFS INTO THE LUNGS IN THE MORNING AND AT BEDTIME Patient not taking: Reported on 08/17/2021 01/17/21   Isaac Bliss, Rayford Halsted, MD  albuterol (PROVENTIL HFA) 108 (90 Base) MCG/ACT inhaler Inhale 2 puffs into the lungs every 4 (four) hours as needed for wheezing or shortness of breath. 03/08/20   Laurey Morale, MD  ASPIRIN LOW DOSE 81 MG chewable tablet Chew 81 mg by mouth daily. 03/05/21   [provider]  atorvastatin (LIPITOR) 20 MG tablet TAKE 1 TABLET BY MOUTH EVERYDAY AT BEDTIME Patient taking differently: Take 20 mg by mouth daily. 07/30/20   Isaac Bliss, Rayford Halsted, MD  budesonide-formoterol [facility]) 160-4.5 MCG/ACT inhaler Inhale 2 puffs into the lungs daily. 04/12/21   [provider]  calcium acetate (PHOSLO) 667 MG capsule Take 667 mg by mouth 3 (three) times daily with meals.    [provider]   carboxymethylcellulose (REFRESH PLUS) 0.5 % SOLN Place 1 drop into both eyes daily as needed (dry eyes). 12/07/20   [provider]  carvedilol (COREG) 6.25 MG tablet Take 1 tablet (6.25 mg total) by mouth 2 (two) times daily with a meal. 03/02/20   Black, Lezlie Octave, NP  hydrALAZINE (APRESOLINE) 50 MG tablet Take 50 mg by mouth every 8 (eight) hours.    [provider]  isoniazid (NYDRAZID) 300 MG tablet TAKE 1 TABLET BY MOUTH EVERY DAY Patient not taking: Reported on 09/09/2021 07/26/21   Tommy Medal, Lavell Islam, MD  isosorbide mononitrate (IMDUR) 30 MG 24 hr tablet Take 30 mg by mouth daily. 04/02/21   [provider]  lidocaine-prilocaine (EMLA) cream Apply 1 application topically daily as needed (port access).    [provider]  memantine (NAMENDA) 5 MG tablet Take 5 mg by mouth daily. 03/12/21   [provider]  nicotine (NICODERM CQ - DOSED IN MG/24 HOURS) 14 mg/24hr patch Place 1 patch (14 mg total) onto the skin daily. Patient not taking: Reported on 08/17/2021 03/15/20   Isaac Bliss, Rayford Halsted, MD  oxyCODONE-acetaminophen (PERCOCET) 10-325 MG tablet Take 1 tablet by mouth every 6 (six) hours as needed for pain. Patient not taking: Reported on 08/17/2021 05/14/21 05/14/22  Vaughan Basta, Edman Circle, PA-C  pantoprazole (PROTONIX) 40 MG tablet Take 1 tablet (40 mg total) by mouth daily. 04/24/21   Mariel Aloe, MD  pyridOXINE (VITAMIN B-6) 50 MG tablet Take 1 tablet (50 mg total)  by mouth daily. 05/16/21 02/10/22  Truman Hayward, MD  Tiotropium Bromide Monohydrate (SPIRIVA RESPIMAT) 2.5 MCG/ACT AERS Inhale 2 puffs into the lungs daily. 03/15/20   Isaac Bliss, Rayford Halsted, MD  traZODone (DESYREL) 50 MG tablet Take 1-2 tablets (50-100 mg total) by mouth at bedtime as needed. Patient taking differently: Take 50-100 mg by mouth at bedtime as needed for sleep. 06/12/21   Isaac Bliss, Rayford Halsted, MD      Allergies    Patient has no known allergies.     Review of Systems   Review of Systems  Constitutional:  Negative for chills and fever.  Eyes:  Negative for visual disturbance.  Cardiovascular:  Negative for chest pain.  Gastrointestinal:  Negative for abdominal pain and blood in stool.  Genitourinary:  Negative for dysuria and flank pain.  Musculoskeletal:  Negative for back pain and neck pain.  Neurological:  Negative for speech difficulty.   Physical Exam Updated Vital Signs BP (!) 104/56    Pulse (!) 50    Temp (!) 97.5 F (36.4 C) (Oral)    Resp 14    Wt 67 kg    SpO2 97%    BMI 23.13 kg/m  Physical Exam Vitals and nursing note reviewed.  Constitutional:      Appearance: Normal appearance. He is well-developed.  HENT:     Head: Atraumatic.     Nose: Nose normal.     Mouth/Throat:     Mouth: Mucous membranes are moist.     Pharynx: Oropharynx is clear.  Eyes:     General: No scleral icterus.    Conjunctiva/sclera: Conjunctivae normal.     Pupils: Pupils are equal, round, and reactive to light.  Neck:     Vascular: No carotid bruit.     Trachea: No tracheal deviation.     Comments: No stiffness or rigidity.  Cardiovascular:     Rate and Rhythm: Normal rate and regular rhythm.     Pulses: Normal pulses.     Heart sounds: Normal heart sounds. No murmur heard.   No friction rub. No gallop.  Pulmonary:     Effort: Pulmonary effort is normal. No accessory muscle usage or respiratory distress.     Breath sounds: Normal breath sounds.  Abdominal:     General: Bowel sounds are normal. There is no distension.     Palpations: Abdomen is soft.     Tenderness: There is no abdominal tenderness. There is no guarding.  Genitourinary:    Comments: No cva tenderness. Musculoskeletal:        General: No swelling or tenderness.     Cervical back: Normal range of motion and neck supple. No rigidity.  Skin:    General: Skin is warm and dry.     Findings: No rash.  Neurological:     Mental Status: He is alert.     Comments:  Alert, speech clear. No gross dysarthria or aphasia. Motor/sens grossly intact bil.   Psychiatric:     Comments: Weak appearing, somewhat slow to respond.     ED Results / Procedures / Treatments   Labs (all labs ordered are listed, but only abnormal results are displayed) Results for orders placed or performed during the hospital encounter of 09/30/21  Protime-INR  Result Value Ref Range   Prothrombin Time 15.7 (H) 11.4 - 15.2 seconds   INR 1.3 (H) 0.8 - 1.2  APTT  Result Value Ref Range   aPTT 34 24 - 36 seconds  CBC  Result Value Ref Range   WBC 10.1 4.0 - 10.5 K/uL   RBC 4.05 (L) 4.22 - 5.81 MIL/uL   Hemoglobin 11.6 (L) 13.0 - 17.0 g/dL   HCT 37.5 (L) 39.0 - 52.0 %   MCV 92.6 80.0 - 100.0 fL   MCH 28.6 26.0 - 34.0 pg   MCHC 30.9 30.0 - 36.0 g/dL   RDW 23.5 (H) 11.5 - 15.5 %   Platelets 174 150 - 400 K/uL   nRBC 0.9 (H) 0.0 - 0.2 %  Differential  Result Value Ref Range   Neutrophils Relative % 80 %   Neutro Abs 8.0 (H) 1.7 - 7.7 K/uL   Lymphocytes Relative 10 %   Lymphs Abs 1.0 0.7 - 4.0 K/uL   Monocytes Relative 9 %   Monocytes Absolute 0.9 0.1 - 1.0 K/uL   Eosinophils Relative 0 %   Eosinophils Absolute 0.0 0.0 - 0.5 K/uL   Basophils Relative 0 %   Basophils Absolute 0.0 0.0 - 0.1 K/uL   Immature Granulocytes 1 %   Abs Immature Granulocytes 0.08 (H) 0.00 - 0.07 K/uL  Comprehensive metabolic panel  Result Value Ref Range   Sodium 135 135 - 145 mmol/L   Potassium 4.2 3.5 - 5.1 mmol/L   Chloride 97 (L) 98 - 111 mmol/L   CO2 27 22 - 32 mmol/L   Glucose, Bld 81 70 - 99 mg/dL   BUN 19 8 - 23 mg/dL   Creatinine, Ser 5.28 (H) 0.61 - 1.24 mg/dL   Calcium 7.8 (L) 8.9 - 10.3 mg/dL   Total Protein 7.3 6.5 - 8.1 g/dL   Albumin 3.0 (L) 3.5 - 5.0 g/dL   AST 24 15 - 41 U/L   ALT 7 0 - 44 U/L   Alkaline Phosphatase 88 38 - 126 U/L   Total Bilirubin 0.9 0.3 - 1.2 mg/dL   GFR, Estimated 10 (L) >60 mL/min   Anion gap 11 5 - 15  I-stat chem 8, ED  Result Value Ref Range    Sodium 136 135 - 145 mmol/L   Potassium 4.3 3.5 - 5.1 mmol/L   Chloride 97 (L) 98 - 111 mmol/L   BUN 26 (H) 8 - 23 mg/dL   Creatinine, Ser 5.60 (H) 0.61 - 1.24 mg/dL   Glucose, Bld 83 70 - 99 mg/dL   Calcium, Ion 0.90 (L) 1.15 - 1.40 mmol/L   TCO2 32 22 - 32 mmol/L   Hemoglobin 14.6 13.0 - 17.0 g/dL   HCT 43.0 39.0 - 52.0 %  CBG monitoring, ED  Result Value Ref Range   Glucose-Capillary 80 70 - 99 mg/dL   DG Chest 2 View  Result Date: 09/09/2021 CLINICAL DATA:  Chest pain EXAM: CHEST - 2 VIEW COMPARISON:  08/23/2021 FINDINGS: Right CVC in unchanged position. Cardiac and mediastinal contours are within normal limits. Aortic atherosclerosis. No focal pulmonary opacity. No pleural effusion or pneumothorax. No acute osseous abnormality. IMPRESSION: No acute cardiopulmonary process. Electronically Signed   By: Merilyn Baba M.D.   On: 09/09/2021 01:36   MR ANGIO HEAD WO CONTRAST  Result Date: 09/30/2021 CLINICAL DATA:  Follow-up examination for acute stroke. EXAM: MRI HEAD WITHOUT CONTRAST MRA HEAD WITHOUT CONTRAST TECHNIQUE: Multiplanar, multi-echo pulse sequences of the brain and surrounding structures were acquired without intravenous contrast. Angiographic images of the Circle of Willis were acquired using MRA technique without intravenous contrast. COMPARISON:  CT from earlier the same day. FINDINGS: MRI HEAD FINDINGS Brain: Examination degraded by motion  artifact. Cerebral volume within normal limits for age. Patchy and confluent T2/FLAIR hyperintensity seen involving the periventricular deep white matter both cerebral hemispheres, most consistent with chronic small vessel ischemic disease, moderate in nature. Remote lacunar infarct present at the anterior right basal ganglia. No abnormal foci of restricted diffusion to suggest acute or subacute ischemia. Gray-white matter differentiation maintained. No areas of chronic cortical infarction. No acute intracranial hemorrhage. Single punctate  chronic microhemorrhage noted at the right frontal lobe, of doubtful significance in isolation. No mass lesion, midline shift or mass effect. No hydrocephalus or extra-axial fluid collection. Pituitary gland suprasellar region within normal limits. Vascular: Major intracranial vascular flow voids are grossly maintained at the skull base. Skull and upper cervical spine: Craniocervical junction within normal limits. Bone marrow signal intensity grossly within normal limits. No scalp soft tissue abnormality. Sinuses/Orbits: Globes and orbital soft tissues demonstrate no acute finding. Paranasal sinuses are largely clear. No mastoid effusion. Other: None. MRA HEAD FINDINGS Anterior circulation: Examination moderately degraded by motion artifact. Both internal carotid arteries patent to the termini without visible stenosis or other abnormality. A1 segments patent bilaterally. Grossly normal anterior communicating artery complex. Anterior cerebral arteries patent without visible stenosis. No M1 stenosis or occlusion. Grossly negative MCA bifurcations. Distal MCA branches perfused and symmetric. Posterior circulation: Visualized V4 segments patent without stenosis. Right vertebral artery dominant. Left PICA patent at its origin. Right PICA not well seen. Basilar patent to its distal aspect without stenosis. Superior cerebellar arteries grossly patent at their origins. Both PCA supplied via the basilar as well as robust bilateral posterior communicating arteries. PCAs appear grossly patent on 3D time-of-flight sequence. Anatomic variants: Predominant fetal type origin of the PCAs. No visible aneurysm or other vascular abnormality. IMPRESSION: MRI HEAD IMPRESSION: 1. Motion degraded exam. 2. No acute intracranial abnormality. 3. Age-related cerebral atrophy with moderate chronic small vessel ischemic disease, with superimposed remote lacunar infarct at the anterior right basal ganglia. MRA HEAD IMPRESSION: 1. Motion degraded  exam. 2. Grossly negative intracranial MRA. No large vessel occlusion, hemodynamically significant stenosis, or other acute vascular abnormality. Electronically Signed   By: Jeannine Boga M.D.   On: 09/30/2021 21:31   MR BRAIN WO CONTRAST  Result Date: 09/30/2021 CLINICAL DATA:  Follow-up examination for acute stroke. EXAM: MRI HEAD WITHOUT CONTRAST MRA HEAD WITHOUT CONTRAST TECHNIQUE: Multiplanar, multi-echo pulse sequences of the brain and surrounding structures were acquired without intravenous contrast. Angiographic images of the Circle of Willis were acquired using MRA technique without intravenous contrast. COMPARISON:  CT from earlier the same day. FINDINGS: MRI HEAD FINDINGS Brain: Examination degraded by motion artifact. Cerebral volume within normal limits for age. Patchy and confluent T2/FLAIR hyperintensity seen involving the periventricular deep white matter both cerebral hemispheres, most consistent with chronic small vessel ischemic disease, moderate in nature. Remote lacunar infarct present at the anterior right basal ganglia. No abnormal foci of restricted diffusion to suggest acute or subacute ischemia. Gray-white matter differentiation maintained. No areas of chronic cortical infarction. No acute intracranial hemorrhage. Single punctate chronic microhemorrhage noted at the right frontal lobe, of doubtful significance in isolation. No mass lesion, midline shift or mass effect. No hydrocephalus or extra-axial fluid collection. Pituitary gland suprasellar region within normal limits. Vascular: Major intracranial vascular flow voids are grossly maintained at the skull base. Skull and upper cervical spine: Craniocervical junction within normal limits. Bone marrow signal intensity grossly within normal limits. No scalp soft tissue abnormality. Sinuses/Orbits: Globes and orbital soft tissues demonstrate no acute finding. Paranasal sinuses  are largely clear. No mastoid effusion. Other: None. MRA  HEAD FINDINGS Anterior circulation: Examination moderately degraded by motion artifact. Both internal carotid arteries patent to the termini without visible stenosis or other abnormality. A1 segments patent bilaterally. Grossly normal anterior communicating artery complex. Anterior cerebral arteries patent without visible stenosis. No M1 stenosis or occlusion. Grossly negative MCA bifurcations. Distal MCA branches perfused and symmetric. Posterior circulation: Visualized V4 segments patent without stenosis. Right vertebral artery dominant. Left PICA patent at its origin. Right PICA not well seen. Basilar patent to its distal aspect without stenosis. Superior cerebellar arteries grossly patent at their origins. Both PCA supplied via the basilar as well as robust bilateral posterior communicating arteries. PCAs appear grossly patent on 3D time-of-flight sequence. Anatomic variants: Predominant fetal type origin of the PCAs. No visible aneurysm or other vascular abnormality. IMPRESSION: MRI HEAD IMPRESSION: 1. Motion degraded exam. 2. No acute intracranial abnormality. 3. Age-related cerebral atrophy with moderate chronic small vessel ischemic disease, with superimposed remote lacunar infarct at the anterior right basal ganglia. MRA HEAD IMPRESSION: 1. Motion degraded exam. 2. Grossly negative intracranial MRA. No large vessel occlusion, hemodynamically significant stenosis, or other acute vascular abnormality. Electronically Signed   By: Jeannine Boga M.D.   On: 09/30/2021 21:31   CT HEAD CODE STROKE WO CONTRAST  Result Date: 09/30/2021 CLINICAL DATA:  Code stroke.  Confusion, acute neuro deficit EXAM: CT HEAD WITHOUT CONTRAST TECHNIQUE: Contiguous axial images were obtained from the base of the skull through the vertex without intravenous contrast. COMPARISON:  None. FINDINGS: Brain: Hypodensities in the anterior right basal ganglia, which are favored to be chronic. No evidence of acute cortical infarction,  hemorrhage, cerebral edema, mass, mass effect, or midline shift. Ventricles and sulci are normal for age. No extra-axial fluid collection. Periventricular white matter changes, likely the sequela of chronic small vessel ischemic disease. Vascular: No hyperdense vessel or unexpected calcification. Atherosclerotic calcifications in the intracranial carotid and vertebral arteries. Skull: Normal. Negative for fracture or focal lesion. Sinuses/Orbits: No acute finding. Other: The mastoid air cells are well aerated. ASPECTS [facility] Stroke Program Early CT Score) - Ganglionic level infarction (caudate, lentiform nuclei, internal capsule, insula, M1-M3 cortex): 7 - Supraganglionic infarction (M4-M6 cortex): 3 Total score (0-10 with 10 being normal): 10 IMPRESSION: 1. Hypodensity in the anterior right basal ganglia, favored to be chronic infarcts. 2. ASPECTS is 10 Code stroke imaging results were communicated on 09/30/2021 at 5:08 pm to provider Dr. Erlinda Hong via secure text paging. Electronically Signed   By: Merilyn Baba M.D.   On: 09/30/2021 17:08     ED ECG REPORT   Date: 09/30/2021  Rate: 66  Rhythm: normal sinus rhythm  QRS Axis: left  Intervals: normal  ST/T Wave abnormalities: nonspecific ST/T changes  Conduction Disutrbances:left anterior fascicular block  Narrative Interpretation:   Old EKG Reviewed: changes noted  I have personally reviewed the EKG tracing    Radiology MR ANGIO HEAD WO CONTRAST  Result Date: 09/30/2021 CLINICAL DATA:  Follow-up examination for acute stroke. EXAM: MRI HEAD WITHOUT CONTRAST MRA HEAD WITHOUT CONTRAST TECHNIQUE: Multiplanar, multi-echo pulse sequences of the brain and surrounding structures were acquired without intravenous contrast. Angiographic images of the Circle of Willis were acquired using MRA technique without intravenous contrast. COMPARISON:  CT from earlier the same day. FINDINGS: MRI HEAD FINDINGS Brain: Examination degraded by motion artifact. Cerebral volume  within normal limits for age. Patchy and confluent T2/FLAIR hyperintensity seen involving the periventricular deep white matter both cerebral hemispheres, most  consistent with chronic small vessel ischemic disease, moderate in nature. Remote lacunar infarct present at the anterior right basal ganglia. No abnormal foci of restricted diffusion to suggest acute or subacute ischemia. Gray-white matter differentiation maintained. No areas of chronic cortical infarction. No acute intracranial hemorrhage. Single punctate chronic microhemorrhage noted at the right frontal lobe, of doubtful significance in isolation. No mass lesion, midline shift or mass effect. No hydrocephalus or extra-axial fluid collection. Pituitary gland suprasellar region within normal limits. Vascular: Major intracranial vascular flow voids are grossly maintained at the skull base. Skull and upper cervical spine: Craniocervical junction within normal limits. Bone marrow signal intensity grossly within normal limits. No scalp soft tissue abnormality. Sinuses/Orbits: Globes and orbital soft tissues demonstrate no acute finding. Paranasal sinuses are largely clear. No mastoid effusion. Other: None. MRA HEAD FINDINGS Anterior circulation: Examination moderately degraded by motion artifact. Both internal carotid arteries patent to the termini without visible stenosis or other abnormality. A1 segments patent bilaterally. Grossly normal anterior communicating artery complex. Anterior cerebral arteries patent without visible stenosis. No M1 stenosis or occlusion. Grossly negative MCA bifurcations. Distal MCA branches perfused and symmetric. Posterior circulation: Visualized V4 segments patent without stenosis. Right vertebral artery dominant. Left PICA patent at its origin. Right PICA not well seen. Basilar patent to its distal aspect without stenosis. Superior cerebellar arteries grossly patent at their origins. Both PCA supplied via the basilar as well as  robust bilateral posterior communicating arteries. PCAs appear grossly patent on 3D time-of-flight sequence. Anatomic variants: Predominant fetal type origin of the PCAs. No visible aneurysm or other vascular abnormality. IMPRESSION: MRI HEAD IMPRESSION: 1. Motion degraded exam. 2. No acute intracranial abnormality. 3. Age-related cerebral atrophy with moderate chronic small vessel ischemic disease, with superimposed remote lacunar infarct at the anterior right basal ganglia. MRA HEAD IMPRESSION: 1. Motion degraded exam. 2. Grossly negative intracranial MRA. No large vessel occlusion, hemodynamically significant stenosis, or other acute vascular abnormality. Electronically Signed   By: Jeannine Boga M.D.   On: 09/30/2021 21:31   MR BRAIN WO CONTRAST  Result Date: 09/30/2021 CLINICAL DATA:  Follow-up examination for acute stroke. EXAM: MRI HEAD WITHOUT CONTRAST MRA HEAD WITHOUT CONTRAST TECHNIQUE: Multiplanar, multi-echo pulse sequences of the brain and surrounding structures were acquired without intravenous contrast. Angiographic images of the Circle of Willis were acquired using MRA technique without intravenous contrast. COMPARISON:  CT from earlier the same day. FINDINGS: MRI HEAD FINDINGS Brain: Examination degraded by motion artifact. Cerebral volume within normal limits for age. Patchy and confluent T2/FLAIR hyperintensity seen involving the periventricular deep white matter both cerebral hemispheres, most consistent with chronic small vessel ischemic disease, moderate in nature. Remote lacunar infarct present at the anterior right basal ganglia. No abnormal foci of restricted diffusion to suggest acute or subacute ischemia. Gray-white matter differentiation maintained. No areas of chronic cortical infarction. No acute intracranial hemorrhage. Single punctate chronic microhemorrhage noted at the right frontal lobe, of doubtful significance in isolation. No mass lesion, midline shift or mass effect.  No hydrocephalus or extra-axial fluid collection. Pituitary gland suprasellar region within normal limits. Vascular: Major intracranial vascular flow voids are grossly maintained at the skull base. Skull and upper cervical spine: Craniocervical junction within normal limits. Bone marrow signal intensity grossly within normal limits. No scalp soft tissue abnormality. Sinuses/Orbits: Globes and orbital soft tissues demonstrate no acute finding. Paranasal sinuses are largely clear. No mastoid effusion. Other: None. MRA HEAD FINDINGS Anterior circulation: Examination moderately degraded by motion artifact. Both internal carotid arteries patent  to the termini without visible stenosis or other abnormality. A1 segments patent bilaterally. Grossly normal anterior communicating artery complex. Anterior cerebral arteries patent without visible stenosis. No M1 stenosis or occlusion. Grossly negative MCA bifurcations. Distal MCA branches perfused and symmetric. Posterior circulation: Visualized V4 segments patent without stenosis. Right vertebral artery dominant. Left PICA patent at its origin. Right PICA not well seen. Basilar patent to its distal aspect without stenosis. Superior cerebellar arteries grossly patent at their origins. Both PCA supplied via the basilar as well as robust bilateral posterior communicating arteries. PCAs appear grossly patent on 3D time-of-flight sequence. Anatomic variants: Predominant fetal type origin of the PCAs. No visible aneurysm or other vascular abnormality. IMPRESSION: MRI HEAD IMPRESSION: 1. Motion degraded exam. 2. No acute intracranial abnormality. 3. Age-related cerebral atrophy with moderate chronic small vessel ischemic disease, with superimposed remote lacunar infarct at the anterior right basal ganglia. MRA HEAD IMPRESSION: 1. Motion degraded exam. 2. Grossly negative intracranial MRA. No large vessel occlusion, hemodynamically significant stenosis, or other acute vascular  abnormality. Electronically Signed   By: Jeannine Boga M.D.   On: 09/30/2021 21:31   CT HEAD CODE STROKE WO CONTRAST  Result Date: 09/30/2021 CLINICAL DATA:  Code stroke.  Confusion, acute neuro deficit EXAM: CT HEAD WITHOUT CONTRAST TECHNIQUE: Contiguous axial images were obtained from the base of the skull through the vertex without intravenous contrast. COMPARISON:  None. FINDINGS: Brain: Hypodensities in the anterior right basal ganglia, which are favored to be chronic. No evidence of acute cortical infarction, hemorrhage, cerebral edema, mass, mass effect, or midline shift. Ventricles and sulci are normal for age. No extra-axial fluid collection. Periventricular white matter changes, likely the sequela of chronic small vessel ischemic disease. Vascular: No hyperdense vessel or unexpected calcification. Atherosclerotic calcifications in the intracranial carotid and vertebral arteries. Skull: Normal. Negative for fracture or focal lesion. Sinuses/Orbits: No acute finding. Other: The mastoid air cells are well aerated. ASPECTS [facility] Stroke Program Early CT Score) - Ganglionic level infarction (caudate, lentiform nuclei, internal capsule, insula, M1-M3 cortex): 7 - Supraganglionic infarction (M4-M6 cortex): 3 Total score (0-10 with 10 being normal): 10 IMPRESSION: 1. Hypodensity in the anterior right basal ganglia, favored to be chronic infarcts. 2. ASPECTS is 10 Code stroke imaging results were communicated on 09/30/2021 at 5:08 pm to provider Dr. Erlinda Hong via secure text paging. Electronically Signed   By: Merilyn Baba M.D.   On: 09/30/2021 17:08    Procedures Procedures    Medications Ordered in ED Medications  sodium chloride flush (NS) 0.9 % injection 3 mL (3 mLs Intravenous Given 09/30/21 1749)  sodium chloride 0.9 % bolus 500 mL (0 mLs Intravenous Stopped 09/30/21 1829)  aspirin suppository 300 mg (300 mg Rectal Given 09/30/21 2020)    ED Course/ Medical Decision Making/ A&P                            Medical Decision Making  Iv ns. Continuous pulse ox and cardiac monitoring. Ecg. Stat labs.   Cardiac monitoring - nsr rate 70s.   Bp is soft, pt just had dialysis, ?volume depletion, 500 cc ns bolus.   Po fluids/food.  Pt had been code stroke activation by EMS prior to/on arrival - neurology emergently consulted and met patient at bridge.   Reviewed nursing notes and prior external charts for additional history. On review medical records, note made of prior ED visit in past couple months post dialysis with similar sounding symptoms.  Pt also noted in telephone encounter to have recent general weakness post his dialysis treatments.   Labs reviewed/interpreted by me - k normal.   CT reviewed/interpreted by me - no hem.   Neurology rec mri.    MRI reviewed/interpreted by me - no def cva.  On exam, no focal or unilateral neuro findings on exam. Pt is fully awake and alert. No c/o. No headache. No chest pain.   Pt currently appears stable for d/c.   Rec close pcp f/u.  Return precautions provided.           Final Clinical Impression(s) / ED Diagnoses Final diagnoses:  None    Rx / DC Orders ED Discharge Orders     None         Lajean Saver, MD 09/30/21 2206

## 2021-09-30 NOTE — ED Triage Notes (Signed)
Pt bib [facility] from dialysis. Pt LKN is 1545 after dialysis treatment. Pt received 4 hours of dialysis and had 1.2 kg of fluid removed. Pt become altered after. Upon EMS arrival Code Stroke was called, Neuro at bedside.   EMS vitals 135/40 BP 89 HR [id number]

## 2021-09-30 NOTE — Code Documentation (Signed)
Stroke Response Nurse Documentation Code Documentation  Anthony [name] is a 81 y.o. male arriving to [facility] ED via [facility] on 09/30/21 with past medical hx of ESRD on dialysis. On No antithrombotic. Code stroke was activated by EDP. BP 135/40, CBG 70.   Patient from dialysis where he was LKW at 1545. He had a 4 hour treatment with some soft BP's and now complaining of confusion and weakness. 1.2 kg removed during dialysis. Pt then became confused. EMS called and brought pt to [facility]. Code stroke activated at the bridge.   Stroke team at the bedside on patient arrival. Labs drawn and patient cleared for CT by Dr. Ashok Cordia. Patient to CT with team. NIHSS 5, see documentation for details and code stroke times. Patient with left leg weakness, left limb ataxia, left decreased sensation, dysarthria , and Sensory  neglect on exam. The following imaging was completed:  CT head. Patient is not a candidate for IV Thrombolytic due to symptoms too mild. Thinking this is a response to dialysis.  Patient is not a candidate for IR due to no LVO suspected.   Care/Plan: Q2 neuro checks, BP<180/105, MRI, admission.   Bedside handoff with ED RN Alexa.    Gwynevere Lizana, Rande Brunt  Stroke Response RN

## 2021-09-30 NOTE — Consult Note (Signed)
Stroke Neurology Consultation Note  Consult Requested by: Dr. Ashok Cordia  Reason for Consult: code stroke  Consult Date: 09/30/21   The history was obtained from the EMS and chart review.  During history and examination, all items were not able to obtain given pt poor historian.  History of Present Illness:  Anthony [name] is a 81 y.o. 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   Per report, he was at HD center and had 4 hour treatment with some soft BPs down to as low as 104/63 and removed 1.2 L fluid. Last seen well 1545 and he was later found to be confused and disorientated. Per EMS, pt normally AAO x 4. However, pt does have vascular dementia diagnosis on Namenda. EMS did not find focal deficit. Glucose at HD center 70, with EMS 89. BP in ER 135/40 and glucose 80. In ER pt fluctuating with mental status, orientated to place, and initially wrong on age but after CT he was orientated to age. Not orientated to year but orientated to month. Left leg drift but complained left leg pain. Not cooperative to sensation exam. CT no acute finding but old right caudate head infarct.   He is on lipitor at home but not on any antithrombotics.   LSN: [id number] today tPA Given: No: no clear focal deficit, likely not stroke IR: no, no sign of LVO, and no clear focal deficit  Past Medical History:  Diagnosis Date   Alcohol abuse 2021   Anemia    Arthritis    CHF (congestive heart failure) ([place])    Chicken pox    Chronic kidney disease    M W F   COVID 02/2021   Dementia ([place])    mild   Dyspnea    ESRD (end stage renal disease) on dialysis ([place])    GI bleed    History of blood transfusion 03/2021   Hypertension    Insomnia    Mitral regurgitation 05/16/2021   Pneumonia    Positive  QuantiFERON-TB Gold test 05/16/2021   Stroke ([place])    TB lung, latent 05/16/2021    Past Surgical History:  Procedure Laterality Date   AV FISTULA PLACEMENT Right 05/14/2021   Procedure: INSERTION OF ARTERIOVENOUS (AV) GORE-TEX GRAFT ARM;  Surgeon: Angelia Mould, MD;  Location: [facility] OR;  Service: Vascular;  Laterality: Right;   ENTEROSCOPY N/A 04/18/2021   Procedure: ENTEROSCOPY;  Surgeon: Jackquline Denmark, MD;  Location: [facility] ENDOSCOPY;  Service: Endoscopy;  Laterality: N/A;   HOT HEMOSTASIS N/A 04/18/2021   Procedure: HOT HEMOSTASIS (ARGON PLASMA COAGULATION/BICAP);  Surgeon: Jackquline Denmark, MD;  Location: [facility] ENDOSCOPY;  Service: Endoscopy;  Laterality: N/A;   SUBMUCOSAL TATTOO INJECTION  04/18/2021   Procedure: SUBMUCOSAL TATTOO INJECTION;  Surgeon: Jackquline Denmark, MD;  Location: [facility] ENDOSCOPY;  Service: Endoscopy;;    Family History  Problem Relation Age of Onset   Hypertension Mother    Hypertension Father     Social History:  reports that he has been smoking cigarettes. He has a 15.25 pack-year smoking history. He has never used smokeless tobacco. He reports that he does not currently use alcohol. He reports that he does not currently use drugs.  Allergies: No Known Allergies  No current facility-administered medications on file prior to encounter.  Current Outpatient Medications on File Prior to Encounter  Medication Sig Dispense Refill   ADVAIR HFA 45-21 MCG/ACT inhaler INHALE 2 PUFFS INTO THE LUNGS IN THE MORNING AND AT BEDTIME (Patient not taking: Reported on 08/17/2021) 36 each 2   albuterol (PROVENTIL HFA) 108 (90 Base) MCG/ACT inhaler Inhale 2 puffs into the lungs every 4 (four) hours as needed for wheezing or shortness of breath. 18 g 0   ASPIRIN LOW DOSE 81 MG chewable tablet Chew 81 mg by mouth daily.     atorvastatin (LIPITOR) 20 MG tablet TAKE 1 TABLET BY MOUTH EVERYDAY AT BEDTIME (Patient taking differently: Take 20 mg by mouth daily.) 90 tablet 1    budesonide-formoterol (SYMBICORT) 160-4.5 MCG/ACT inhaler Inhale 2 puffs into the lungs daily.     calcium acetate (PHOSLO) 667 MG capsule Take 667 mg by mouth 3 (three) times daily with meals.     carboxymethylcellulose (REFRESH PLUS) 0.5 % SOLN Place 1 drop into both eyes daily as needed (dry eyes).     carvedilol (COREG) 6.25 MG tablet Take 1 tablet (6.25 mg total) by mouth 2 (two) times daily with a meal. 60 tablet 0   hydrALAZINE (APRESOLINE) 50 MG tablet Take 50 mg by mouth every 8 (eight) hours.     isoniazid (NYDRAZID) 300 MG tablet TAKE 1 TABLET BY MOUTH EVERY DAY (Patient not taking: Reported on 09/09/2021) 90 tablet 3   isosorbide mononitrate (IMDUR) 30 MG 24 hr tablet Take 30 mg by mouth daily.     lidocaine-prilocaine (EMLA) cream Apply 1 application topically daily as needed (port access).     memantine (NAMENDA) 5 MG tablet Take 5 mg by mouth daily.     nicotine (NICODERM CQ - DOSED IN MG/24 HOURS) 14 mg/24hr patch Place 1 patch (14 mg total) onto the skin daily. (Patient not taking: Reported on 08/17/2021) 90 patch 1   oxyCODONE-acetaminophen (PERCOCET) 10-325 MG tablet Take 1 tablet by mouth every 6 (six) hours as needed for pain. (Patient not taking: Reported on 08/17/2021) 20 tablet 0   pantoprazole (PROTONIX) 40 MG tablet Take 1 tablet (40 mg total) by mouth daily.     pyridOXINE (VITAMIN B-6) 50 MG tablet Take 1 tablet (50 mg total) by mouth daily. 30 tablet 8   Tiotropium Bromide Monohydrate (SPIRIVA RESPIMAT) 2.5 MCG/ACT AERS Inhale 2 puffs into the lungs daily. 4 g 3   traZODone (DESYREL) 50 MG tablet Take 1-2 tablets (50-100 mg total) by mouth at bedtime as needed. (Patient taking differently: Take 50-100 mg by mouth at bedtime as needed for sleep.) 45 tablet 1    Review of Systems: A full ROS was attempted today and was not able to be performed given AMS and poor historian.   Physical Examination: Temp:  [97.5 F (36.4 C)] 97.5 F (36.4 C) (01/09 1722) Pulse Rate:   [65] 65 (01/09 1715) Resp:  [23] 23 (01/09 1715) BP: (126-135)/(40-76) 126/76 (01/09 1715) SpO2:  [96 %] 96 % (01/09 1715) Weight:  [92 kg] 67 kg (01/09 1657)  General - well nourished, well developed, in no apparent distress.    Ophthalmologic - fundi not visualized due to noncooperation.    Cardiovascular - regular rhythm and rate, not in afib  Neuro - awake, alert, eyes open, orientated to age (with the 2nd attempt), place, month but not to year. No aphasia but paucity of speech, following all simple commands. Able to name and repeat. No gaze palsy, tracking bilaterally, blinking to visual threat bilaterally, PERRL.  No facial droop. Tongue midline. Bilateral UEs 5/5, no drift. Initially b/l LE drift to bed before 5 sec but then after CT, he was able to hold RLE no drift but LLE drift to bed around 5 secs, he complained left leg pain that made him not able to hold. On sensation exam, not cooperative, perseverated on "right" or "left", even with watching me to touch his left arm, he still perseverated on saying right. FTN grossly intact, gait not tested.   NIH Stroke Scale  Level Of Consciousness 0=Alert; keenly responsive 1=Arouse to minor stimulation 2=Requires repeated stimulation to arouse or movements to pain 3=postures or unresponsive 0  LOC Questions to Month and Age 68=Answers both questions correctly 1=Answers one question correctly or dysarthria/intubated/trauma/language barrier 2=Answers neither question correctly or aphasia 0  LOC Commands      -Open/Close eyes     -Open/close grip     -Pantomime commands if communication barrier 0=Performs both tasks correctly 1=Performs one task correctly 2=Performs neighter task correctly 0  Best Gaze     -Only assess horizontal gaze 0=Normal 1=Partial gaze palsy 2=Forced deviation, or total gaze paresis 0  Visual 0=No visual loss 1=Partial hemianopia 2=Complete hemianopia 3=Bilateral hemianopia (blind including cortical blindness)  0  Facial Palsy     -Use grimace if obtunded 0=Normal symmetrical movement 1=Minor paralysis (asymmetry) 2=Partial paralysis (lower face) 3=Complete paralysis (upper and lower face) 0  Motor  0=No drift for 10/5 seconds 1=Drift, but does not hit bed 2=Some antigravity effort, hits  bed 3=No effort against gravity, limb falls 4=No movement 0=Amputation/joint fusion Right Arm 0     Leg 0    Left Arm 0     Leg 1 (due to left leg pain)  Limb Ataxia     - FNT/HTS 0=Absent or does not understand or paralyzed or amputation/joint fusion 1=Present in one limb 2=Present in two limbs 0  Sensory 0=Normal 1=Mild to moderate sensory loss 2=Severe to total sensory loss or coma/unresponsive 1 (not cooperative on testing, but withdraw to pain on extremities)  Best Language 0=No aphasia, normal 1=Mild to moderate aphasia 2=Severe aphasia 3=Mute, global aphasia, or coma/unresponsive 0  Dysarthria 0=Normal 1=Mild to moderate 2=Severe, unintelligible or mute/anarthric 0=intubated/unable to test 1  Extinction/Neglect 0=No abnormality 1=visual/tactile/auditory/spatia/personal inattention/Extinction to bilateral simultaneous stimulation 2=Profound neglect/extinction more than 1 modality  1 (however, not cooperative with testing, perseverated on "right" or "left", even with watching me to touch his left arm, he still perseverated on saying right)  Total   4      Data Reviewed: DG Chest 2 View  Result Date: 09/09/2021 CLINICAL DATA:  Chest pain EXAM: CHEST - 2 VIEW COMPARISON:  08/23/2021 FINDINGS: Right CVC in unchanged position. Cardiac and mediastinal contours are within normal limits. Aortic atherosclerosis. No focal pulmonary opacity. No pleural effusion or pneumothorax. No acute osseous abnormality. IMPRESSION: No acute cardiopulmonary process. Electronically Signed   By: Merilyn Baba M.D.   On: 09/09/2021 01:36   CT HEAD CODE STROKE WO CONTRAST  Result Date: 09/30/2021 CLINICAL DATA:   Code stroke.  Confusion, acute neuro deficit EXAM: CT HEAD WITHOUT CONTRAST TECHNIQUE: Contiguous axial images were obtained from the base of the skull through the vertex without intravenous contrast. COMPARISON:  None. FINDINGS: Brain: Hypodensities in the anterior right basal ganglia, which are favored to be chronic. No evidence of acute cortical infarction, hemorrhage, cerebral edema, mass, mass effect, or midline shift. Ventricles and sulci are normal for age. No extra-axial fluid collection. Periventricular white matter  changes, likely the sequela of chronic small vessel ischemic disease. Vascular: No hyperdense vessel or unexpected calcification. Atherosclerotic calcifications in the intracranial carotid and vertebral arteries. Skull: Normal. Negative for fracture or focal lesion. Sinuses/Orbits: No acute finding. Other: The mastoid air cells are well aerated. ASPECTS [facility] Stroke Program Early CT Score) - Ganglionic level infarction (caudate, lentiform nuclei, internal capsule, insula, M1-M3 cortex): 7 - Supraganglionic infarction (M4-M6 cortex): 3 Total score (0-10 with 10 being normal): 10 IMPRESSION: 1. Hypodensity in the anterior right basal ganglia, favored to be chronic infarcts. 2. ASPECTS is 10 Code stroke imaging results were communicated on 09/30/2021 at 5:08 pm to provider Dr. Erlinda Hong via secure text paging. Electronically Signed   By: Merilyn Baba M.D.   On: 09/30/2021 17:08    Assessment: 81 y.o. 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 He did have soft BPs during HD and removed 1.2L fluid. LSW 1545. On exam, not orientated to year, fluctuating mental status, not cooperative with sensory exam, left leg pain with drift. NIHSS = 4 but no clear focal deficit. CT no acute finding but old right  caudate head infarct. Pt not TNK candidate due to no clear focal deficit and likely not stroke, more concerning for encephalopathy post HD. However, he does have multiple stroke risk factors, will recommend MRI and MRA, and regular stroke work up.   Stroke Risk Factors - atrial fibrillation, hyperlipidemia, hypertension, and smoking  Plan: Continue further stroke and encephalopathy work up  Frequent neuro checks Telemetry monitoring MRI brain and MRA head with carotid doppler Echocardiogram  UDS, fasting lipid panel and HgbA1C PT/OT/speech consult Permissive hypertension (only treat if BP > 180/105 given ESRD) for 24-48 hours post stroke onset. Avoid low BP Avoid hypoglycemia.  GI and DVT prophylaxis  ASA 300 PR once Discussed with Dr. Ashok Cordia ED physician We will follow   Thank you for this consultation and allowing [place] to participate in the care of this patient.  Rosalin Hawking, MD PhD Stroke Neurology 09/30/2021 6:32 PM

## 2021-09-30 NOTE — ED Notes (Signed)
Pt back from MRI 

## 2021-09-30 NOTE — ED Notes (Signed)
Patient transported to MRI 

## 2021-10-10 ENCOUNTER — Telehealth: Payer: Self-pay | Admitting: *Deleted

## 2021-10-10 ENCOUNTER — Ambulatory Visit (INDEPENDENT_AMBULATORY_CARE_PROVIDER_SITE_OTHER): Payer: Medicare Other | Admitting: Internal Medicine

## 2021-10-10 VITALS — BP 140/62 | HR 60 | Temp 97.8°F | Wt 154.9 lb

## 2021-10-10 DIAGNOSIS — E785 Hyperlipidemia, unspecified: Secondary | ICD-10-CM

## 2021-10-10 DIAGNOSIS — F0390 Unspecified dementia without behavioral disturbance: Secondary | ICD-10-CM

## 2021-10-10 DIAGNOSIS — Z227 Latent tuberculosis: Secondary | ICD-10-CM

## 2021-10-10 DIAGNOSIS — I2510 Atherosclerotic heart disease of native coronary artery without angina pectoris: Secondary | ICD-10-CM

## 2021-10-10 DIAGNOSIS — I480 Paroxysmal atrial fibrillation: Secondary | ICD-10-CM

## 2021-10-10 DIAGNOSIS — J42 Unspecified chronic bronchitis: Secondary | ICD-10-CM

## 2021-10-10 DIAGNOSIS — I10 Essential (primary) hypertension: Secondary | ICD-10-CM

## 2021-10-10 DIAGNOSIS — Z992 Dependence on renal dialysis: Secondary | ICD-10-CM

## 2021-10-10 DIAGNOSIS — R627 Adult failure to thrive: Secondary | ICD-10-CM

## 2021-10-10 DIAGNOSIS — N186 End stage renal disease: Secondary | ICD-10-CM
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
## 2021-10-11 ENCOUNTER — Telehealth: Payer: Self-pay | Admitting: *Deleted

## 2021-10-11 NOTE — Chronic Care Management (AMB) (Signed)
°  Chronic Care Management   Outreach Note  10/11/2021 Name: Anthony [name] MRN: [id number] DOB: April 07, 1941  Anthony [name] is a 81 y.o. year old male who is a primary care patient of Isaac Bliss, Rayford Halsted, MD. I reached out to Anthony [name] by phone today in response to a referral sent by Anthony [name] primary care provider.  An unsuccessful telephone outreach was attempted today. The patient was referred to the case management team for assistance with care management and care coordination.   Follow Up Plan: A HIPAA compliant phone message was left for the patient providing contact information and requesting a return call.  The care management team will reach out to the patient again over the next 7 days.  If patient returns call to provider office, please advise to call [facility]* at [phone].*  [facility]  Direct Dial: [phone]

## 2021-10-12 ENCOUNTER — Other Ambulatory Visit: Payer: Self-pay

## 2021-10-12 ENCOUNTER — Emergency Department (HOSPITAL_COMMUNITY)
Admission: EM | Admit: 2021-10-12 | Discharge: 2021-10-12 | Disposition: A | Discharge status: Home / Self Care | Payer: Medicare Other | Attending: Emergency Medicine | Admitting: Emergency Medicine

## 2021-10-12 ENCOUNTER — Emergency Department (HOSPITAL_COMMUNITY): Payer: Medicare Other

## 2021-10-12 ENCOUNTER — Encounter (HOSPITAL_COMMUNITY): Payer: Self-pay

## 2021-10-12 DIAGNOSIS — I2510 Atherosclerotic heart disease of native coronary artery without angina pectoris: Secondary | ICD-10-CM | POA: Insufficient documentation

## 2021-10-12 DIAGNOSIS — S1093XA Contusion of unspecified part of neck, initial encounter: Secondary | ICD-10-CM | POA: Insufficient documentation

## 2021-10-12 DIAGNOSIS — J449 Chronic obstructive pulmonary disease, unspecified: Secondary | ICD-10-CM | POA: Insufficient documentation

## 2021-10-12 DIAGNOSIS — N186 End stage renal disease: Secondary | ICD-10-CM | POA: Insufficient documentation

## 2021-10-12 DIAGNOSIS — Z992 Dependence on renal dialysis: Secondary | ICD-10-CM | POA: Diagnosis not present

## 2021-10-12 DIAGNOSIS — F0390 Unspecified dementia without behavioral disturbance: Secondary | ICD-10-CM | POA: Diagnosis not present

## 2021-10-12 DIAGNOSIS — Z7951 Long term (current) use of inhaled steroids: Secondary | ICD-10-CM | POA: Diagnosis not present

## 2021-10-12 DIAGNOSIS — R55 Syncope and collapse: Secondary | ICD-10-CM | POA: Diagnosis not present

## 2021-10-12 DIAGNOSIS — T07XXXA Unspecified multiple injuries, initial encounter: Secondary | ICD-10-CM

## 2021-10-12 DIAGNOSIS — Z23 Encounter for immunization: Secondary | ICD-10-CM | POA: Diagnosis not present

## 2021-10-12 DIAGNOSIS — I5032 Chronic diastolic (congestive) heart failure: Secondary | ICD-10-CM | POA: Diagnosis not present

## 2021-10-12 DIAGNOSIS — S0592XA Unspecified injury of left eye and orbit, initial encounter: Secondary | ICD-10-CM

## 2021-10-12 DIAGNOSIS — Z7982 Long term (current) use of aspirin: Secondary | ICD-10-CM | POA: Diagnosis not present

## 2021-10-12 DIAGNOSIS — S0232XA Fracture of orbital floor, left side, initial encounter for closed fracture: Secondary | ICD-10-CM | POA: Insufficient documentation

## 2021-10-12 DIAGNOSIS — W109XXA Fall (on) (from) unspecified stairs and steps, initial encounter: Secondary | ICD-10-CM | POA: Insufficient documentation

## 2021-10-12 DIAGNOSIS — S0001XA Abrasion of scalp, initial encounter: Secondary | ICD-10-CM | POA: Diagnosis not present

## 2021-10-12 LAB — CBC WITH DIFFERENTIAL/PLATELET
Abs Immature Granulocytes: 0.04 10*3/uL (ref 0.00–0.07)
Basophils Absolute: 0 10*3/uL (ref 0.0–0.1)
Basophils Relative: 1 %
Eosinophils Absolute: 0.1 10*3/uL (ref 0.0–0.5)
Eosinophils Relative: 1 %
HCT: 34.8 % — ABNORMAL LOW (ref 39.0–52.0)
Hemoglobin: 10.9 g/dL — ABNORMAL LOW (ref 13.0–17.0)
Immature Granulocytes: 1 %
Lymphocytes Relative: 15 %
Lymphs Abs: 1 10*3/uL (ref 0.7–4.0)
MCH: 28.8 pg (ref 26.0–34.0)
MCHC: 31.3 g/dL (ref 30.0–36.0)
MCV: 91.8 fL (ref 80.0–100.0)
Monocytes Absolute: 0.4 10*3/uL (ref 0.1–1.0)
Monocytes Relative: 7 %
Neutro Abs: 4.9 10*3/uL (ref 1.7–7.7)
Neutrophils Relative %: 75 %
Platelets: 236 10*3/uL (ref 150–400)
RBC: 3.79 MIL/uL — ABNORMAL LOW (ref 4.22–5.81)
RDW: 21.7 % — ABNORMAL HIGH (ref 11.5–15.5)
WBC: 6.5 10*3/uL (ref 4.0–10.5)
nRBC: 0.3 % — ABNORMAL HIGH (ref 0.0–0.2)

## 2021-10-12 LAB — BASIC METABOLIC PANEL
Anion gap: 10 (ref 5–15)
BUN: 59 mg/dL — ABNORMAL HIGH (ref 8–23)
CO2: 30 mmol/L (ref 22–32)
Calcium: 9.3 mg/dL (ref 8.9–10.3)
Chloride: 101 mmol/L (ref 98–111)
Creatinine, Ser: 11.41 mg/dL — ABNORMAL HIGH (ref 0.61–1.24)
GFR, Estimated: 4 mL/min — ABNORMAL LOW (ref >60)
Glucose, Bld: 132 mg/dL — ABNORMAL HIGH (ref 70–99)
Potassium: 4.7 mmol/L (ref 3.5–5.1)
Sodium: 141 mmol/L (ref 135–145)

## 2021-10-12 LAB — PROTIME-INR
INR: 1.2 (ref 0.8–1.2)
Prothrombin Time: 14.7 seconds (ref 11.4–15.2)

## 2021-10-12 LAB — APTT: aPTT: 34 seconds (ref 24–36)

## 2021-10-12 MED ORDER — MORPHINE SULFATE (PF) 4 MG/ML IV SOLN
4.0000 mg | Freq: Once | INTRAVENOUS | Status: DC
Start: 2021-10-12 — End: 2021-10-12
  Filled 2021-10-12: qty 1

## 2021-10-12 MED ORDER — TETANUS-DIPHTH-ACELL PERTUSSIS 5-2.5-18.5 LF-MCG/0.5 IM SUSY
0.5000 mL | PREFILLED_SYRINGE | Freq: Once | INTRAMUSCULAR | Status: AC
Start: 2021-10-12 — End: 2021-10-12
  Administered 2021-10-12: 0.5 mL via INTRAMUSCULAR
  Filled 2021-10-12: qty 0.5

## 2021-10-12 MED ORDER — BACITRACIN ZINC 500 UNIT/GM EX OINT: TOPICAL_OINTMENT | Freq: Two times a day (BID) | CUTANEOUS | Status: DC

## 2021-10-12 MED ORDER — MORPHINE SULFATE (PF) 4 MG/ML IV SOLN
4.0000 mg | Freq: Once | INTRAVENOUS | Status: AC
  Administered 2021-10-12: 4 mg via INTRAMUSCULAR

## 2021-10-12 NOTE — ED Notes (Signed)
Pts lt face cleaned with soap and water  bacitracin oint placed over the abrasions lt face and lt eyelids  the lt eye appears to be more open than at 1530  waiting for the wife to come take the pt to the eye doctors office across the street  pt dressed and waiting on a ride

## 2021-10-12 NOTE — ED Notes (Signed)
Pt returned from c-t c/o hurting all over massive abrasion to thr lt side of his face andc  lt eyelids  swollen together saline gauze to those areas  wife here briefly then had to leave

## 2021-10-12 NOTE — ED Provider Notes (Signed)
[facility] EMERGENCY DEPARTMENT Provider Note   CSN: [id number] Arrival date & time: 10/12/21  1312     History  Chief Complaint  Patient presents with   Fall   Facial Abrasion    Anthony [name] is a 81 y.o. male with history of ESRD on dialysis MWF, chronic diastolic heart failure, paroxysmal atrial fibrillation, COPD, CAD, hyperlipidemia, dementia, latent TB who presents to the ED via EMS from home after falling down the stairs.  Patient states that he tripped fell forward and 7 steps landing face first onto the concrete.  He endorses loss of consciousness although he does not know for how long.  Patient is in c-collar on arrival to the ED.  He is complaining of pain of his face and particularly his neck.  He does not miss dialysis and had his last appointment yesterday.  He is not anticoagulated.  Denies pain or numbness/tingling to the lower extremities and upper extremities.  He denies N/V/D, fevers, saddle paresthesia, bladder and bowel dysfunction.   Fall Pertinent negatives include no abdominal pain, no headaches and no shortness of breath.      Home Medications Prior to Admission medications   Medication Sig Start Date End Date Taking? Authorizing Provider  ADVAIR HFA [phone] MCG/ACT inhaler INHALE 2 PUFFS INTO THE LUNGS IN THE MORNING AND AT BEDTIME 01/17/21   Isaac Bliss, Rayford Halsted, MD  albuterol (PROVENTIL HFA) 108 (90 Base) MCG/ACT inhaler Inhale 2 puffs into the lungs every 4 (four) hours as needed for wheezing or shortness of breath. 03/08/20   Laurey Morale, MD  ASPIRIN LOW DOSE 81 MG chewable tablet Chew 81 mg by mouth daily. 03/05/21   [provider]  atorvastatin (LIPITOR) 20 MG tablet TAKE 1 TABLET BY MOUTH EVERYDAY AT BEDTIME Patient taking differently: Take 20 mg by mouth daily. 07/30/20   Isaac Bliss, Rayford Halsted, MD  budesonide-formoterol [facility]) 160-4.5 MCG/ACT inhaler Inhale 2 puffs into the lungs daily. 04/12/21   [provider]  calcium acetate (PHOSLO) 667 MG capsule Take 667 mg by mouth 3 (three) times daily with meals.    [provider]  carboxymethylcellulose (REFRESH PLUS) 0.5 % SOLN Place 1 drop into both eyes daily as needed (dry eyes). 12/07/20   [provider]  carvedilol (COREG) 6.25 MG tablet Take 1 tablet (6.25 mg total) by mouth 2 (two) times daily with a meal. 03/02/20   Black, Lezlie Octave, NP  hydrALAZINE (APRESOLINE) 50 MG tablet Take 50 mg by mouth every 8 (eight) hours.    [provider]  isoniazid (NYDRAZID) 300 MG tablet TAKE 1 TABLET BY MOUTH EVERY DAY 07/26/21   Tommy Medal, Lavell Islam, MD  isosorbide mononitrate (IMDUR) 30 MG 24 hr tablet Take 30 mg by mouth daily. 04/02/21   [provider]  lidocaine-prilocaine (EMLA) cream Apply 1 application topically daily as needed (port access).    [provider]  memantine (NAMENDA) 5 MG tablet Take 5 mg by mouth daily. 03/12/21   [provider]  nicotine (NICODERM CQ - DOSED IN MG/24 HOURS) 14 mg/24hr patch Place 1 patch (14 mg total) onto the skin daily. 03/15/20   Isaac Bliss, Rayford Halsted, MD  oxyCODONE-acetaminophen (PERCOCET) 10-325 MG tablet Take 1 tablet by mouth every 6 (six) hours as needed for pain. 05/14/21 05/14/22  Setzer, Edman Circle, PA-C  pantoprazole (PROTONIX) 40 MG tablet Take 1 tablet (40 mg total) by mouth daily. 04/24/21   Mariel Aloe, MD  pyridOXINE (VITAMIN B-6) 50 MG tablet Take 1 tablet (50 mg total) by mouth daily. 05/16/21 02/10/22  Truman Hayward, MD  Tiotropium Bromide Monohydrate (SPIRIVA RESPIMAT) 2.5 MCG/ACT AERS Inhale 2 puffs into the lungs daily. 03/15/20   Isaac Bliss, Rayford Halsted, MD  traZODone (DESYREL) 50 MG tablet Take 1-2 tablets (50-100 mg total) by mouth at bedtime as needed. Patient taking differently: Take 50-100 mg by mouth at bedtime as needed for sleep. 06/12/21   Isaac Bliss, Rayford Halsted, MD      Allergies    Patient has no known allergies.     Review of Systems   Review of Systems  Constitutional:  Negative for fever.  HENT: Negative.    Eyes: Negative.   Respiratory:  Negative for shortness of breath.   Cardiovascular: Negative.   Gastrointestinal:  Negative for abdominal pain and vomiting.  Endocrine: Negative.   Genitourinary: Negative.   Musculoskeletal: Negative.   Skin:  Negative for rash.  Neurological:  Negative for headaches.  All other systems reviewed and are negative.  Physical Exam Updated Vital Signs BP (!) 167/70    Pulse (!) 50    Temp 98.7 F (37.1 C) (Oral)    Resp 14    SpO2 99%  Physical Exam Vitals and nursing note reviewed.  Constitutional:      General: He is not in acute distress.    Appearance: Normal appearance. He is not ill-appearing.     Comments: Well appearing, no distress  HENT:     Head:     Comments: Left face and scalp with numerous abrasions.  Significant left periorbital swelling.  Patient is unable to fully open his left eye, however the small amount that I can open it manually there is no evidence of hyphema.    Nose: Nose normal.     Mouth/Throat:     Mouth: Mucous membranes are moist.     Comments: Uvula is midline, oropharynx is clear and moist and mucous membranes are normal.  Eyes:     Extraocular Movements: Extraocular movements intact.     Conjunctiva/sclera: Conjunctivae normal.     Pupils: Pupils are equal, round, and reactive to light.     Comments: Conjunctivae and EOM are normal. Pupils are equal, round, and reactive to light.   Neck:     Comments: Patient in C-spine collar at time of evaluation complaining of neck pain. Cardiovascular:     Rate and Rhythm: Normal rate and regular rhythm.     Comments: Normal rate, regular rhythm and intact distal pulses.   Radial pulses are 2+ on the right side, and 2+ on the left side.       Dorsalis pedis pulses are 2+ on the right side, and 2+ on the left side.       Posterior tibial pulses are 2+ on the right side, and  2+ on the left side.  Pulmonary:     Effort: Pulmonary effort is normal.     Breath sounds: Normal breath sounds.     Comments: Effort normal and breath sounds normal. No accessory muscle usage. No respiratory distress. No decreased breath sounds.  There are mild expiratory wheezes. No rhonchi. No rales.  He does have tenderness to palpation of the bilateral clavicles.  No flail segment, crepitus or deformity Equal chest expansion  Abdominal:     Comments: Abd soft and nontender. Normal appearance and bowel sounds are normal. There is no rigidity, no guarding and no CVA tenderness.  Musculoskeletal:        General: Normal range of motion.     Cervical back: Normal range of motion.     Comments: No tenderness to palpation of the spinous processes or paraspinous muscles of the T-spine or L-spine No crepitus, deformity or step-offs. Patient has a functioning port in the right upper chest for his dialysis   Skin:    General: Skin is warm and dry.     Capillary Refill: Capillary refill takes less than 2 seconds.     Comments: Skin is warm and dry. No rash noted. Pt is not diaphoretic. No erythema.   Neurological:     General: No focal deficit present.     Mental Status: He is alert and oriented to person, place, and time.     Cranial Nerves: No cranial nerve deficit.     Comments: Speech is clear and goal oriented, follows commands Normal 5/5 strength in upper and lower extremities bilaterally including dorsiflexion and plantar flexion, strong and equal grip strength Sensation intact to light and sharp touch Moves extremities without ataxia, coordination intact. Good finger to nose No Clonus   Psychiatric:        Mood and Affect: Mood normal.        Behavior: Behavior normal.     ED Results / Procedures / Treatments   Labs (all labs ordered are listed, but only abnormal results are displayed) Labs Reviewed - No data to display  EKG None  Radiology No results  found.  Procedures Procedures    Medications Ordered in ED Medications - No data to display  ED Course/ Medical Decision Making/ A&P Clinical Course as of 10/15/21 1042  Sat Oct 12, 2021  1559 APTT Bleeding time and INR are normal [EC]  2952 Basic metabolic panel(!) BMP with elevated creatinine 11.41, however he seems to bounce between 5 and 12 within the last month. [EC]  1559 CBC with Differential(!) Hemoglobin slightly decreased at 10.9 which is new for him. [EC]  1600 CT Cervical Spine Wo Contrast Cervical spine negative for fractures [EC]  1600 CT Head Wo Contrast Head without intracranial bleed [EC]  1600 DG Clavicle Left Clavicle x-rays negative for fracture dislocation [EC]  1600 CT Maxillofacial Wo Contrast CT of the face shows fracture of the orbital floor without rectus herniation, though there is blood found in the left maxillary sinus.  Dr. Eulis Foster accompanied me for reexamination of the eye.  EOM muscles are intact, there does not appear to be inferior rectus entrapment.  However it does seem that there may be a scleral tear of the lateral portion of his left eye.  Consult to ophthalmology placed [EC]  1800 Patient's wife is now here to pick him up and take him to the ophthalmology office for further care and treatment.  His wounds have been cleansed. [EW]    Clinical Course User Index [EC] Tonye Pearson, PA-C [EW] Daleen Bo, MD                           Medical Decision Making Amount and/or Complexity of Data Reviewed Labs: ordered. Decision-making details documented in ED Course. Radiology: ordered. Decision-making details documented in ED Course.  Risk Prescription drug management.   History:  Cyan Moultrie is a 81 y.o. male with history of ESRD on dialysis MWF, chronic diastolic heart failure, paroxysmal atrial fibrillation, COPD, CAD, hyperlipidemia, dementia, latent TB who presents to the ED via EMS from  home after falling down the stairs.   Patient states that he tripped fell forward and 7 steps landing face first onto the concrete.  He endorses loss of consciousness although he does not know for how long.  Patient is in c-collar on arrival to the ED.  He is complaining of pain of his face and particularly his neck.  He does not miss dialysis and had his last appointment yesterday.  He is not anticoagulated.  Denies pain or numbness/tingling to the lower extremities and upper extremities.  He denies N/V/D, fevers, saddle paresthesia, bladder and bowel dysfunction. Additional history obtained from EMS This patient presents to the ED for concern of head trauma, this involves an extensive number of treatment options, and is a complaint that carries with it a high risk of complications and morbidity.   The ddx includes intracranial hemorrhage, facial trauma, orbital fracture, skull fracture, concussion, c-spine fracture  Initial impression:  Patient is lying on bed in obvious discomfort.  He is in C-spine collar from EMS and complaining of neck pain, so we will hold off full examination of the neck until his C-spine CT returns.  He has significant right periorbital swelling with numerous left-sided facial scalp abrasions.  We will proceed with CT head, cervical spine, maxillofacial.  We will also get x-rays of the clavicles and obtain basic labs.  Pain medication ordered.  Lab Tests and EKG:  I Ordered, reviewed, and interpreted labs and EKG.  The pertinent results in my decision-making regarding them are detailed in the ED course and/or initial impression section above.   Imaging Studies ordered:  I ordered imaging studies including CT head, c-spine, maxillofacial and clavicle xrays  I independently visualized and interpreted imaging and I agree with the radiologist interpretation. Decisions made regarding results are detailed in the ED course and/or initial impression section above.   Cardiac Monitoring:  The patient was maintained  on a cardiac monitor.  I personally viewed and interpreted the cardiac monitored which showed an underlying rhythm of: NSR   Medicines ordered and prescription drug management:  I ordered medication including: Morphine  for pain Tdap for tetanus prevention  Reevaluation of the patient after these medicines showed that the patient improved I have reviewed the patients home medicines and have made adjustments as needed   Disposition:  Care handed off to Christ Kick at shift change who will place ophthalmology consult and determine ultimate treatment and disposition.   Final Clinical Impression(s) / ED Diagnoses Final diagnoses:  Contusion, multiple sites  Abrasion, multiple sites  Left eye injury, initial encounter  Closed fracture of left orbital floor, initial encounter [facility])    Rx / [place] Orders ED Discharge Orders     None         Tonye Pearson, PA-C 10/15/21 1048    Daleen Bo, MD 10/17/21 [phone]

## 2021-10-12 NOTE — ED Provider Notes (Signed)
°  Face-to-face evaluation   History: Patient presenting for evaluation of fall down steps.  He states he was trying to take a walk and was off balance and fell.  Is not clear if he got up and walked afterwards.  He presents by EMS.  He denies chest abdominal or back pain.  He denies extremity pain.  Physical exam: Elderly appearing alert and cooperative.  Moderate facial swelling left-sided with abrasions.  Left eye swollen shut.  MDM: Evaluation for  Chief Complaint  Patient presents with   Fall   Facial Abrasion     Patient presenting with fall, apparently mechanically related to balance problems.  He dialyzes regularly.  He does not take anticoagulants.  He has verbal, and does not seem altered.  He requires advanced imaging to rule out serious problems including intracranial injury, facial injury and cervical spine injury.  No apparent trauma to torso or extremities.  I do not believe the patient requires antibiotic treatment at this time, for orbital floor fracture, or facial injuries.  Eye care to be managed by the ophthalmologist, at an office visit later this evening.   Clinical Course as of 10/12/21 1801  Sat Oct 12, 2021  1559 APTT Bleeding time and INR are normal [EC]  5053 Basic metabolic panel(!) BMP with elevated creatinine 11.41, however he seems to bounce between 5 and 12 within the last month. [EC]  1559 CBC with Differential(!) Hemoglobin slightly decreased at 10.9 which is new for him. [EC]  1600 CT Cervical Spine Wo Contrast Cervical spine negative for fractures [EC]  1600 CT Head Wo Contrast Head without intracranial bleed [EC]  1600 DG Clavicle Left Clavicle x-rays negative for fracture dislocation [EC]  1600 CT Maxillofacial Wo Contrast CT of the face shows fracture of the orbital floor without rectus herniation, though there is blood found in the left maxillary sinus.  Dr. Eulis Foster accompanied me for reexamination of the eye.  EOM muscles are intact, there does  not appear to be inferior rectus entrapment.  However it does seem that there may be a scleral tear of the lateral portion of his left eye.  Consult to ophthalmology placed [EC]  1800 Patient's wife is now here to pick him up and take him to the ophthalmology office for further care and treatment.  His wounds have been cleansed. [EW]    Clinical Course User Index [EC] Tonye Pearson, PA-C [EW] Daleen Bo, MD     Medical screening examination/treatment/procedure(s) were conducted as a shared visit with non-physician practitioner(s) and myself.  I personally evaluated the patient during the encounter    Daleen Bo, MD 10/17/21 [phone]

## 2021-10-12 NOTE — ED Triage Notes (Signed)
Pt BIB [facility] form home d/t falling down approx. 7 steps & landing on concrete resulting in Left sided facial abrasion & a brief moment in LOC. Upon arrival to ED pt is in c-collar, c/o pain in Left face/neck & shoulder. A/Ox4, Hx of dialysis, chest access, no restricted arms. 150/68, 51 bpm, 20 Resp, 96% RA.

## 2021-10-12 NOTE — ED Notes (Signed)
Patient transported to CT 

## 2021-10-12 NOTE — Discharge Instructions (Signed)
Go to the office of Dr. Talbert Forest, now to be seen at 6:30 PM for further treatment.  For the abrasions on your face clean them well with soap and water daily and put some ointment like Neosporin on them.  Take Tylenol for pain.  There were no serious injuries to your head or neck other than the left eye injury that Dr. Talbert Forest will help you with.

## 2021-10-14 ENCOUNTER — Telehealth: Payer: Self-pay | Admitting: Internal Medicine

## 2021-10-14 NOTE — Telephone Encounter (Signed)
Pt wife call to give dr.Hernandez some information she stated that he never have taken pain medication and the name of the facility is [place] also stated she need Fl -2 form , history of physicals and office notes she also want a call back.

## 2021-10-15 NOTE — Telephone Encounter (Signed)
FL 2 with 10/10/21 office notes faxed to [phone]. Will send copy to scan & mail original to address on file as requested by [facility].

## 2021-10-15 NOTE — Chronic Care Management (AMB) (Signed)
°  Chronic Care Management   Outreach Note  10/15/2021 Name: Anthony [name] MRN: [id number] DOB: September 30, 1940  Anthony [name] is a 81 y.o. year old male who is a primary care patient of Anthony [name], Anthony Halsted, MD. I reached out to Anthony [name] by phone today in response to a referral sent by Anthony [name].  A second unsuccessful telephone outreach was attempted today spouse wants to wait to schedule. The patient was referred to the case management team for assistance with care management and care coordination.   Follow Up Plan: The care management team will reach out to the patient again over the next 7 days. If patient returns call to [name] office, please advise to call [place] at [phone].  Anthony [name] Management  Direct Dial: [phone]

## 2021-10-15 NOTE — Telephone Encounter (Signed)
Wife Pamala Hurry states she would like FL-2 faxed attention to Shirlee Limerick [phone] (fax number at [facility]) along with office notes. She would also like to notify PCP that pt fell over the w/e (as noted in encounters); that pt is NOT currently taking pain medication - will remove medication from pt current medication list.

## 2021-10-16 NOTE — Chronic Care Management (AMB) (Signed)
Chronic Care Management   Note  10/16/2021 Name: Hesston Hitchens MRN: [id number] DOB: 1941/09/22  Alson Mcpheeters is a 81 y.o. year old male who is a primary care patient of Isaac Bliss, Rayford Halsted, MD. I reached out to [name] by phone today in response to a referral sent by Mr. Eddrick Dilone PCP.  [name] was given information about Chronic Care Management services today including:  CCM service includes personalized support from designated clinical staff supervised by his physician, including individualized plan of care and coordination with other care providers 24/7 contact phone numbers for assistance for urgent and routine care needs. Service will only be billed when office clinical staff spend 20 minutes or more in a month to coordinate care. Only one practitioner may furnish and bill the service in a calendar month. The patient may stop CCM services at any time (effective at the end of the month) by phone call to the office staff. The patient is responsible for co-pay (up to 20% after annual deductible is met) if co-pay is required by the individual health plan.   Spouse Trigger Frasier  verbally agreed to assistance and services provided by embedded care coordination/care management team today.  Follow up plan: Telephone appointment with care management team member scheduled for:10/17/21  [place] Management  Direct Dial: [phone]

## 2021-10-17 ENCOUNTER — Ambulatory Visit (INDEPENDENT_AMBULATORY_CARE_PROVIDER_SITE_OTHER): Payer: Medicare Other | Admitting: Licensed Clinical Social Worker

## 2021-10-17 DIAGNOSIS — I2510 Atherosclerotic heart disease of native coronary artery without angina pectoris: Secondary | ICD-10-CM

## 2021-10-17 DIAGNOSIS — J441 Chronic obstructive pulmonary disease with (acute) exacerbation: Secondary | ICD-10-CM

## 2021-10-17 DIAGNOSIS — I10 Essential (primary) hypertension: Secondary | ICD-10-CM

## 2021-10-17 DIAGNOSIS — I5032 Chronic diastolic (congestive) heart failure: Secondary | ICD-10-CM

## 2021-10-17 DIAGNOSIS — F0390 Unspecified dementia without behavioral disturbance: Secondary | ICD-10-CM

## 2021-10-18 NOTE — Patient Instructions (Signed)
Visit Information  Thank you for taking time to visit with me today. Please don't hesitate to contact me if I can be of assistance to you before our next scheduled telephone appointment.  Following are the goals we discussed today:  Patient Goals/Self-Care Activities: Over the next 90 days Attend scheduled appts Utilize healthy coping skills and/or supportive resources discussed Select a facilities from the list provided  Call to check on availability and visit facilities Call me once you have selected a facility and I will assist with sending a copy of the [facility] next appointment is by telephone on 10/24/21 at 2:15 PM  Please call the care guide team at [phone] if you need to cancel or reschedule your appointment.   If you are experiencing a Mental Health or [place] or need someone to talk to, please call the Suicide and Crisis Lifeline: 988 call 911   Following is a copy of your full plan of care:  Care Plan : LCSW Plan of Care  Updates made by Rebekah Chesterfield, LCSW since 10/18/2021 12:00 AM     Problem: Quality of Life (General Plan of Care)      Goal: Quality of Life Maintained   Start Date: 10/17/2021  This Visit's Progress: On track  Priority: High  Note:   Current barriers:   Patient's health has declined and longer able to meet ADL's at home.  Acknowledges deficits with placement process and needs support in order to meet this need Currently unable to  independently self manage needs related to chronic health conditions.  Clinical Goal(s): Over the next 30 to 45 days patient will be placed in a facility, patient and family will work with LCSW, to coordinate needs for long term skilled nursing facility  Placement Clinical Interventions: 1:1 collaboration with primary care provider regarding development and update of comprehensive plan of care as evidenced by provider attestation and co-signature Inter-disciplinary care team collaboration (see  longitudinal plan of care) Assessed needs and reviewed facility placement process; including applying for Medicaid Patient is a veteran and family are interested in resources that may assist. LCSW provided them with contact info for local [place] office. Pt's spouse agreed to follow up to obtain additional information Patient's spouse and patient's brother provided hx during visit. LCSW informed family of completion and fax to [place] at preferred facility Provided a list of facilities and educational information "when a loved one needs Long-term Care" Obtained patient's verbal permission to fax FL2 and supporting document to facilities as well as obtain PASSAR  Initiated process to obtain PASSAR Collaborated with primary care provider. Patient's family requested prescription for shower chair, home ramp, walker, and wheelchair care guide referral Collaborate with identified facilities and assist patient as needed to understand facility selection process Assist patient with faxing FL2 to identified facilities once completed and signed by PCP Active listening / Reflection utilized  Emotional Support Provided Provided psychoeducation for mental health needs  Caregiver stress acknowledged. Family receives support from adult granddaughter, who resides in home  Verbalization of feelings encouraged  Discussed caregiver resources and support Patient Goals/Self-Care Activities: Over the next 90 days Attend scheduled appts Utilize healthy coping skills and/or supportive resources discussed Select a facilities from the list provided  Call to check on availability and visit facilities Call me once you have selected a facility and I will assist with sending a copy of the [facility]        Anthony [name] was given information about Care Management  services by the embedded care coordination team including:  Care Management services include personalized support from designated clinical staff supervised by his physician,  including individualized plan of care and coordination with other care providers 24/7 contact phone numbers for assistance for urgent and routine care needs. The patient may stop CCM services at any time (effective at the end of the month) by phone call to the office staff.  Patient agreed to services and verbal consent obtained.   The patient verbalized understanding of instructions, educational materials, and care plan provided today and declined offer to receive copy of patient instructions, educational materials, and care plan.   Christa See, MSW, [facility] Care Management [email or web address] Phone [phone] 5:17 PM

## 2021-10-18 NOTE — Chronic Care Management (AMB) (Signed)
Chronic Care Management    Clinical Social Work Note  10/18/2021 Name: Anthony [name] MRN: [id number] DOB: 1941-05-20  Anthony [name] is a 81 y.o. year old male who is a primary care patient of Isaac Bliss, Rayford Halsted, MD. The CCM team was consulted to assist the patient with chronic disease management and/or care coordination needs related to: Level of Care Concerns and Caregiver Stress.   Engaged with patient's spouse and brother by telephone for initial visit in response to provider referral for social work chronic care management and care coordination services.   Consent to Services:  The patient was given the following information about Chronic Care Management services today, agreed to services, and gave verbal consent: 1. CCM service includes personalized support from designated clinical staff supervised by the primary care provider, including individualized plan of care and coordination with other care providers 2. 24/7 contact phone numbers for assistance for urgent and routine care needs. 3. Service will only be billed when office clinical staff spend 20 minutes or more in a month to coordinate care. 4. Only one practitioner may furnish and bill the service in a calendar month. 5.The patient may stop CCM services at any time (effective at the end of the month) by phone call to the office staff. 6. The patient will be responsible for cost sharing (co-pay) of up to 20% of the service fee (after annual deductible is met). Patient agreed to services and consent obtained.  Patient agreed to services and consent obtained.   Summary: Assessed patient's previous and current treatment, coping skills, support system and barriers to care. LCSW provided family with contact info for local [place] office. Pt's spouse agreed to follow up to obtain additional information. Patient's family requested prescription for shower chair, home ramp, walker, and wheelchair. Care guide referral placed.  See Care Plan below  for interventions and patient self-care activities.  Recommendation: Patient may benefit from, and is in agreement work with LCSW to address care coordination needs and will continue to work with the clinical team to address health care and disease management related needs.   Follow up Plan: Patient would like continued follow-up from CCM LCSW.  per patient's request will follow up in 10/24/21.  Will call office if needed prior to next encounter.    SDOH (Social Determinants of Health) assessments and interventions performed:    Advanced Directives Status: Not addressed in this encounter.  CCM Care Plan  No Known Allergies  Outpatient Encounter Medications as of 10/17/2021  Medication Sig Note   ADVAIR HFA 45-21 MCG/ACT inhaler INHALE 2 PUFFS INTO THE LUNGS IN THE MORNING AND AT BEDTIME    albuterol (PROVENTIL HFA) 108 (90 Base) MCG/ACT inhaler Inhale 2 puffs into the lungs every 4 (four) hours as needed for wheezing or shortness of breath.    ASPIRIN LOW DOSE 81 MG chewable tablet Chew 81 mg by mouth daily.    atorvastatin (LIPITOR) 20 MG tablet TAKE 1 TABLET BY MOUTH EVERYDAY AT BEDTIME (Patient taking differently: Take 20 mg by mouth daily.)    budesonide-formoterol (SYMBICORT) 160-4.5 MCG/ACT inhaler Inhale 2 puffs into the lungs daily.    calcium acetate (PHOSLO) 667 MG capsule Take 667 mg by mouth 3 (three) times daily with meals. 08/17/2021: Pharmacy record says 2 capsules 3 times daily   carboxymethylcellulose (REFRESH PLUS) 0.5 % SOLN Place 1 drop into both eyes daily as needed (dry eyes).    carvedilol (COREG) 6.25 MG tablet Take 1 tablet (6.25 mg total) by mouth  2 (two) times daily with a meal.    hydrALAZINE (APRESOLINE) 50 MG tablet Take 50 mg by mouth every 8 (eight) hours.    isoniazid (NYDRAZID) 300 MG tablet TAKE 1 TABLET BY MOUTH EVERY DAY    isosorbide mononitrate (IMDUR) 30 MG 24 hr tablet Take 30 mg by mouth daily.    lidocaine-prilocaine (EMLA) cream Apply 1  application topically daily as needed (port access).    memantine (NAMENDA) 5 MG tablet Take 5 mg by mouth daily.    nicotine (NICODERM CQ - DOSED IN MG/24 HOURS) 14 mg/24hr patch Place 1 patch (14 mg total) onto the skin daily.    pantoprazole (PROTONIX) 40 MG tablet Take 1 tablet (40 mg total) by mouth daily.    pyridOXINE (VITAMIN B-6) 50 MG tablet Take 1 tablet (50 mg total) by mouth daily.    Tiotropium Bromide Monohydrate (SPIRIVA RESPIMAT) 2.5 MCG/ACT AERS Inhale 2 puffs into the lungs daily.    traZODone (DESYREL) 50 MG tablet Take 1-2 tablets (50-100 mg total) by mouth at bedtime as needed. (Patient taking differently: Take 50-100 mg by mouth at bedtime as needed for sleep.)    No facility-administered encounter medications on file as of 10/17/2021.    Patient Active Problem List   Diagnosis Date Noted   Positive QuantiFERON-TB Gold test 05/16/2021   TB lung, latent 05/16/2021   Mitral regurgitation 05/16/2021   Malnutrition of moderate degree 04/18/2021   Anemia 04/16/2021   Symptomatic anemia 04/16/2021   COPD (chronic obstructive pulmonary disease) ([place]) 04/16/2021   CAD (coronary artery disease) 04/16/2021   HLD (hyperlipidemia) 04/16/2021   Prolonged QT interval 04/16/2021   Abnormal ECG 04/16/2021   Dementia without behavioral disturbance ([place]) 04/16/2021   COPD with acute exacerbation ([place]) 04/16/2021   Occult blood positive stool 04/16/2021   COPD exacerbation ([place]) 03/01/2020   Tobacco use disorder 03/01/2020   Chronic diastolic heart failure (HCC)    PAF (paroxysmal atrial fibrillation) (HCC)    Anemia of chronic disease 02/29/2020   Hypertensive urgency 02/29/2020   Elevated troponin 02/29/2020   Chest pain 02/29/2020   ESRD (end stage renal disease) on dialysis ([place]) 12/15/2019   HTN (hypertension) 12/15/2019   Insomnia     Conditions to be addressed/monitored: CHF, CAD, COPD, and Dementia; Level of care concerns, Inability to perform ADL's independently,  and Inability to perform IADL's independently  Care Plan : LCSW Plan of Care  Updates made by Rebekah Chesterfield, LCSW since 10/18/2021 12:00 AM     Problem: Quality of Life (General Plan of Care)      Goal: Quality of Life Maintained   Start Date: 10/17/2021  This Visit's Progress: On track  Priority: High  Note:   Current barriers:   Patient's health has declined and longer able to meet ADL's at home.  Acknowledges deficits with placement process and needs support in order to meet this need Currently unable to  independently self manage needs related to chronic health conditions.  Clinical Goal(s): Over the next 30 to 45 days patient will be placed in a facility, patient and family will work with LCSW, to coordinate needs for long term skilled nursing facility  Placement Clinical Interventions: 1:1 collaboration with primary care provider regarding development and update of comprehensive plan of care as evidenced by provider attestation and co-signature Inter-disciplinary care team collaboration (see longitudinal plan of care) Assessed needs and reviewed facility placement process; including applying for Medicaid Patient is a veteran and family are interested in  resources that may assist. LCSW provided them with contact info for local [place] office. Pt's spouse agreed to follow up to obtain additional information Patient's spouse and patient's brother provided hx during visit. LCSW informed family of completion and fax to [place] at preferred facility Provided a list of facilities and educational information "when a loved one needs Long-term Care" Obtained patient's verbal permission to fax FL2 and supporting document to facilities as well as obtain PASSAR  Initiated process to obtain PASSAR Collaborated with primary care provider. Patient's family requested prescription for shower chair, home ramp, walker, and wheelchair care guide referral Collaborate with identified facilities and assist  patient as needed to understand facility selection process Assist patient with faxing FL2 to identified facilities once completed and signed by PCP Active listening / Reflection utilized  Emotional Support Provided Provided psychoeducation for mental health needs  Caregiver stress acknowledged. Family receives support from adult granddaughter, who resides in home  Verbalization of feelings encouraged  Discussed caregiver resources and support Patient Goals/Self-Care Activities: Over the next 90 days Attend scheduled appts Utilize healthy coping skills and/or supportive resources discussed Select a facilities from the list provided  Call to check on availability and visit facilities Call me once you have selected a facility and I will assist with sending a copy of the [place], MSW, [facility] [email or web address] Phone [phone] 5:11 PM

## 2021-10-21 ENCOUNTER — Telehealth: Payer: Self-pay

## 2021-10-21 ENCOUNTER — Encounter: Payer: Self-pay | Admitting: Internal Medicine

## 2021-10-21 NOTE — Telephone Encounter (Signed)
° °  Telephone encounter was:  Successful.  10/21/2021 Name: Anthony [name] MRN: [id number] DOB: 12-Oct-1940  Anthony [name] is a 81 y.o. year old male who is a primary care patient of Isaac Bliss, Rayford Halsted, MD . The community resource team was consulted for assistance with  wheel chair walker and a ramp   Care guide performed the following interventions: Patient provided with information about care guide support team and interviewed to confirm resource needs.Patient and wife stated he needs wheelchair walker and a  wheelchair ramp.  Follow Up Plan:  Care guide will follow up with patient by phone over the next week    [place], Care Management  [phone] [address], [place], [place] [address] Phone: [phone] Email: [email or web address]

## 2021-10-22 ENCOUNTER — Telehealth: Payer: Self-pay

## 2021-10-22 NOTE — Telephone Encounter (Signed)
° °  Telephone encounter was:  Successful.  10/22/2021 Name: Anthony [name] MRN: [id number] DOB: [birth year]  Anthony [name] is a 81 y.o. year old male who is a primary care patient of Isaac Bliss, Rayford Halsted, MD . The community resource team was consulted for assistance with  wheel chair ramp  Care guide performed the following interventions: Patient provided with information about care guide support team and interviewed to confirm resource needs. Called patient and spoke with spouse about [facility]. I gave her the phone number to contact them for more information as well as mailing the resources  Follow Up Plan:  No further follow up planned at this time. The patient has been provided with needed resources.    [place], Care Management  [phone] [address], [place], [place] [address] Phone: [phone] Email: [email or web address]

## 2021-10-24 ENCOUNTER — Ambulatory Visit (INDEPENDENT_AMBULATORY_CARE_PROVIDER_SITE_OTHER): Payer: Medicare Other | Admitting: Licensed Clinical Social Worker

## 2021-10-24 DIAGNOSIS — I5032 Chronic diastolic (congestive) heart failure: Secondary | ICD-10-CM

## 2021-10-24 DIAGNOSIS — F0390 Unspecified dementia without behavioral disturbance: Secondary | ICD-10-CM

## 2021-10-24 DIAGNOSIS — N186 End stage renal disease: Secondary | ICD-10-CM

## 2021-10-24 DIAGNOSIS — G4700 Insomnia, unspecified: Secondary | ICD-10-CM

## 2021-10-24 DIAGNOSIS — J441 Chronic obstructive pulmonary disease with (acute) exacerbation: Secondary | ICD-10-CM

## 2021-10-24 DIAGNOSIS — I10 Essential (primary) hypertension: Secondary | ICD-10-CM

## 2021-10-29 ENCOUNTER — Encounter: Payer: Self-pay | Admitting: Podiatry

## 2021-10-29 ENCOUNTER — Ambulatory Visit (INDEPENDENT_AMBULATORY_CARE_PROVIDER_SITE_OTHER): Payer: Medicare Other | Admitting: Podiatry

## 2021-10-29 ENCOUNTER — Other Ambulatory Visit: Payer: Self-pay

## 2021-10-29 ENCOUNTER — Telehealth: Payer: Self-pay | Admitting: Internal Medicine

## 2021-10-29 DIAGNOSIS — B351 Tinea unguium: Secondary | ICD-10-CM

## 2021-10-29 DIAGNOSIS — I2510 Atherosclerotic heart disease of native coronary artery without angina pectoris: Secondary | ICD-10-CM | POA: Diagnosis not present

## 2021-10-29 DIAGNOSIS — D689 Coagulation defect, unspecified: Secondary | ICD-10-CM | POA: Diagnosis not present

## 2021-10-29 DIAGNOSIS — M79675 Pain in left toe(s): Secondary | ICD-10-CM

## 2021-10-29 DIAGNOSIS — R0989 Other specified symptoms and signs involving the circulatory and respiratory systems: Secondary | ICD-10-CM | POA: Diagnosis not present

## 2021-10-29 DIAGNOSIS — M79674 Pain in right toe(s): Secondary | ICD-10-CM

## 2021-10-29 NOTE — Progress Notes (Signed)
°  Subjective:  Patient ID: Anthony [name], male    DOB: [birth year],   MRN: [id number]  Chief Complaint  Patient presents with   Nail Problem    Nail trim     81 y.o. male presents for concern of thickened elongated and painful nails that are difficult to trim. Requesting to have them trimmed today. Patient currently on blood thinners.  . Denies any other pedal complaints. Denies n/v/f/c.  PCP: Rich Number MD   Past Medical History:  Diagnosis Date   Alcohol abuse 2021   Anemia    Arthritis    CHF (congestive heart failure) ([place])    Chicken pox    Chronic kidney disease    M W F   COVID 02/2021   Dementia (HCC)    mild   Dyspnea    ESRD (end stage renal disease) on dialysis [facility])    GI bleed    History of blood transfusion 03/2021   Hypertension    Insomnia    Mitral regurgitation 05/16/2021   Pneumonia    Positive QuantiFERON-TB Gold test 05/16/2021   Stroke ([place])    TB lung, latent 05/16/2021    Objective:  Physical Exam: Vascular: DP/PT pulses 2/4 bilateral. CFT <3 seconds. Normal hair growth on digits. No edema.  Skin. No lacerations or abrasions bilateral feet. Nails 1-5 are thickened discolored and elongated with subungual debris.  Musculoskeletal: MMT 5/5 bilateral lower extremities in DF, PF, Inversion and Eversion. Deceased ROM in DF of ankle joint.  Neurological: Sensation intact to light touch.   Assessment:   1. Pain due to onychomycosis of toenails of both feet   2. Absent pedal pulses      Plan:  Patient was evaluated and treated and all questions answered. -Discussed and educated patient on  foot care, especially with  regards to the vascular, neurological and musculoskeletal systems.  -Discussed supportive shoes at all times and checking feet regularly.  -Mechanically debrided all nails 1-5 bilateral using sterile nail nipper and filed with dremel without incident  -ABIs ordered.  -Answered all patient questions -Patient to return   in 3 months for at risk foot care -Patient advised to call the office if any problems or questions arise in the meantime.   Lorenda Peck, DPM

## 2021-10-29 NOTE — Telephone Encounter (Signed)
Patient spouse Pamala Hurry) called in requesting to speak with [facility].  Informed Pamala Hurry that [place] would contact her when she gets a chance.  Pamala Hurry could be contacted at [phone].  Please advise.

## 2021-10-30 ENCOUNTER — Other Ambulatory Visit: Payer: Self-pay

## 2021-10-30 ENCOUNTER — Emergency Department (HOSPITAL_COMMUNITY): Payer: Medicare Other

## 2021-10-30 ENCOUNTER — Inpatient Hospital Stay (HOSPITAL_COMMUNITY)
Admission: EM | Admit: 2021-10-30 | Discharge: 2021-11-05 | DRG: 811 | Disposition: A | Discharge status: Skilled Nursing Facility | Payer: Medicare Other | Attending: Internal Medicine | Admitting: Internal Medicine

## 2021-10-30 DIAGNOSIS — E876 Hypokalemia: Secondary | ICD-10-CM

## 2021-10-30 DIAGNOSIS — M898X9 Other specified disorders of bone, unspecified site: Secondary | ICD-10-CM | POA: Diagnosis present

## 2021-10-30 DIAGNOSIS — R7989 Other specified abnormal findings of blood chemistry: Secondary | ICD-10-CM

## 2021-10-30 DIAGNOSIS — J449 Chronic obstructive pulmonary disease, unspecified: Secondary | ICD-10-CM | POA: Diagnosis present

## 2021-10-30 DIAGNOSIS — M79606 Pain in leg, unspecified: Secondary | ICD-10-CM

## 2021-10-30 DIAGNOSIS — I953 Hypotension of hemodialysis: Secondary | ICD-10-CM | POA: Diagnosis present

## 2021-10-30 DIAGNOSIS — D631 Anemia in chronic kidney disease: Secondary | ICD-10-CM | POA: Diagnosis present

## 2021-10-30 DIAGNOSIS — Z7951 Long term (current) use of inhaled steroids: Secondary | ICD-10-CM

## 2021-10-30 DIAGNOSIS — M79674 Pain in right toe(s): Secondary | ICD-10-CM | POA: Diagnosis not present

## 2021-10-30 DIAGNOSIS — Z8673 Personal history of transient ischemic attack (TIA), and cerebral infarction without residual deficits: Secondary | ICD-10-CM

## 2021-10-30 DIAGNOSIS — Z20822 Contact with and (suspected) exposure to covid-19: Secondary | ICD-10-CM | POA: Diagnosis present

## 2021-10-30 DIAGNOSIS — E785 Hyperlipidemia, unspecified: Secondary | ICD-10-CM | POA: Diagnosis present

## 2021-10-30 DIAGNOSIS — R0789 Other chest pain: Secondary | ICD-10-CM | POA: Diagnosis present

## 2021-10-30 DIAGNOSIS — G4700 Insomnia, unspecified: Secondary | ICD-10-CM | POA: Diagnosis present

## 2021-10-30 DIAGNOSIS — Z8615 Personal history of latent tuberculosis infection: Secondary | ICD-10-CM

## 2021-10-30 DIAGNOSIS — I480 Paroxysmal atrial fibrillation: Secondary | ICD-10-CM | POA: Diagnosis present

## 2021-10-30 DIAGNOSIS — R5381 Other malaise: Secondary | ICD-10-CM | POA: Diagnosis present

## 2021-10-30 DIAGNOSIS — R9431 Abnormal electrocardiogram [ECG] [EKG]: Secondary | ICD-10-CM | POA: Diagnosis present

## 2021-10-30 DIAGNOSIS — R079 Chest pain, unspecified: Principal | ICD-10-CM

## 2021-10-30 DIAGNOSIS — R0602 Shortness of breath: Secondary | ICD-10-CM

## 2021-10-30 DIAGNOSIS — Z751 Person awaiting admission to adequate facility elsewhere: Secondary | ICD-10-CM

## 2021-10-30 DIAGNOSIS — I5032 Chronic diastolic (congestive) heart failure: Secondary | ICD-10-CM | POA: Diagnosis present

## 2021-10-30 DIAGNOSIS — D638 Anemia in other chronic diseases classified elsewhere: Secondary | ICD-10-CM | POA: Diagnosis present

## 2021-10-30 DIAGNOSIS — Z79899 Other long term (current) drug therapy: Secondary | ICD-10-CM

## 2021-10-30 DIAGNOSIS — R778 Other specified abnormalities of plasma proteins: Secondary | ICD-10-CM

## 2021-10-30 DIAGNOSIS — F0390 Unspecified dementia without behavioral disturbance: Secondary | ICD-10-CM | POA: Diagnosis present

## 2021-10-30 DIAGNOSIS — Z8249 Family history of ischemic heart disease and other diseases of the circulatory system: Secondary | ICD-10-CM

## 2021-10-30 DIAGNOSIS — T8089XA Other complications following infusion, transfusion and therapeutic injection, initial encounter: Secondary | ICD-10-CM | POA: Diagnosis not present

## 2021-10-30 DIAGNOSIS — E611 Iron deficiency: Secondary | ICD-10-CM | POA: Diagnosis not present

## 2021-10-30 DIAGNOSIS — M79605 Pain in left leg: Secondary | ICD-10-CM | POA: Diagnosis not present

## 2021-10-30 DIAGNOSIS — N186 End stage renal disease: Secondary | ICD-10-CM | POA: Diagnosis present

## 2021-10-30 DIAGNOSIS — I132 Hypertensive heart and chronic kidney disease with heart failure and with stage 5 chronic kidney disease, or end stage renal disease: Secondary | ICD-10-CM | POA: Diagnosis present

## 2021-10-30 DIAGNOSIS — Z992 Dependence on renal dialysis: Secondary | ICD-10-CM

## 2021-10-30 DIAGNOSIS — Z227 Latent tuberculosis: Secondary | ICD-10-CM | POA: Diagnosis present

## 2021-10-30 DIAGNOSIS — F17210 Nicotine dependence, cigarettes, uncomplicated: Secondary | ICD-10-CM | POA: Diagnosis present

## 2021-10-30 DIAGNOSIS — E878 Other disorders of electrolyte and fluid balance, not elsewhere classified: Secondary | ICD-10-CM | POA: Diagnosis present

## 2021-10-30 DIAGNOSIS — Y841 Kidney dialysis as the cause of abnormal reaction of the patient, or of later complication, without mention of misadventure at the time of the procedure: Secondary | ICD-10-CM | POA: Diagnosis present

## 2021-10-30 DIAGNOSIS — Z8616 Personal history of COVID-19: Secondary | ICD-10-CM

## 2021-10-30 DIAGNOSIS — Z7982 Long term (current) use of aspirin: Secondary | ICD-10-CM

## 2021-10-30 DIAGNOSIS — D649 Anemia, unspecified: Secondary | ICD-10-CM | POA: Diagnosis present

## 2021-10-30 DIAGNOSIS — M1990 Unspecified osteoarthritis, unspecified site: Secondary | ICD-10-CM | POA: Diagnosis present

## 2021-10-30 DIAGNOSIS — I2510 Atherosclerotic heart disease of native coronary artery without angina pectoris: Secondary | ICD-10-CM | POA: Diagnosis present

## 2021-10-30 DIAGNOSIS — R0600 Dyspnea, unspecified: Secondary | ICD-10-CM | POA: Diagnosis present

## 2021-10-30 DIAGNOSIS — E1122 Type 2 diabetes mellitus with diabetic chronic kidney disease: Secondary | ICD-10-CM | POA: Diagnosis present

## 2021-10-30 HISTORY — DX: Atherosclerotic heart disease of native coronary artery without angina pectoris: I25.10

## 2021-10-30 HISTORY — DX: Paroxysmal atrial fibrillation: I48.0

## 2021-10-30 LAB — CBC WITH DIFFERENTIAL/PLATELET
Abs Immature Granulocytes: 0.07 10*3/uL (ref 0.00–0.07)
Basophils Absolute: 0 10*3/uL (ref 0.0–0.1)
Basophils Relative: 1 %
Eosinophils Absolute: 0.1 10*3/uL (ref 0.0–0.5)
Eosinophils Relative: 2 %
HCT: 38 % — ABNORMAL LOW (ref 39.0–52.0)
Hemoglobin: 11.2 g/dL — ABNORMAL LOW (ref 13.0–17.0)
Immature Granulocytes: 1 %
Lymphocytes Relative: 21 %
Lymphs Abs: 1.3 10*3/uL (ref 0.7–4.0)
MCH: 26.9 pg (ref 26.0–34.0)
MCHC: 29.5 g/dL — ABNORMAL LOW (ref 30.0–36.0)
MCV: 91.3 fL (ref 80.0–100.0)
Monocytes Absolute: 0.6 10*3/uL (ref 0.1–1.0)
Monocytes Relative: 10 %
Neutro Abs: 4.2 10*3/uL (ref 1.7–7.7)
Neutrophils Relative %: 65 %
Platelets: 246 10*3/uL (ref 150–400)
RBC: 4.16 MIL/uL — ABNORMAL LOW (ref 4.22–5.81)
RDW: 20.1 % — ABNORMAL HIGH (ref 11.5–15.5)
WBC: 6.3 10*3/uL (ref 4.0–10.5)
nRBC: 0.3 % — ABNORMAL HIGH (ref 0.0–0.2)

## 2021-10-30 LAB — I-STAT CHEM 8, ED
BUN: 9 mg/dL (ref 8–23)
Calcium, Ion: 0.84 mmol/L — CL (ref 1.15–1.40)
Chloride: 95 mmol/L — ABNORMAL LOW (ref 98–111)
Creatinine, Ser: 5.1 mg/dL — ABNORMAL HIGH (ref 0.61–1.24)
Glucose, Bld: 89 mg/dL (ref 70–99)
HCT: 52 % (ref 39.0–52.0)
Hemoglobin: 17.7 g/dL — ABNORMAL HIGH (ref 13.0–17.0)
Potassium: 3.1 mmol/L — ABNORMAL LOW (ref 3.5–5.1)
Sodium: 135 mmol/L (ref 135–145)
TCO2: 29 mmol/L (ref 22–32)

## 2021-10-30 LAB — TROPONIN I (HIGH SENSITIVITY)
Troponin I (High Sensitivity): 146 ng/L (ref <18)
Troponin I (High Sensitivity): 113 ng/L (ref <18)
Troponin I (High Sensitivity): 125 ng/L (ref <18)

## 2021-10-30 LAB — COMPREHENSIVE METABOLIC PANEL
ALT: 6 U/L (ref 0–44)
AST: 16 U/L (ref 15–41)
Albumin: 3.2 g/dL — ABNORMAL LOW (ref 3.5–5.0)
Alkaline Phosphatase: 85 U/L (ref 38–126)
Anion gap: 14 (ref 5–15)
BUN: 8 mg/dL (ref 8–23)
CO2: 28 mmol/L (ref 22–32)
Calcium: 8 mg/dL — ABNORMAL LOW (ref 8.9–10.3)
Chloride: 94 mmol/L — ABNORMAL LOW (ref 98–111)
Creatinine, Ser: 4.83 mg/dL — ABNORMAL HIGH (ref 0.61–1.24)
GFR, Estimated: 12 mL/min — ABNORMAL LOW (ref >60)
Glucose, Bld: 83 mg/dL (ref 70–99)
Potassium: 3.1 mmol/L — ABNORMAL LOW (ref 3.5–5.1)
Sodium: 136 mmol/L (ref 135–145)
Total Bilirubin: 0.6 mg/dL (ref 0.3–1.2)
Total Protein: 7.8 g/dL (ref 6.5–8.1)

## 2021-10-30 LAB — CBG MONITORING, ED: Glucose-Capillary: 98 mg/dL (ref 70–99)

## 2021-10-30 LAB — RESP PANEL BY RT-PCR (FLU A&B, COVID) ARPGX2
Influenza A by PCR: NEGATIVE
Influenza B by PCR: NEGATIVE
SARS Coronavirus 2 by RT PCR: NEGATIVE

## 2021-10-30 MED ORDER — NITROGLYCERIN 0.4 MG SL SUBL: 0.4000 mg | SUBLINGUAL_TABLET | SUBLINGUAL | Status: DC | PRN

## 2021-10-30 MED ORDER — ASPIRIN 325 MG PO TABS
325.0000 mg | ORAL_TABLET | Freq: Every day | ORAL | Status: DC
  Administered 2021-10-30: 325 mg via ORAL
  Filled 2021-10-30: qty 1

## 2021-10-30 NOTE — ED Provider Notes (Signed)
[facility] EMERGENCY DEPARTMENT Provider Note   CSN: [id number] Arrival date & time: 10/30/21  1611     History  Chief Complaint  Patient presents with   Chest Pain   Shortness of Breath    Anthony [name] is a 81 y.o. male.   Chest Pain Associated symptoms: shortness of breath   Shortness of Breath Associated symptoms: chest pain    81 year old male with a history of ESRD on dialysis MWF, chronic diastolic heart failure, paroxysmal atrial fibrillation, COPD, CAD, HLD, dementia, latent TB who presents to the emergency department with a chief complaint of chest pain that occurred while at dialysis.  The patient had 45 minutes of dialysis left today when he developed substernal chest pressure, diaphoresis and shortness of breath.  Symptoms persisted and he was taken to the emergency department after dialysis was stopped early.  He was administered aspirin with EMS.  Chest pressure had resolved on arrival to the emergency department but subsequently recurred.  Home Medications Prior to Admission medications   Medication Sig Start Date End Date Taking? Authorizing Provider  ADVAIR HFA [phone] MCG/ACT inhaler INHALE 2 PUFFS INTO THE LUNGS IN THE MORNING AND AT BEDTIME 01/17/21   Isaac Bliss, Rayford Halsted, MD  albuterol (PROVENTIL HFA) 108 (90 Base) MCG/ACT inhaler Inhale 2 puffs into the lungs every 4 (four) hours as needed for wheezing or shortness of breath. 03/08/20   Laurey Morale, MD  ASPIRIN LOW DOSE 81 MG chewable tablet Chew 81 mg by mouth daily. 03/05/21   [provider]  atorvastatin (LIPITOR) 20 MG tablet TAKE 1 TABLET BY MOUTH EVERYDAY AT BEDTIME Patient taking differently: Take 20 mg by mouth daily. 07/30/20   Isaac Bliss, Rayford Halsted, MD  budesonide-formoterol [facility]) 160-4.5 MCG/ACT inhaler Inhale 2 puffs into the lungs daily. 04/12/21   [provider]  calcium acetate (PHOSLO) 667 MG capsule Take 667 mg by mouth 3 (three) times daily  with meals.    [provider]  carboxymethylcellulose (REFRESH PLUS) 0.5 % SOLN Place 1 drop into both eyes daily as needed (dry eyes). 12/07/20   [provider]  carvedilol (COREG) 6.25 MG tablet Take 1 tablet (6.25 mg total) by mouth 2 (two) times daily with a meal. 03/02/20   Black, Lezlie Octave, NP  hydrALAZINE (APRESOLINE) 50 MG tablet Take 50 mg by mouth every 8 (eight) hours.    [provider]  isoniazid (NYDRAZID) 300 MG tablet TAKE 1 TABLET BY MOUTH EVERY DAY 07/26/21   Tommy Medal, Lavell Islam, MD  isosorbide mononitrate (IMDUR) 30 MG 24 hr tablet Take 30 mg by mouth daily. 04/02/21   [provider]  lidocaine-prilocaine (EMLA) cream Apply 1 application topically daily as needed (port access).    [provider]  memantine (NAMENDA) 5 MG tablet Take 5 mg by mouth daily. 03/12/21   [provider]  nicotine (NICODERM CQ - DOSED IN MG/24 HOURS) 14 mg/24hr patch Place 1 patch (14 mg total) onto the skin daily. 03/15/20   Isaac Bliss, Rayford Halsted, MD  pantoprazole (PROTONIX) 40 MG tablet Take 1 tablet (40 mg total) by mouth daily. 04/24/21   Mariel Aloe, MD  pyridOXINE (VITAMIN B-6) 50 MG tablet Take 1 tablet (50 mg total) by mouth daily. 05/16/21 02/10/22  Truman Hayward, MD  Tiotropium Bromide Monohydrate (SPIRIVA RESPIMAT) 2.5 MCG/ACT AERS Inhale 2 puffs into the lungs daily. 03/15/20   Isaac Bliss, Rayford Halsted, MD  traZODone (DESYREL) 50 MG  tablet Take 1-2 tablets (50-100 mg total) by mouth at bedtime as needed. Patient taking differently: Take 50-100 mg by mouth at bedtime as needed for sleep. 06/12/21   Isaac Bliss, Rayford Halsted, MD      Allergies    Patient has no known allergies.    Review of Systems   Review of Systems  Respiratory:  Positive for shortness of breath.   Cardiovascular:  Positive for chest pain.  All other systems reviewed and are negative.  Physical Exam Updated Vital Signs BP 111/67    Pulse 69    Temp (!)  97.5 F (36.4 C) (Oral)    Resp 16    Ht 5\' 7"  (1.702 m)    SpO2 96%    BMI 24.26 kg/m  Physical Exam Vitals and nursing note reviewed.  Constitutional:      General: He is not in acute distress.    Appearance: He is well-developed.  HENT:     Head: Normocephalic and atraumatic.  Eyes:     Conjunctiva/sclera: Conjunctivae normal.     Pupils: Pupils are equal, round, and reactive to light.  Cardiovascular:     Rate and Rhythm: Normal rate and regular rhythm.     Heart sounds: No murmur heard. Pulmonary:     Effort: Pulmonary effort is normal. No respiratory distress.     Breath sounds: Normal breath sounds.  Abdominal:     General: There is no distension.     Palpations: Abdomen is soft.     Tenderness: There is no abdominal tenderness. There is no guarding.  Musculoskeletal:        General: No swelling, deformity or signs of injury.     Cervical back: Neck supple.  Skin:    General: Skin is warm and dry.     Capillary Refill: Capillary refill takes less than 2 seconds.     Findings: No lesion or rash.  Neurological:     General: No focal deficit present.     Mental Status: He is alert. Mental status is at baseline.  Psychiatric:        Mood and Affect: Mood normal.    ED Results / Procedures / Treatments   Labs (all labs ordered are listed, but only abnormal results are displayed) Labs Reviewed  CBC WITH DIFFERENTIAL/PLATELET - Abnormal; Notable for the following components:      Result Value   RBC 4.16 (*)    Hemoglobin 11.2 (*)    HCT 38.0 (*)    MCHC 29.5 (*)    RDW 20.1 (*)    nRBC 0.3 (*)    All other components within normal limits  COMPREHENSIVE METABOLIC PANEL - Abnormal; Notable for the following components:   Potassium 3.1 (*)    Chloride 94 (*)    Creatinine, Ser 4.83 (*)    Calcium 8.0 (*)    Albumin 3.2 (*)    GFR, Estimated 12 (*)    All other components within normal limits  I-STAT CHEM 8, ED - Abnormal; Notable for the following components:    Potassium 3.1 (*)    Chloride 95 (*)    Creatinine, Ser 5.10 (*)    Calcium, Ion 0.84 (*)    Hemoglobin 17.7 (*)    All other components within normal limits  TROPONIN I (HIGH SENSITIVITY) - Abnormal; Notable for the following components:   Troponin I (High Sensitivity) 146 (*)    All other components within normal limits  TROPONIN I (HIGH SENSITIVITY) -  Abnormal; Notable for the following components:   Troponin I (High Sensitivity) 113 (*)    All other components within normal limits  TROPONIN I (HIGH SENSITIVITY) - Abnormal; Notable for the following components:   Troponin I (High Sensitivity) 125 (*)    All other components within normal limits  RESP PANEL BY RT-PCR (FLU A&B, COVID) ARPGX2  CBG MONITORING, ED    EKG EKG Interpretation  Date/Time:  Wednesday October 30 2021 18:17:28 EST Ventricular Rate:  69 PR Interval:  187 QRS Duration: 106 QT Interval:  512 QTC Calculation: 549 R Axis:   255 Text Interpretation: Sinus or ectopic atrial rhythm Consider left atrial enlargement Left anterior fascicular block Abnormal R-wave progression, early transition Repol abnrm, prob ischemia, anterolateral lds Prolonged QT interval Confirmed by Regan Lemming ([id number]) on 10/30/2021 8:16:04 PM  Radiology DG Chest 2 View  Result Date: 10/30/2021 CLINICAL DATA:  Shortness of breath EXAM: CHEST - 2 VIEW COMPARISON:  Previous studies including the examination of 09/09/2021 FINDINGS: Cardiac size is within normal limits. There are no signs of pulmonary edema. There are linear densities in the left lower lung fields. There is blunting of left lateral CP angle. There is no pneumothorax. Tip of right IJ dialysis catheter is seen in the right atrium. There is vascular coil in the left axilla. IMPRESSION: There are linear densities in the left lower lung fields suggesting subsegmental atelectasis. Possible small left pleural effusion. There are no signs of alveolar pulmonary edema. Tip of dialysis catheter  is seen in the right atrium. Electronically Signed   By: Elmer Picker M.D.   On: 10/30/2021 18:05    Procedures Procedures    Medications Ordered in ED Medications  nitroGLYCERIN (NITROSTAT) SL tablet 0.4 mg (has no administration in time range)    ED Course/ Medical Decision Making/ A&P Clinical Course as of 10/31/21 0019  Wed Oct 30, 2021  2144 Troponin I (High Sensitivity)(!!): 113 [JL]  Thu Oct 31, 2021  0003 Troponin I (High Sensitivity)(!!): 125 [JL]    Clinical Course User Index [JL] Regan Lemming, MD                           Medical Decision Making Amount and/or Complexity of Data Reviewed Labs:  Decision-making details documented in ED Course. Radiology: ordered.  Risk Prescription drug management. Decision regarding hospitalization.   81 year old male with a history of ESRD on dialysis MWF, chronic diastolic heart failure, paroxysmal atrial fibrillation, COPD, CAD, HLD, dementia, latent TB who presents to the emergency department with a chief complaint of chest pain that occurred while at dialysis.  The patient had 45 minutes of dialysis left today when he developed substernal chest pressure, diaphoresis and shortness of breath.  Symptoms persisted and he was taken to the emergency department after dialysis was stopped early.  He was administered aspirin with EMS.  Chest pressure had resolved on arrival to the emergency department but subsequently recurred.  On my evaluation on arrival, the patient was afebrile, hemodynamically stable, saturating well on room air.  Sinus rhythm noted on cardiac telemetry.  The patient presents with an episode of chest pressure.  On chart review, the patient has chronic chest pain with a chronically elevated troponin which may be in the setting of his ESRD.  On further chart review, the patient is not had an ischemic evaluation since 2019 and was last seen cardiology clinic in June 2021.  Initial EKG was performed which revealed  ST depressions in the anterolateral leads with associated T wave inversions, similar to prior EKG performed in January however some increased depth to the T wave inversions.  Chest x-ray was performed which revealed no acute cardiac or pulmonary abnormality.  Possible small left pleural effusion was present with no evidence of pulmonary edema.  The patient was status 325 mg of aspirin with EMS.  Initial cardiac troponins were elevated to greater than 100 but remained flat.  COVID-19 and influenza PCR testing was collected and resulted negative.  The patient had no significant electrolyte abnormality with potassium of 3.1, BUN 8.  He had no leukocytosis and stable anemia of chronic renal disease to 11.2.  The patient was observed in the emergency department for several hours and unfortunately had recurrence of his chest pressure.  My primary concern is for unstable angina versus NSTEMI.  Due to this in the setting of an elevated troponin I did touch base with cardiology and hospitalist medicine for admission for observation for provocative testing.  I have lower suspicion for NSTEMI as the patient has a chronically elevated troponin however given the patient's lack of recent provocative testing, I think it is reasonable to admit the patient for observation for stress testing.  I spoke with cardiology on-call, Dr. Alfred Levins who was in agreement with this plan.  We will hold off on heparinization for now.  With Dr. Flossie Buffy who accepted the patient in admission for observation.   Final Clinical Impression(s) / ED Diagnoses Final diagnoses:  Chest pain, unspecified type  Elevated troponin    Rx / DC Orders ED Discharge Orders     None         Regan Lemming, MD 10/31/21 [phone]

## 2021-10-30 NOTE — ED Notes (Signed)
Chem 8 results given to Dr Armandina Gemma

## 2021-10-30 NOTE — ED Triage Notes (Signed)
Arrived via EMS; reported patient c/o shortness of breath and chest pain towards the end of dialysis session (45 mins left) reports diaphoretic as well. EMS ekg showed Afib on 12 lead, patient denies hx. Stable VS: 116/68 99% RA; 74 HR; ASA 324 mg given PTA;   Upon arrival to triage patient was able to answer questions. Patient appeared uncomfortable; unable to read Sp02; probed re-placed; placed on NRB at 12LPM,

## 2021-10-30 NOTE — Chronic Care Management (AMB) (Signed)
Chronic Care Management    Clinical Social Work Note  10/30/2021 Name: Anthony [name] MRN: [id number] DOB: July 12, 1941  Anthony [name] is a 81 y.o. year old male who is a primary care patient of Isaac Bliss, Rayford Halsted, MD. The CCM team was consulted to assist the patient with chronic disease management and/or care coordination needs related to: Level of Care Concerns.   Engaged with patient's spouse by telephone for follow up visit in response to provider referral for social work chronic care management and care coordination services.   Consent to Services:  The patient was given information about Chronic Care Management services, agreed to services, and gave verbal consent prior to initiation of services.  Please see initial visit note for detailed documentation.   Patient agreed to services and consent obtained.   Assessment: Review of patient past medical history, allergies, medications, and health status, including review of relevant consultants reports was performed today as part of a comprehensive evaluation and provision of chronic care management and care coordination services.     SDOH (Social Determinants of Health) assessments and interventions performed:    Advanced Directives Status: Not addressed in this encounter.  CCM Care Plan  No Known Allergies  Outpatient Encounter Medications as of 10/24/2021  Medication Sig Note   ADVAIR HFA 45-21 MCG/ACT inhaler INHALE 2 PUFFS INTO THE LUNGS IN THE MORNING AND AT BEDTIME    albuterol (PROVENTIL HFA) 108 (90 Base) MCG/ACT inhaler Inhale 2 puffs into the lungs every 4 (four) hours as needed for wheezing or shortness of breath.    ASPIRIN LOW DOSE 81 MG chewable tablet Chew 81 mg by mouth daily.    atorvastatin (LIPITOR) 20 MG tablet TAKE 1 TABLET BY MOUTH EVERYDAY AT BEDTIME (Patient taking differently: Take 20 mg by mouth daily.)    budesonide-formoterol (SYMBICORT) 160-4.5 MCG/ACT inhaler Inhale 2 puffs into the lungs daily.     calcium acetate (PHOSLO) 667 MG capsule Take 667 mg by mouth 3 (three) times daily with meals. 08/17/2021: Pharmacy record says 2 capsules 3 times daily   carboxymethylcellulose (REFRESH PLUS) 0.5 % SOLN Place 1 drop into both eyes daily as needed (dry eyes).    carvedilol (COREG) 6.25 MG tablet Take 1 tablet (6.25 mg total) by mouth 2 (two) times daily with a meal.    hydrALAZINE (APRESOLINE) 50 MG tablet Take 50 mg by mouth every 8 (eight) hours.    isoniazid (NYDRAZID) 300 MG tablet TAKE 1 TABLET BY MOUTH EVERY DAY    isosorbide mononitrate (IMDUR) 30 MG 24 hr tablet Take 30 mg by mouth daily.    lidocaine-prilocaine (EMLA) cream Apply 1 application topically daily as needed (port access).    memantine (NAMENDA) 5 MG tablet Take 5 mg by mouth daily.    nicotine (NICODERM CQ - DOSED IN MG/24 HOURS) 14 mg/24hr patch Place 1 patch (14 mg total) onto the skin daily.    pantoprazole (PROTONIX) 40 MG tablet Take 1 tablet (40 mg total) by mouth daily.    pyridOXINE (VITAMIN B-6) 50 MG tablet Take 1 tablet (50 mg total) by mouth daily.    Tiotropium Bromide Monohydrate (SPIRIVA RESPIMAT) 2.5 MCG/ACT AERS Inhale 2 puffs into the lungs daily.    traZODone (DESYREL) 50 MG tablet Take 1-2 tablets (50-100 mg total) by mouth at bedtime as needed. (Patient taking differently: Take 50-100 mg by mouth at bedtime as needed for sleep.)    No facility-administered encounter medications on file as of 10/24/2021.    Patient  Active Problem List   Diagnosis Date Noted   Positive QuantiFERON-TB Gold test 05/16/2021   TB lung, latent 05/16/2021   Mitral regurgitation 05/16/2021   Malnutrition of moderate degree 04/18/2021   Anemia 04/16/2021   Symptomatic anemia 04/16/2021   COPD (chronic obstructive pulmonary disease) ([place]) 04/16/2021   CAD (coronary artery disease) 04/16/2021   HLD (hyperlipidemia) 04/16/2021   Prolonged QT interval 04/16/2021   Abnormal ECG 04/16/2021   Dementia without behavioral  disturbance ([place]) 04/16/2021   COPD with acute exacerbation ([place]) 04/16/2021   Occult blood positive stool 04/16/2021   COPD exacerbation ([place]) 03/01/2020   Tobacco use disorder 03/01/2020   Chronic diastolic heart failure (HCC)    PAF (paroxysmal atrial fibrillation) (HCC)    Anemia of chronic disease 02/29/2020   Hypertensive urgency 02/29/2020   Elevated troponin 02/29/2020   Chest pain 02/29/2020   ESRD (end stage renal disease) on dialysis ([place]) 12/15/2019   HTN (hypertension) 12/15/2019   Insomnia     Conditions to be addressed/monitored: CHF, CAD, HTN, COPD, ESRD, and Dementia; Level of care concerns  Care Plan : LCSW Plan of Care  Updates made by Rebekah Chesterfield, LCSW since 10/30/2021 12:00 AM     Problem: Quality of Life (General Plan of Care)      Goal: Quality of Life Maintained   Start Date: 10/17/2021  This Visit's Progress: On track  Recent Progress: On track  Priority: High  Note:   Current barriers:   Patient's health has declined and longer able to meet ADL's at home.  Acknowledges deficits with placement process and needs support in order to meet this need Currently unable to  independently self manage needs related to chronic health conditions.  Clinical Goal(s): Over the next 30 to 45 days patient will be placed in a facility, patient and family will work with LCSW, to coordinate needs for long term skilled nursing facility  Placement Clinical Interventions: 1:1 collaboration with primary care provider regarding development and update of comprehensive plan of care as evidenced by provider attestation and co-signature Inter-disciplinary care team collaboration (see longitudinal plan of care) Assessed needs and reviewed facility placement process; including applying for Medicaid Patient is a veteran and family are interested in resources that may assist. LCSW provided them with contact info for local [place] office. Pt's spouse agreed to follow up to obtain  additional information 2/2: Patient's brother, [name], shared he and spouse would like pt to be placed at [facility] LCSW spoke with Arbie Cookey who agreed to email application. LCSW sent application pt's spouse and brother to complete Patient's spouse and patient's brother provided hx during visit. LCSW informed family of completion and fax to [place] at preferred facility Provided a list of facilities and educational information "when a loved one needs Long-term Care" Obtained patient's verbal permission to fax [place] with primary care provider. Patient's family requested prescription for shower chair, home ramp, walker, and wheelchair care guide referral Collaborate with identified facilities and assist patient as needed to understand facility selection process Assist patient with faxing FL2 to identified facilities once completed and signed by PCP Active listening / Reflection utilized  Emotional Support Provided Provided psychoeducation for mental health needs  Caregiver stress acknowledged. Family receives support from adult granddaughter, who resides in home  Verbalization of feelings encouraged  Discussed caregiver resources and support Patient Goals/Self-Care Activities: Over the next 90 days Attend scheduled appts Utilize healthy coping skills and/or supportive resources discussed Select a facilities from the  list provided  Call to check on availability and visit facilities Call me once you have selected a facility and I will assist with sending a copy of the [place], [email or web address] Phone [phone] 6:23 AM

## 2021-10-30 NOTE — Patient Instructions (Signed)
Visit Information  Thank you for taking time to visit with me today. Please don't hesitate to contact me if I can be of assistance to you before our next scheduled telephone appointment.  Following are the goals we discussed today:  Patient Goals/Self-Care Activities: Over the next 90 days Attend scheduled appts Utilize healthy coping skills and/or supportive resources discussed Select a facilities from the list provided  Call to check on availability and visit facilities Call me once you have selected a facility and I will assist with sending a copy of the FL2   Please call the care guide team at [phone] if you need to cancel or reschedule your appointment.   If you are experiencing a Mental Health or [place] or need someone to talk to, please call the [place] National Suicide Prevention Lifeline: 332-620-6668 or TTY: (580)178-1401 TTY 231-013-3413) to talk to a trained counselor call 911   The patient verbalized understanding of instructions, educational materials, and care plan provided today and declined offer to receive copy of patient instructions, educational materials, and care plan.   Christa See, MSW, [email or web address] Phone [phone] 6:24 AM

## 2021-10-31 ENCOUNTER — Observation Stay (HOSPITAL_BASED_OUTPATIENT_CLINIC_OR_DEPARTMENT_OTHER): Payer: Medicare Other

## 2021-10-31 ENCOUNTER — Encounter (HOSPITAL_COMMUNITY): Payer: Self-pay | Admitting: Family Medicine

## 2021-10-31 DIAGNOSIS — M79605 Pain in left leg: Secondary | ICD-10-CM

## 2021-10-31 DIAGNOSIS — R9431 Abnormal electrocardiogram [ECG] [EKG]: Secondary | ICD-10-CM

## 2021-10-31 DIAGNOSIS — Z227 Latent tuberculosis: Secondary | ICD-10-CM

## 2021-10-31 DIAGNOSIS — R079 Chest pain, unspecified: Secondary | ICD-10-CM

## 2021-10-31 DIAGNOSIS — R072 Precordial pain: Secondary | ICD-10-CM

## 2021-10-31 DIAGNOSIS — Z992 Dependence on renal dialysis: Secondary | ICD-10-CM

## 2021-10-31 DIAGNOSIS — N186 End stage renal disease: Secondary | ICD-10-CM | POA: Diagnosis not present

## 2021-10-31 DIAGNOSIS — I5032 Chronic diastolic (congestive) heart failure: Secondary | ICD-10-CM

## 2021-10-31 LAB — COMPREHENSIVE METABOLIC PANEL
ALT: <5 U/L (ref 0–44)
AST: 8 U/L — ABNORMAL LOW (ref 15–41)
Albumin: 2.9 g/dL — ABNORMAL LOW (ref 3.5–5.0)
Alkaline Phosphatase: 87 U/L (ref 38–126)
Anion gap: 14 (ref 5–15)
BUN: 22 mg/dL (ref 8–23)
CO2: 28 mmol/L (ref 22–32)
Calcium: 8.6 mg/dL — ABNORMAL LOW (ref 8.9–10.3)
Chloride: 98 mmol/L (ref 98–111)
Creatinine, Ser: 8.79 mg/dL — ABNORMAL HIGH (ref 0.61–1.24)
GFR, Estimated: 6 mL/min — ABNORMAL LOW (ref >60)
Glucose, Bld: 109 mg/dL — ABNORMAL HIGH (ref 70–99)
Potassium: 3.8 mmol/L (ref 3.5–5.1)
Sodium: 140 mmol/L (ref 135–145)
Total Bilirubin: 0.3 mg/dL (ref 0.3–1.2)
Total Protein: 6.9 g/dL (ref 6.5–8.1)

## 2021-10-31 LAB — CBC WITH DIFFERENTIAL/PLATELET
Abs Immature Granulocytes: 0.05 10*3/uL (ref 0.00–0.07)
Basophils Absolute: 0 10*3/uL (ref 0.0–0.1)
Basophils Relative: 1 %
Eosinophils Absolute: 0.1 10*3/uL (ref 0.0–0.5)
Eosinophils Relative: 3 %
HCT: 37.5 % — ABNORMAL LOW (ref 39.0–52.0)
Hemoglobin: 10.9 g/dL — ABNORMAL LOW (ref 13.0–17.0)
Immature Granulocytes: 1 %
Lymphocytes Relative: 24 %
Lymphs Abs: 1.1 10*3/uL (ref 0.7–4.0)
MCH: 26.8 pg (ref 26.0–34.0)
MCHC: 29.1 g/dL — ABNORMAL LOW (ref 30.0–36.0)
MCV: 92.1 fL (ref 80.0–100.0)
Monocytes Absolute: 0.6 10*3/uL (ref 0.1–1.0)
Monocytes Relative: 11 %
Neutro Abs: 3 10*3/uL (ref 1.7–7.7)
Neutrophils Relative %: 60 %
Platelets: 292 10*3/uL (ref 150–400)
RBC: 4.07 MIL/uL — ABNORMAL LOW (ref 4.22–5.81)
RDW: 20.2 % — ABNORMAL HIGH (ref 11.5–15.5)
WBC: 4.9 10*3/uL (ref 4.0–10.5)
nRBC: 0 % (ref 0.0–0.2)

## 2021-10-31 LAB — ECHOCARDIOGRAM COMPLETE
AR max vel: 2.48 cm2
AV Peak grad: 8.8 mmHg
Ao pk vel: 1.49 m/s
Area-P 1/2: 2.54 cm2
Height: 67 in
S' Lateral: 3.8 cm
Single Plane A4C EF: 36 %

## 2021-10-31 LAB — PHOSPHORUS: Phosphorus: 6.4 mg/dL — ABNORMAL HIGH (ref 2.5–4.6)

## 2021-10-31 LAB — MAGNESIUM
Magnesium: 2 mg/dL (ref 1.7–2.4)
Magnesium: 2.2 mg/dL (ref 1.7–2.4)

## 2021-10-31 MED ORDER — TRAZODONE HCL 50 MG PO TABS
50.0000 mg | ORAL_TABLET | Freq: Every evening | ORAL | Status: DC | PRN
  Administered 2021-10-31: 50 mg via ORAL
  Filled 2021-10-31: qty 1

## 2021-10-31 MED ORDER — ACETAMINOPHEN 325 MG PO TABS
650.0000 mg | ORAL_TABLET | ORAL | Status: DC | PRN
  Administered 2021-11-02 – 2021-11-04 (×6): 650 mg via ORAL
  Filled 2021-10-31 (×6): qty 2

## 2021-10-31 MED ORDER — CHLORHEXIDINE GLUCONATE CLOTH 2 % EX PADS
6.0000 | MEDICATED_PAD | Freq: Every day | CUTANEOUS | Status: DC
  Administered 2021-11-01 – 2021-11-05 (×5): 6 via TOPICAL

## 2021-10-31 MED ORDER — HEPARIN SODIUM (PORCINE) 5000 UNIT/ML IJ SOLN
5000.0000 [IU] | Freq: Three times a day (TID) | INTRAMUSCULAR | Status: DC
  Administered 2021-10-31 – 2021-11-05 (×14): 5000 [IU] via SUBCUTANEOUS
  Filled 2021-10-31 (×14): qty 1

## 2021-10-31 MED ORDER — POTASSIUM CHLORIDE CRYS ER 20 MEQ PO TBCR
40.0000 meq | EXTENDED_RELEASE_TABLET | Freq: Once | ORAL | Status: AC
  Administered 2021-10-31: 40 meq via ORAL
  Filled 2021-10-31: qty 2

## 2021-10-31 MED ORDER — MEMANTINE HCL 10 MG PO TABS
5.0000 mg | ORAL_TABLET | Freq: Every day | ORAL | Status: DC
  Administered 2021-11-01 – 2021-11-05 (×5): 5 mg via ORAL
  Filled 2021-10-31 (×6): qty 1

## 2021-10-31 NOTE — Progress Notes (Signed)
Echocardiogram 2D Echocardiogram has been performed.  Anthony [name] 10/31/2021, 1:41 PM

## 2021-10-31 NOTE — Care Management Obs Status (Signed)
[facility] NOTIFICATION   Patient Details  Name: Anthony [name] MRN: [id number] Date of Birth: March 27, 1941   Medicare Observation Status Notification Given:  Ernesta Amble, RN 10/31/2021, 10:58 PM

## 2021-10-31 NOTE — Progress Notes (Signed)
Brief note  End-stage renal disease Monday Wednesday Friday hemodialysis [facility].  Admitted with chest pain.  Blood pressure 132/69 pulse 73 temperature afebrile O2 sats 100% room air  Sodium 135 potassium 3.1 chloride 95 BUN 9 creatinine 5.1 ionized calcium 0.84 magnesium 2.0 troponin 113 hemoglobin 17.1  We will plan dialysis 11/01/2021

## 2021-10-31 NOTE — Progress Notes (Signed)
VASCULAR LAB    Left lower extremity venous duplex has been performed.  See CV proc for preliminary results.   Emmilyn Crooke, RVT 10/31/2021, 8:30 AM

## 2021-10-31 NOTE — Progress Notes (Signed)
Care started prior to midnight in the emergency room and patient was admitted early this morning after midnight by Dr. Ileene Musa and I am in a current agreement with her assessment and plan.  Additional changes to the plan of care been made accordingly.  Patient is a 81 year old African-American male with a past medical history significant for moderate to ESRD on Monday Wednesday Friday, history of latent TB, history of type 2 diabetes mellitus, history of chronic diastolic CHF, COPD and dementia who came in with chest pain.  Chest pain developed after he was being dialyzed yesterday and he states it felt like somebody sitting on his chest.  He also reported compliance with his medications and also associate some shortness of breath and left calf tenderness.  He is currently using tobacco but denies any illicit drug use.  Troponin was elevated on admission but relatively flat and stable.  Cardiology was consulted for further evaluation recommendations.  He will be evaluated for his chest pain rule ACS and have PT OT to eval and treat.  Patient's family they cannot take care of him at home anymore and so we will obtain physical therapy evaluation.  He also received a lower extremity venous duplex for his left leg pain and it showed no evidence of DVT.  Chest pain has echo is being repeated.  Cardiology was consulted and he recently had a cardiac catheterization back in June 2022 which showed nonobstructive CAD as outlined in the cardiology did not feel that they needed to pursue further ischemic evaluation.  Recommended home dose of aspirin 81 mg and increasing his statin.  Cardiology cleared the patient for discharge however we are awaiting physical therapy evaluation.  Given that this is still pending we will notify nephrology that his regular scheduled dialysis session is tomorrow.  Likely can be discharged pending physical therapy evaluations given that cardiology feels that he does not need any ischemic  work-up at this time.

## 2021-10-31 NOTE — Consult Note (Signed)
Cardiology Consultation:   Patient ID: Anthony [name] MRN: [id number]; DOB: 1941/08/12  Admit date: 10/30/2021 Date of Consult: 10/31/2021  PCP:  Isaac Bliss, Rayford Halsted, MD   [facility] Providers Cardiologist:  Fransico Him, MD       {   Patient Profile:   Anthony [name] is a 81 y.o. male with a hx of paroxysmal atrial fibrillation not on anticoagulation given history of recurrent GI bleeds, substance abuse, end-stage renal disease dialysis dependent, latent tuberculosis, diabetes mellitus, chronic diastolic congestive heart failure, COPD, dementia, previous CVA, hypertension who is being seen 10/31/2021 for the evaluation of chest pain at the request of [facility], DO.  History of Present Illness:   Nuclear study June 2021 showed ejection fraction 39%, apical infarct versus artifact and ejection fraction 39%.  No ischemia.  Dobutamine echocardiogram April 2022 at [facility] showed baseline mild LV systolic dysfunction with possible lack of augmentation of the septum. 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 Treated medically.  Echocardiogram July 2022 showed normal LV function, mild left ventricular hypertrophy, grade 2 diastolic dysfunction, moderate left atrial enlargement, mild mitral stenosis with mitral valve area 1.9 cm.  Patient has had problems with recurrent chest pain.  He states he has dyspnea on exertion and chest pain with exertion.  At the end of dialysis yesterday he developed pain in the lower chest/epigastric area that was diffuse.  Not pleuritic or positional.  Described as pressure.  There was associated dyspnea but no nausea or diaphoresis.  The pain lasted approximately 3 hours and resolved.  He has not had syncope.  He was admitted and cardiology now asked to evaluate.  Presently pain-free.   Past  Medical History:  Diagnosis Date   Alcohol abuse 2021   Anemia    Arthritis    CAD (coronary artery disease)    CHF (congestive heart failure) ([place])    Chicken pox    COVID 02/2021   Dementia (HCC)    mild   ESRD (end stage renal disease) on dialysis ([place])    GI bleed    History of blood transfusion 03/2021   Hypertension    Insomnia    Mitral regurgitation 05/16/2021   PAF (paroxysmal atrial fibrillation) (HCC)    Pneumonia    Positive QuantiFERON-TB Gold test 05/16/2021   Stroke ([place])    TB lung, latent 05/16/2021    Past Surgical History:  Procedure Laterality Date   AV FISTULA PLACEMENT Right 05/14/2021   Procedure: INSERTION OF ARTERIOVENOUS (AV) GORE-TEX GRAFT ARM;  Surgeon: Angelia Mould, MD;  Location: [facility];  Service: Vascular;  Laterality: Right;   ENTEROSCOPY N/A 04/18/2021   Procedure: ENTEROSCOPY;  Surgeon: Jackquline Denmark, MD;  Location: [place];  Service: Endoscopy;  Laterality: N/A;   HOT HEMOSTASIS N/A 04/18/2021   Procedure: HOT HEMOSTASIS (ARGON PLASMA COAGULATION/BICAP);  Surgeon: Jackquline Denmark, MD;  Location: [facility] ENDOSCOPY;  Service: Endoscopy;  Laterality: N/A;   SUBMUCOSAL TATTOO INJECTION  04/18/2021   Procedure: SUBMUCOSAL TATTOO INJECTION;  Surgeon: Jackquline Denmark, MD;  Location: [facility] ENDOSCOPY;  Service: Endoscopy;;     Inpatient Medications: Scheduled Meds:  heparin  5,000 Units Subcutaneous Q8H   Continuous Infusions:  PRN Meds: acetaminophen, nitroGLYCERIN  Allergies:   No Known Allergies  Social History:   Social History   Socioeconomic History   Marital status: Married  Spouse name: Not on file   Number of children: Not on file   Years of education: Not on file   Highest education level: Not on file  Occupational History   Not on file  Tobacco Use   Smoking status: Every Day    Packs/day: 0.25    Years: 61.00    Pack years: 15.25    Types: Cigarettes   Smokeless tobacco: Never  Vaping Use   Vaping Use: Never used   Substance and Sexual Activity   Alcohol use: Not Currently    Comment: quit 2021   Drug use: Not Currently   Sexual activity: Not Currently  Other Topics Concern   Not on file  Social History Narrative   Not on file   Social Determinants of Health   Financial Resource Strain: Not on file  Food Insecurity: Not on file  Transportation Needs: Not on file  Physical Activity: Not on file  Stress: Not on file  Social Connections: Not on file  Intimate Partner Violence: Not on file    Family History:    Family History  Problem Relation Age of Onset   Hypertension Mother    Hypertension Father      ROS:  Please see the history of present illness.  Patient denies fevers, chills, productive cough. All other ROS reviewed and negative.     Physical Exam/Data:   Vitals:   10/31/21 0645 10/31/21 0745 10/31/21 0845 10/31/21 1209  BP: 125/69 (!) 144/71 129/78 131/70  Pulse: 72 68 74 65  Resp: 15 17 (!) 23 17  Temp:      TempSrc:      SpO2: 97% 97% 98% 95%  Height:       No intake or output data in the 24 hours ending 10/31/21 1250 Last 3 Weights 10/10/2021 09/30/2021 08/17/2021  Weight (lbs) 154 lb 14.4 oz 147 lb 11.3 oz 150 lb  Weight (kg) 70.262 kg 67 kg 68.04 kg     Body mass index is 24.26 kg/m.  General:  Well nourished, well developed, chronically ill-appearing in no acute distress HEENT: normal Neck: no JVD Vascular: No carotid bruits; Distal pulses 2+ bilaterally Cardiac:  normal S1, S2; RRR; no murmur  Lungs:  clear to auscultation bilaterally, no wheezing, rhonchi or rales  Abd: soft, nontender, no hepatomegaly  Ext: no edema Musculoskeletal:  No deformities, BUE and BLE strength normal and equal Skin: warm and dry  Neuro:  CNs 2-12 intact, no focal abnormalities noted Psych:  Normal affect   EKG:  The EKG was personally reviewed and demonstrates: Normal sinus rhythm, left ventricular hypertrophy and lateral T wave inversion. Telemetry:  Telemetry was  personally reviewed and demonstrates: Sinus rhythm with frequent PACs many of which are nonconducted; there is also evidence of atrial bigeminy with nonconducted PACs.  Laboratory Data:  High Sensitivity Troponin:   Recent Labs  Lab 10/30/21 1701 10/30/21 2016 10/30/21 2145  TROPONINIHS 146* 113* 125*     Chemistry Recent Labs  Lab 10/30/21 1638 10/30/21 1704 10/31/21 0107  NA 136 135  --   K 3.1* 3.1*  --   CL 94* 95*  --   CO2 28  --   --   GLUCOSE 83 89  --   BUN 8 9  --   CREATININE 4.83* 5.10*  --   CALCIUM 8.0*  --   --   MG  --   --  2.0  GFRNONAA 12*  --   --  ANIONGAP 14  --   --     Recent Labs  Lab 10/30/21 1638  PROT 7.8  ALBUMIN 3.2*  AST 16  ALT 6  ALKPHOS 85  BILITOT 0.6   Hematology Recent Labs  Lab 10/30/21 1638 10/30/21 1704  WBC 6.3  --   RBC 4.16*  --   HGB 11.2* 17.7*  HCT 38.0* 52.0  MCV 91.3  --   MCH 26.9  --   MCHC 29.5*  --   RDW 20.1*  --   PLT 246  --      Radiology/Studies:  DG Chest 2 View  Result Date: 10/30/2021 CLINICAL DATA:  Shortness of breath EXAM: CHEST - 2 VIEW COMPARISON:  Previous studies including the examination of 09/09/2021 FINDINGS: Cardiac size is within normal limits. There are no signs of pulmonary edema. There are linear densities in the left lower lung fields. There is blunting of left lateral CP angle. There is no pneumothorax. Tip of right IJ dialysis catheter is seen in the right atrium. There is vascular coil in the left axilla. IMPRESSION: There are linear densities in the left lower lung fields suggesting subsegmental atelectasis. Possible small left pleural effusion. There are no signs of alveolar pulmonary edema. Tip of dialysis catheter is seen in the right atrium. Electronically Signed   By: Elmer Picker M.D.   On: 10/30/2021 18:05   VAS [place] LOWER EXTREMITY VENOUS (DVT)  Result Date: 10/31/2021  Lower Venous DVT Study Patient Name:  Anthony [name]  Date of Exam:   10/31/2021 Medical Rec #:  [id number]       Accession #:    [id number] Date of Birth: January 20, 1941        Patient Gender: M Patient Age:   59 years Exam Location:  [facility] Procedure:      VAS [place] LOWER EXTREMITY VENOUS (DVT) Referring Phys: CHING TU --------------------------------------------------------------------------------  Indications: Pain.  Limitations: Veins shadowed out by calcifications, scarring in left calf. Comparison Study: No prior study on file Performing Technologist: Sharion Dove RVS  Examination Guidelines: A complete evaluation includes B-mode imaging, spectral Doppler, color Doppler, and power Doppler as needed of all accessible portions of each vessel. Bilateral testing is considered an integral part of a complete examination. Limited examinations for reoccurring indications may be performed as noted. The reflux portion of the exam is performed with the patient in reverse Trendelenburg.  +-----+---------------+---------+-----------+----------+--------------+  RIGHT Compressibility Phasicity Spontaneity Properties Thrombus Aging  +-----+---------------+---------+-----------+----------+--------------+  CFV   Full            Yes       Yes                                    +-----+---------------+---------+-----------+----------+--------------+   +---------+---------------+---------+-----------+----------+-------------------+  LEFT      Compressibility Phasicity Spontaneity Properties Thrombus Aging       +---------+---------------+---------+-----------+----------+-------------------+  CFV       Full            Yes       Yes                                         +---------+---------------+---------+-----------+----------+-------------------+  SFJ       Full                                                                  +---------+---------------+---------+-----------+----------+-------------------+  FV Prox   Full            Yes       Yes                                          +---------+---------------+---------+-----------+----------+-------------------+  FV Mid    Full                                                                  +---------+---------------+---------+-----------+----------+-------------------+  FV Distal Full            Yes       Yes                                         +---------+---------------+---------+-----------+----------+-------------------+  PFV       Full            Yes       Yes                                         +---------+---------------+---------+-----------+----------+-------------------+  POP       Full                                                                  +---------+---------------+---------+-----------+----------+-------------------+  PTV                                                        Not well visualized  +---------+---------------+---------+-----------+----------+-------------------+  PERO                                                       Not well visualized  +---------+---------------+---------+-----------+----------+-------------------+    Summary: RIGHT: - No evidence of common femoral vein obstruction.  LEFT: - There is no evidence of deep vein thrombosis in the lower extremity. However, portions of this examination were limited- see technologist comments above.  - No cystic structure found in the popliteal fossa.  *See table(s) above for measurements and observations.    Preliminary      Assessment and Plan:   Chest pain-patient has had difficulties with recurrent chest pain.  He presents with recurrent symptoms lasting approximately 3 hours.  His electrocardiogram shows no new T wave changes.  His troponin is minimally elevated but is chronically elevated in reviewing previous laboratories.  He had a cardiac catheterization in June that showed nonobstructive coronary disease as outlined.  Would  not pursue further ischemia evaluation. coronary artery disease-would resume home dose of aspirin at 81 mg daily  and statin. Hypertension-would resume carvedilol at preadmission dose at discharge and follow. Hyperlipidemia-given documented coronary disease would increase Lipitor to 80 mg daily.  Check lipids and liver in 8 weeks. History of paroxysmal atrial fibrillation-he is in sinus rhythm with nonconducted PACs.  Would continue carvedilol at discharge.  He is not on anticoagulation as he apparently has a history of recurrent GI bleeding.  This is documented in notes from [place]. End-stage renal disease-dialysis per nephrology.   Risk Assessment/Risk Scores:         :HEAR Score (for undifferentiated chest pain):  HEART Score: 6 CHA2DS2-VASc Score = 4  } This indicates a 4.8% annual risk of stroke. The patient's score is based upon: CHF History: 0 HTN History: 1 Diabetes History: 0 Stroke History: 0 Vascular Disease History: 1 Age Score: 2 Gender Score: [id number]}     [facility] will sign off.   Medication Recommendations: Continue preadmission medications (increase lipitor to 80 mg daily) Other recommendations (labs, testing, etc): Lipids and liver 8 weeks. Follow up as an outpatient: We will arrange follow-up with APP in 4 to 6 weeks.  For questions or updates, please contact [place] Please consult [email or web address] for contact info under    Signed, Kirk Ruths, MD  10/31/2021 12:50 PM

## 2021-10-31 NOTE — H&P (Signed)
History and Physical    Patient: Anthony [name] JQB:[id number] DOB: [birth year] DOA: 10/30/2021 DOS: the patient was seen and examined on 10/31/2021 PCP: Isaac Bliss, Rayford Halsted, MD  Patient coming from: Home  Chief Complaint:  Chief Complaint  Patient presents with   Chest Pain   Shortness of Breath    HPI: Anthony [name] is a 81 y.o. male with medical history significant of ESRD on HD MWF,latent TB, type 2 diabetes, chronic diastolic heart failure, COPD and dementia who presents with concerns of chest pain.  Patient was about 3-1/2 hours into dialysis today when he developed acute anterior chest pressure-like chest pain.  States it felt like "someone sitting on his chest."  Reports compliant with all of his medication although thinks he missed some last night.  Also has associated shortness of breath and left LE calf tenderness.  Patient is a current tobacco user of about a pack every 2 to 3 days.  Denies illicit drug use.  In the ED he was afebrile, initially document to be hypotensive and hypoxic which likely was erroneous.  His blood pressure and SPO2 remained normal while in the ER.  He continued have intermittent chest pain in the ED.  ED physician discussed with cardiology who recommended observation and will do formal consult in the morning.  No further recommendations at this time.  Troponin has mostly remained flat at 146 -->113 -->125 which is similar to his previous labs. EKG with inverted T waves to the inferior leads, LAFB and prolonged QTc 549.  Inverted T waves are similar to prior EKG. Creatinine at 4.83.  No leukocytosis, hemoglobin of 11.2.   Review of Systems: As mentioned in the history of present illness. All other systems reviewed and are negative. Past Medical History:  Diagnosis Date   Alcohol abuse 2021   Anemia    Arthritis    CHF (congestive heart failure) ([place])    Chicken pox    Chronic kidney disease    M W F   COVID 02/2021   Dementia (HCC)     mild   Dyspnea    ESRD (end stage renal disease) on dialysis [facility])    GI bleed    History of blood transfusion 03/2021   Hypertension    Insomnia    Mitral regurgitation 05/16/2021   Pneumonia    Positive QuantiFERON-TB Gold test 05/16/2021   Stroke ([place])    TB lung, latent 05/16/2021   Past Surgical History:  Procedure Laterality Date   AV FISTULA PLACEMENT Right 05/14/2021   Procedure: INSERTION OF ARTERIOVENOUS (AV) GORE-TEX GRAFT ARM;  Surgeon: Angelia Mould, MD;  Location: [facility] OR;  Service: Vascular;  Laterality: Right;   ENTEROSCOPY N/A 04/18/2021   Procedure: ENTEROSCOPY;  Surgeon: Jackquline Denmark, MD;  Location: [place];  Service: Endoscopy;  Laterality: N/A;   HOT HEMOSTASIS N/A 04/18/2021   Procedure: HOT HEMOSTASIS (ARGON PLASMA COAGULATION/BICAP);  Surgeon: Jackquline Denmark, MD;  Location: [facility] ENDOSCOPY;  Service: Endoscopy;  Laterality: N/A;   SUBMUCOSAL TATTOO INJECTION  04/18/2021   Procedure: SUBMUCOSAL TATTOO INJECTION;  Surgeon: Jackquline Denmark, MD;  Location: [facility] ENDOSCOPY;  Service: Endoscopy;;   Social History:  reports that he has been smoking cigarettes. He has a 15.25 pack-year smoking history. He has never used smokeless tobacco. He reports that he does not currently use alcohol. He reports that he does not currently use drugs.  No Known Allergies  Family History  Problem Relation Age of Onset   Hypertension Mother  Hypertension Father     Prior to Admission medications   Medication Sig Start Date End Date Taking? Authorizing Provider  ADVAIR HFA [phone] MCG/ACT inhaler INHALE 2 PUFFS INTO THE LUNGS IN THE MORNING AND AT BEDTIME 01/17/21   Isaac Bliss, Rayford Halsted, MD  albuterol (PROVENTIL HFA) 108 (90 Base) MCG/ACT inhaler Inhale 2 puffs into the lungs every 4 (four) hours as needed for wheezing or shortness of breath. 03/08/20   Laurey Morale, MD  ASPIRIN LOW DOSE 81 MG chewable tablet Chew 81 mg by mouth daily. 03/05/21   [provider]   atorvastatin (LIPITOR) 20 MG tablet TAKE 1 TABLET BY MOUTH EVERYDAY AT BEDTIME Patient taking differently: Take 20 mg by mouth daily. 07/30/20   Isaac Bliss, Rayford Halsted, MD  budesonide-formoterol [facility]) 160-4.5 MCG/ACT inhaler Inhale 2 puffs into the lungs daily. 04/12/21   [provider]  calcium acetate (PHOSLO) 667 MG capsule Take 667 mg by mouth 3 (three) times daily with meals.    [provider]  carboxymethylcellulose (REFRESH PLUS) 0.5 % SOLN Place 1 drop into both eyes daily as needed (dry eyes). 12/07/20   [provider]  carvedilol (COREG) 6.25 MG tablet Take 1 tablet (6.25 mg total) by mouth 2 (two) times daily with a meal. 03/02/20   Black, Lezlie Octave, NP  hydrALAZINE (APRESOLINE) 50 MG tablet Take 50 mg by mouth every 8 (eight) hours.    [provider]  isoniazid (NYDRAZID) 300 MG tablet TAKE 1 TABLET BY MOUTH EVERY DAY 07/26/21   Tommy Medal, Lavell Islam, MD  isosorbide mononitrate (IMDUR) 30 MG 24 hr tablet Take 30 mg by mouth daily. 04/02/21   [provider]  lidocaine-prilocaine (EMLA) cream Apply 1 application topically daily as needed (port access).    [provider]  memantine (NAMENDA) 5 MG tablet Take 5 mg by mouth daily. 03/12/21   [provider]  nicotine (NICODERM CQ - DOSED IN MG/24 HOURS) 14 mg/24hr patch Place 1 patch (14 mg total) onto the skin daily. 03/15/20   Isaac Bliss, Rayford Halsted, MD  pantoprazole (PROTONIX) 40 MG tablet Take 1 tablet (40 mg total) by mouth daily. 04/24/21   Mariel Aloe, MD  pyridOXINE (VITAMIN B-6) 50 MG tablet Take 1 tablet (50 mg total) by mouth daily. 05/16/21 02/10/22  Truman Hayward, MD  Tiotropium Bromide Monohydrate (SPIRIVA RESPIMAT) 2.5 MCG/ACT AERS Inhale 2 puffs into the lungs daily. 03/15/20   Isaac Bliss, Rayford Halsted, MD  traZODone (DESYREL) 50 MG tablet Take 1-2 tablets (50-100 mg total) by mouth at bedtime as needed. Patient taking differently: Take 50-100 mg  by mouth at bedtime as needed for sleep. 06/12/21   Isaac Bliss, Rayford Halsted, MD    Physical Exam: Vitals:   10/30/21 2317 10/30/21 2330 10/30/21 2345 10/31/21 0000  BP:  127/73 117/68 111/67  Pulse: 76  79 69  Resp: 16 17 14 16   Temp:      TempSrc:      SpO2: 96% 96% 97% 96%  Height:       Constitutional: NAD, calm, comfortable, elderly male laying flat in bed Eyes: PERRL, lids and conjunctivae normal ENMT: Mucous membranes are moist.  Neck: normal, supple Respiratory: clear to auscultation bilaterally, no wheezing, no crackles. Normal respiratory effort. No accessory muscle use.  Cardiovascular: Regular rate and rhythm, no murmurs / rubs / gallops. No extremity edema. Perm cath HD site on right anterior chest. Abdomen: no tenderness, no masses palpated.  Bowel sounds positive.  Musculoskeletal: no clubbing / cyanosis. No joint deformity upper and lower extremities. Good ROM  Skin: no rashes, lesions, ulcers.  Neurologic: CN 2-12 grossly intact. Strength 5/5 in all 4.  Psychiatric: Normal judgment and insight. Alert and oriented x 3. Normal mood.  Data Reviewed: EKG with inverted inferior T waves similar but more pronounced previous EKG.  LAFB and prolonged Qtc.  Assessment and Plan:  Chest pain  Troponin has mostly remained flat at 146 -->113 -->125 which is similar to his previous labs. Suspect chronically elevated due to ESRD. Currently chest pain free at my evaluation. Suspect pain could be due to existing CAD and transient hypotension causing demand during dialysis. -EKG similar to prior although with more pronounced inverted inferior T waves.  LAFB and prolonged QTc. -Last echocardiogram on 03/2021 with EF of 50 to 55% and grade 2 diastolic dysfunction.  No significant valvular abnormalities. -Last myocardial perfusion study on 02/2020 shows small defect of moderate severity in the apical inferior location.  Finding consistent with prior MI.  Intermediate risk study due to  moderate systolic dysfunction with a EF of 39%. -need formal cardiology consult in the morning -venous doppler to rule out DVT of left LE due to symptom of cramping although likely due to hypokalemia   Hypokalemia  K of 3.1. replete with oral K. Check Mg  Prolonged Qtc 549 Replete electrolyte as above - Avoid QT prolongation medication  ESRD on HD MWF -did not complete full session today due to chest pain but creatinine is stable.   Latent TB Resume meds once med rec is verified  Paroxysmal atrial fibrillation Rate controlled.  Not on anticoagulation due to recurrent GI bleeds.  Chronic diastolic heart failure -Appears compensated   Advance Care Planning:   Code Status: Full Code   Consults: needs cardiology consult  Family Communication: no family at bedside   Severity of Illness: The appropriate patient status for this patient is OBSERVATION. Observation status is judged to be reasonable and necessary in order to provide the required intensity of service to ensure the patient's safety. The patient's presenting symptoms, physical exam findings, and initial radiographic and laboratory data in the context of their medical condition is felt to place them at decreased risk for further clinical deterioration. Furthermore, it is anticipated that the patient will be medically stable for discharge from the hospital within 2 midnights of admission.   Author: Orene Desanctis, DO 10/31/2021 12:27 AM  For on call review [email or web address].

## 2021-11-01 DIAGNOSIS — R072 Precordial pain: Secondary | ICD-10-CM | POA: Diagnosis not present

## 2021-11-01 DIAGNOSIS — J42 Unspecified chronic bronchitis: Secondary | ICD-10-CM | POA: Diagnosis not present

## 2021-11-01 DIAGNOSIS — Z992 Dependence on renal dialysis: Secondary | ICD-10-CM | POA: Diagnosis not present

## 2021-11-01 DIAGNOSIS — M79606 Pain in leg, unspecified: Secondary | ICD-10-CM

## 2021-11-01 DIAGNOSIS — R0600 Dyspnea, unspecified: Secondary | ICD-10-CM

## 2021-11-01 DIAGNOSIS — I5032 Chronic diastolic (congestive) heart failure: Secondary | ICD-10-CM | POA: Diagnosis present

## 2021-11-01 DIAGNOSIS — N186 End stage renal disease: Secondary | ICD-10-CM | POA: Diagnosis present

## 2021-11-01 DIAGNOSIS — F0390 Unspecified dementia without behavioral disturbance: Secondary | ICD-10-CM | POA: Diagnosis present

## 2021-11-01 DIAGNOSIS — Z20822 Contact with and (suspected) exposure to covid-19: Secondary | ICD-10-CM | POA: Diagnosis present

## 2021-11-01 DIAGNOSIS — I2510 Atherosclerotic heart disease of native coronary artery without angina pectoris: Secondary | ICD-10-CM | POA: Diagnosis present

## 2021-11-01 DIAGNOSIS — I480 Paroxysmal atrial fibrillation: Secondary | ICD-10-CM | POA: Diagnosis present

## 2021-11-01 DIAGNOSIS — I132 Hypertensive heart and chronic kidney disease with heart failure and with stage 5 chronic kidney disease, or end stage renal disease: Secondary | ICD-10-CM | POA: Diagnosis present

## 2021-11-01 DIAGNOSIS — J449 Chronic obstructive pulmonary disease, unspecified: Secondary | ICD-10-CM | POA: Diagnosis present

## 2021-11-01 DIAGNOSIS — M79605 Pain in left leg: Secondary | ICD-10-CM | POA: Diagnosis not present

## 2021-11-01 DIAGNOSIS — G4700 Insomnia, unspecified: Secondary | ICD-10-CM | POA: Diagnosis present

## 2021-11-01 DIAGNOSIS — E785 Hyperlipidemia, unspecified: Secondary | ICD-10-CM | POA: Diagnosis present

## 2021-11-01 DIAGNOSIS — T8089XA Other complications following infusion, transfusion and therapeutic injection, initial encounter: Secondary | ICD-10-CM | POA: Diagnosis present

## 2021-11-01 DIAGNOSIS — M1990 Unspecified osteoarthritis, unspecified site: Secondary | ICD-10-CM | POA: Diagnosis present

## 2021-11-01 DIAGNOSIS — Z8616 Personal history of COVID-19: Secondary | ICD-10-CM | POA: Diagnosis not present

## 2021-11-01 DIAGNOSIS — F17210 Nicotine dependence, cigarettes, uncomplicated: Secondary | ICD-10-CM | POA: Diagnosis present

## 2021-11-01 DIAGNOSIS — D638 Anemia in other chronic diseases classified elsewhere: Secondary | ICD-10-CM

## 2021-11-01 DIAGNOSIS — R079 Chest pain, unspecified: Secondary | ICD-10-CM | POA: Diagnosis present

## 2021-11-01 DIAGNOSIS — E876 Hypokalemia: Secondary | ICD-10-CM

## 2021-11-01 DIAGNOSIS — D631 Anemia in chronic kidney disease: Secondary | ICD-10-CM | POA: Diagnosis present

## 2021-11-01 DIAGNOSIS — Y841 Kidney dialysis as the cause of abnormal reaction of the patient, or of later complication, without mention of misadventure at the time of the procedure: Secondary | ICD-10-CM | POA: Diagnosis present

## 2021-11-01 DIAGNOSIS — I953 Hypotension of hemodialysis: Secondary | ICD-10-CM | POA: Diagnosis present

## 2021-11-01 DIAGNOSIS — E878 Other disorders of electrolyte and fluid balance, not elsewhere classified: Secondary | ICD-10-CM | POA: Diagnosis present

## 2021-11-01 DIAGNOSIS — R0789 Other chest pain: Secondary | ICD-10-CM | POA: Diagnosis present

## 2021-11-01 DIAGNOSIS — E1122 Type 2 diabetes mellitus with diabetic chronic kidney disease: Secondary | ICD-10-CM | POA: Diagnosis present

## 2021-11-01 LAB — CBC
HCT: 37.3 % — ABNORMAL LOW (ref 39.0–52.0)
HCT: 36.2 % — ABNORMAL LOW (ref 39.0–52.0)
Hemoglobin: 11.3 g/dL — ABNORMAL LOW (ref 13.0–17.0)
Hemoglobin: 10.9 g/dL — ABNORMAL LOW (ref 13.0–17.0)
MCH: 27.2 pg (ref 26.0–34.0)
MCH: 27.7 pg (ref 26.0–34.0)
MCHC: 30.3 g/dL (ref 30.0–36.0)
MCHC: 30.1 g/dL (ref 30.0–36.0)
MCV: 89.9 fL (ref 80.0–100.0)
MCV: 91.9 fL (ref 80.0–100.0)
Platelets: 267 10*3/uL (ref 150–400)
Platelets: 287 10*3/uL (ref 150–400)
RBC: 4.15 MIL/uL — ABNORMAL LOW (ref 4.22–5.81)
RBC: 3.94 MIL/uL — ABNORMAL LOW (ref 4.22–5.81)
RDW: 20 % — ABNORMAL HIGH (ref 11.5–15.5)
RDW: 19.9 % — ABNORMAL HIGH (ref 11.5–15.5)
WBC: 4.8 10*3/uL (ref 4.0–10.5)
WBC: 5.1 10*3/uL (ref 4.0–10.5)
nRBC: 0 % (ref 0.0–0.2)
nRBC: 0 % (ref 0.0–0.2)

## 2021-11-01 LAB — GLUCOSE, CAPILLARY: Glucose-Capillary: 87 mg/dL (ref 70–99)

## 2021-11-01 LAB — HEPATITIS B SURFACE ANTIBODY,QUALITATIVE: Hep B S Ab: NONREACTIVE

## 2021-11-01 LAB — HEPATITIS B SURFACE ANTIGEN: Hepatitis B Surface Ag: NONREACTIVE

## 2021-11-01 LAB — HEPATITIS B CORE ANTIBODY, TOTAL: Hep B Core Total Ab: REACTIVE — AB

## 2021-11-01 MED ORDER — ATORVASTATIN CALCIUM 80 MG PO TABS
80.0000 mg | ORAL_TABLET | Freq: Every day | ORAL | Status: DC
  Administered 2021-11-02 – 2021-11-05 (×4): 80 mg via ORAL
  Filled 2021-11-01 (×4): qty 1

## 2021-11-01 MED ORDER — SODIUM CHLORIDE 0.9 % IV SOLN: 100.0000 mL | INTRAVENOUS | Status: DC | PRN

## 2021-11-01 MED ORDER — VITAMIN B-6 50 MG PO TABS
50.0000 mg | ORAL_TABLET | Freq: Every day | ORAL | Status: DC
  Administered 2021-11-02 – 2021-11-05 (×4): 50 mg via ORAL
  Filled 2021-11-01 (×4): qty 1

## 2021-11-01 MED ORDER — PENTAFLUOROPROP-TETRAFLUOROETH EX AERO: 1.0000 "application " | INHALATION_SPRAY | CUTANEOUS | Status: DC | PRN

## 2021-11-01 MED ORDER — LIDOCAINE-PRILOCAINE 2.5-2.5 % EX CREA
1.0000 "application " | TOPICAL_CREAM | CUTANEOUS | Status: DC | PRN
  Filled 2021-11-01: qty 5

## 2021-11-01 MED ORDER — ISONIAZID 300 MG PO TABS
300.0000 mg | ORAL_TABLET | Freq: Every day | ORAL | Status: DC
  Administered 2021-11-02 – 2021-11-05 (×4): 300 mg via ORAL
  Filled 2021-11-01 (×4): qty 1

## 2021-11-01 MED ORDER — UMECLIDINIUM BROMIDE 62.5 MCG/ACT IN AEPB
1.0000 | INHALATION_SPRAY | Freq: Every day | RESPIRATORY_TRACT | Status: DC
  Administered 2021-11-02 – 2021-11-05 (×3): 1 via RESPIRATORY_TRACT
  Filled 2021-11-01: qty 7

## 2021-11-01 MED ORDER — ALBUTEROL SULFATE (2.5 MG/3ML) 0.083% IN NEBU: 2.5000 mg | INHALATION_SOLUTION | RESPIRATORY_TRACT | Status: DC | PRN

## 2021-11-01 MED ORDER — ASPIRIN EC 81 MG PO TBEC
81.0000 mg | DELAYED_RELEASE_TABLET | Freq: Every day | ORAL | Status: DC
  Administered 2021-11-01 – 2021-11-05 (×5): 81 mg via ORAL
  Filled 2021-11-01 (×5): qty 1

## 2021-11-01 MED ORDER — PANTOPRAZOLE SODIUM 40 MG PO TBEC
40.0000 mg | DELAYED_RELEASE_TABLET | Freq: Every day | ORAL | Status: DC
  Administered 2021-11-01 – 2021-11-05 (×5): 40 mg via ORAL
  Filled 2021-11-01 (×5): qty 1

## 2021-11-01 MED ORDER — LIDOCAINE HCL (PF) 1 % IJ SOLN: 5.0000 mL | INTRAMUSCULAR | Status: DC | PRN

## 2021-11-01 MED ORDER — FLUTICASONE FUROATE-VILANTEROL 200-25 MCG/ACT IN AEPB
1.0000 | INHALATION_SPRAY | Freq: Every day | RESPIRATORY_TRACT | Status: DC
  Administered 2021-11-02 – 2021-11-05 (×3): 1 via RESPIRATORY_TRACT
  Filled 2021-11-01: qty 28

## 2021-11-01 MED ORDER — ALTEPLASE 2 MG IJ SOLR
2.0000 mg | Freq: Once | INTRAMUSCULAR | Status: DC | PRN
  Filled 2021-11-01: qty 2

## 2021-11-01 MED ORDER — HEPARIN SODIUM (PORCINE) 1000 UNIT/ML DIALYSIS
1000.0000 [IU] | INTRAMUSCULAR | Status: DC | PRN
  Filled 2021-11-01 (×2): qty 1

## 2021-11-01 NOTE — Assessment & Plan Note (Addendum)
-  Continue with Memantine 5 mg p.o. daily

## 2021-11-01 NOTE — Assessment & Plan Note (Addendum)
-  Continue telemetry monitoring and patient is rate controlled is not on anticoagulation due to recurrent GI bleeds -HR is ranging from 69-77

## 2021-11-01 NOTE — Assessment & Plan Note (Signed)
-  Resume home isoniazid 300 mg p.o. daily as well as home pyridoxine 50 mg p.o. daily

## 2021-11-01 NOTE — Assessment & Plan Note (Addendum)
-  Was dyspneic during his dialysis session as they feel that he may have had too much fluid pulled off of them -continue supplemental oxygen as needed and for some reason he was back on it and not complaining of SOB -SpO2: 93 % O2 Flow Rate (L/min): 2 L/min; Wean to Room Air and no longer wearing Supplemental O2 via [place] -Dyspnea resolved but was wearing Supplemental O2 and his [place] was not in his nose -CXR today showed "Stable exam. No acute cardiopulmonary findings."

## 2021-11-01 NOTE — Consult Note (Signed)
° °  [facility] Inpatient Consult   11/01/2021  Anthony [name] 05/31/1941 [id number]  [place]  Accountable Care Organization [ACO] Patient: Medicare   Primary Care Provider:  Isaac Bliss, Rayford Halsted, MD, [facility] is an embedded provider with a Chronic Care Management team and program, and is listed for the transition of care follow up and appointments.  Patient was screened for Embedded practice service needs for chronic care management and found that patient has been referred for CCM services and active with CCM social worker.  Patient is not currently showing on on the [facility] banner. Patient is currently in observation status noted. Came by to see the patient and he was being worked with care team.    Plan:  Notification to be sent to the [place] Management team member and will update on TOC needs for post hospital needs, if appropriate pending disposition.  Please contact for further questions,  Natividad Brood, RN BSN [facility] Liaison  [phone] business mobile phone Toll free office [phone]  Fax number: [phone] [email or web address]

## 2021-11-01 NOTE — Plan of Care (Signed)
Problem: Pain Managment: °Goal: General experience of comfort will improve °Outcome: Completed/Met °  °Problem: Coping: °Goal: Level of anxiety will decrease °Outcome: Completed/Met °  °

## 2021-11-01 NOTE — Significant Event (Signed)
Rapid Response Event Note   Reason for Call : Hypothermic   Initial Focused Assessment: Pt awake in HD interacting with staff. HD treatment had ended and HD RN noted him to be cold to touch and diaphoretic. Pt endorses that this happened after previous treatment.  He is alert and oriented x3 (dementia at baseline per HD RN). Skin is cool and dry. He has no complaint of chest pain and no complaint of shortness of breath. Abdomen is soft and non-tender. Lungs have scattered rhonchi. Pt on 5L [place] of oxygen (RA prior to event). Palpable pulses throughout.   VS: BP 113/54, HR 78, RR 28, T 98.8 rectal (unable to get temp orally or axillary). Unable to get pulse ox in multiple locations. CBG: 87  Pt has 1.2L removed. 500cc of fluid given back by HD RN.  Interventions:  -ABG per MD  Plan of Care:  -Once ABG has resulted, titrate oxygen needs as able. -Call RRN if additional support is needed.  Event Summary:   MD Notified: Dr. Jonnie Finner Call Time: 6381 Arrival Time: 7711 End Time: 1147  Fulton Reek, RN

## 2021-11-01 NOTE — Assessment & Plan Note (Addendum)
-  Nephrology consulted for further evaluation and maintenance of hemodialysis -After further discussion with nephrology they feel that they pulled too much fluid off of him this morning and had to give him 500 mL back.  He was 3 kg under his dry weight -Further care per nephrology and his usual hemodialysis treatment was cut short due to his cramping and hypotension as well as dyspnea; Next dialysis session scheduled for 11/04/21 as he awaits SNF placement -Nephrology recommends continue home binders and calcitriol  -Patient's BUN/creatinine went from 9/5.10 -> 22/8.79 -> 24/8.63 -> 40/11.02 -> 56/12.82 -Continue to monitor trend renal function carefully and repeat CMP within 1 week

## 2021-11-01 NOTE — TOC Progression Note (Addendum)
Transition of Care [facility]) - Progression Note    Patient Details  Name: Tevan Marian MRN: [id number] Date of Birth: 21-Apr-1941  [facility] CM/SW Contact  Zenon Mayo, RN Phone Number: 11/01/2021, 11:57 AM  Clinical Narrative:    Patient is from home with wife, he has a walker, he has PCP , Dr. Jerilee Hoh, he states he can ride in a car.  Awaiting pt eval. Rapid called.   2/10- NCM spoke with wife, she states she would like for patient to go to SNF, and patient is in agreement, NCM notified CSW. Wife unable to care for patient.         Expected Discharge Plan and Services                                                 Social Determinants of Health (SDOH) Interventions    Readmission Risk Interventions Readmission Risk Prevention Plan 04/24/2021  Transportation Screening Complete  PCP or Specialist Appt within 3-5 Days Complete  HRI or [place] Complete  Social Work Consult for [place] Planning/Counseling Complete  Palliative Care Screening Not Applicable  Medication Review [employer]) Referral to Pharmacy

## 2021-11-01 NOTE — Assessment & Plan Note (Addendum)
-  Continue with Symbicort substitution with Breo Ellipta and also continue Spiriva Respimat and Albuterol Inhaler 2 puffs into lungs every 4 hours as needed -Continue to Monitor Respiratory Status carefully

## 2021-11-01 NOTE — Assessment & Plan Note (Addendum)
-   Patient's hemoglobin/hematocrit went from 10.9/37.5 -> 11.3/37.3 -> 10.9/36.2 -> 11.4/37.4 -> 11.0/36.7 -> 9.9/32.7 -Check Anemia Panel and showed an iron level of 24, UIBC is 189, TIBC 213, saturation which is 11%, ferritin levels 100, folate was 5.7, vitamin B12 level was 313 -Continue to monitor for signs and symptoms bleeding; currently no overt bleeding noted -Repeat CBC within 1 week

## 2021-11-01 NOTE — Progress Notes (Signed)
Progress Note   Patient: Anthony [name] VPX:[id number] DOB: 1940-11-15 DOA: 10/30/2021     0 DOS: the patient was seen and examined on 11/01/2021   Brief hospital course: The patient is a 81 year old elderly African-American male with past medical history significant for but not to end-stage renal disease on hemodialysis, Monday Wednesday, Friday, history of latent TB, diabetes mellitus type 2, history of chronic diastolic CHF, history of COPD as well as dementia and other comorbidities who presented with chest pain after his dialysis session.  He was about 3 and half hours into his dialysis session on 10/30/21 when he felt an acute anterior chest like pressure and felt as if "somebody is sitting on his chest".  He also stated some dyspnea and shortness of breath as well as having some left lower extremity calf tenderness.  He reports compliance with his medications and was a current tobacco user and smokes a pack every 2 to 3 days.  In the ED he was initially documented hypotensive and hypoxic but his blood pressure was erroneously documented it was normal while he was in the ED.  Continued to have intermittent chest pain in the ED in the ED physician discussed with cardiology who recommended observation.  Troponin was flat at 146 and trended down to 125.  EKG was showing some inverted T waves that were similar to priors.  His BUN and creatinine were a little elevated.  He was admitted for his chest pain and cardiology evaluated and an echocardiogram was done.  Cardiology felt that his chest pain was not indicative of ACS given that he had a cardiac catheterization in June that showed nonobstructive coronary disease and they recommended no pursuit of ischemic evaluation but recommended resuming aspirin 81 mg p.o. daily as well as statin.  PT OT evaluated recommending home health however family feels that they cannot take care of the patient so a SNF work-up was been initiated.  Patient was dialyzed today and had  similar symptoms and had some dyspnea and abdominal tenderness during his dialysis session and it was felt that he had too much fluid pulled during his hemodialysis and they cut his treatment short due to his cramping and hypotension and they stopped his ultrafiltration with hemodialysis.  Subsequently nephrology gave the patient back 500 cc of fluid and he felt better.  Assessment and Plan: * Chest pain- (present on admission) -Had Chest Pain/Discomfort with Dyspnea after Dialysis  -Troponin went from 146 -> 113 -> 125 and similar to previous labs and suspected chronically due to ESRD -Was Chest Pain free -EKG similar to prior with inverted inferior T waves -Had a Mycardio perfusion study on 6/21 which showed a small defect of mderate severity in the apical inferior location -ECHO in 7/22 showed EF of 50-55% and Grade 2 DD with no significant valvular abnormalities -Cardiology consulted and he was evaluated for his recurrent chest pain and his symptoms and troponins were chronically elevated.  Patient had a cardiac catheterization back in June which showed nonobstructive coronary disease and a cardiology team recommended no further ischemic evaluation recommended continue home dose of aspirin 81 mg and increase the statin to 80 mg p.o. daily -Cardiology recommended checking lipids and liver panel in 8 weeks and recommended no further testing -Echocardiogram done and showed EF 50-55% and the Left Ventricl had no wall motion abnormalities and there is moderate concentric left ventricular hypertrophy and the ventricular diastolic parameters were consistent with grade 1 diastolic dysfunction.  Patient had severe MAC with  protrusion with calcification on the annulus and into the left atrium and the cardiac echocardiogram showed no real significant change from prior study -Follow up with the Cardiology team in 4 to 6 weeks  Leg pain -LE Duplex done and showed "There is no evidence of deep vein thrombosis  in the lower extremity. However, portions of this examination were limited- see technologist comments above."   Hypokalemia -Patient is K+ was 3.1 on admission and improved to 3.8 -Continue monitor and replete as necessary mag level is now 2.2 -Repeat CMP in a.m.  Dyspnea -Was dyspneic during his dialysis session today as they feel that he may have had too much fluid pulled off of them -continue supplemental oxygen as needed  TB lung, latent- (present on admission) -Resume home isoniazid 300 mg p.o. daily as well as home pyridoxine 50 mg p.o. daily  Dementia without behavioral disturbance ([place])- (present on admission) - Continue with memantine 5 mg p.o. daily  Prolonged QT interval- (present on admission) - He had prolonged QTc of 549 admission -Replating electrolytes -Avoiding QT prolongation medications  COPD (chronic obstructive pulmonary disease) ([place])- (present on admission) - Continue with Symbicort substitution with Breo Ellipta and also continue Spiriva Respimat and albuterol inhaler 2 puffs into lungs every 4 hours as needed  PAF (paroxysmal atrial fibrillation) ([place])- (present on admission) - Continue telemetry monitoring and patient is rate controlled is not on anticoagulation due to recurrent GI bleeds  Chronic diastolic heart failure ([place])- (present on admission) - Repeat echocardiogram showed diastolic dysfunction consistent with grade 1 -Appears currently compensated and he has dialysis for volume maintenance -Nephrology likely dialyzed too much and had too much ultrafiltration so they given back 500 mL today -Strict I's and O's and daily weights  Anemia of chronic disease- (present on admission) - Patient's hemoglobin/hematocrit went from 10.9/37.5 -> 11.3/37.3 -> 10.9/36.2 -Check anemia panel in AM. -Continue to monitor for signs and symptoms bleeding; currently no overt bleeding noted -Repeat CBC in a.m.   ESRD (end stage renal disease) on dialysis [facility]) -  Nephrology consulted for further evaluation and maintenance of hemodialysis -After further discussion with nephrology they feel that they pulled too much fluid off of him this morning and had to give him 500 mL back.  He is 3 kg under his dry weight -Further care per nephrology and his usual hemodialysis treatment was cut short due to his cramping and hypotension as well as dyspnea -Nephrology recommends continue home binders and calcitriol  -Patient's BUN/creatinine went from 9/5.10 is now 22/8.79 -Continue to monitor trend renal function carefully and repeat CMP   Subjective: Seen and examined at bedside in the dialysis unit and he looked very uncomfortable and felt very dyspneic and complained of cramping abdominal pain.  I spoke with nephrology who felt that they may have pulled off too much fluid during his dialysis session and they cut his dialysis short stopped his ultrafiltration and given some fluid back.  PT OT recommending home health however wife feels that she cannot take care of him so social worker been consulted for assistance with SNF placement.  Physical Exam: Vitals:   11/01/21 1000 11/01/21 1133 11/01/21 1149 11/01/21 1619  BP: (!) 103/52 (!) 104/58  (!) 148/102  Pulse:  70  80  Resp:  16  20  Temp:  97.8 F (36.6 C)  97.9 F (36.6 C)  TempSrc:  Oral  Oral  SpO2:   93% 96%  Weight:      Height:  Examination: Physical Exam:  Constitutional: Thin chronically ill-appearing African-American male who appears uncomfortable and dyspneic Respiratory: Diminished to auscultation bilaterally with coarse breath sounds and he is tachypneic and has some dry crackles.  No appreciable wheezing or rhonchi.,  Wearing supplemental oxygen nasal cannula Cardiovascular: Tachycardic rate, no murmurs / rubs / gallops. S1 and S2 auscultated. No extremity edema. Abdomen: Soft, a little tender-tender, non-distended. Bowel sounds positive.  GU: Deferred. Psychiatric: Extremely  anxious  Data Reviewed:  I have independently reviewed and interpreted patient's CMP, CBC  Patient has a anemia of chronic disease and his hemoglobin/hematocrit is relatively stable from yesterday now 10.9/36.2 and his BUN/creatinine is elevated at 22/8.79  Family Communication: No family present at bedside  Disposition: Status is: Inpatient Remains inpatient appropriate because: Will need SNF placement and to make sure he is not Dyspneic    Planned Discharge Destination: Skilled nursing facility  DVT Prophylaxis: Heparin 5,000 units   Author: Raiford Noble, DO [facility]  11/01/2021 6:09 PM  For on call review [email or web address].

## 2021-11-01 NOTE — Evaluation (Addendum)
Physical Therapy Evaluation Patient Details Name: Anthony [name] MRN: [id number] DOB: [birth year] Today's Date: 11/01/2021  History of Present Illness  The pt is a 81 yo male presenting 2/8 with SOB and chest pain during HD. Work up with no acute fidings, cardiology consulted and signed off. PMH includes: alcohol use, anemia, CHF, CKD on HD MWF, mild dementia, HTN, CVA, and latent TB.   Clinical Impression  Pt in bed upon arrival of PT, agreeable to evaluation at this time. Prior to admission the pt reports he was mobilizing without need for DME or assist, states family provides transportation to HD sessions. Pt reports x2 recent falls with no substantial injury. The pt now presents with limitations in functional mobility, stability, and activity tolerance due to above dx, and will continue to benefit from skilled PT to address these deficits. The pt was able to complete bed mobility without assist and complete repeated sit-stand transfers from EOB without need for UE support or physical assist to steady. He self-limited mobility to transfer to recliner and back to bed due to reports he is scared it will become difficult for him to breathe again (rapid response called for this earlier this morning). The pt demos good power in his LE and was in no distress with this mobility (repeated sit-stands and transfer to recliner) on RA. Although the pt declined to progress ambulation, suspect he would be able to complete household distances with family supervision, recommend HHPT to progress endurance and stability. Per chart review, family has expressed concerns about taking care of the pt at home, therefore ALF or memory care may be appropriate to pursue after d/c.     After additional discussion with CM, the pt and his family would prefer he d/c to SNF. Since family feels they cannot provide appropriate level of assist/support, a short stint SNF would benefit.    Recommendations for follow up therapy are one  component of a multi-disciplinary discharge planning process, led by the attending physician.  Recommendations may be updated based on patient status, additional functional criteria and insurance authorization.  Follow Up Recommendations Home health PT (family requesting SNF due to "inability to care for the pt")    Assistance Recommended at Discharge Intermittent Supervision/Assistance  Patient can return home with the following  A little help with walking and/or transfers;Assistance with cooking/housework;Direct supervision/assist for financial management;Direct supervision/assist for medications management;Assist for transportation;Help with stairs or ramp for entrance    Equipment Recommendations None recommended by PT  Recommendations for Other Services       Functional Status Assessment Patient has had a recent decline in their functional status and demonstrates the ability to make significant improvements in function in a reasonable and predictable amount of time.     Precautions / Restrictions Precautions Precautions: Fall Restrictions Weight Bearing Restrictions: No      Mobility  Bed Mobility Overal bed mobility: Independent             General bed mobility comments: no increased time, no assist    Transfers Overall transfer level: Needs assistance Equipment used: None Transfers: Sit to/from Stand, Bed to chair/wheelchair/BSC Sit to Stand: Supervision   Step pivot transfers: Supervision       General transfer comment: pt able to complete x3 sit-stand without use of UE and with no change in VS, small steps to recliner with good stability, but does reach for single UE support    Ambulation/Gait Ambulation/Gait assistance: Supervision Gait Distance (Feet): 3 Feet Assistive device: None Gait  Pattern/deviations: Step-to pattern       General Gait Details: small steps from EOB to recliner and back, pt declined to ambulate further due to fear of SOB         Balance Overall balance assessment: Mild deficits observed, not formally tested                                           Pertinent Vitals/Pain Pain Assessment Pain Assessment: No/denies pain    Home Living Family/patient expects to be discharged to:: Private residence Living Arrangements: Spouse/significant other Available Help at Discharge: Family Type of Home: Mobile home Home Access: Stairs to enter Entrance Stairs-Rails: Left Entrance Stairs-Number of Steps: 6   Home Layout: One level Home Equipment: Rolling Walker (2 wheels);Grab bars - toilet;Grab bars - tub/shower Additional Comments: wears reading glasses    Prior Function Prior Level of Function : Independent/Modified Independent;History of Falls (last six months) (x2 falls down stairs)             Mobility Comments: pt reports stable without use of RW ADLs Comments: pt reports independence     Hand Dominance   Dominant Hand: Right    Extremity/Trunk Assessment   Upper Extremity Assessment Upper Extremity Assessment: Defer to OT evaluation    Lower Extremity Assessment Lower Extremity Assessment: Overall WFL for tasks assessed    Cervical / Trunk Assessment Cervical / Trunk Assessment: Kyphotic  Communication   Communication: HOH  Cognition Arousal/Alertness: Awake/alert Behavior During Therapy: WFL for tasks assessed/performed Overall Cognitive Status: History of cognitive impairments - at baseline                                          General Comments General comments (skin integrity, edema, etc.): VSS on RA, unable to get reliable pleth on O2 but pt in no distress and reports no SOB on RA    Exercises     Assessment/Plan    PT Assessment Patient needs continued PT services  PT Problem List Decreased strength;Decreased activity tolerance;Decreased balance;Decreased mobility;Decreased safety awareness;Cardiopulmonary status limiting activity        PT Treatment Interventions DME instruction;Gait training;Stair training;Functional mobility training;Therapeutic activities;Therapeutic exercise;Balance training;Patient/family education    PT Goals (Current goals can be found in the Care Plan section)  Acute Rehab PT Goals Patient Stated Goal: to figure out why he is getting SOB PT Goal Formulation: With patient Time For Goal Achievement: 11/15/21 Potential to Achieve Goals: Good    Frequency Min 3X/week     Co-evaluation PT/OT/SLP Co-Evaluation/Treatment: Yes Reason for Co-Treatment: Other (comment) (pt with rapid responce called earlier this morning, caution for safety) PT goals addressed during session: Mobility/safety with mobility;Balance;Strengthening/ROM         AM-PAC PT "6 Clicks" Mobility  Outcome Measure Help needed turning from your back to your side while in a flat bed without using bedrails?: None Help needed moving from lying on your back to sitting on the side of a flat bed without using bedrails?: None Help needed moving to and from a bed to a chair (including a wheelchair)?: A Little Help needed standing up from a chair using your arms (e.g., wheelchair or bedside chair)?: A Little Help needed to walk in hospital room?: A Little Help needed climbing 3-5 steps with  a railing? : A Little 6 Click Score: 20    End of Session Equipment Utilized During Treatment: Gait belt Activity Tolerance: Patient limited by fatigue Patient left: in bed;with call bell/phone within reach Nurse Communication: Mobility status PT Visit Diagnosis: Muscle weakness (generalized) (M62.81);Unsteadiness on feet (R26.81);Other abnormalities of gait and mobility (R26.89)    Time: 1334-1400 PT Time Calculation (min) (ACUTE ONLY): 26 min   Charges:   PT Evaluation $PT Eval Low Complexity: 1 Low          West Carbo, PT, DPT   Acute Rehabilitation Department Pager #: [phone]  Sandra Cockayne 11/01/2021, 2:22 PM

## 2021-11-01 NOTE — Assessment & Plan Note (Addendum)
-  Patient is K+ is now 4.1 -Continue monitor and replete as necessary mag level is now 2.2 -Repeat CMP in a.m.

## 2021-11-01 NOTE — Evaluation (Signed)
Occupational Therapy Evaluation Patient Details Name: Anthony [name] MRN: [id number] DOB: [birth year] Today's Date: 11/01/2021   History of Present Illness The pt is a 81 yo male presenting 2/8 with SOB and chest pain during HD. Work up with no acute fidings, cardiology consulted and signed off. PMH includes: alcohol use, anemia, CHF, CKD on HD MWF, mild dementia, HTN, CVA, and latent TB.   Clinical Impression   Prior to this admission, patient was living independently with his wife, with his daughter and granddaughter living in the trailer behind him and able to provide assistance. Patient endorses having two falls recently when attempting to go down stairs of trailer, but no injuries occurring. Patient able to complete household distances without RW and using RW to walk down the street. Patient is currently independent with bed mobility, and set up for ADLs. Patient able to demonstrate good balance to complete lower body ADLs without assist. Patient's main complaint is his oxygen, but patient demonstrating no sign of distress on RA (inaccurate pleth despite multiple attempts). Per OT, patient is likely close to his baseline, but would benefit from [facility] to return to prior level of function. Of note, family is concerned about not being able to take care of patient due to mild dementia. OT recommending family look into ALF or memory care. OT will continue to follow acutely to address problem list outlined below.      Recommendations for follow up therapy are one component of a multi-disciplinary discharge planning process, led by the attending physician.  Recommendations may be updated based on patient status, additional functional criteria and insurance authorization.   Follow Up Recommendations  Home health OT    Assistance Recommended at Discharge Intermittent Supervision/Assistance  Patient can return home with the following A little help with walking and/or transfers;A little help with  bathing/dressing/bathroom;Assistance with cooking/housework;Direct supervision/assist for medications management;Direct supervision/assist for financial management;Assist for transportation;Help with stairs or ramp for entrance    Functional Status Assessment  Patient has had a recent decline in their functional status and demonstrates the ability to make significant improvements in function in a reasonable and predictable amount of time.  Equipment Recommendations  None recommended by OT    Recommendations for Other Services       Precautions / Restrictions Precautions Precautions: Fall Precaution Comments: watch O2 sats Restrictions Weight Bearing Restrictions: No      Mobility Bed Mobility Overal bed mobility: Independent             General bed mobility comments: no increased time, no assist    Transfers Overall transfer level: Needs assistance Equipment used: None Transfers: Sit to/from Stand, Bed to chair/wheelchair/BSC Sit to Stand: Supervision     Step pivot transfers: Supervision     General transfer comment: pt able to complete x3 sit-stand without use of UE and with no change in VS, small steps to recliner with good stability, but does reach for single UE support      Balance Overall balance assessment: Mild deficits observed, not formally tested                                         ADL either performed or assessed with clinical judgement   ADL Overall ADL's : Needs assistance/impaired Eating/Feeding: Independent   Grooming: Set up   Upper Body Bathing: Set up   Lower Body Bathing: Set up;Sit to/from stand  Upper Body Dressing : Set up   Lower Body Dressing: Set up;Sitting/lateral leans Lower Body Dressing Details (indicate cue type and reason): sitting EOB able to don socks, had sweatpants on that he was able to don without assist Toilet Transfer: Magazine features editor Details (indicate cue type and reason):  simulated with transfer to recliner, able to complete without assist or DME         Functional mobility during ADLs: Set up General ADL Comments: Patient at set up level for ADLs, demonstrating minimal decrease in activity tolerance and endurance     Vision Baseline Vision/History: 1 Wears glasses (Readers PRN) Ability to See in Adequate Light: 0 Adequate Patient Visual Report: No change from baseline       Perception     Praxis      Pertinent Vitals/Pain Pain Assessment Pain Assessment: No/denies pain     Hand Dominance Right   Extremity/Trunk Assessment Upper Extremity Assessment Upper Extremity Assessment: Overall WFL for tasks assessed   Lower Extremity Assessment Lower Extremity Assessment: Defer to PT evaluation   Cervical / Trunk Assessment Cervical / Trunk Assessment: Kyphotic   Communication Communication Communication: HOH   Cognition Arousal/Alertness: Awake/alert Behavior During Therapy: WFL for tasks assessed/performed Overall Cognitive Status: History of cognitive impairments - at baseline                                 General Comments: Patient oriented and demonstrating anticipatory awareness, motivated and participatory     General Comments  VSS on RA, unable to get reliable pleth on O2 but pt in no distress and reports no SOB on RA    Exercises     Shoulder Instructions      Home Living Family/patient expects to be discharged to:: Private residence Living Arrangements: Spouse/significant other Available Help at Discharge: Family Type of Home: Mobile home Home Access: Stairs to enter Technical brewer of Steps: 6 Entrance Stairs-Rails: Left Home Layout: One level     Bathroom Shower/Tub: Tub/shower unit;Curtain   Biochemist, clinical: Standard     Home Equipment: Conservation officer, nature (2 wheels);Grab bars - toilet;Grab bars - tub/shower   Additional Comments: wears reading glasses      Prior Functioning/Environment  Prior Level of Function : Independent/Modified Independent;History of Falls (last six months) (fell down stairs of trailer twice)             Mobility Comments: pt reports stable without use of RW ADLs Comments: pt reports independence        OT Problem List: Decreased activity tolerance;Impaired balance (sitting and/or standing);Decreased strength      OT Treatment/Interventions: Self-care/ADL training;Therapeutic exercise;Energy conservation;DME and/or AE instruction;Therapeutic activities;Patient/family education;Balance training    OT Goals(Current goals can be found in the care plan section) Acute Rehab OT Goals Patient Stated Goal: to get better and figure out whats going on with my breathing OT Goal Formulation: With patient Time For Goal Achievement: 11/15/21 Potential to Achieve Goals: Good  OT Frequency: Min 2X/week    Co-evaluation   Reason for Co-Treatment: Other (comment) (patient with rapid response called this AM, completed for patient safety) PT goals addressed during session: Mobility/safety with mobility;Balance;Strengthening/ROM        AM-PAC OT "6 Clicks" Daily Activity     Outcome Measure Help from another person eating meals?: None Help from another person taking care of personal grooming?: A Little Help from another person toileting, which includes using  toliet, bedpan, or urinal?: A Little Help from another person bathing (including washing, rinsing, drying)?: A Little Help from another person to put on and taking off regular upper body clothing?: A Little Help from another person to put on and taking off regular lower body clothing?: A Little 6 Click Score: 19   End of Session Nurse Communication: Mobility status  Activity Tolerance: Patient tolerated treatment well Patient left: in bed;with call bell/phone within reach  OT Visit Diagnosis: Unsteadiness on feet (R26.81);Repeated falls (R29.6);Muscle weakness (generalized) (M62.81);History of  falling (Z91.81);Other abnormalities of gait and mobility (R26.89)                Time: 1334-1400 OT Time Calculation (min): 26 min Charges:  OT General Charges $OT Visit: 1 Visit OT Evaluation $OT Eval Moderate Complexity: 1 Mod  Corinne Ports E. Kayla Weekes, OTR/L Acute Rehabilitation Services [phone] [place] 11/01/2021, 3:04 PM

## 2021-11-01 NOTE — Assessment & Plan Note (Signed)
-  LE Duplex done and showed "There is no evidence of deep vein thrombosis in the lower extremity. However, portions of this examination were limited- see technologist comments above."

## 2021-11-01 NOTE — Assessment & Plan Note (Addendum)
-  Had Chest Pain/Discomfort with Dyspnea after Dialysis two Dialysis sessions in a row -Troponin went from 146 -> 113 -> 125 and similar to previous labs and suspected chronically due to ESRD -Was Chest Pain free -EKG similar to prior with inverted inferior T waves -Had a Mycardio perfusion study on 6/21 which showed a small defect of mderate severity in the apical inferior location -ECHO in 7/22 showed EF of 50-55% and Grade 2 DD with no significant valvular abnormalities -Cardiology consulted and he was evaluated for his recurrent chest pain and his symptoms and troponins were chronically elevated.  Patient had a cardiac catheterization back in June which showed nonobstructive coronary disease and a cardiology team recommended no further ischemic evaluation recommended continue home dose of aspirin 81 mg and increase the statin to 80 mg p.o. daily -Cardiology recommended checking lipids and liver panel in 8 weeks and recommended no further testing -Echocardiogram done and showed EF 50-55% and the Left Ventricl had no wall motion abnormalities and there is moderate concentric left ventricular hypertrophy and the ventricular diastolic parameters were consistent with grade 1 diastolic dysfunction.  Patient had severe MAC with protrusion with calcification on the annulus and into the left atrium and the cardiac echocardiogram showed no real significant change from prior study -Follow up with the Cardiology team in 4 to 6 weeks

## 2021-11-01 NOTE — Procedures (Signed)
° °  I was present at this dialysis session, have reviewed the session itself and made  appropriate changes Kelly Splinter MD [place] pager [phone]   11/01/2021, 4:18 PM

## 2021-11-01 NOTE — Assessment & Plan Note (Addendum)
-  He had prolonged QTc of 549 admission -Replating and monitoring electrolytes; K+ is 4.1 now, and Mag is 2.2 -Avoiding QT prolongation medications

## 2021-11-01 NOTE — Hospital Course (Addendum)
The patient is a 81 year old elderly African-American male with past medical history significant for but not to end-stage renal disease on hemodialysis, Monday Wednesday, Friday, history of latent TB, diabetes mellitus type 2, history of chronic diastolic CHF, history of COPD as well as dementia and other comorbidities who presented with chest pain after his dialysis session.  He was about 3 and half hours into his dialysis session on 10/30/21 when he felt an acute anterior chest like pressure and felt as if "somebody is sitting on his chest".  He also stated some dyspnea and shortness of breath as well as having some left lower extremity calf tenderness.  He reports compliance with his medications and was a current tobacco user and smokes a pack every 2 to 3 days.  In the ED he was initially documented hypotensive and hypoxic but his blood pressure was erroneously documented it was normal while he was in the ED.  Continued to have intermittent chest pain in the ED in the ED physician discussed with cardiology who recommended observation.  Troponin was flat at 146 and trended down to 125.  EKG was showing some inverted T waves that were similar to priors.  His BUN and creatinine were a little elevated.  He was admitted for his chest pain and cardiology evaluated and an echocardiogram was done.  Cardiology felt that his chest pain was not indicative of ACS given that he had a cardiac catheterization in June that showed nonobstructive coronary disease and they recommended no pursuit of ischemic evaluation but recommended resuming aspirin 81 mg p.o. daily as well as statin.  PT OT evaluated recommending home health however family feels that they cannot take care of the patient so a SNF work-up was been initiated.  Patient was dialyzed today and had similar symptoms and had some dyspnea and abdominal tenderness during his dialysis session and it was felt that he had too much fluid pulled during his hemodialysis and they  cut his treatment short due to his cramping and hypotension and they stopped his ultrafiltration with hemodialysis.  Subsequently nephrology gave the patient back 500 cc of fluid and he felt better.  Currently doing well but for some reason he was placed back on supplemental oxygen this morning but does not really dyspneic or hypoxic.  We will wean to room air.  Nephrology did dialysis today and they are recommending lowering the dry weight but not pull any fluid off with a next hemodialysis session.  Anticipating discharge going to SNF as he is medically stable today.

## 2021-11-01 NOTE — Consult Note (Signed)
[facility] Renal Consultation Note    Indication for Consultation:  Management of ESRD/hemodialysis; anemia, hypertension/volume and secondary hyperparathyroidism   HPI: Anthony [name] is a 81 y.o. male with ESRD on HD MWF  at [facility]. PMH also of T2DM, CHF, COPD, dementia who was admitted under observation status for chest pain/dyspnea that started during dialysis on Wednesday. On admission, tropoinins flat, no acute changes on EKG. No edema on CXR.  He had a cardiac cath in June that showed nonobstructive CAD. Cardiology was asked to evaluate and did not feel further ischemic evaluation was necessary. Labs: Na 140, K 3.8, CO2 28, BUN 22 Cr 8.79 WBC 4.8 Hgb 11.3.  Routine dialysis this am --about 2 hours into treatment he had an episode of abdominal pain with dyspnea. Feeling like "can't catch my breath" He was given supplemental oxygen and UF  was stopped.  Per staff he did have an episode of hypotension. Blood pressure improved to 115/74 HR 84 at the time of may exam. Breathing improving with oxygen O2 sats 93%. He did continue to endorse lower abdominal pain. He denies chest pain.   Past Medical History:  Diagnosis Date   Alcohol abuse 2021   Anemia    Arthritis    CAD (coronary artery disease)    CHF (congestive heart failure) ([place])    Chicken pox    COVID 02/2021   Dementia (HCC)    mild   ESRD (end stage renal disease) on dialysis ([place])    GI bleed    History of blood transfusion 03/2021   Hypertension    Insomnia    Mitral regurgitation 05/16/2021   PAF (paroxysmal atrial fibrillation) (HCC)    Pneumonia    Positive QuantiFERON-TB Gold test 05/16/2021   Stroke ([place])    TB lung, latent 05/16/2021   Past Surgical History:  Procedure Laterality Date   AV FISTULA PLACEMENT Right 05/14/2021   Procedure: INSERTION OF ARTERIOVENOUS (AV) GORE-TEX GRAFT ARM;  Surgeon: Angelia Mould, MD;  Location: [facility] OR;  Service: Vascular;  Laterality:  Right;   ENTEROSCOPY N/A 04/18/2021   Procedure: ENTEROSCOPY;  Surgeon: Jackquline Denmark, MD;  Location: [place];  Service: Endoscopy;  Laterality: N/A;   HOT HEMOSTASIS N/A 04/18/2021   Procedure: HOT HEMOSTASIS (ARGON PLASMA COAGULATION/BICAP);  Surgeon: Jackquline Denmark, MD;  Location: [facility] ENDOSCOPY;  Service: Endoscopy;  Laterality: N/A;   SUBMUCOSAL TATTOO INJECTION  04/18/2021   Procedure: SUBMUCOSAL TATTOO INJECTION;  Surgeon: Jackquline Denmark, MD;  Location: [facility] ENDOSCOPY;  Service: Endoscopy;;   Family History  Problem Relation Age of Onset   Hypertension Mother    Hypertension Father    Social History:  reports that he has been smoking cigarettes. He has a 15.25 pack-year smoking history. He has never used smokeless tobacco. He reports that he does not currently use alcohol. He reports that he does not currently use drugs. No Known Allergies Prior to Admission medications   Medication Sig Start Date End Date Taking? Authorizing Provider  ADVAIR HFA 45-21 MCG/ACT inhaler INHALE 2 PUFFS INTO THE LUNGS IN THE MORNING AND AT BEDTIME Patient taking differently: 2 puffs 2 (two) times daily as needed (shortness of breath/wheezing). 01/17/21  Yes Isaac Bliss, Rayford Halsted, MD  albuterol (PROVENTIL HFA) 108 (90 Base) MCG/ACT inhaler Inhale 2 puffs into the lungs every 4 (four) hours as needed for wheezing or shortness of breath. 03/08/20  Yes Laurey Morale, MD  aspirin EC 81 MG tablet Take 81 mg by  mouth every morning. Swallow whole.   Yes [provider]  calcium acetate (PHOSLO) 667 MG capsule Take 667 mg by mouth 3 (three) times daily after meals.   Yes [provider]  carvedilol (COREG) 6.25 MG tablet Take 1 tablet (6.25 mg total) by mouth 2 (two) times daily with a meal. 03/02/20  Yes Black, Lezlie Octave, NP  isoniazid (NYDRAZID) 300 MG tablet TAKE 1 TABLET BY MOUTH EVERY DAY Patient taking differently: Take 300 mg by mouth every morning. 07/26/21  Yes Tommy Medal, Lavell Islam, MD   atorvastatin (LIPITOR) 20 MG tablet TAKE 1 TABLET BY MOUTH EVERYDAY AT BEDTIME Patient taking differently: Take 20 mg by mouth daily. 07/30/20   Isaac Bliss, Rayford Halsted, MD  budesonide-formoterol [facility]) 160-4.5 MCG/ACT inhaler Inhale 2 puffs into the lungs daily. 04/12/21   [provider]  carboxymethylcellulose (REFRESH PLUS) 0.5 % SOLN Place 1 drop into both eyes daily as needed (dry eyes). 12/07/20   [provider]  lidocaine-prilocaine (EMLA) cream Apply 1 application topically daily as needed (port access).    [provider]  memantine (NAMENDA) 5 MG tablet Take 5 mg by mouth daily. 03/12/21   [provider]  pantoprazole (PROTONIX) 40 MG tablet Take 1 tablet (40 mg total) by mouth daily. 04/24/21   Mariel Aloe, MD  pyridOXINE (VITAMIN B-6) 50 MG tablet Take 1 tablet (50 mg total) by mouth daily. 05/16/21 02/10/22  Truman Hayward, MD  Tiotropium Bromide Monohydrate (SPIRIVA RESPIMAT) 2.5 MCG/ACT AERS Inhale 2 puffs into the lungs daily. 03/15/20   Isaac Bliss, Rayford Halsted, MD  traZODone (DESYREL) 50 MG tablet Take 1-2 tablets (50-100 mg total) by mouth at bedtime as needed. Patient taking differently: Take 50-100 mg by mouth at bedtime as needed for sleep. 06/12/21   Isaac Bliss, Rayford Halsted, MD   Current Facility-Administered Medications  Medication Dose Route Frequency Provider Last Rate Last Admin   0.9 %  sodium chloride infusion  100 mL Intravenous PRN Edrick Oh, MD       0.9 %  sodium chloride infusion  100 mL Intravenous PRN Edrick Oh, MD       acetaminophen (TYLENOL) tablet 650 mg  650 mg Oral Q4H PRN Tu, Ching T, DO       alteplase (CATHFLO ACTIVASE) injection 2 mg  2 mg Intracatheter Once PRN Edrick Oh, MD       Chlorhexidine Gluconate Cloth 2 % PADS 6 each  6 each Topical Q0600 Edrick Oh, MD       heparin injection 1,000 Units  1,000 Units Dialysis PRN Edrick Oh, MD       heparin injection 5,000 Units  5,000  Units Subcutaneous Q8H Tu, Ching T, DO   5,000 Units at 11/01/21 0602   lidocaine (PF) (XYLOCAINE) 1 % injection 5 mL  5 mL Intradermal PRN Edrick Oh, MD       lidocaine-prilocaine (EMLA) cream 1 application  1 application Topical PRN Edrick Oh, MD       memantine [facility]) tablet 5 mg  5 mg Oral Daily Opyd, Ilene Qua, MD       nitroGLYCERIN (NITROSTAT) SL tablet 0.4 mg  0.4 mg Sublingual Q5 min PRN Regan Lemming, MD       pentafluoroprop-tetrafluoroeth [facility]) aerosol 1 application  1 application Topical PRN Edrick Oh, MD       traZODone (DESYREL) tablet 50-100 mg  50-100 mg Oral QHS PRN Opyd, Ilene Qua, MD   50 mg at  10/31/21 2344     ROS: As per HPI otherwise negative.  Physical Exam: Vitals:   11/01/21 0830 11/01/21 0900 11/01/21 0930 11/01/21 1000  BP: 129/72 115/65 110/61 (!) 103/52  Pulse: 71 80 84   Resp: 15 (!) 25    Temp:      TempSrc:      SpO2: (!) 87% 94% (!) 88%   Weight:      Height:         General: Appears comfortable, in no distress  Head: NCAT sclera not icteric MMM Neck: Supple. No JVD appreciated  Lungs: Clear bilaterally without wheezes, rales, or rhonchi. Normal WOB  Heart: RRR, no murmur, rub, or gallop  Abdomen: soft, no masses, no ascites, +tenderness RLQ  Lower extremities:without edema or ischemic changes, no open wounds  Neuro: A & O X 3. Moves all extremities spontaneously. Psych:  Responds to questions appropriately with a normal affect. Dialysis Access: R IJ TDC in use   Labs: Basic Metabolic Panel: Recent Labs  Lab 10/30/21 1638 10/30/21 1704 10/31/21 1823  NA 136 135 140  K 3.1* 3.1* 3.8  CL 94* 95* 98  CO2 28  --  28  GLUCOSE 83 89 109*  BUN _0 CREATININE 4.83* 5.10* 8.79*  CALCIUM 8.0*  --  8.6*  PHOS  --   --  6.4*   Liver Function Tests: Recent Labs  Lab 10/30/21 1638 10/31/21 1823  AST 16 8*  ALT 6 <5  ALKPHOS 85 87  BILITOT 0.6 0.3  PROT 7.8 6.9  ALBUMIN 3.2* 2.9*   No results for input(s):  LIPASE, AMYLASE in the last 168 hours. No results for input(s): AMMONIA in the last 168 hours. CBC: Recent Labs  Lab 10/30/21 1638 10/30/21 1704 10/31/21 1823 11/01/21 0627  WBC 6.3  --  4.9 4.8  NEUTROABS 4.2  --  3.0  --   HGB 11.2* 17.7* 10.9* 11.3*  HCT 38.0* 52.0 37.5* 37.3*  MCV 91.3  --  92.1 89.9  PLT 246  --  292 267   Cardiac Enzymes: No results for input(s): CKTOTAL, CKMB, CKMBINDEX, TROPONINI in the last 168 hours. CBG: Recent Labs  Lab 10/30/21 1634 11/01/21 0939  GLUCAP 98 87   Iron Studies: No results for input(s): IRON, TIBC, TRANSFERRIN, FERRITIN in the last 72 hours. Studies/Results: DG Chest 2 View  Result Date: 10/30/2021 CLINICAL DATA:  Shortness of breath EXAM: CHEST - 2 VIEW COMPARISON:  Previous studies including the examination of 09/09/2021 FINDINGS: Cardiac size is within normal limits. There are no signs of pulmonary edema. There are linear densities in the left lower lung fields. There is blunting of left lateral CP angle. There is no pneumothorax. Tip of right IJ dialysis catheter is seen in the right atrium. There is vascular coil in the left axilla. IMPRESSION: There are linear densities in the left lower lung fields suggesting subsegmental atelectasis. Possible small left pleural effusion. There are no signs of alveolar pulmonary edema. Tip of dialysis catheter is seen in the right atrium. Electronically Signed   By: Elmer Picker M.D.   On: 10/30/2021 18:05   ECHOCARDIOGRAM COMPLETE  Result Date: 10/31/2021    ECHOCARDIOGRAM REPORT   Patient Name:   Anthony [name] Date of Exam: 10/31/2021 Medical Rec #:  [id number]      Height:       67.0 in Accession #:    [id number]     Weight:       154.9  lb Date of Birth:  04/14/1941       BSA:          1.814 m Patient Age:    67 years       BP:           131/70 mmHg Patient Gender: M              HR:           60 bpm. Exam Location:  Inpatient Procedure: 2D Echo Indications:    Chest pain  History:         Patient has prior history of Echocardiogram examinations, most                 recent 04/17/2021. CAD, COPD; Risk Factors:Hypertension.  Sonographer:    Jefferey Pica Referring Phys: [id number] OMAIR LATIF Naplate  1. Left ventricular ejection fraction, by estimation, is 50 to 55%. The left ventricle has low normal function. The left ventricle has no regional wall motion abnormalities. There is moderate concentric left ventricular hypertrophy. Left ventricular diastolic parameters are consistent with Grade I diastolic dysfunction (impaired relaxation). Elevated left ventricular end-diastolic pressure.  2. Right ventricular systolic function is normal. The right ventricular size is normal. Tricuspid regurgitation signal is inadequate for assessing PA pressure.  3. Left atrial size was moderately dilated.  4. Severe MAC with protrusion of calcification beyond annulus and into left atrium. The mitral valve is grossly normal. Trivial mitral valve regurgitation. Severe mitral annular calcification.  5. The aortic valve is grossly normal. There is moderate calcification of the aortic valve. Aortic valve regurgitation is not visualized. Aortic valve sclerosis/calcification is present, without any evidence of aortic stenosis.  6. The inferior vena cava is normal in size with greater than 50% respiratory variability, suggesting right atrial pressure of 3 mmHg. Comparison(s): No significant change from prior study. FINDINGS  Left Ventricle: Left ventricular ejection fraction, by estimation, is 50 to 55%. The left ventricle has low normal function. The left ventricle has no regional wall motion abnormalities. The left ventricular internal cavity size was normal in size. There is moderate concentric left ventricular hypertrophy. Left ventricular diastolic parameters are consistent with Grade I diastolic dysfunction (impaired relaxation). Elevated left ventricular end-diastolic pressure. Right Ventricle: The right  ventricular size is normal. No increase in right ventricular wall thickness. Right ventricular systolic function is normal. Tricuspid regurgitation signal is inadequate for assessing PA pressure. Left Atrium: Left atrial size was moderately dilated. Right Atrium: Right atrial size was normal in size. Pericardium: There is no evidence of pericardial effusion. Mitral Valve: Severe MAC with protrusion of calcification beyond annulus and into left atrium. The mitral valve is grossly normal. Severe mitral annular calcification. Trivial mitral valve regurgitation. Tricuspid Valve: The tricuspid valve is normal in structure. Tricuspid valve regurgitation is trivial. No evidence of tricuspid stenosis. Aortic Valve: The aortic valve is grossly normal. There is moderate calcification of the aortic valve. Aortic valve regurgitation is not visualized. Aortic valve sclerosis/calcification is present, without any evidence of aortic stenosis. Aortic valve peak gradient measures 8.8 mmHg. Pulmonic Valve: The pulmonic valve was not well visualized. Pulmonic valve regurgitation is not visualized. Aorta: The aortic root and ascending aorta are structurally normal, with no evidence of dilitation. Venous: The inferior vena cava is normal in size with greater than 50% respiratory variability, suggesting right atrial pressure of 3 mmHg. IAS/Shunts: The atrial septum is grossly normal.  LEFT VENTRICLE PLAX 2D LVIDd:  4.50 cm     Diastology LVIDs:         3.80 cm     LV e' medial:    4.06 cm/s LV PW:         1.60 cm     LV E/e' medial:  14.2 LV IVS:        2.10 cm     LV e' lateral:   2.62 cm/s LVOT diam:     2.00 cm     LV E/e' lateral: 21.9 LV SV:         69 LV SV Index:   38 LVOT Area:     3.14 cm  LV Volumes (MOD) LV vol d, MOD A4C: 92.2 ml LV vol s, MOD A4C: 59.0 ml LV SV MOD A4C:     92.2 ml RIGHT VENTRICLE            IVC RV Basal diam:  2.60 cm    IVC diam: 1.70 cm RV S prime:     9.56 cm/s TAPSE (M-mode): 1.9 cm LEFT ATRIUM              Index        RIGHT ATRIUM           Index LA diam:        3.90 cm 2.15 cm/m   RA Area:     14.00 cm LA Vol (A2C):   69.6 ml 38.37 ml/m  RA Volume:   33.70 ml  18.58 ml/m LA Vol (A4C):   55.2 ml 30.43 ml/m LA Biplane Vol: 61.3 ml 33.79 ml/m  AORTIC VALVE                 PULMONIC VALVE AV Area (Vmax): 2.48 cm     PV Vmax:       0.75 m/s AV Vmax:        148.50 cm/s  PV Peak grad:  2.3 mmHg AV Peak Grad:   8.8 mmHg LVOT Vmax:      117.00 cm/s LVOT Vmean:     81.000 cm/s LVOT VTI:       0.221 m  AORTA Ao Root diam: 3.20 cm MITRAL VALVE MV Area (PHT): 2.54 cm     SHUNTS MV Decel Time: 299 msec     Systemic VTI:  0.22 m MV E velocity: 57.50 cm/s   Systemic Diam: 2.00 cm MV A velocity: 110.00 cm/s MV E/A ratio:  0.52 Buford Dresser MD Electronically signed by Buford Dresser MD Signature Date/Time: 10/31/2021/6:18:12 PM    Final    VAS [place] LOWER EXTREMITY VENOUS (DVT)  Result Date: 10/31/2021  Lower Venous DVT Study Patient Name:  Anthony [name]  Date of Exam:   10/31/2021 Medical Rec #: [id number]       Accession #:    [id number] Date of Birth: [birth year]        Patient Gender: M Patient Age:   40 years Exam Location:  [facility] Procedure:      VAS [place] LOWER EXTREMITY VENOUS (DVT) Referring Phys: CHING TU --------------------------------------------------------------------------------  Indications: Pain.  Limitations: Veins shadowed out by calcifications, scarring in left calf. Comparison Study: No prior study on file Performing Technologist: Sharion Dove RVS  Examination Guidelines: A complete evaluation includes B-mode imaging, spectral Doppler, color Doppler, and power Doppler as needed of all accessible portions of each vessel. Bilateral testing is considered an integral part of a complete examination. Limited examinations for reoccurring indications may be performed as noted.  The reflux portion of the exam is performed with the patient in reverse Trendelenburg.   +-----+---------------+---------+-----------+----------+--------------+  RIGHT Compressibility Phasicity Spontaneity Properties Thrombus Aging  +-----+---------------+---------+-----------+----------+--------------+  CFV   Full            Yes       Yes                                    +-----+---------------+---------+-----------+----------+--------------+   +---------+---------------+---------+-----------+----------+-------------------+  LEFT      Compressibility Phasicity Spontaneity Properties Thrombus Aging       +---------+---------------+---------+-----------+----------+-------------------+  CFV       Full            Yes       Yes                                         +---------+---------------+---------+-----------+----------+-------------------+  SFJ       Full                                                                  +---------+---------------+---------+-----------+----------+-------------------+  FV Prox   Full            Yes       Yes                                         +---------+---------------+---------+-----------+----------+-------------------+  FV Mid    Full                                                                  +---------+---------------+---------+-----------+----------+-------------------+  FV Distal Full            Yes       Yes                                         +---------+---------------+---------+-----------+----------+-------------------+  PFV       Full            Yes       Yes                                         +---------+---------------+---------+-----------+----------+-------------------+  POP       Full                                                                  +---------+---------------+---------+-----------+----------+-------------------+  PTV                                                        Not well visualized  +---------+---------------+---------+-----------+----------+-------------------+  PERO                                                        Not well visualized  +---------+---------------+---------+-----------+----------+-------------------+    Summary: RIGHT: - No evidence of common femoral vein obstruction.  LEFT: - There is no evidence of deep vein thrombosis in the lower extremity. However, portions of this examination were limited- see technologist comments above.  - No cystic structure found in the popliteal fossa.  *See table(s) above for measurements and observations. Electronically signed by Monica Martinez MD on 10/31/2021 at 6:35:01 PM.    Final     Dialysis Orders:  Unit: [place] MWF  Time: 4:00  EDW: 67 kg  Flows: 400/800  [place]:  2K/2Ca  Access: TDC  Heparin: 3000  ESA: Mircera 225 q 2 wks. Last 1/30 VDRA: Calcitriol 2.25 TIW   Assessment/Plan: Chest pain/dyspnea (reason for admission) -- Hx nonobs CAD, CHF. Seen by cardiology, not planning further evaluation. No cp this am. He's endorsing lower abdominal pain during dialysis ?abd cramping from UF on HD. Monitor.  ESRD -  HD MWF. Usual HD today -cutting treatment short d/t cramping/hypotension  Hypotension/volume  - Variable blood pressures. Does have episodes of hypotension on HD as outpatient. Below dry weight. Stop UF with HD today. Monitor. No HTN meds.   Anemia  - Stable. No ESA needs   Metabolic bone disease -  Continue home binders/calcitriol if stays in hospital DM -per primary   Lynnda Child PA-C [place] 11/01/2021, 11:10 AM

## 2021-11-01 NOTE — Assessment & Plan Note (Addendum)
-  Repeat Echocardiogram showed diastolic dysfunction consistent with grade 1 -Appears currently compensated and he has dialysis for volume maintenance -Nephrology likely dialyzed too much and had too much ultrafiltration so they given back 500 mL today -Strict I's and O's and daily weights; Patient is +1,016 mL since admission and Weight wass down 5 lbs since admission but he is now 6 lbs heavier than admit and ? Accuracy

## 2021-11-02 LAB — PHOSPHORUS: Phosphorus: 5.3 mg/dL — ABNORMAL HIGH (ref 2.5–4.6)

## 2021-11-02 LAB — CBC WITH DIFFERENTIAL/PLATELET
Abs Immature Granulocytes: 0.05 10*3/uL (ref 0.00–0.07)
Basophils Absolute: 0 10*3/uL (ref 0.0–0.1)
Basophils Relative: 1 %
Eosinophils Absolute: 0.1 10*3/uL (ref 0.0–0.5)
Eosinophils Relative: 2 %
HCT: 37.4 % — ABNORMAL LOW (ref 39.0–52.0)
Hemoglobin: 11.4 g/dL — ABNORMAL LOW (ref 13.0–17.0)
Immature Granulocytes: 1 %
Lymphocytes Relative: 23 %
Lymphs Abs: 1.4 10*3/uL (ref 0.7–4.0)
MCH: 27.3 pg (ref 26.0–34.0)
MCHC: 30.5 g/dL (ref 30.0–36.0)
MCV: 89.5 fL (ref 80.0–100.0)
Monocytes Absolute: 0.7 10*3/uL (ref 0.1–1.0)
Monocytes Relative: 12 %
Neutro Abs: 3.8 10*3/uL (ref 1.7–7.7)
Neutrophils Relative %: 61 %
Platelets: 249 10*3/uL (ref 150–400)
RBC: 4.18 MIL/uL — ABNORMAL LOW (ref 4.22–5.81)
RDW: 20 % — ABNORMAL HIGH (ref 11.5–15.5)
WBC: 6.2 10*3/uL (ref 4.0–10.5)
nRBC: 0 % (ref 0.0–0.2)

## 2021-11-02 LAB — HEPATITIS B SURFACE ANTIBODY, QUANTITATIVE: Hep B S AB Quant (Post): <3.1 m[IU]/mL — ABNORMAL LOW (ref >9.9)

## 2021-11-02 LAB — COMPREHENSIVE METABOLIC PANEL
ALT: <5 U/L (ref 0–44)
AST: 12 U/L — ABNORMAL LOW (ref 15–41)
Albumin: 3 g/dL — ABNORMAL LOW (ref 3.5–5.0)
Alkaline Phosphatase: 89 U/L (ref 38–126)
Anion gap: 13 (ref 5–15)
BUN: 24 mg/dL — ABNORMAL HIGH (ref 8–23)
CO2: 26 mmol/L (ref 22–32)
Calcium: 8.8 mg/dL — ABNORMAL LOW (ref 8.9–10.3)
Chloride: 97 mmol/L — ABNORMAL LOW (ref 98–111)
Creatinine, Ser: 8.63 mg/dL — ABNORMAL HIGH (ref 0.61–1.24)
GFR, Estimated: 6 mL/min — ABNORMAL LOW (ref >60)
Glucose, Bld: 100 mg/dL — ABNORMAL HIGH (ref 70–99)
Potassium: 3.9 mmol/L (ref 3.5–5.1)
Sodium: 136 mmol/L (ref 135–145)
Total Bilirubin: 0.5 mg/dL (ref 0.3–1.2)
Total Protein: 7.4 g/dL (ref 6.5–8.1)

## 2021-11-02 LAB — MAGNESIUM: Magnesium: 2.2 mg/dL (ref 1.7–2.4)

## 2021-11-02 NOTE — Progress Notes (Signed)
Physical Therapy Treatment Patient Details Name: Anthony [name] MRN: [id number] DOB: 1941/08/14 Today's Date: 11/02/2021   History of Present Illness The pt is a 81 yo male presenting 2/8 with SOB and chest pain during HD. Work up with no acute fidings, cardiology consulted and signed off. PMH includes: alcohol use, anemia, CHF, CKD on HD MWF, mild dementia, HTN, CVA, and latent TB.    PT Comments    Pt is making good, steady progress with mobility. Pt was able to ambulate further distances of up to ~100-115 ft at a time with no UE support, but displayed instability, especially when challenging his vestibular system. Pt required minA to recover during LOB bouts that occurred when turning his head. Assuming he would have needed 2 feet per step and needed to stop to step over an obstacle, pt scored a 12 on the DGI today, suggestive of high risk for falls. If pt has the support needed at d/c, continue to recommend HHPT follow-up. However, pt's family is reporting concern over caring for the pt and they are requesting pt go to a SNF at d/c, per chart. Will continue to follow acutely.    Recommendations for follow up therapy are one component of a multi-disciplinary discharge planning process, led by the attending physician.  Recommendations may be updated based on patient status, additional functional criteria and insurance authorization.  Follow Up Recommendations  Home health PT (family requesting SNF due to "inability to care for the pt")     Assistance Recommended at Discharge Intermittent Supervision/Assistance  Patient can return home with the following A little help with walking and/or transfers;Assistance with cooking/housework;Direct supervision/assist for financial management;Direct supervision/assist for medications management;Assist for transportation;Help with stairs or ramp for entrance   Equipment Recommendations  None recommended by PT    Recommendations for Other Services        Precautions / Restrictions Precautions Precautions: Fall Precaution Comments: watch O2 sats Restrictions Weight Bearing Restrictions: No     Mobility  Bed Mobility Overal bed mobility: Modified Independent             General bed mobility comments: HOB elevated, no assist    Transfers Overall transfer level: Needs assistance Equipment used: None Transfers: Sit to/from Stand Sit to Stand: Supervision           General transfer comment: Able to come to stand from EOB without assistance or LOB.    Ambulation/Gait Ambulation/Gait assistance: Min guard, Min assist Gait Distance (Feet): 115 Feet (x2 bouts of ~100 ft > ~115 ft) Assistive device: None Gait Pattern/deviations: Step-to pattern, Decreased step length - left, Decreased dorsiflexion - left, Decreased stride length, Trunk flexed, Staggering left, Staggering right Gait velocity: reduced Gait velocity interpretation: <1.31 ft/sec, indicative of household ambulator   General Gait Details: Pt with step-to pattern and flexed trunk posture. Cues to increase L step length and foot clearance to progress to step-through pattern, intermittent momentary success. Pt with LOB when turning head L <> R, requiring minA to recover. Pt slows with looking up <> down and with turning. x1 seated rest break due to DOE 3/4.   Stairs             Wheelchair Mobility    Modified [place] (Stroke Patients Only)       Balance Overall balance assessment: Mild deficits observed, not formally tested  Standardized Balance Assessment Standardized Balance Assessment : Dynamic Gait Index   Dynamic Gait Index Level Surface: Mild Impairment Change in Gait Speed: Normal Gait with Horizontal Head Turns: Severe Impairment Gait with Vertical Head Turns: Moderate Impairment Gait and Pivot Turn: Mild Impairment Step Over Obstacle: Moderate Impairment (assumed) Step Around Obstacles: Mild  Impairment Steps: Moderate Impairment (assumed) Total Score: 12      Cognition Arousal/Alertness: Awake/alert Behavior During Therapy: WFL for tasks assessed/performed Overall Cognitive Status: History of cognitive impairments - at baseline                                 General Comments: Disoriented to time, thinking it was "December 2023", but otherwise oriented.        Exercises      General Comments        Pertinent Vitals/Pain Pain Assessment Pain Assessment: Faces Faces Pain Scale: No hurt Pain Intervention(s): Monitored during session    Home Living                          Prior Function            PT Goals (current goals can now be found in the care plan section) Acute Rehab PT Goals Patient Stated Goal: to get better PT Goal Formulation: With patient Time For Goal Achievement: 11/15/21 Potential to Achieve Goals: Good Progress towards PT goals: Progressing toward goals    Frequency    Min 3X/week      PT Plan Current plan remains appropriate    Co-evaluation              AM-PAC PT "6 Clicks" Mobility   Outcome Measure  Help needed turning from your back to your side while in a flat bed without using bedrails?: None Help needed moving from lying on your back to sitting on the side of a flat bed without using bedrails?: None Help needed moving to and from a bed to a chair (including a wheelchair)?: A Little Help needed standing up from a chair using your arms (e.g., wheelchair or bedside chair)?: A Little Help needed to walk in hospital room?: A Little Help needed climbing 3-5 steps with a railing? : A Little 6 Click Score: 20    End of Session Equipment Utilized During Treatment: Gait belt Activity Tolerance: Patient limited by fatigue Patient left: with call bell/phone within reach;in chair;with chair alarm set   PT Visit Diagnosis: Muscle weakness (generalized) (M62.81);Unsteadiness on feet (R26.81);Other  abnormalities of gait and mobility (R26.89);Difficulty in walking, not elsewhere classified (R26.2)     Time: 8546-2703 PT Time Calculation (min) (ACUTE ONLY): 17 min  Charges:  $Gait Training: 8-22 mins                     Moishe Spice, PT, DPT Acute Rehabilitation Services  Pager: [phone] Office: [place] 11/02/2021, 3:33 PM

## 2021-11-02 NOTE — Plan of Care (Signed)
°  Problem: Education: Goal: Knowledge of General Education information will improve Description: Including pain rating scale, medication(s)/side effects and non-pharmacologic comfort measures Outcome: Progressing   Problem: Health Behavior/Discharge Planning: Goal: Ability to manage health-related needs will improve Outcome: Progressing   Problem: Clinical Measurements: Goal: Ability to maintain clinical measurements within normal limits will improve Outcome: Progressing   Problem: Clinical Measurements: Goal: Ability to maintain clinical measurements within normal limits will improve Outcome: Progressing   Problem: Clinical Measurements: Goal: Will remain free from infection Outcome: Progressing   Problem: Clinical Measurements: Goal: Diagnostic test results will improve Outcome: Progressing

## 2021-11-02 NOTE — Progress Notes (Signed)
°  [facility] Progress Note   Subjective: Seen in room. Dialysis treatment shortened yesterday d/t hypotension, cramping. Feels much better today. C/o right toe pain. Denies cp, dyspnea, abd pain n/v.   Objective Vitals:   11/01/21 1943 11/02/21 0003 11/02/21 0343 11/02/21 0810  BP: 135/80 (!) 145/79 139/74   Pulse: 83 83 79   Resp: 19 20 20    Temp: 97.7 F (36.5 C) 97.7 F (36.5 C) (!) 97.5 F (36.4 C)   TempSrc: Oral Oral Oral   SpO2: 92% 96% 92% 93%  Weight:   62 kg   Height:         Additional Objective Labs: Basic Metabolic Panel: Recent Labs  Lab 10/30/21 1638 10/30/21 1704 10/31/21 1823 11/02/21 0332  NA 136 135 140 136  K 3.1* 3.1* 3.8 3.9  CL 94* 95* 98 97*  CO2 28  --  28 26  GLUCOSE 83 89 109* 100*  BUN 8 9 22  24*  CREATININE 4.83* 5.10* 8.79* 8.63*  CALCIUM 8.0*  --  8.6* 8.8*  PHOS  --   --  6.4* 5.3*   CBC: Recent Labs  Lab 10/30/21 1638 10/30/21 1704 10/31/21 1823 11/01/21 0627 11/01/21 0801 11/02/21 0332  WBC 6.3  --  4.9 4.8 5.1 6.2  NEUTROABS 4.2  --  3.0  --   --  3.8  HGB 11.2*   < > 10.9* 11.3* 10.9* 11.4*  HCT 38.0*   < > 37.5* 37.3* 36.2* 37.4*  MCV 91.3  --  92.1 89.9 91.9 89.5  PLT 246  --  292 267 287 249   < > = values in this interval not displayed.   Blood Culture No results found for: SDES, SPECREQUEST, CULT, REPTSTATUS   Physical Exam General: Elderly man, nad  Heart: RRR No m,r,g  Lungs: Clear bilaterally  Abdomen: soft non-tender  Extremities: No LE edema  Dialysis Access: R IJ TDC   Medications:   aspirin EC  81 mg Oral Daily   atorvastatin  80 mg Oral Daily   Chlorhexidine Gluconate Cloth  6 each Topical Q0600   fluticasone furoate-vilanterol  1 puff Inhalation Daily   heparin  5,000 Units Subcutaneous Q8H   isoniazid  300 mg Oral Daily   memantine  5 mg Oral Daily   pantoprazole  40 mg Oral Daily   pyridOXINE  50 mg Oral Daily   umeclidinium bromide  1 puff Inhalation Daily     Dialysis Orders:  Unit: [place] MWF  Time: 4:00  EDW: 67 kg  Flows: 400/800  [place]:  2K/2Ca  Access: TDC  Heparin: 3000  ESA: Mircera 225 q 2 wks. Last 1/30 VDRA: Calcitriol 2.25 TIW    Assessment/Plan: Chest pain/dyspnea (reason for admission) -- Hx nonobs CAD, CHF. Seen by cardiology, not planning further evaluation.  Resolved. No c/os this am.  ESRD -  HD MWF. Short HD yesterday d/t cramping/hypotension. No dialysis indications today. Next HD 2/13.  Hypotension/volume  - Variable blood pressures. Hypotensive episode on HD here - s/p small fluid bolus. Blood pressure improved. Well below outpatient dry weight. Keep even with next HD and monitor  Anemia  - Stable. No ESA needs  Metabolic bone disease -  Continue home binders/calcitriol if stays in hospital DM -per primary  Debility PT/OT per primary   Lynnda Child PA-C [place] 11/02/2021,10:51 AM

## 2021-11-02 NOTE — TOC Initial Note (Signed)
Transition of Care [facility]) - Initial/Assessment Note    Patient Details  Name: Anthony [name] MRN: [id number] Date of Birth: Feb 01, 1941  Transition of Care [facility]) CM/SW Contact:    Tresa Endo Phone Number: 11/02/2021, 1:10 PM  Clinical Narrative:                 CSW received SNF consult. CSW met with pt at bedside pt was extremely tired and asked CSW to contact his wife about a SNF. CSW contacted pt wife, introduced self and explained role at the hospital. Pt reports that PTA the pt was from home with spouse. Pt goes to HD and is transported by family. PT reports pt was independent with mobility before admission but now has limitations in functional mobility, stability, and activity tolerance. Pt has a click score of 20 but per PT family cannot provide appropriate level of support.  CSW reviewed PT/OT recommendations for SNF. Pt reports being agreeable to DC to a SNF. Pt gave CSW permission to fax out to facilities in the area. Pt spouse explained that she and her family are currently working on applying for LTC in [place] near the [facility] and would like the pt to attend here; also going to the same facility for SNF. CSW will continue to follow.    Expected Discharge Plan: Skilled Nursing Facility Barriers to Discharge: Continued Medical Work up   Patient Goals and CMS Choice Patient states their goals for this hospitalization and ongoing recovery are:: Rehab CMS Medicare.gov Compare Post Acute Care list provided to:: Patient Choice offered to / list presented to : Patient  Expected Discharge Plan and Services Expected Discharge Plan: [place] In-house Referral: Clinical Social Work   Post Acute Care Choice: [place] Living arrangements for the past 2 months: [place]                                      Prior Living Arrangements/Services Living arrangements for the past 2 months: [place] Lives with:: Spouse Patient language and need for interpreter reviewed:: Yes        Need for Family Participation in Patient Care: Yes (Comment) Care giver support system in place?: Yes (comment)   Criminal Activity/Legal Involvement Pertinent to Current Situation/Hospitalization: No - Comment as needed  Activities of Daily Living      Permission Sought/Granted Permission sought to share information with : Family Supports, Chartered certified accountant granted to share information with : Yes, Verbal Permission Granted  Share Information with NAME: Destry, Dauber (Spouse)   [phone]  Permission granted to share info w AGENCY: SNF  Permission granted to share info w Relationship: Jaman, Aro (Spouse)   [phone]  Permission granted to share info w Contact Information: Lc, Joynt (Spouse)   [phone]  Emotional Assessment Appearance:: Appears stated age Attitude/Demeanor/Rapport: Other (comment) (Tired) Affect (typically observed): Appropriate Orientation: : Oriented to Self, Oriented to Place, Oriented to  Time, Oriented to Situation Alcohol / Substance Use: Not Applicable Psych Involvement: No (comment)  Admission diagnosis:  Elevated troponin [R77.8] Chest pain [R07.9] Chest pain, unspecified type [R07.9] Patient Active Problem List   Diagnosis Date Noted   Dyspnea 11/01/2021   Hypokalemia 11/01/2021   Leg pain 11/01/2021   Positive QuantiFERON-TB Gold test 05/16/2021   TB lung, latent 05/16/2021   Mitral regurgitation 05/16/2021   Malnutrition of moderate degree 04/18/2021  Anemia 04/16/2021   Symptomatic anemia 04/16/2021   COPD (chronic obstructive pulmonary disease) ([place]) 04/16/2021   CAD (coronary artery disease) 04/16/2021   HLD (hyperlipidemia) 04/16/2021   Prolonged QT interval 04/16/2021   Abnormal ECG 04/16/2021   Dementia without behavioral disturbance ([place]) 04/16/2021   COPD with acute exacerbation ([place])  04/16/2021   Occult blood positive stool 04/16/2021   COPD exacerbation ([place]) 03/01/2020   Tobacco use disorder 03/01/2020   Chronic diastolic heart failure (HCC)    PAF (paroxysmal atrial fibrillation) ([place])    Anemia of chronic disease 02/29/2020   Hypertensive urgency 02/29/2020   Elevated troponin 02/29/2020   Chest pain 02/29/2020   ESRD (end stage renal disease) on dialysis ([place]) 12/15/2019   HTN (hypertension) 12/15/2019   Insomnia    PCP:  Isaac Bliss, Rayford Halsted, MD Pharmacy:   [id number] [place] [address] [place]Phone: [phone]: [phone] [facility] [place] [address] [place]Phone: [phone]: [phone]    Social Determinants of Health (SDOH) Interventions    Readmission Risk Interventions Readmission Risk Prevention Plan 04/24/2021  Transportation Screening Complete  PCP or Specialist Appt within 3-5 Days Complete  [place] or [place]  Social Work Consult for [place]anning/Counseling Complete  Palliative Care Screening Not Applicable  Medication Review (Press photographer Referral to Pharmacy

## 2021-11-02 NOTE — Progress Notes (Signed)
Progress Note   Patient: Anthony [name] NAT:[id number] DOB: 06-18-1941 DOA: 10/30/2021     1 DOS: the patient was seen and examined on 11/02/2021   Brief hospital course: The patient is a 81 year old elderly African-American male with past medical history significant for but not to end-stage renal disease on hemodialysis, Monday Wednesday, Friday, history of latent TB, diabetes mellitus type 2, history of chronic diastolic CHF, history of COPD as well as dementia and other comorbidities who presented with chest pain after his dialysis session.  He was about 3 and half hours into his dialysis session on 10/30/21 when he felt an acute anterior chest like pressure and felt as if "somebody is sitting on his chest".  He also stated some dyspnea and shortness of breath as well as having some left lower extremity calf tenderness.  He reports compliance with his medications and was a current tobacco user and smokes a pack every 2 to 3 days.  In the ED he was initially documented hypotensive and hypoxic but his blood pressure was erroneously documented it was normal while he was in the ED.  Continued to have intermittent chest pain in the ED in the ED physician discussed with cardiology who recommended observation.  Troponin was flat at 146 and trended down to 125.  EKG was showing some inverted T waves that were similar to priors.  His BUN and creatinine were a little elevated.  He was admitted for his chest pain and cardiology evaluated and an echocardiogram was done.  Cardiology felt that his chest pain was not indicative of ACS given that he had a cardiac catheterization in June that showed nonobstructive coronary disease and they recommended no pursuit of ischemic evaluation but recommended resuming aspirin 81 mg p.o. daily as well as statin.  PT OT evaluated recommending home health however family feels that they cannot take care of the patient so a SNF work-up was been initiated.  Patient was dialyzed today and had  similar symptoms and had some dyspnea and abdominal tenderness during his dialysis session and it was felt that he had too much fluid pulled during his hemodialysis and they cut his treatment short due to his cramping and hypotension and they stopped his ultrafiltration with hemodialysis.  Subsequently nephrology gave the patient back 500 cc of fluid and he felt better.  Assessment and Plan: * Chest pain- (present on admission) -Had Chest Pain/Discomfort with Dyspnea after Dialysis two Dialysis sessions in a row -Troponin went from 146 -> 113 -> 125 and similar to previous labs and suspected chronically due to ESRD -Was Chest Pain free -EKG similar to prior with inverted inferior T waves -Had a Mycardio perfusion study on 6/21 which showed a small defect of mderate severity in the apical inferior location -ECHO in 7/22 showed EF of 50-55% and Grade 2 DD with no significant valvular abnormalities -Cardiology consulted and he was evaluated for his recurrent chest pain and his symptoms and troponins were chronically elevated.  Patient had a cardiac catheterization back in June which showed nonobstructive coronary disease and a cardiology team recommended no further ischemic evaluation recommended continue home dose of aspirin 81 mg and increase the statin to 80 mg p.o. daily -Cardiology recommended checking lipids and liver panel in 8 weeks and recommended no further testing -Echocardiogram done and showed EF 50-55% and the Left Ventricl had no wall motion abnormalities and there is moderate concentric left ventricular hypertrophy and the ventricular diastolic parameters were consistent with grade 1 diastolic dysfunction.  Patient had severe MAC with protrusion with calcification on the annulus and into the left atrium and the cardiac echocardiogram showed no real significant change from prior study -Follow up with the Cardiology team in 4 to 6 weeks  Hyperphosphatemia -Patient's Phos Level went from  6.4 -> 5.3  -Continue to Monitor and Trend -Likely to further be corrected in Dialysis.  -Repeat Phos Level in the AM   Leg pain -LE Duplex done and showed "There is no evidence of deep vein thrombosis in the lower extremity. However, portions of this examination were limited- see technologist comments above."   Hypokalemia -Patient is K+ was 3.1 on admission and improved to 3.8 yesterday and is 3.9 today -Continue monitor and replete as necessary mag level is now 2.2 again -Repeat CMP in a.m.  Dyspnea -Was dyspneic during his dialysis session yesterday as they feel that he may have had too much fluid pulled off of them -continue supplemental oxygen as needed -Dyspnea resolved and he is not on O2  TB lung, latent- (present on admission) -Resume home isoniazid 300 mg p.o. daily as well as home pyridoxine 50 mg p.o. daily  Dementia without behavioral disturbance ([place])- (present on admission) -Continue with Memantine 5 mg p.o. daily  Prolonged QT interval- (present on admission) -He had prolonged QTc of 549 admission -Replating and monitoring electrolytes; K+ is 3.9, and Mag is 2.2 -Avoiding QT prolongation medications  COPD (chronic obstructive pulmonary disease) ([place])- (present on admission) -Continue with Symbicort substitution with Breo Ellipta and also continue Spiriva Respimat and Albuterol Inhaler 2 puffs into lungs every 4 hours as needed  PAF (paroxysmal atrial fibrillation) ([place])- (present on admission) - Continue telemetry monitoring and patient is rate controlled is not on anticoagulation due to recurrent GI bleeds  Chronic diastolic heart failure ([place])- (present on admission) -Repeat Echocardiogram showed diastolic dysfunction consistent with grade 1 -Appears currently compensated and he has dialysis for volume maintenance -Nephrology likely dialyzed too much and had too much ultrafiltration so they given back 500 mL today -Strict I's and O's and daily weights;  Patient is -124 mL since admission and Weight is down 5 lbs  Anemia of chronic disease- (present on admission) - Patient's hemoglobin/hematocrit went from 10.9/37.5 -> 11.3/37.3 -> 10.9/36.2 -> 11.4/37.4 -Check anemia panel in AM. -Continue to monitor for signs and symptoms bleeding; currently no overt bleeding noted -Repeat CBC in a.m.   ESRD (end stage renal disease) on dialysis [facility]) -Nephrology consulted for further evaluation and maintenance of hemodialysis -After further discussion with nephrology they feel that they pulled too much fluid off of him this morning and had to give him 500 mL back.  He was 3 kg under his dry weight -Further care per nephrology and his usual hemodialysis treatment was cut short due to his cramping and hypotension as well as dyspnea; Next dialysis session scheduled for 11/04/21 as he awaits SNF placement -Nephrology recommends continue home binders and calcitriol  -Patient's BUN/creatinine went from 9/5.10 -> 22/8.79 -> 24/8.63 -Continue to monitor trend renal function carefully and repeat CMP   Subjective: Seen and examined at bedside he is doing much better today than he was yesterday.  No nausea or vomiting.  Did not have any feelings of shortness of breath or abdominal discomfort.  No other concerns or complaints at this time.  Physical Exam: Vitals:   11/01/21 1943 11/02/21 0003 11/02/21 0343 11/02/21 0810  BP: 135/80 (!) 145/79 139/74   Pulse: 83 83 79   Resp: 19 20  20   Temp: 97.7 F (36.5 C) 97.7 F (36.5 C) (!) 97.5 F (36.4 C)   TempSrc: Oral Oral Oral   SpO2: 92% 96% 92% 93%  Weight:   62 kg   Height:       Examination: Physical Exam:  Constitutional: Elderly AAM in NAD Respiratory: Diminished to auscultation bilaterally, no wheezing, rales, rhonchi or crackles. Normal respiratory effort and patient is not tachypenic. No accessory muscle use. Unlabored breathing  Cardiovascular: RRR, no murmurs / rubs / gallops. S1 and S2  auscultated. No extremity edema.  Abdomen: Soft, non-tender, non-distended. Bowel sounds positive.  GU: Deferred. Musculoskeletal: No clubbing / cyanosis of digits/nails. No joint deformity upper and lower extremities. But has a Right IJ TDC  Data Reviewed:  I have independently reviewed and interpreted the patient's CMP and CBC  Patient's BUN/creatinine is now 24/3.63 and his hemoglobin/hematocrit is relatively stable  Family Communication: No family currently at bedside  Disposition: Status is: Inpatient Remains inpatient appropriate because: Needs placement for SNF and cannot be done until Monday   Planned Discharge Destination: Skilled nursing facility  DVT Prophylaxis: Heparin 5,000 sq qHs  Author: Raiford Noble, DO [facility] 11/02/2021 3:53 PM  For on call review [email or web address].

## 2021-11-02 NOTE — Assessment & Plan Note (Addendum)
-  Patient's Phos Level went from 6.4 -> 5.3  -> 6.5 -> 5.5 -Continue to Monitor and Trend -Likely to further be corrected in Dialysis.  -Repeat Phos Level within 1 week

## 2021-11-03 LAB — CBC WITH DIFFERENTIAL/PLATELET
Abs Immature Granulocytes: 0.06 10*3/uL (ref 0.00–0.07)
Basophils Absolute: 0 10*3/uL (ref 0.0–0.1)
Basophils Relative: 0 %
Eosinophils Absolute: 0.2 10*3/uL (ref 0.0–0.5)
Eosinophils Relative: 3 %
HCT: 36.7 % — ABNORMAL LOW (ref 39.0–52.0)
Hemoglobin: 11 g/dL — ABNORMAL LOW (ref 13.0–17.0)
Immature Granulocytes: 1 %
Lymphocytes Relative: 19 %
Lymphs Abs: 1.1 10*3/uL (ref 0.7–4.0)
MCH: 26.9 pg (ref 26.0–34.0)
MCHC: 30 g/dL (ref 30.0–36.0)
MCV: 89.7 fL (ref 80.0–100.0)
Monocytes Absolute: 0.6 10*3/uL (ref 0.1–1.0)
Monocytes Relative: 11 %
Neutro Abs: 3.8 10*3/uL (ref 1.7–7.7)
Neutrophils Relative %: 66 %
Platelets: 263 10*3/uL (ref 150–400)
RBC: 4.09 MIL/uL — ABNORMAL LOW (ref 4.22–5.81)
RDW: 19.9 % — ABNORMAL HIGH (ref 11.5–15.5)
WBC: 5.8 10*3/uL (ref 4.0–10.5)
nRBC: 0 % (ref 0.0–0.2)

## 2021-11-03 LAB — COMPREHENSIVE METABOLIC PANEL
ALT: <5 U/L (ref 0–44)
AST: 9 U/L — ABNORMAL LOW (ref 15–41)
Albumin: 2.9 g/dL — ABNORMAL LOW (ref 3.5–5.0)
Alkaline Phosphatase: 80 U/L (ref 38–126)
Anion gap: 16 — ABNORMAL HIGH (ref 5–15)
BUN: 40 mg/dL — ABNORMAL HIGH (ref 8–23)
CO2: 24 mmol/L (ref 22–32)
Calcium: 8.7 mg/dL — ABNORMAL LOW (ref 8.9–10.3)
Chloride: 100 mmol/L (ref 98–111)
Creatinine, Ser: 11.02 mg/dL — ABNORMAL HIGH (ref 0.61–1.24)
GFR, Estimated: 4 mL/min — ABNORMAL LOW (ref >60)
Glucose, Bld: 123 mg/dL — ABNORMAL HIGH (ref 70–99)
Potassium: 4 mmol/L (ref 3.5–5.1)
Sodium: 140 mmol/L (ref 135–145)
Total Bilirubin: 0.2 mg/dL — ABNORMAL LOW (ref 0.3–1.2)
Total Protein: 6.9 g/dL (ref 6.5–8.1)

## 2021-11-03 LAB — MAGNESIUM: Magnesium: 2.3 mg/dL (ref 1.7–2.4)

## 2021-11-03 LAB — PHOSPHORUS: Phosphorus: 6.5 mg/dL — ABNORMAL HIGH (ref 2.5–4.6)

## 2021-11-03 NOTE — Progress Notes (Addendum)
°  [facility] Progress Note   Subjective: Seen in room. No c/o today. Feeling good. Going to SNF/ rehab.   Objective Vitals:   11/02/21 2100 11/03/21 0436 11/03/21 0732 11/03/21 1106  BP:  126/72  137/69  Pulse:  77  76  Resp:  17  17  Temp: 98.3 F (36.8 C) 98.2 F (36.8 C)  98.1 F (36.7 C)  TempSrc: Oral Oral  Oral  SpO2:  98% 98% 93%  Weight:      Height:         Additional Objective Labs: Basic Metabolic Panel: Recent Labs  Lab 10/31/21 1823 11/02/21 0332 11/03/21 0402  NA 140 136 140  K 3.8 3.9 4.0  CL 98 97* 100  CO2 28 26 24   GLUCOSE 109* 100* 123*  BUN 22 24* 40*  CREATININE 8.79* 8.63* 11.02*  CALCIUM 8.6* 8.8* 8.7*  PHOS 6.4* 5.3* 6.5*    CBC: Recent Labs  Lab 10/31/21 1823 11/01/21 0627 11/01/21 0801 11/02/21 0332 11/03/21 0402  WBC 4.9 4.8 5.1 6.2 5.8  NEUTROABS 3.0  --   --  3.8 3.8  HGB 10.9* 11.3* 10.9* 11.4* 11.0*  HCT 37.5* 37.3* 36.2* 37.4* 36.7*  MCV 92.1 89.9 91.9 89.5 89.7  PLT 292 267 287 249 263    Blood Culture No results found for: SDES, SPECREQUEST, CULT, REPTSTATUS   Physical Exam General: Elderly man, nad  Heart: RRR No m,r,g  Lungs: Clear bilaterally  Abdomen: soft non-tender  Extremities: No LE edema  Dialysis Access: R IJ TDC   Medications:   aspirin EC  81 mg Oral Daily   atorvastatin  80 mg Oral Daily   Chlorhexidine Gluconate Cloth  6 each Topical Q0600   fluticasone furoate-vilanterol  1 puff Inhalation Daily   heparin  5,000 Units Subcutaneous Q8H   isoniazid  300 mg Oral Daily   memantine  5 mg Oral Daily   pantoprazole  40 mg Oral Daily   pyridOXINE  50 mg Oral Daily   umeclidinium bromide  1 puff Inhalation Daily    Dialysis Orders: [facility] M[facility]   4h  67kg  400/800  2/2 bath  TDC  Hep 3000  ESA: Mircera 225 q 2 wks. Last 1/30 VDRA: Calcitriol 2.25 TIW    Assessment/Plan: Chest pain/dyspnea (reason for admission) -- Hx nonobs CAD, CHF. Seen by cardiology, not planning further  evaluation.  Resolved. No c/os this am.  ESRD -  HD MWF. Short HD yesterday d/t cramping/hypotension. No dialysis indications today. Next HD 2/13.  Resp - on O2 here, doesn't wear at home. CXR clear as a bell on admission. Will wean to RA.  Hypotension/volume - below dry wt, felt awful on Sat hd due to over-ultrafiltration. Wean O2 off, maybe lower dry wt but won't pull fluid w/ next HD.  Anemia  - Stable. No ESA needs  Metabolic bone disease -  Continue home binders/calcitriol if stays in hospital DM -per primary  Debility PT/OT per primary  Dispo - will need placement it appears  Kelly Splinter, MD 11/03/2021, 1:08 PM

## 2021-11-03 NOTE — Plan of Care (Signed)

## 2021-11-03 NOTE — Progress Notes (Signed)
Progress Note   Patient: Anthony [name] HQI:[id number] DOB: September 01, 1941 DOA: 10/30/2021     2 DOS: the patient was seen and examined on 11/03/2021   Brief hospital course: The patient is a 81 year old elderly African-American male with past medical history significant for but not to end-stage renal disease on hemodialysis, Monday Wednesday, Friday, history of latent TB, diabetes mellitus type 2, history of chronic diastolic CHF, history of COPD as well as dementia and other comorbidities who presented with chest pain after his dialysis session.  He was about 3 and half hours into his dialysis session on 10/30/21 when he felt an acute anterior chest like pressure and felt as if "somebody is sitting on his chest".  He also stated some dyspnea and shortness of breath as well as having some left lower extremity calf tenderness.  He reports compliance with his medications and was a current tobacco user and smokes a pack every 2 to 3 days.  In the ED he was initially documented hypotensive and hypoxic but his blood pressure was erroneously documented it was normal while he was in the ED.  Continued to have intermittent chest pain in the ED in the ED physician discussed with cardiology who recommended observation.  Troponin was flat at 146 and trended down to 125.  EKG was showing some inverted T waves that were similar to priors.  His BUN and creatinine were a little elevated.  He was admitted for his chest pain and cardiology evaluated and an echocardiogram was done.  Cardiology felt that his chest pain was not indicative of ACS given that he had a cardiac catheterization in June that showed nonobstructive coronary disease and they recommended no pursuit of ischemic evaluation but recommended resuming aspirin 81 mg p.o. daily as well as statin.  PT OT evaluated recommending home health however family feels that they cannot take care of the patient so a SNF work-up was been initiated.  Patient was dialyzed today and had  similar symptoms and had some dyspnea and abdominal tenderness during his dialysis session and it was felt that he had too much fluid pulled during his hemodialysis and they cut his treatment short due to his cramping and hypotension and they stopped his ultrafiltration with hemodialysis.  Subsequently nephrology gave the patient back 500 cc of fluid and he felt better.  Currently doing well but for some reason he was placed back on supplemental oxygen this morning but does not really dyspneic or hypoxic.  We will wean.  Nephrology planning on dialysis tomorrow and they are recommending lowering the dry weight but not pull any fluid off with a next hemodialysis session.  Anticipating discharge going to SNF soon.  Assessment and Plan: * Chest pain- (present on admission) -Had Chest Pain/Discomfort with Dyspnea after Dialysis two Dialysis sessions in a row -Troponin went from 146 -> 113 -> 125 and similar to previous labs and suspected chronically due to ESRD -Was Chest Pain free -EKG similar to prior with inverted inferior T waves -Had a Mycardio perfusion study on 6/21 which showed a small defect of mderate severity in the apical inferior location -ECHO in 7/22 showed EF of 50-55% and Grade 2 DD with no significant valvular abnormalities -Cardiology consulted and he was evaluated for his recurrent chest pain and his symptoms and troponins were chronically elevated.  Patient had a cardiac catheterization back in June which showed nonobstructive coronary disease and a cardiology team recommended no further ischemic evaluation recommended continue home dose of aspirin 81 mg  and increase the statin to 80 mg p.o. daily -Cardiology recommended checking lipids and liver panel in 8 weeks and recommended no further testing -Echocardiogram done and showed EF 50-55% and the Left Ventricl had no wall motion abnormalities and there is moderate concentric left ventricular hypertrophy and the ventricular diastolic  parameters were consistent with grade 1 diastolic dysfunction.  Patient had severe MAC with protrusion with calcification on the annulus and into the left atrium and the cardiac echocardiogram showed no real significant change from prior study -Follow up with the Cardiology team in 4 to 6 weeks  Hyperphosphatemia -Patient's Phos Level went from 6.4 -> 5.3  -> 6.5 -Continue to Monitor and Trend -Likely to further be corrected in Dialysis.  -Repeat Phos Level in the AM   Leg pain -LE Duplex done and showed "There is no evidence of deep vein thrombosis in the lower extremity. However, portions of this examination were limited- see technologist comments above."   Hypokalemia -Patient is K+ is now 4.0 -Continue monitor and replete as necessary mag level is now 2.3 -Repeat CMP in a.m.  Dyspnea -Was dyspneic during his dialysis session as they feel that he may have had too much fluid pulled off of them -continue supplemental oxygen as needed and for some reason he was back on it and not complaining of SOB -SpO2: 93 % O2 Flow Rate (L/min): 2 L/min; Wean to Room Air  -Dyspnea resolved but was wearing Supplemental O2 and his Sula was not in his nose  TB lung, latent- (present on admission) -Resume home isoniazid 300 mg p.o. daily as well as home pyridoxine 50 mg p.o. daily  Dementia without behavioral disturbance (HCC)- (present on admission) -Continue with Memantine 5 mg p.o. daily  Prolonged QT interval- (present on admission) -He had prolonged QTc of 549 admission -Replating and monitoring electrolytes; K+ is 4.0 now, and Mag is 2.3 -Avoiding QT prolongation medications  COPD (chronic obstructive pulmonary disease) ([place])- (present on admission) -Continue with Symbicort substitution with Breo Ellipta and also continue Spiriva Respimat and Albuterol Inhaler 2 puffs into lungs every 4 hours as needed -Continue to Monitor Respiratory Status carefully   PAF (paroxysmal atrial fibrillation)  ([place])- (present on admission) -Continue telemetry monitoring and patient is rate controlled is not on anticoagulation due to recurrent GI bleeds -HR is ranging from 51-76  Chronic diastolic heart failure ([place])- (present on admission) -Repeat Echocardiogram showed diastolic dysfunction consistent with grade 1 -Appears currently compensated and he has dialysis for volume maintenance -Nephrology likely dialyzed too much and had too much ultrafiltration so they given back 500 mL today -Strict I's and O's and daily weights; Patient is +416 mL since admission and Weight is down 5 lbs since admission  Anemia of chronic disease- (present on admission) - Patient's hemoglobin/hematocrit went from 10.9/37.5 -> 11.3/37.3 -> 10.9/36.2 -> 11.4/37.4 -> 11.0/36.7 -Check anemia panel in AM. -Continue to monitor for signs and symptoms bleeding; currently no overt bleeding noted -Repeat CBC in a.m.   ESRD (end stage renal disease) on dialysis [facility]) -Nephrology consulted for further evaluation and maintenance of hemodialysis -After further discussion with nephrology they feel that they pulled too much fluid off of him this morning and had to give him 500 mL back.  He was 3 kg under his dry weight -Further care per nephrology and his usual hemodialysis treatment was cut short due to his cramping and hypotension as well as dyspnea; Next dialysis session scheduled for 11/04/21 as he awaits SNF placement -Nephrology recommends  continue home binders and calcitriol  -Patient's BUN/creatinine went from 9/5.10 -> 22/8.79 -> 24/8.63 -> 40/11.02 -Continue to monitor trend renal function carefully and repeat CMP  Subjective: Seen and examined at bedside and he is doing relatively well.  Wanting some graham crackers.  No chest pain or shortness of breath.  Felt okay.  No other concerns or complaints at this time.  Physical Exam: Vitals:   11/02/21 2100 11/03/21 0436 11/03/21 0732 11/03/21 1106  BP:  126/72  137/69   Pulse:  77  76  Resp:  17  17  Temp: 98.3 F (36.8 C) 98.2 F (36.8 C)  98.1 F (36.7 C)  TempSrc: Oral Oral  Oral  SpO2:  98% 98% 93%  Weight:      Height:       Examination: Physical Exam:  Constitutional: Elderly thin African-American male currently in no acute distress  Respiratory: Diminished to auscultation bilaterally with coarse breath sounds, no wheezing, rales, rhonchi or crackles. Normal respiratory effort and patient is not tachypenic. No accessory muscle use. Has supplemental O2 but nasal cannula is above his nose  Cardiovascular: RRR, no murmurs / rubs / gallops. S1 and S2 auscultated. No appreciable LE edema Abdomen: Soft, non-tender, non-distended. Bowel sounds positive.  GU: Deferred. Psychiatric: Normal judgment and insight. Alert and oriented x 3. Normal mood and appropriate affect.   Data Reviewed:  I have independently interpreted and reviewed the patient's laboratory data including his CBC and CMP  Patient's BUN/creatinine is now 40/11.02 and phosphorus level 6.5.  Hemoglobin/hematocrit is relatively stable from yesterday but is 11.0/36.7 today  Family Communication: No family present at bedside   Disposition: Status is: Inpatient Remains inpatient appropriate because: Awaiting SNF placement   Planned Discharge Destination: Skilled nursing facility  DVT Prophylaxis: Heparin 5,000 units sq q8h  Author: Raiford Noble, DO [facility]  11/03/2021 2:40 PM  For on call review [email or web address].

## 2021-11-04 ENCOUNTER — Inpatient Hospital Stay (HOSPITAL_COMMUNITY): Payer: Medicare Other

## 2021-11-04 ENCOUNTER — Encounter: Payer: Self-pay | Admitting: Infectious Disease

## 2021-11-04 DIAGNOSIS — Z7185 Encounter for immunization safety counseling: Secondary | ICD-10-CM | POA: Insufficient documentation

## 2021-11-04 DIAGNOSIS — J449 Chronic obstructive pulmonary disease, unspecified: Secondary | ICD-10-CM

## 2021-11-04 HISTORY — DX: Encounter for immunization safety counseling: Z71.85

## 2021-11-04 LAB — RETICULOCYTES
Immature Retic Fract: 29.2 % — ABNORMAL HIGH (ref 2.3–15.9)
RBC.: 3.71 MIL/uL — ABNORMAL LOW (ref 4.22–5.81)
Retic Count, Absolute: 82.4 10*3/uL (ref 19.0–186.0)
Retic Ct Pct: 2.2 % (ref 0.4–3.1)

## 2021-11-04 LAB — COMPREHENSIVE METABOLIC PANEL
ALT: <5 U/L (ref 0–44)
AST: 8 U/L — ABNORMAL LOW (ref 15–41)
Albumin: 2.8 g/dL — ABNORMAL LOW (ref 3.5–5.0)
Alkaline Phosphatase: 72 U/L (ref 38–126)
Anion gap: 16 — ABNORMAL HIGH (ref 5–15)
BUN: 56 mg/dL — ABNORMAL HIGH (ref 8–23)
CO2: 24 mmol/L (ref 22–32)
Calcium: 8.5 mg/dL — ABNORMAL LOW (ref 8.9–10.3)
Chloride: 99 mmol/L (ref 98–111)
Creatinine, Ser: 12.82 mg/dL — ABNORMAL HIGH (ref 0.61–1.24)
GFR, Estimated: 4 mL/min — ABNORMAL LOW (ref >60)
Glucose, Bld: 114 mg/dL — ABNORMAL HIGH (ref 70–99)
Potassium: 4.1 mmol/L (ref 3.5–5.1)
Sodium: 139 mmol/L (ref 135–145)
Total Bilirubin: 0.6 mg/dL (ref 0.3–1.2)
Total Protein: 6.6 g/dL (ref 6.5–8.1)

## 2021-11-04 LAB — CBC WITH DIFFERENTIAL/PLATELET
Abs Immature Granulocytes: 0.05 10*3/uL (ref 0.00–0.07)
Basophils Absolute: 0 10*3/uL (ref 0.0–0.1)
Basophils Relative: 1 %
Eosinophils Absolute: 0.2 10*3/uL (ref 0.0–0.5)
Eosinophils Relative: 3 %
HCT: 32.5 % — ABNORMAL LOW (ref 39.0–52.0)
Hemoglobin: 10.2 g/dL — ABNORMAL LOW (ref 13.0–17.0)
Immature Granulocytes: 1 %
Lymphocytes Relative: 15 %
Lymphs Abs: 1 10*3/uL (ref 0.7–4.0)
MCH: 27.9 pg (ref 26.0–34.0)
MCHC: 31.4 g/dL (ref 30.0–36.0)
MCV: 88.8 fL (ref 80.0–100.0)
Monocytes Absolute: 1 10*3/uL (ref 0.1–1.0)
Monocytes Relative: 15 %
Neutro Abs: 4.3 10*3/uL (ref 1.7–7.7)
Neutrophils Relative %: 65 %
Platelets: 238 10*3/uL (ref 150–400)
RBC: 3.66 MIL/uL — ABNORMAL LOW (ref 4.22–5.81)
RDW: 19.6 % — ABNORMAL HIGH (ref 11.5–15.5)
WBC: 6.5 10*3/uL (ref 4.0–10.5)
nRBC: 0 % (ref 0.0–0.2)

## 2021-11-04 LAB — IRON AND TIBC
Iron: 24 ug/dL — ABNORMAL LOW (ref 45–182)
Saturation Ratios: 11 % — ABNORMAL LOW (ref 17.9–39.5)
TIBC: 213 ug/dL — ABNORMAL LOW (ref 250–450)
UIBC: 189 ug/dL

## 2021-11-04 LAB — CBC
HCT: 32.7 % — ABNORMAL LOW (ref 39.0–52.0)
Hemoglobin: 9.9 g/dL — ABNORMAL LOW (ref 13.0–17.0)
MCH: 26.9 pg (ref 26.0–34.0)
MCHC: 30.3 g/dL (ref 30.0–36.0)
MCV: 88.9 fL (ref 80.0–100.0)
Platelets: 267 10*3/uL (ref 150–400)
RBC: 3.68 MIL/uL — ABNORMAL LOW (ref 4.22–5.81)
RDW: 19.9 % — ABNORMAL HIGH (ref 11.5–15.5)
WBC: 6.8 10*3/uL (ref 4.0–10.5)
nRBC: 0 % (ref 0.0–0.2)

## 2021-11-04 LAB — VITAMIN B12: Vitamin B-12: 313 pg/mL (ref 180–914)

## 2021-11-04 LAB — FERRITIN: Ferritin: 100 ng/mL (ref 24–336)

## 2021-11-04 LAB — RESP PANEL BY RT-PCR (FLU A&B, COVID) ARPGX2
Influenza A by PCR: NEGATIVE
Influenza B by PCR: NEGATIVE
SARS Coronavirus 2 by RT PCR: NEGATIVE

## 2021-11-04 LAB — FOLATE: Folate: 5.7 ng/mL — ABNORMAL LOW (ref >5.9)

## 2021-11-04 LAB — MAGNESIUM: Magnesium: 2.2 mg/dL (ref 1.7–2.4)

## 2021-11-04 LAB — PHOSPHORUS: Phosphorus: 5.5 mg/dL — ABNORMAL HIGH (ref 2.5–4.6)

## 2021-11-04 MED ORDER — HEPARIN SODIUM (PORCINE) 1000 UNIT/ML IJ SOLN: 1500.0000 [IU] | Freq: Once | INTRAMUSCULAR | Status: AC

## 2021-11-04 MED ORDER — HEPARIN SODIUM (PORCINE) 1000 UNIT/ML IJ SOLN
INTRAMUSCULAR | Status: AC
  Administered 2021-11-04: 1500 [IU] via INTRAVENOUS
  Filled 2021-11-04: qty 3

## 2021-11-04 MED ORDER — HEPARIN SODIUM (PORCINE) 1000 UNIT/ML DIALYSIS: 1500.0000 [IU] | INTRAMUSCULAR | Status: DC | PRN

## 2021-11-04 MED ORDER — SODIUM CHLORIDE 0.9 % IV SOLN
250.0000 mg | INTRAVENOUS | Status: DC
  Administered 2021-11-04: 250 mg via INTRAVENOUS
  Filled 2021-11-04: qty 20

## 2021-11-04 NOTE — Progress Notes (Unsigned)
Subjective:   Chief complaint:   Patient ID: Anthony [name], male    DOB: 1941-02-11, 81 y.o.   MRN: [id number]  HPI  Anthony [name] is a 81 year old black man with a past medical history significant for congestive heart failure, end-stage renal disease on hemodialysis on Monday Wednesday Friday, hypertension stroke recent GI bleed dementia on Namenda who recently had a positive QuantiFERON gold test.  The reason for the QuantiFERON gold test was that he was seen by a physician at [facility] in [place].  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  QuantiFERON was repeated and positive at [facility].  Chest x-ray was negative for any evidence of tuberculosis.  He did have chronic cough which is in part due to his continued smoking and COPD.  He was hospitalized here at [facility] with shortness of breath and need for hemodialysis.  He had apparently just moved from [place] to [place].  Lab done here included HIV test which was negative.  He himself does not know why a PPD was performed in 2021.  He was born in [place] and has largely lived in [place] and travel to the [place].  He was incarcerated for 2 years roughly 20 years ago.  He has never known himself to be around someone with known tuberculosis.  Denies fevers chills nausea malaise or weight loss.  He does not have a history of homelessness.  He has not traveled outside [place].  We initiated isoniazid with vitamin B6 at last visit.  He is tolerate this quite well.  He says he feels like he has better energy and feels stronger now.      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   Stroke ([place])    TB lung, latent 05/16/2021    Past Surgical History:  Procedure Laterality Date   AV FISTULA PLACEMENT Right 05/14/2021   Procedure: INSERTION OF ARTERIOVENOUS (AV) GORE-TEX GRAFT ARM;  Surgeon: Angelia Mould, MD;  Location[facility] OR;  Service: Vascular;  Laterality: Right;   ENTEROSCOPY N/A 04/18/2021   Procedure: ENTEROSCOPY;  Surgeon: Jackquline Denmark, MD;  Location: [facility];  Service: Endoscopy;  Laterality: N/A;   HOT HEMOSTASIS N/A 04/18/2021   Procedure: HOT HEMOSTASIS (ARGON PLASMA COAGULATION/BICAP);  Surgeon: Jackquline Denmark, MD;  Location: [facility] ENDOSCOPY;  Service: Endoscopy;  Laterality: N/A;   SUBMUCOSAL TATTOO INJECTION  04/18/2021   Procedure: SUBMUCOSAL TATTOO INJECTION;  Surgeon: Jackquline Denmark, MD;  Location: [facility] ENDOSCOPY;  Service: Endoscopy;;    Family History  Problem Relation Age of Onset   Hypertension Mother    Hypertension Father       Social History   Socioeconomic History   Marital status: Married    Spouse name: Not on file   Number of children: Not on file   Years of education: Not on file   Highest  education level: Not on file  Occupational History   Not on file  Tobacco Use   Smoking status: Every Day    Packs/day: 0.25    Years: 61.00    Pack years: 15.25    Types: Cigarettes   Smokeless tobacco: Never  Vaping Use   Vaping Use: Never used  Substance and Sexual Activity   Alcohol use: Not Currently    Comment: quit 2021   Drug use: Not Currently   Sexual activity: Not Currently  Other Topics Concern   Not on file  Social History Narrative   Not on file   Social Determinants of Health   Financial Resource Strain: Not on file  Food Insecurity: Not on file  Transportation Needs: Not on file  Physical Activity: Not on file   Stress: Not on file  Social Connections: Not on file    No Known Allergies  No current facility-administered medications for this visit. No current outpatient medications on file.  Facility-Administered Medications Ordered in Other Visits:    acetaminophen (TYLENOL) tablet 650 mg, 650 mg, Oral, Q4H PRN, Tu, Ching T, DO, 650 mg at 11/04/21 2108   albuterol (PROVENTIL) (2.5 MG/3ML) 0.083% nebulizer solution 2.5 mg, 2.5 mg, Inhalation, Q4H PRN, Sheikh, Omair Latif, DO   aspirin EC tablet 81 mg, 81 mg, Oral, Daily, Raiford Noble Latif, DO, 81 mg at 11/04/21 1213   atorvastatin (LIPITOR) tablet 80 mg, 80 mg, Oral, Daily, Raiford Noble Latif, DO, 80 mg at 11/04/21 1213   Chlorhexidine Gluconate Cloth 2 % PADS 6 each, 6 each, Topical, Q0600, Edrick Oh, MD, 6 each at 11/04/21 1214   ferric gluconate (FERRLECIT) 250 mg in sodium chloride 0.9 % 250 mL IVPB, 250 mg, Intravenous, Q M,W,F-HD, Gean Quint, MD, Stopped at 11/04/21 1220   fluticasone furoate-vilanterol (BREO ELLIPTA) 200-25 MCG/ACT 1 puff, 1 puff, Inhalation, Daily, Sheikh, Omair Latif, DO, 1 puff at 11/03/21 0730   heparin injection 5,000 Units, 5,000 Units, Subcutaneous, Q8H, Tu, Ching T, DO, 5,000 Units at 11/04/21 2108   isoniazid (NYDRAZID) tablet 300 mg, 300 mg, Oral, Daily, Sheikh, Omair Latif, DO, 300 mg at 11/04/21 1215   memantine (NAMENDA) tablet 5 mg, 5 mg, Oral, Daily, Opyd, Ilene Qua, MD, 5 mg at 11/04/21 1213   nitroGLYCERIN (NITROSTAT) SL tablet 0.4 mg, 0.4 mg, Sublingual, Q5 min PRN, Regan Lemming, MD   pantoprazole (PROTONIX) EC tablet 40 mg, 40 mg, Oral, Daily, Sheikh, Omair Latif, DO, 40 mg at 11/04/21 1213   pyridOXINE (VITAMIN B-6) tablet 50 mg, 50 mg, Oral, Daily, Sheikh, Omair Latif, DO, 50 mg at 11/04/21 1215   traZODone (DESYREL) tablet 50-100 mg, 50-100 mg, Oral, QHS PRN, Opyd, Ilene Qua, MD, 50 mg at 10/31/21 2344   umeclidinium bromide (INCRUSE ELLIPTA) 62.5 MCG/ACT 1 puff, 1 puff, Inhalation, Daily,  Raiford Noble Harrells, DO, 1 puff at 11/03/21 4818    Review of Systems     Objective:   Physical Exam       Assessment & Plan:   Latent TB:  I am rechecking CMP  ESRD: on hD  COPD:  Smoking   Dementia:  Vaccine counseling:

## 2021-11-04 NOTE — TOC Progression Note (Addendum)
Transition of Care [facility]) - Progression Note    Patient Details  Name: Alison Breeding MRN: [id number] Date of Birth: 1941/02/22  Transition of Care [facility]) CM/SW Contact  Reece Agar, [place] Phone Number: 11/04/2021, 12:03 PM  Clinical Narrative:    CSW contacted [facility] to inquire about inpatient rehab, there was no answer. CSW will follow up. [facility] and find out [facility] CSW is Hilton Sinclair [phone] ext. [phone]. Pt is not fully covered by the [facility] and will have to use his medicare. CSW reached out to pt spouse and explained that pt would have to go to a SNF and not the inpatient rehab at the [place]. CSW will follow up with pt spouse once facility is confirmed. [facility] and [facility] are not able to accept pt due to HD, CSW has contacted [facility], [facility] and [facility] for bed availability today. [facility] is to able to accept pt bc of high risk with smoking, HD, and confusion.  [facility] is willing to accept pt with the understanding that he will DC and will not need LTC, also pt is understanding that he will not be able to smoke or have any smoking products on campus. CSW spoke with pt spouse who has stated that the pt will not have a problem not smoking. CSW will follow up with pt when he is a orientedx4 to confirm the no smoking as well. Pt was accepted to [facility] as well, CSW will follow up with bed for [facility] if suggested by family. CSW spoke with spouse in room about LTC at the [place] CSW informed pt spouse that family would need to set up LTC. They have submitted an application today and will wait until they hear back from [facility].  CSW spoke with Juliann Pulse at [facility] to confirm that pt could DC first thing in the morning since they cannot take pt today. CSW will contact [facility] to have transportation first thing in the morning.  CSW contacted [facility] they do not have any availably tomorrow at all, CSW will contact [facility] first thing in the morning  after date is changed on DC summary.  CSW spoke with [facility] transportation for pt HD they will follow up tomorrow to confirm pick up but they have the information in their system for pick up at [facility] starting Wednesday.    Expected Discharge Plan: [place] Barriers to Discharge: Continued Medical Work up  Expected Discharge Plan and Services Expected Discharge Plan: [place] In-house Referral: Clinical Social Work   Post Acute Care Choice: [place] Living arrangements for the past 2 months: [place]                                       Social Determinants of Health (SDOH) Interventions    Readmission Risk Interventions Readmission Risk Prevention Plan 04/24/2021  Transportation Screening Complete  PCP or Specialist Appt within 3-5 Days Complete  HRI or Home Care Consult Complete  Social Work Consult for [place] Planning/Counseling Complete  Palliative Care Screening Not Applicable  Medication Review Press photographer) Referral to Pharmacy

## 2021-11-04 NOTE — Discharge Summary (Signed)
Physician Discharge Summary   Patient: Anthony [name] MRN: [id number] DOB: 08/14/1941  Admit date:     10/30/2021  Discharge date: 11/04/21  Discharge Physician: Anthony [name]   PCP: Anthony [name], Anthony Halsted, MD   Recommendations at discharge:   Follow up with PCP within 1-2 weeks and repeat CBC/CMP/Mag, Phos within 1-2 weeks Follow up care by Nephrology within 1-2 weeks   Discharge Diagnoses: Principal Problem:   Chest pain Active Problems:   ESRD (end stage renal disease) on dialysis (HCC)   Anemia of chronic disease   Chronic diastolic heart failure (HCC)   PAF (paroxysmal atrial fibrillation) (HCC)   COPD (chronic obstructive pulmonary disease) (HCC)   Prolonged QT interval   Dementia without behavioral disturbance ([place])   TB lung, latent   Dyspnea   Hypokalemia   Leg pain   Hyperphosphatemia  Resolved Problems:   * No resolved hospital problems. [facility] Course: The patient is a 81 year old elderly African-American male with past medical history significant for but not to end-stage renal disease on hemodialysis, Monday Wednesday, Friday, history of latent TB, diabetes mellitus type 2, history of chronic diastolic CHF, history of COPD as well as dementia and other comorbidities who presented with chest pain after his dialysis session.  He was about 3 and half hours into his dialysis session on 10/30/21 when he felt an acute anterior chest like pressure and felt as if "somebody is sitting on his chest".  He also stated some dyspnea and shortness of breath as well as having some left lower extremity calf tenderness.  He reports compliance with his medications and was a current tobacco user and smokes a pack every 2 to 3 days.  In the ED he was initially documented hypotensive and hypoxic but his blood pressure was erroneously documented it was normal while he was in the ED.  Continued to have intermittent chest pain in the ED in the ED physician discussed with cardiology  who recommended observation.  Troponin was flat at 146 and trended down to 125.  EKG was showing some inverted T waves that were similar to priors.  His BUN and creatinine were a little elevated.  He was admitted for his chest pain and cardiology evaluated and an echocardiogram was done.  Cardiology felt that his chest pain was not indicative of ACS given that he had a cardiac catheterization in June that showed nonobstructive coronary disease and they recommended no pursuit of ischemic evaluation but recommended resuming aspirin 81 mg p.o. daily as well as statin.  PT OT evaluated recommending home health however family feels that they cannot take care of the patient so a SNF work-up was been initiated.  Patient was dialyzed today and had similar symptoms and had some dyspnea and abdominal tenderness during his dialysis session and it was felt that he had too much fluid pulled during his hemodialysis and they cut his treatment short due to his cramping and hypotension and they stopped his ultrafiltration with hemodialysis.  Subsequently nephrology gave the patient back 500 cc of fluid and he felt better.  Currently doing well but for some reason he was placed back on supplemental oxygen this morning but does not really dyspneic or hypoxic.  We will wean to room air.  Nephrology did dialysis today and they are recommending lowering the dry weight but not pull any fluid off with a next hemodialysis session.  Anticipating discharge going to SNF as he is medically stable today.  Assessment and  Plan: * Chest pain- (present on admission) -Had Chest Pain/Discomfort with Dyspnea after Dialysis two Dialysis sessions in a row -Troponin went from 146 -> 113 -> 125 and similar to previous labs and suspected chronically due to ESRD -Was Chest Pain free -EKG similar to prior with inverted inferior T waves -Had a Mycardio perfusion study on 6/21 which showed a small defect of mderate severity in the apical inferior  location -ECHO in 7/22 showed EF of 50-55% and Grade 2 DD with no significant valvular abnormalities -Cardiology consulted and he was evaluated for his recurrent chest pain and his symptoms and troponins were chronically elevated.  Patient had a cardiac catheterization back in June which showed nonobstructive coronary disease and a cardiology team recommended no further ischemic evaluation recommended continue home dose of aspirin 81 mg and increase the statin to 80 mg p.o. daily -Cardiology recommended checking lipids and liver panel in 8 weeks and recommended no further testing -Echocardiogram done and showed EF 50-55% and the Left Ventricl had no wall motion abnormalities and there is moderate concentric left ventricular hypertrophy and the ventricular diastolic parameters were consistent with grade 1 diastolic dysfunction.  Patient had severe MAC with protrusion with calcification on the annulus and into the left atrium and the cardiac echocardiogram showed no real significant change from prior study -Follow up with the Cardiology team in 4 to 6 weeks  Hyperphosphatemia -Patient's Phos Level went from 6.4 -> 5.3  -> 6.5 -> 5.5 -Continue to Monitor and Trend -Likely to further be corrected in Dialysis.  -Repeat Phos Level within 1 week  Leg pain -LE Duplex done and showed "There is no evidence of deep vein thrombosis in the lower extremity. However, portions of this examination were limited- see technologist comments above."   Hypokalemia -Patient is K+ is now 4.1 -Continue monitor and replete as necessary mag level is now 2.2 -Repeat CMP in a.m.  Dyspnea -Was dyspneic during his dialysis session as they feel that he may have had too much fluid pulled off of them -continue supplemental oxygen as needed and for some reason he was back on it and not complaining of SOB -SpO2: 93 % O2 Flow Rate (L/min): 2 L/min; Wean to Room Air and no longer wearing Supplemental O2 via [place] -Dyspnea resolved  but was wearing Supplemental O2 and his Antoine was not in his nose -CXR today showed "Stable exam. No acute cardiopulmonary findings."  TB lung, latent- (present on admission) -Resume home isoniazid 300 mg p.o. daily as well as home pyridoxine 50 mg p.o. daily  Dementia without behavioral disturbance ([place])- (present on admission) -Continue with Memantine 5 mg p.o. daily  Prolonged QT interval- (present on admission) -He had prolonged QTc of 549 admission -Replating and monitoring electrolytes; K+ is 4.1 now, and Mag is 2.2 -Avoiding QT prolongation medications  COPD (chronic obstructive pulmonary disease) ([place])- (present on admission) -Continue with Symbicort substitution with Breo Ellipta and also continue Spiriva Respimat and Albuterol Inhaler 2 puffs into lungs every 4 hours as needed -Continue to Monitor Respiratory Status carefully   PAF (paroxysmal atrial fibrillation) ([place])- (present on admission) -Continue telemetry monitoring and patient is rate controlled is not on anticoagulation due to recurrent GI bleeds -HR is ranging from 33-82  Chronic diastolic heart failure ([place])- (present on admission) -Repeat Echocardiogram showed diastolic dysfunction consistent with grade 1 -Appears currently compensated and he has dialysis for volume maintenance -Nephrology likely dialyzed too much and had too much ultrafiltration so they given back 500 mL  today -Strict I's and O's and daily weights; Patient is +1,016 mL since admission and Weight wass down 5 lbs since admission but he is now 6 lbs heavier than admit and ? Accuracy  Anemia of chronic disease- (present on admission) - Patient's hemoglobin/hematocrit went from 10.9/37.5 -> 11.3/37.3 -> 10.9/36.2 -> 11.4/37.4 -> 11.0/36.7 -> 9.9/32.7 -Check Anemia Panel and showed an iron level of 24, UIBC is 189, TIBC 213, saturation which is 11%, ferritin levels 100, folate was 5.7, vitamin B12 level was 313 -Continue to monitor for signs and symptoms  bleeding; currently no overt bleeding noted -Repeat CBC within 1 week   ESRD (end stage renal disease) on dialysis [facility]) -Nephrology consulted for further evaluation and maintenance of hemodialysis -After further discussion with nephrology they feel that they pulled too much fluid off of him this morning and had to give him 500 mL back.  He was 3 kg under his dry weight -Further care per nephrology and his usual hemodialysis treatment was cut short due to his cramping and hypotension as well as dyspnea; Next dialysis session scheduled for 11/04/21 as he awaits SNF placement -Nephrology recommends continue home binders and calcitriol  -Patient's BUN/creatinine went from 9/5.10 -> 22/8.79 -> 24/8.63 -> 40/11.02 -> 56/12.82 -Continue to monitor trend renal function carefully and repeat CMP within 1 week   Pain control - [facility] Controlled Substance Reporting System database was reviewed. and patient was instructed, not to drive, operate heavy machinery, perform activities at heights, swimming or participation in water activities or provide baby-sitting services while on Pain, Sleep and Anxiety Medications; until their outpatient Physician has advised to do so again. Also recommended to not to take more than prescribed Pain, Sleep and Anxiety Medications.   Consultants: Nephrology  Procedures performed: HD  Disposition: Skilled nursing facility Diet recommendation:  Renal diet  DISCHARGE MEDICATION: Allergies as of 11/04/2021   No Known Allergies      Medication List     STOP taking these medications    Advair HFA 45-21 MCG/ACT inhaler Generic drug: fluticasone-salmeterol       TAKE these medications    albuterol 108 (90 Base) MCG/ACT inhaler Commonly known as: Proventil HFA Inhale 2 puffs into the lungs every 4 (four) hours as needed for wheezing or shortness of breath.   aspirin EC 81 MG tablet Take 81 mg by mouth every morning. Swallow whole.   atorvastatin 20 MG  tablet Commonly known as: LIPITOR TAKE 1 TABLET BY MOUTH EVERYDAY AT BEDTIME   budesonide-formoterol 160-4.5 MCG/ACT inhaler Commonly known as: SYMBICORT Inhale 2 puffs into the lungs daily.   calcium acetate 667 MG capsule Commonly known as: PHOSLO Take 667 mg by mouth 3 (three) times daily after meals.   carboxymethylcellulose 0.5 % Soln Commonly known as: REFRESH PLUS Place 1 drop into both eyes daily as needed (dry eyes).   carvedilol 6.25 MG tablet Commonly known as: COREG Take 1 tablet (6.25 mg total) by mouth 2 (two) times daily with a meal.   isoniazid 300 MG tablet Commonly known as: NYDRAZID TAKE 1 TABLET BY MOUTH EVERY DAY What changed: when to take this   lidocaine-prilocaine cream Commonly known as: EMLA Apply 1 application topically daily as needed (port access).   memantine 5 MG tablet Commonly known as: NAMENDA Take 5 mg by mouth daily.   pantoprazole 40 MG tablet Commonly known as: PROTONIX Take 1 tablet (40 mg total) by mouth daily.   pyridOXINE 50 MG tablet Commonly known as: VITAMIN  B-6 Take 1 tablet (50 mg total) by mouth daily.   Spiriva Respimat 2.5 MCG/ACT Aers Generic drug: Tiotropium Bromide Monohydrate Inhale 2 puffs into the lungs daily.   traZODone 50 MG tablet Commonly known as: DESYREL Take 1-2 tablets (50-100 mg total) by mouth at bedtime as needed. What changed: when to take this       Discharge Exam: Filed Weights   11/04/21 0100 11/04/21 0735 11/04/21 1101  Weight: 65.3 kg 66.7 kg 67.1 kg   Today's Vitals   11/04/21 1000 11/04/21 1030 11/04/21 1101 11/04/21 1230  BP: (!) 180/87 (!) 133/92 (!) 173/91   Pulse: 64 (!) 52 77   Resp: 16 (!) 21 15   Temp:   97.8 F (36.6 C)   TempSrc:      SpO2: 100%  100% 100%  Weight:   67.1 kg   Height:      PainSc:   0-No pain    Body mass index is 23.17 kg/m.  Examination: Physical Exam:  Constitutional: Elderly thin AAM in NAD Respiratory: Diminished to auscultation  bilaterally with coarse breath sounds, no wheezing, rales, rhonchi or crackles. Normal respiratory effort and patient is not tachypenic. No accessory muscle use. Not wearing supplemental O2 via [place] Cardiovascular: RRR, no murmurs / rubs / gallops. S1 and S2 auscultated. No extremity edema.  Abdomen: Soft, non-tender, non-distended. Bowel sounds positive.  GU: Deferred.  Condition at discharge: stable  The results of significant diagnostics from this hospitalization (including imaging, microbiology, ancillary and laboratory) are listed below for reference.   Imaging Studies: DG Chest 2 View  Result Date: 10/30/2021 CLINICAL DATA:  Shortness of breath EXAM: CHEST - 2 VIEW COMPARISON:  Previous studies including the examination of 09/09/2021 FINDINGS: Cardiac size is within normal limits. There are no signs of pulmonary edema. There are linear densities in the left lower lung fields. There is blunting of left lateral CP angle. There is no pneumothorax. Tip of right IJ dialysis catheter is seen in the right atrium. There is vascular coil in the left axilla. IMPRESSION: There are linear densities in the left lower lung fields suggesting subsegmental atelectasis. Possible small left pleural effusion. There are no signs of alveolar pulmonary edema. Tip of dialysis catheter is seen in the right atrium. Electronically Signed   By: Elmer Picker M.D.   On: 10/30/2021 18:05   DG Clavicle Left  Result Date: 10/12/2021 CLINICAL DATA:  Fall, pain EXAM: LEFT CLAVICLE - 2+ VIEWS COMPARISON:  None. FINDINGS: No acute fracture or dislocation identified. Small degenerative osteophytes at the acromioclavicular joint. Vascular stent graft in the left axillary region noted. IMPRESSION: No acute fracture identified. Electronically Signed   By: Ofilia Neas M.D.   On: 10/12/2021 15:24   DG Clavicle Right  Result Date: 10/12/2021 CLINICAL DATA:  Fall, pain EXAM: RIGHT CLAVICLE - 2+ VIEWS COMPARISON:  None.  FINDINGS: No acute fracture or dislocation identified. Small degenerative osteophytes at the acromioclavicular joint. IMPRESSION: No acute fracture identified. Electronically Signed   By: Ofilia Neas M.D.   On: 10/12/2021 15:23   CT Head Wo Contrast  Result Date: 10/12/2021 CLINICAL DATA:  Fall down steps, head trauma, left-sided facial abrasion, loss of consciousness EXAM: CT HEAD WITHOUT CONTRAST CT MAXILLOFACIAL WITHOUT CONTRAST CT CERVICAL SPINE WITHOUT CONTRAST TECHNIQUE: Multidetector CT imaging of the head, cervical spine, and maxillofacial structures were performed using the standard protocol without intravenous contrast. Multiplanar CT image reconstructions of the cervical spine and maxillofacial structures were also generated. RADIATION  DOSE REDUCTION: This exam was performed according to the departmental dose-optimization program which includes automated exposure control, adjustment of the mA and/or kV according to patient size and/or use of iterative reconstruction technique. COMPARISON:  09/30/2021 FINDINGS: CT HEAD FINDINGS Brain: No evidence of acute infarction, hemorrhage, hydrocephalus, extra-axial collection or mass lesion/mass effect. Nonacute lacunar infarction of the right caudate. Periventricular and deep white matter hypodensity. Unchanged prominence of the bilateral frontal subdural spaces. Vascular: No hyperdense vessel or unexpected calcification. CT FACIAL BONES FINDINGS Skull: Normal. Negative for fracture or focal lesion. Facial bones: No displaced facial bone fractures or dislocations. Sinuses/Orbits: Depressed fracture of the left orbital floor, largest fracture fragment measuring at least 1.0 x 1.2 cm, with 0.6 cm depression. There is fat herniation but no rectus herniation. The left orbital contents are intact by CT. High attenuation opacification of the left maxillary sinus. Other: Soft tissue contusion about the left orbit and forehead. CT CERVICAL SPINE FINDINGS  Alignment: Normal. Skull base and vertebrae: No acute fracture. No primary bone lesion or focal pathologic process. Soft tissues and spinal canal: No prevertebral fluid or swelling. No visible canal hematoma. Disc levels: Moderate to severe multilevel disc space height loss and osteophytosis from C3 through C7. Upper chest: Negative. Other: None. IMPRESSION: 1. No acute intracranial pathology. 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 2. Depressed fracture of the left orbital floor, largest fracture fragment measuring at least 1.0 x 1.2 cm, with 0.6 cm depression. There is fat herniation but no rectus herniation. The left orbital contents are intact by CT. 3. Blood product within the left maxillary sinus. 4. No additional displaced fracture of the facial bones. 5. Soft tissue contusion about the left orbit and forehead. 6. No fracture or static subluxation of the cervical spine. Moderate to severe multilevel cervical disc degenerative disease. Electronically Signed   By: Delanna Ahmadi M.D.   On: 10/12/2021 15:21   CT Cervical Spine Wo Contrast  Result Date: 10/12/2021 CLINICAL DATA:  Fall down steps, head trauma, left-sided facial abrasion, loss of consciousness EXAM: CT HEAD WITHOUT CONTRAST CT MAXILLOFACIAL WITHOUT CONTRAST CT CERVICAL SPINE WITHOUT CONTRAST TECHNIQUE: Multidetector CT imaging of the head, cervical spine, and maxillofacial structures were performed using the standard protocol without intravenous contrast. Multiplanar CT image reconstructions of the cervical spine and maxillofacial structures were also generated. RADIATION DOSE REDUCTION: This exam was performed according to the departmental dose-optimization program which includes automated exposure control, adjustment of the mA and/or kV according to patient size and/or use of iterative reconstruction technique. COMPARISON:  09/30/2021 FINDINGS: CT HEAD FINDINGS Brain: No evidence of acute  infarction, hemorrhage, hydrocephalus, extra-axial collection or mass lesion/mass effect. Nonacute lacunar infarction of the right caudate. Periventricular and deep white matter hypodensity. Unchanged prominence of the bilateral frontal subdural spaces. Vascular: No hyperdense vessel or unexpected calcification. CT FACIAL BONES FINDINGS Skull: Normal. Negative for fracture or focal lesion. Facial bones: No displaced facial bone fractures or dislocations. Sinuses/Orbits: Depressed fracture of the left orbital floor, largest fracture fragment measuring at least 1.0 x 1.2 cm, with 0.6 cm depression. There is fat herniation but no rectus herniation. The left orbital contents are intact by CT. High attenuation opacification of the left maxillary sinus. Other: Soft tissue contusion about the left orbit and forehead. CT CERVICAL SPINE FINDINGS Alignment: Normal. Skull base and vertebrae: No acute fracture. No primary bone lesion or focal pathologic process. Soft tissues and spinal canal: No prevertebral fluid or swelling. No visible canal  hematoma. Disc levels: Moderate to severe multilevel disc space height loss and osteophytosis from C3 through C7. Upper chest: Negative. Other: None. IMPRESSION: 1. No acute intracranial pathology. 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 2. Depressed fracture of the left orbital floor, largest fracture fragment measuring at least 1.0 x 1.2 cm, with 0.6 cm depression. There is fat herniation but no rectus herniation. The left orbital contents are intact by CT. 3. Blood product within the left maxillary sinus. 4. No additional displaced fracture of the facial bones. 5. Soft tissue contusion about the left orbit and forehead. 6. No fracture or static subluxation of the cervical spine. Moderate to severe multilevel cervical disc degenerative disease. Electronically Signed   By: Delanna Ahmadi M.D.   On: 10/12/2021 15:21   DG CHEST PORT 1  VIEW  Result Date: 11/04/2021 CLINICAL DATA:  Shortness of breath. EXAM: PORTABLE CHEST 1 VIEW COMPARISON:  10/30/2021 FINDINGS: 0517 hours. The cardio pericardial silhouette is enlarged. Similar streaky opacities the bases compatible with atelectasis or scarring. No edema or focal airspace consolidation. Right dialysis catheter remains in place per Telemetry leads overlie the chest. IMPRESSION: Stable exam. No acute cardiopulmonary findings. Electronically Signed   By: Misty Stanley M.D.   On: 11/04/2021 07:58   ECHOCARDIOGRAM COMPLETE  Result Date: 10/31/2021    ECHOCARDIOGRAM REPORT   Patient Name:   AUBURN HERT Date of Exam: 10/31/2021 Medical Rec #:  [id number]      Height:       67.0 in Accession #:    [id number]     Weight:       154.9 lb Date of Birth:  31-Oct-1940       BSA:          1.814 m Patient Age:    62 years       BP:           131/70 mmHg Patient Gender: M              HR:           60 bpm. Exam Location:  Inpatient Procedure: 2D Echo Indications:    Chest pain  History:        Patient has prior history of Echocardiogram examinations, most                 recent 04/17/2021. CAD, COPD; Risk Factors:Hypertension.  Sonographer:    Jefferey Pica Referring Phys: [id number] Envy Meno LATIF [place]  1. Left ventricular ejection fraction, by estimation, is 50 to 55%. The left ventricle has low normal function. The left ventricle has no regional wall motion abnormalities. There is moderate concentric left ventricular hypertrophy. Left ventricular diastolic parameters are consistent with Grade I diastolic dysfunction (impaired relaxation). Elevated left ventricular end-diastolic pressure.  2. Right ventricular systolic function is normal. The right ventricular size is normal. Tricuspid regurgitation signal is inadequate for assessing PA pressure.  3. Left atrial size was moderately dilated.  4. Severe MAC with protrusion of calcification beyond annulus and into left atrium. The mitral valve is  grossly normal. Trivial mitral valve regurgitation. Severe mitral annular calcification.  5. The aortic valve is grossly normal. There is moderate calcification of the aortic valve. Aortic valve regurgitation is not visualized. Aortic valve sclerosis/calcification is present, without any evidence of aortic stenosis.  6. The inferior vena cava is normal in size with greater than 50% respiratory variability, suggesting right atrial pressure of 3 mmHg. Comparison(s): No significant  change from prior study. FINDINGS  Left Ventricle: Left ventricular ejection fraction, by estimation, is 50 to 55%. The left ventricle has low normal function. The left ventricle has no regional wall motion abnormalities. The left ventricular internal cavity size was normal in size. There is moderate concentric left ventricular hypertrophy. Left ventricular diastolic parameters are consistent with Grade I diastolic dysfunction (impaired relaxation). Elevated left ventricular end-diastolic pressure. Right Ventricle: The right ventricular size is normal. No increase in right ventricular wall thickness. Right ventricular systolic function is normal. Tricuspid regurgitation signal is inadequate for assessing PA pressure. Left Atrium: Left atrial size was moderately dilated. Right Atrium: Right atrial size was normal in size. Pericardium: There is no evidence of pericardial effusion. Mitral Valve: Severe MAC with protrusion of calcification beyond annulus and into left atrium. The mitral valve is grossly normal. Severe mitral annular calcification. Trivial mitral valve regurgitation. Tricuspid Valve: The tricuspid valve is normal in structure. Tricuspid valve regurgitation is trivial. No evidence of tricuspid stenosis. Aortic Valve: The aortic valve is grossly normal. There is moderate calcification of the aortic valve. Aortic valve regurgitation is not visualized. Aortic valve sclerosis/calcification is present, without any evidence of aortic  stenosis. Aortic valve peak gradient measures 8.8 mmHg. Pulmonic Valve: The pulmonic valve was not well visualized. Pulmonic valve regurgitation is not visualized. Aorta: The aortic root and ascending aorta are structurally normal, with no evidence of dilitation. Venous: The inferior vena cava is normal in size with greater than 50% respiratory variability, suggesting right atrial pressure of 3 mmHg. IAS/Shunts: The atrial septum is grossly normal.  LEFT VENTRICLE PLAX 2D LVIDd:         4.50 cm     Diastology LVIDs:         3.80 cm     LV e' medial:    4.06 cm/s LV PW:         1.60 cm     LV E/e' medial:  14.2 LV IVS:        2.10 cm     LV e' lateral:   2.62 cm/s LVOT diam:     2.00 cm     LV E/e' lateral: 21.9 LV SV:         69 LV SV Index:   38 LVOT Area:     3.14 cm  LV Volumes (MOD) LV vol d, MOD A4C: 92.2 ml LV vol s, MOD A4C: 59.0 ml LV SV MOD A4C:     92.2 ml RIGHT VENTRICLE            IVC RV Basal diam:  2.60 cm    IVC diam: 1.70 cm RV S prime:     9.56 cm/s TAPSE (M-mode): 1.9 cm LEFT ATRIUM             Index        RIGHT ATRIUM           Index LA diam:        3.90 cm 2.15 cm/m   RA Area:     14.00 cm LA Vol (A2C):   69.6 ml 38.37 ml/m  RA Volume:   33.70 ml  18.58 ml/m LA Vol (A4C):   55.2 ml 30.43 ml/m LA Biplane Vol: 61.3 ml 33.79 ml/m  AORTIC VALVE                 PULMONIC VALVE AV Area (Vmax): 2.48 cm     PV Vmax:       0.75 m/s  AV Vmax:        148.50 cm/s  PV Peak grad:  2.3 mmHg AV Peak Grad:   8.8 mmHg LVOT Vmax:      117.00 cm/s LVOT Vmean:     81.000 cm/s LVOT VTI:       0.221 m  AORTA Ao Root diam: 3.20 cm MITRAL VALVE MV Area (PHT): 2.54 cm     SHUNTS MV Decel Time: 299 msec     Systemic VTI:  0.22 m MV E velocity: 57.50 cm/s   Systemic Diam: 2.00 cm MV A velocity: 110.00 cm/s MV E/A ratio:  0.52 Buford Dresser MD Electronically signed by Buford Dresser MD Signature Date/Time: 10/31/2021/6:18:12 PM    Final    VAS [place] LOWER EXTREMITY VENOUS (DVT)  Result Date: 10/31/2021   Lower Venous DVT Study Patient Name:  ZENO HICKEL  Date of Exam:   10/31/2021 Medical Rec #: [id number]       Accession #:    [id number] Date of Birth: [birth year]        Patient Gender: M Patient Age:   58 years Exam Location:  [facility] Procedure:      VAS [place] LOWER EXTREMITY VENOUS (DVT) Referring Phys: CHING TU --------------------------------------------------------------------------------  Indications: Pain.  Limitations: Veins shadowed out by calcifications, scarring in left calf. Comparison Study: No prior study on file Performing Technologist: Sharion Dove RVS  Examination Guidelines: A complete evaluation includes B-mode imaging, spectral Doppler, color Doppler, and power Doppler as needed of all accessible portions of each vessel. Bilateral testing is considered an integral part of a complete examination. Limited examinations for reoccurring indications may be performed as noted. The reflux portion of the exam is performed with the patient in reverse Trendelenburg.  +-----+---------------+---------+-----------+----------+--------------+  RIGHT Compressibility Phasicity Spontaneity Properties Thrombus Aging  +-----+---------------+---------+-----------+----------+--------------+  CFV   Full            Yes       Yes                                    +-----+---------------+---------+-----------+----------+--------------+   +---------+---------------+---------+-----------+----------+-------------------+  LEFT      Compressibility Phasicity Spontaneity Properties Thrombus Aging       +---------+---------------+---------+-----------+----------+-------------------+  CFV       Full            Yes       Yes                                         +---------+---------------+---------+-----------+----------+-------------------+  SFJ       Full                                                                  +---------+---------------+---------+-----------+----------+-------------------+  FV Prox   Full             Yes       Yes                                         +---------+---------------+---------+-----------+----------+-------------------+  FV Mid    Full                                                                  +---------+---------------+---------+-----------+----------+-------------------+  FV Distal Full            Yes       Yes                                         +---------+---------------+---------+-----------+----------+-------------------+  PFV       Full            Yes       Yes                                         +---------+---------------+---------+-----------+----------+-------------------+  POP       Full                                                                  +---------+---------------+---------+-----------+----------+-------------------+  PTV                                                        Not well visualized  +---------+---------------+---------+-----------+----------+-------------------+  PERO                                                       Not well visualized  +---------+---------------+---------+-----------+----------+-------------------+    Summary: RIGHT: - No evidence of common femoral vein obstruction.  LEFT: - There is no evidence of deep vein thrombosis in the lower extremity. However, portions of this examination were limited- see technologist comments above.  - No cystic structure found in the popliteal fossa.  *See table(s) above for measurements and observations. Electronically signed by Monica Martinez MD on 10/31/2021 at 6:35:01 PM.    Final    CT Maxillofacial Wo Contrast  Result Date: 10/12/2021 CLINICAL DATA:  Fall down steps, head trauma, left-sided facial abrasion, loss of consciousness EXAM: CT HEAD WITHOUT CONTRAST CT MAXILLOFACIAL WITHOUT CONTRAST CT CERVICAL SPINE WITHOUT CONTRAST TECHNIQUE: Multidetector CT imaging of the head, cervical spine, and maxillofacial structures were performed using the standard protocol without intravenous  contrast. Multiplanar CT image reconstructions of the cervical spine and maxillofacial structures were also generated. RADIATION DOSE REDUCTION: This exam was performed according to the departmental dose-optimization program which includes automated exposure control, adjustment of the mA and/or kV according to patient size and/or use of iterative reconstruction technique. COMPARISON:  09/30/2021 FINDINGS: CT HEAD FINDINGS Brain: No evidence of acute infarction, hemorrhage, hydrocephalus, extra-axial  collection or mass lesion/mass effect. Nonacute lacunar infarction of the right caudate. Periventricular and deep white matter hypodensity. Unchanged prominence of the bilateral frontal subdural spaces. Vascular: No hyperdense vessel or unexpected calcification. CT FACIAL BONES FINDINGS Skull: Normal. Negative for fracture or focal lesion. Facial bones: No displaced facial bone fractures or dislocations. Sinuses/Orbits: Depressed fracture of the left orbital floor, largest fracture fragment measuring at least 1.0 x 1.2 cm, with 0.6 cm depression. There is fat herniation but no rectus herniation. The left orbital contents are intact by CT. High attenuation opacification of the left maxillary sinus. Other: Soft tissue contusion about the left orbit and forehead. CT CERVICAL SPINE FINDINGS Alignment: Normal. Skull base and vertebrae: No acute fracture. No primary bone lesion or focal pathologic process. Soft tissues and spinal canal: No prevertebral fluid or swelling. No visible canal hematoma. Disc levels: Moderate to severe multilevel disc space height loss and osteophytosis from C3 through C7. Upper chest: Negative. Other: None. IMPRESSION: 1. No acute intracranial pathology. 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 2. Depressed fracture of the left orbital floor, largest fracture fragment measuring at least 1.0 x 1.2 cm, with 0.6 cm depression. There is fat herniation but  no rectus herniation. The left orbital contents are intact by CT. 3. Blood product within the left maxillary sinus. 4. No additional displaced fracture of the facial bones. 5. Soft tissue contusion about the left orbit and forehead. 6. No fracture or static subluxation of the cervical spine. Moderate to severe multilevel cervical disc degenerative disease. Electronically Signed   By: Delanna Ahmadi M.D.   On: 10/12/2021 15:21    Microbiology: Results for orders placed or performed during the hospital encounter of 10/30/21  Resp Panel by RT-PCR (Flu A&B, Covid) Nasopharyngeal Swab     Status: None   Collection Time: 10/30/21  8:16 PM   Specimen: Nasopharyngeal Swab; Nasopharyngeal(NP) swabs in vial transport medium  Result Value Ref Range Status   SARS Coronavirus 2 by RT PCR NEGATIVE NEGATIVE Final    Comment: (NOTE) SARS-CoV-2 target nucleic acids are NOT DETECTED.  The SARS-CoV-2 RNA is generally detectable in upper respiratory specimens during the acute phase of infection. The lowest concentration of SARS-CoV-2 viral copies this assay can detect is 138 copies/mL. A negative result does not preclude SARS-Cov-2 infection and should not be used as the sole basis for treatment or other patient management decisions. A negative result may occur with  improper specimen collection/handling, submission of specimen other than nasopharyngeal swab, presence of viral mutation(s) within the areas targeted by this assay, and inadequate number of viral copies(<138 copies/mL). A negative result must be combined with clinical observations, patient history, and epidemiological information. The expected result is Negative.  Fact Sheet for Patients:  EntrepreneurPulse.com.au  Fact Sheet for Healthcare Providers:  IncredibleEmployment.be  This test is no t yet approved or cleared by the [place] FDA and  has been authorized for detection and/or diagnosis of  SARS-CoV-2 by FDA under an Emergency Use Authorization (EUA). This EUA will remain  in effect (meaning this test can be used) for the duration of the COVID-19 declaration under Section 564(b)(1) of the Act, 21 U.S.C.section 360bbb-3(b)(1), unless the authorization is terminated  or revoked sooner.       Influenza A by PCR NEGATIVE NEGATIVE Final   Influenza B by PCR NEGATIVE NEGATIVE Final    Comment: (NOTE) The Xpert Xpress SARS-CoV-2/FLU/RSV plus assay is intended as an aid in the diagnosis  of influenza from Nasopharyngeal swab specimens and should not be used as a sole basis for treatment. Nasal washings and aspirates are unacceptable for Xpert Xpress SARS-CoV-2/FLU/RSV testing.  Fact Sheet for Patients: EntrepreneurPulse.com.au  Fact Sheet for Healthcare Providers: IncredibleEmployment.be  This test is not yet approved or cleared by the [place] FDA and has been authorized for detection and/or diagnosis of SARS-CoV-2 by FDA under an Emergency Use Authorization (EUA). This EUA will remain in effect (meaning this test can be used) for the duration of the COVID-19 declaration under Section 564(b)(1) of the Act, 21 U.S.C. section 360bbb-3(b)(1), unless the authorization is terminated or revoked.  Performed at [facility], [place] [address] [place] [address]    Labs: CBC: Recent Labs  Lab 10/30/21 1638 10/30/21 1704 10/31/21 1823 11/01/21 8938 11/01/21 0801 11/02/21 0332 11/03/21 0402 11/04/21 0444 11/04/21 0809  WBC 6.3  --  4.9   < > 5.1 6.2 5.8 6.5 6.8  NEUTROABS 4.2  --  3.0  --   --  3.8 3.8 4.3  --   HGB 11.2*   < > 10.9*   < > 10.9* 11.4* 11.0* 10.2* 9.9*  HCT 38.0*   < > 37.5*   < > 36.2* 37.4* 36.7* 32.5* 32.7*  MCV 91.3  --  92.1   < > 91.9 89.5 89.7 88.8 88.9  PLT 246  --  292   < > 287 249 263 238 267   < > = values in this interval not displayed.   Basic Metabolic Panel: Recent Labs  Lab  10/30/21 1638 10/30/21 1704 10/31/21 0107 10/31/21 1823 11/02/21 0332 11/03/21 0402 11/04/21 0444  NA 136 135  --  140 136 140 139  K 3.1* 3.1*  --  3.8 3.9 4.0 4.1  CL 94* 95*  --  98 97* 100 99  CO2 28  --   --  _0 GLUCOSE 83 89  --  109* 100* 123* 114*  BUN 8 9  --  22 24* 40* 56*  CREATININE 4.83* 5.10*  --  8.79* 8.63* 11.02* 12.82*  CALCIUM 8.0*  --   --  8.6* 8.8* 8.7* 8.5*  MG  --   --  2.0 2.2 2.2 2.3 2.2  PHOS  --   --   --  6.4* 5.3* 6.5* 5.5*   Liver Function Tests: Recent Labs  Lab 10/30/21 1638 10/31/21 1823 11/02/21 0332 11/03/21 0402 11/04/21 0444  AST 16 8* 12* 9* 8*  ALT 6 <5 <5 <5 <5  ALKPHOS 85 87 89 80 72  BILITOT 0.6 0.3 0.5 0.2* 0.6  PROT 7.8 6.9 7.4 6.9 6.6  ALBUMIN 3.2* 2.9* 3.0* 2.9* 2.8*   CBG: Recent Labs  Lab 10/30/21 1634 11/01/21 0939  GLUCAP 98 87    Discharge time spent: greater than 30 minutes.  Signed: Raiford Noble, DO [facility] 11/04/2021

## 2021-11-04 NOTE — Progress Notes (Signed)
OT Cancellation Note  Patient Details Name: Anthony [name] MRN: [id number] DOB: 1941-03-11   Cancelled Treatment:    Reason Eval/Treat Not Completed: Patient at procedure or test/ unavailable Patient at dialysis this AM. Will follow back when patient has returned.   Corinne Ports E. Mychael Smock, OTR/L Acute Rehabilitation Services [phone] [place] 11/04/2021, 8:38 AM

## 2021-11-04 NOTE — Progress Notes (Signed)
Occupational Therapy Treatment Patient Details Name: Anthony [name] MRN: [id number] DOB: March 30, 1941 Today's Date: 11/04/2021   History of present illness The pt is a 81 yo male presenting 2/8 with SOB and chest pain during HD. Work up with no acute fidings, cardiology consulted and signed off. PMH includes: alcohol use, anemia, CHF, CKD on HD MWF, mild dementia, HTN, CVA, and latent TB.   OT comments  Patient continues to make progress towards goals in skilled OT session. Patient's session encompassed therapeutic exercises as patient had recently come back from dialysis. Patient with increased wheezing and work of breathing at rest, but therapist able to get moderately reliable pleth of 98-100% on 3L of oxygen. Patient more confused in session, reporting to therapist that his brother was on the way to pick him up and take him home. Patient oriented x4 otherwise. Patient with increased DOE when completing bed level exercises with increased breaks needed to recover despite VSS. OT continues to recommend home health, however if family support is inadequate ALF or memory care would be appropriate.    Recommendations for follow up therapy are one component of a multi-disciplinary discharge planning process, led by the attending physician.  Recommendations may be updated based on patient status, additional functional criteria and insurance authorization.    Follow Up Recommendations  Home health OT (Family requesting SNF)    Assistance Recommended at Discharge Intermittent Supervision/Assistance  Patient can return home with the following  A little help with walking and/or transfers;A little help with bathing/dressing/bathroom;Assistance with cooking/housework;Direct supervision/assist for medications management;Direct supervision/assist for financial management;Assist for transportation;Help with stairs or ramp for entrance   Equipment Recommendations       Recommendations for Other Services       Precautions / Restrictions Precautions Precautions: Fall Precaution Comments: watch O2 sats Restrictions Weight Bearing Restrictions: No       Mobility Bed Mobility                    Transfers                         Balance                                           ADL either performed or assessed with clinical judgement   ADL                                         General ADL Comments: Session focus on bed level exercises    Extremity/Trunk Assessment              Vision       Perception     Praxis      Cognition Arousal/Alertness: Awake/alert Behavior During Therapy: WFL for tasks assessed/performed Overall Cognitive Status: History of cognitive impairments - at baseline                                 General Comments: Patient oriented, but admament his brother was coming to take him home this date, followed up with RN, family is still requesting SNF st this time        Exercises General Exercises - Lower Extremity Ankle  Circles/Pumps: AROM, Both, 15 reps Quad Sets: AROM, Both, 10 reps Straight Leg Raises: AROM, Both, 10 reps    Shoulder Instructions       General Comments      Pertinent Vitals/ Pain       Pain Assessment Pain Assessment: No/denies pain  Home Living                                          Prior Functioning/Environment              Frequency  Min 2X/week        Progress Toward Goals  OT Goals(current goals can now be found in the care plan section)  Progress towards OT goals: Progressing toward goals  Acute Rehab OT Goals Patient Stated Goal: to go home with my brother OT Goal Formulation: Patient unable to participate in goal setting Time For Goal Achievement: 11/15/21 Potential to Achieve Goals: Good  Plan Discharge plan remains appropriate    Co-evaluation                 AM-PAC OT "6 Clicks" Daily  Activity     Outcome Measure   Help from another person eating meals?: None Help from another person taking care of personal grooming?: A Little Help from another person toileting, which includes using toliet, bedpan, or urinal?: A Little Help from another person bathing (including washing, rinsing, drying)?: A Little Help from another person to put on and taking off regular upper body clothing?: A Little Help from another person to put on and taking off regular lower body clothing?: A Little 6 Click Score: 19    End of Session    OT Visit Diagnosis: Unsteadiness on feet (R26.81);Repeated falls (R29.6);Muscle weakness (generalized) (M62.81);History of falling (Z91.81);Other abnormalities of gait and mobility (R26.89)   Activity Tolerance Patient limited by fatigue;Other (comment) (Increased wheezing and DOE with bed level exercises despite good saturations)   Patient Left in bed;with call bell/phone within reach   Nurse Communication Mobility status;Other (comment) (disoriented and believes he's going home with his brother)        Time: [phone] OT Time Calculation (min): 12 min  Charges: OT General Charges $OT Visit: 1 Visit OT Treatments $Therapeutic Exercise: 8-22 mins  Corinne Ports E. Orva Riles, OTR/L Acute Rehabilitation Services [phone] [place] 11/04/2021, 12:40 PM

## 2021-11-04 NOTE — Progress Notes (Signed)
°  [facility] Progress Note   Subjective: Seen and examined on HD. No complaints, denies chest pain. Tolerating HD.  Objective Vitals:   11/04/21 0740 11/04/21 0800 11/04/21 0830 11/04/21 0900  BP: (!) 160/91 132/73 (!) 185/88 (!) 145/66  Pulse: 78 80 73 84  Resp: 20 20 (!) 21 18  Temp:      TempSrc:      SpO2: 100% 100% 96% 98%  Weight:      Height:         Additional Objective Labs: Basic Metabolic Panel: Recent Labs  Lab 11/02/21 0332 11/03/21 0402 11/04/21 0444  NA 136 140 139  K 3.9 4.0 4.1  CL 97* 100 99  CO2 26 24 24   GLUCOSE 100* 123* 114*  BUN 24* 40* 56*  CREATININE 8.63* 11.02* 12.82*  CALCIUM 8.8* 8.7* 8.5*  PHOS 5.3* 6.5* 5.5*   CBC: Recent Labs  Lab 11/01/21 0801 11/02/21 0332 11/03/21 0402 11/04/21 0444 11/04/21 0809  WBC 5.1 6.2 5.8 6.5 6.8  NEUTROABS  --  3.8 3.8 4.3  --   HGB 10.9* 11.4* 11.0* 10.2* 9.9*  HCT 36.2* 37.4* 36.7* 32.5* 32.7*  MCV 91.9 89.5 89.7 88.8 88.9  PLT 287 249 263 238 267   Blood Culture No results found for: SDES, SPECREQUEST, CULT, REPTSTATUS   Physical Exam General: Elderly man, nad  Heart: RRR No m,r,g  Lungs: Clear bilaterally  Abdomen: soft non-tender  Extremities: No LE edema  Dialysis Access: R IJ TDC   Medications:   aspirin EC  81 mg Oral Daily   atorvastatin  80 mg Oral Daily   Chlorhexidine Gluconate Cloth  6 each Topical Q0600   fluticasone furoate-vilanterol  1 puff Inhalation Daily   heparin  5,000 Units Subcutaneous Q8H   heparin sodium (porcine)       heparin sodium (porcine)  1,500 Units Intravenous Once   isoniazid  300 mg Oral Daily   memantine  5 mg Oral Daily   pantoprazole  40 mg Oral Daily   pyridOXINE  50 mg Oral Daily   umeclidinium bromide  1 puff Inhalation Daily    Dialysis Orders: [facility] MWF   4h  67kg  400/800  2/2 bath  TDC  Hep 3000  ESA: Mircera 225 q 2 wks. Last 1/30 VDRA: Calcitriol 2.25 TIW    Assessment/Plan: Chest pain/dyspnea (reason for  admission) -- Hx nonobs CAD, CHF. Seen by cardiology, not planning further evaluation.  Resolved. No c/os this am.  ESRD -  HD MWF. Next HD 2/15 if still here  Hypotension/volume - below dry wt, felt awful on Sat hd due to over-ultrafiltration. Keeping net even today, will estb new edw after today Anemia  - Stable. Hgb 9.9 today, iron panel 2/13-iron deficient, will start Fe load 8/92 Metabolic bone disease -  Continue home binders/calcitriol if stays in hospital DM -per primary  Debility PT/OT per primary  Dispo - awaiting SNF placement  Gean Quint, MD [place] 11/04/2021, 9:28 AM

## 2021-11-05 ENCOUNTER — Ambulatory Visit: Payer: Medicare Other | Admitting: Infectious Disease

## 2021-11-05 DIAGNOSIS — J42 Unspecified chronic bronchitis: Secondary | ICD-10-CM

## 2021-11-05 DIAGNOSIS — R0609 Other forms of dyspnea: Secondary | ICD-10-CM

## 2021-11-05 DIAGNOSIS — F17200 Nicotine dependence, unspecified, uncomplicated: Secondary | ICD-10-CM

## 2021-11-05 DIAGNOSIS — F0390 Unspecified dementia without behavioral disturbance: Secondary | ICD-10-CM

## 2021-11-05 DIAGNOSIS — I10 Essential (primary) hypertension: Secondary | ICD-10-CM

## 2021-11-05 DIAGNOSIS — Z7185 Encounter for immunization safety counseling: Secondary | ICD-10-CM

## 2021-11-05 DIAGNOSIS — Z227 Latent tuberculosis: Secondary | ICD-10-CM

## 2021-11-05 MED ORDER — NICOTINE 14 MG/24HR TD PT24: 14.0000 mg | MEDICATED_PATCH | Freq: Every day | TRANSDERMAL | 0 refills | Status: AC

## 2021-11-05 NOTE — Care Management Important Message (Signed)
Important Message  Patient Details  Name: Anthony [name] MRN: [id number] Date of Birth: [birth year]   Medicare Important Message Given:  Yes     Shelda Altes 11/05/2021, 8:01 AM

## 2021-11-05 NOTE — Discharge Summary (Signed)
Physician Discharge Summary   Patient: Anthony [name] MRN: [id number] DOB: 06-05-1941  Admit date:     10/30/2021  Discharge date: 11/05/21  Discharge Physician: Kerney Elbe   PCP: Isaac Bliss, Rayford Halsted, MD   Recommendations at discharge:   Follow up with PCP within 1-2 weeks and repeat CBC/CMP/Mag, Phos within 1-2 weeks Follow up with Cardiology within 4-6 weeks  Follow up care by Nephrology within 1-2 weeks   Discharge Diagnoses: Principal Problem:   Chest pain Active Problems:   ESRD (end stage renal disease) on dialysis (HCC)   Anemia of chronic disease   Chronic diastolic heart failure (HCC)   PAF (paroxysmal atrial fibrillation) (HCC)   COPD (chronic obstructive pulmonary disease) (HCC)   Prolonged QT interval   Dementia without behavioral disturbance ([place])   TB lung, latent   Dyspnea   Hypokalemia   Leg pain   Hyperphosphatemia  Resolved Problems:   * No resolved hospital problems. [facility] Course: The patient is a 81 year old elderly African-American male with past medical history significant for but not to end-stage renal disease on hemodialysis, Monday Wednesday, Friday, history of latent TB, diabetes mellitus type 2, history of chronic diastolic CHF, history of COPD as well as dementia and other comorbidities who presented with chest pain after his dialysis session.  He was about 3 and half hours into his dialysis session on 10/30/21 when he felt an acute anterior chest like pressure and felt as if "somebody is sitting on his chest".  He also stated some dyspnea and shortness of breath as well as having some left lower extremity calf tenderness.  He reports compliance with his medications and was a current tobacco user and smokes a pack every 2 to 3 days.  In the ED he was initially documented hypotensive and hypoxic but his blood pressure was erroneously documented it was normal while he was in the ED.  Continued to have intermittent chest pain in the ED in  the ED physician discussed with cardiology who recommended observation.  Troponin was flat at 146 and trended down to 125.  EKG was showing some inverted T waves that were similar to priors.  His BUN and creatinine were a little elevated.  He was admitted for his chest pain and cardiology evaluated and an echocardiogram was done.  Cardiology felt that his chest pain was not indicative of ACS given that he had a cardiac catheterization in June that showed nonobstructive coronary disease and they recommended no pursuit of ischemic evaluation but recommended resuming aspirin 81 mg p.o. daily as well as statin.  PT OT evaluated recommending home health however family feels that they cannot take care of the patient so a SNF work-up was been initiated.  Patient was dialyzed today and had similar symptoms and had some dyspnea and abdominal tenderness during his dialysis session and it was felt that he had too much fluid pulled during his hemodialysis and they cut his treatment short due to his cramping and hypotension and they stopped his ultrafiltration with hemodialysis.  Subsequently nephrology gave the patient back 500 cc of fluid and he felt better.  Currently doing well but for some reason he was placed back on supplemental oxygen this morning but does not really dyspneic or hypoxic.  We will wean to room air.  Nephrology did dialysis today and they are recommending lowering the dry weight but not pull any fluid off with a next hemodialysis session.  Anticipating discharge going to SNF as  he is medically stable today.  **ADDENDUM 11/05/21: Patient remains stable to be discharged. Did not discharge yesterday given bed availability. Feels well and will continue Dialysis on his regular schedule.   Assessment and Plan: * Chest pain- (present on admission) -Had Chest Pain/Discomfort with Dyspnea after Dialysis two Dialysis sessions in a row -Troponin went from 146 -> 113 -> 125 and similar to previous labs and  suspected chronically due to ESRD -Was Chest Pain free -EKG similar to prior with inverted inferior T waves -Had a Mycardio perfusion study on 6/21 which showed a small defect of mderate severity in the apical inferior location -ECHO in 7/22 showed EF of 50-55% and Grade 2 DD with no significant valvular abnormalities -Cardiology consulted and he was evaluated for his recurrent chest pain and his symptoms and troponins were chronically elevated.  Patient had a cardiac catheterization back in June which showed nonobstructive coronary disease and a cardiology team recommended no further ischemic evaluation recommended continue home dose of aspirin 81 mg and increase the statin to 80 mg p.o. daily -Cardiology recommended checking lipids and liver panel in 8 weeks and recommended no further testing -Echocardiogram done and showed EF 50-55% and the Left Ventricl had no wall motion abnormalities and there is moderate concentric left ventricular hypertrophy and the ventricular diastolic parameters were consistent with grade 1 diastolic dysfunction.  Patient had severe MAC with protrusion with calcification on the annulus and into the left atrium and the cardiac echocardiogram showed no real significant change from prior study -Follow up with the Cardiology team in 4 to 6 weeks  Hyperphosphatemia -Patient's Phos Level went from 6.4 -> 5.3  -> 6.5 -> 5.5 -Continue to Monitor and Trend -Likely to further be corrected in Dialysis.  -Repeat Phos Level within 1 week  Leg pain -LE Duplex done and showed "There is no evidence of deep vein thrombosis in the lower extremity. However, portions of this examination were limited- see technologist comments above."   Hypokalemia -Patient is K+ is now 4.1 -Continue monitor and replete as necessary mag level is now 2.2 -Repeat CMP in a.m.  Dyspnea -Was dyspneic during his dialysis session as they feel that he may have had too much fluid pulled off of them -continue  supplemental oxygen as needed and for some reason he was back on it and not complaining of SOB -SpO2: 93 % O2 Flow Rate (L/min): 2 L/min; Wean to Room Air and no longer wearing Supplemental O2 via [place] -Dyspnea resolved but was wearing Supplemental O2 and his Cassia was not in his nose -CXR today showed "Stable exam. No acute cardiopulmonary findings."  TB lung, latent- (present on admission) -Resume home isoniazid 300 mg p.o. daily as well as home pyridoxine 50 mg p.o. daily  Dementia without behavioral disturbance ([place])- (present on admission) -Continue with Memantine 5 mg p.o. daily  Prolonged QT interval- (present on admission) -He had prolonged QTc of 549 admission -Replating and monitoring electrolytes; K+ is 4.1 now, and Mag is 2.2 -Avoiding QT prolongation medications  COPD (chronic obstructive pulmonary disease) ([place])- (present on admission) -Continue with Symbicort substitution with Breo Ellipta and also continue Spiriva Respimat and Albuterol Inhaler 2 puffs into lungs every 4 hours as needed -Continue to Monitor Respiratory Status carefully   PAF (paroxysmal atrial fibrillation) ([place])- (present on admission) -Continue telemetry monitoring and patient is rate controlled is not on anticoagulation due to recurrent GI bleeds -HR is ranging from 71-06  Chronic diastolic heart failure ([place])- (present on admission) -  Repeat Echocardiogram showed diastolic dysfunction consistent with grade 1 -Appears currently compensated and he has dialysis for volume maintenance -Nephrology likely dialyzed too much and had too much ultrafiltration so they given back 500 mL today -Strict I's and O's and daily weights; Patient is +1,016 mL since admission and Weight wass down 5 lbs since admission but he is now 6 lbs heavier than admit and ? Accuracy  Anemia of chronic disease- (present on admission) - Patient's hemoglobin/hematocrit went from 10.9/37.5 -> 11.3/37.3 -> 10.9/36.2 -> 11.4/37.4 -> 11.0/36.7  -> 9.9/32.7 -Check Anemia Panel and showed an iron level of 24, UIBC is 189, TIBC 213, saturation which is 11%, ferritin levels 100, folate was 5.7, vitamin B12 level was 313 -Continue to monitor for signs and symptoms bleeding; currently no overt bleeding noted -Repeat CBC within 1 week   ESRD (end stage renal disease) on dialysis [facility]) -Nephrology consulted for further evaluation and maintenance of hemodialysis -After further discussion with nephrology they feel that they pulled too much fluid off of him this morning and had to give him 500 mL back.  He was 3 kg under his dry weight -Further care per nephrology and his usual hemodialysis treatment was cut short due to his cramping and hypotension as well as dyspnea; Next dialysis session scheduled for 11/04/21 as he awaits SNF placement -Nephrology recommends continue home binders and calcitriol  -Patient's BUN/creatinine went from 9/5.10 -> 22/8.79 -> 24/8.63 -> 40/11.02 -> 56/12.82 -Continue to monitor trend renal function carefully and repeat CMP within 1 week   Pain control - [facility] Controlled Substance Reporting System database was reviewed. and patient was instructed, not to drive, operate heavy machinery, perform activities at heights, swimming or participation in water activities or provide baby-sitting services while on Pain, Sleep and Anxiety Medications; until their outpatient Physician has advised to do so again. Also recommended to not to take more than prescribed Pain, Sleep and Anxiety Medications.   Consultants: Nephrology  Procedures performed: HD  Disposition: Skilled nursing facility Diet recommendation:  Renal diet  DISCHARGE MEDICATION: Allergies as of 11/05/2021   No Known Allergies      Medication List     STOP taking these medications    Advair HFA 45-21 MCG/ACT inhaler Generic drug: fluticasone-salmeterol       TAKE these medications    albuterol 108 (90 Base) MCG/ACT inhaler Commonly known  as: Proventil HFA Inhale 2 puffs into the lungs every 4 (four) hours as needed for wheezing or shortness of breath.   aspirin EC 81 MG tablet Take 81 mg by mouth every morning. Swallow whole.   atorvastatin 20 MG tablet Commonly known as: LIPITOR TAKE 1 TABLET BY MOUTH EVERYDAY AT BEDTIME   budesonide-formoterol 160-4.5 MCG/ACT inhaler Commonly known as: SYMBICORT Inhale 2 puffs into the lungs daily.   calcium acetate 667 MG capsule Commonly known as: PHOSLO Take 667 mg by mouth 3 (three) times daily after meals.   carboxymethylcellulose 0.5 % Soln Commonly known as: REFRESH PLUS Place 1 drop into both eyes daily as needed (dry eyes).   carvedilol 6.25 MG tablet Commonly known as: COREG Take 1 tablet (6.25 mg total) by mouth 2 (two) times daily with a meal.   isoniazid 300 MG tablet Commonly known as: NYDRAZID TAKE 1 TABLET BY MOUTH EVERY DAY What changed: when to take this   lidocaine-prilocaine cream Commonly known as: EMLA Apply 1 application topically daily as needed (port access).   memantine 5 MG tablet Commonly known as: NAMENDA  Take 5 mg by mouth daily.   nicotine 14 mg/24hr patch Commonly known as: NICODERM CQ - dosed in mg/24 hours Place 1 patch (14 mg total) onto the skin daily.   pantoprazole 40 MG tablet Commonly known as: PROTONIX Take 1 tablet (40 mg total) by mouth daily.   pyridOXINE 50 MG tablet Commonly known as: VITAMIN B-6 Take 1 tablet (50 mg total) by mouth daily.   Spiriva Respimat 2.5 MCG/ACT Aers Generic drug: Tiotropium Bromide Monohydrate Inhale 2 puffs into the lungs daily.   traZODone 50 MG tablet Commonly known as: DESYREL Take 1-2 tablets (50-100 mg total) by mouth at bedtime as needed. What changed: when to take this       Discharge Exam: Filed Weights   11/04/21 0735 11/04/21 1101 11/05/21 0515  Weight: 66.7 kg 67.1 kg 66 kg   Today's Vitals   11/04/21 2153 11/05/21 0515 11/05/21 0741 11/05/21 1000  BP:  (!)  154/89  (!) 155/70  Pulse:  73    Resp:  20    Temp:  98 F (36.7 C)    TempSrc:  Oral    SpO2:  100% 100%   Weight:  66 kg    Height:      PainSc: Asleep      Body mass index is 22.79 kg/m.  Examination: Physical Exam:  Constitutional: Elderly thin AAM in NAD Respiratory: Diminished to auscultation bilaterally with coarse breath sounds, no wheezing, rales, rhonchi or crackles. Normal respiratory effort and patient is not tachypenic. No accessory muscle use. Not wearing supplemental O2 via [place] Cardiovascular: RRR, no murmurs / rubs / gallops. S1 and S2 auscultated. No extremity edema.  Abdomen: Soft, non-tender, non-distended. Bowel sounds positive.  GU: Deferred.  Condition at discharge: stable  The results of significant diagnostics from this hospitalization (including imaging, microbiology, ancillary and laboratory) are listed below for reference.   Imaging Studies: DG Chest 2 View  Result Date: 10/30/2021 CLINICAL DATA:  Shortness of breath EXAM: CHEST - 2 VIEW COMPARISON:  Previous studies including the examination of 09/09/2021 FINDINGS: Cardiac size is within normal limits. There are no signs of pulmonary edema. There are linear densities in the left lower lung fields. There is blunting of left lateral CP angle. There is no pneumothorax. Tip of right IJ dialysis catheter is seen in the right atrium. There is vascular coil in the left axilla. IMPRESSION: There are linear densities in the left lower lung fields suggesting subsegmental atelectasis. Possible small left pleural effusion. There are no signs of alveolar pulmonary edema. Tip of dialysis catheter is seen in the right atrium. Electronically Signed   By: Elmer Picker M.D.   On: 10/30/2021 18:05   DG Clavicle Left  Result Date: 10/12/2021 CLINICAL DATA:  Fall, pain EXAM: LEFT CLAVICLE - 2+ VIEWS COMPARISON:  None. FINDINGS: No acute fracture or dislocation identified. Small degenerative osteophytes at the  acromioclavicular joint. Vascular stent graft in the left axillary region noted. IMPRESSION: No acute fracture identified. Electronically Signed   By: Ofilia Neas M.D.   On: 10/12/2021 15:24   DG Clavicle Right  Result Date: 10/12/2021 CLINICAL DATA:  Fall, pain EXAM: RIGHT CLAVICLE - 2+ VIEWS COMPARISON:  None. FINDINGS: No acute fracture or dislocation identified. Small degenerative osteophytes at the acromioclavicular joint. IMPRESSION: No acute fracture identified. Electronically Signed   By: Ofilia Neas M.D.   On: 10/12/2021 15:23   CT Head Wo Contrast  Result Date: 10/12/2021 CLINICAL DATA:  Fall down steps, head  trauma, left-sided facial abrasion, loss of consciousness EXAM: CT HEAD WITHOUT CONTRAST CT MAXILLOFACIAL WITHOUT CONTRAST CT CERVICAL SPINE WITHOUT CONTRAST TECHNIQUE: Multidetector CT imaging of the head, cervical spine, and maxillofacial structures were performed using the standard protocol without intravenous contrast. Multiplanar CT image reconstructions of the cervical spine and maxillofacial structures were also generated. RADIATION DOSE REDUCTION: This exam was performed according to the departmental dose-optimization program which includes automated exposure control, adjustment of the mA and/or kV according to patient size and/or use of iterative reconstruction technique. COMPARISON:  09/30/2021 FINDINGS: CT HEAD FINDINGS Brain: No evidence of acute infarction, hemorrhage, hydrocephalus, extra-axial collection or mass lesion/mass effect. Nonacute lacunar infarction of the right caudate. Periventricular and deep white matter hypodensity. Unchanged prominence of the bilateral frontal subdural spaces. Vascular: No hyperdense vessel or unexpected calcification. CT FACIAL BONES FINDINGS Skull: Normal. Negative for fracture or focal lesion. Facial bones: No displaced facial bone fractures or dislocations. Sinuses/Orbits: Depressed fracture of the left orbital floor, largest  fracture fragment measuring at least 1.0 x 1.2 cm, with 0.6 cm depression. There is fat herniation but no rectus herniation. The left orbital contents are intact by CT. High attenuation opacification of the left maxillary sinus. Other: Soft tissue contusion about the left orbit and forehead. CT CERVICAL SPINE FINDINGS Alignment: Normal. Skull base and vertebrae: No acute fracture. No primary bone lesion or focal pathologic process. Soft tissues and spinal canal: No prevertebral fluid or swelling. No visible canal hematoma. Disc levels: Moderate to severe multilevel disc space height loss and osteophytosis from C3 through C7. Upper chest: Negative. Other: None. IMPRESSION: 1. No acute intracranial pathology. 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 2. Depressed fracture of the left orbital floor, largest fracture fragment measuring at least 1.0 x 1.2 cm, with 0.6 cm depression. There is fat herniation but no rectus herniation. The left orbital contents are intact by CT. 3. Blood product within the left maxillary sinus. 4. No additional displaced fracture of the facial bones. 5. Soft tissue contusion about the left orbit and forehead. 6. No fracture or static subluxation of the cervical spine. Moderate to severe multilevel cervical disc degenerative disease. Electronically Signed   By: Delanna Ahmadi M.D.   On: 10/12/2021 15:21   CT Cervical Spine Wo Contrast  Result Date: 10/12/2021 CLINICAL DATA:  Fall down steps, head trauma, left-sided facial abrasion, loss of consciousness EXAM: CT HEAD WITHOUT CONTRAST CT MAXILLOFACIAL WITHOUT CONTRAST CT CERVICAL SPINE WITHOUT CONTRAST TECHNIQUE: Multidetector CT imaging of the head, cervical spine, and maxillofacial structures were performed using the standard protocol without intravenous contrast. Multiplanar CT image reconstructions of the cervical spine and maxillofacial structures were also generated. RADIATION DOSE  REDUCTION: This exam was performed according to the departmental dose-optimization program which includes automated exposure control, adjustment of the mA and/or kV according to patient size and/or use of iterative reconstruction technique. COMPARISON:  09/30/2021 FINDINGS: CT HEAD FINDINGS Brain: No evidence of acute infarction, hemorrhage, hydrocephalus, extra-axial collection or mass lesion/mass effect. Nonacute lacunar infarction of the right caudate. Periventricular and deep white matter hypodensity. Unchanged prominence of the bilateral frontal subdural spaces. Vascular: No hyperdense vessel or unexpected calcification. CT FACIAL BONES FINDINGS Skull: Normal. Negative for fracture or focal lesion. Facial bones: No displaced facial bone fractures or dislocations. Sinuses/Orbits: Depressed fracture of the left orbital floor, largest fracture fragment measuring at least 1.0 x 1.2 cm, with 0.6 cm depression. There is fat herniation but no rectus herniation. The left orbital  contents are intact by CT. High attenuation opacification of the left maxillary sinus. Other: Soft tissue contusion about the left orbit and forehead. CT CERVICAL SPINE FINDINGS Alignment: Normal. Skull base and vertebrae: No acute fracture. No primary bone lesion or focal pathologic process. Soft tissues and spinal canal: No prevertebral fluid or swelling. No visible canal hematoma. Disc levels: Moderate to severe multilevel disc space height loss and osteophytosis from C3 through C7. Upper chest: Negative. Other: None. IMPRESSION: 1. No acute intracranial pathology. 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 2. Depressed fracture of the left orbital floor, largest fracture fragment measuring at least 1.0 x 1.2 cm, with 0.6 cm depression. There is fat herniation but no rectus herniation. The left orbital contents are intact by CT. 3. Blood product within the left maxillary sinus. 4. No additional  displaced fracture of the facial bones. 5. Soft tissue contusion about the left orbit and forehead. 6. No fracture or static subluxation of the cervical spine. Moderate to severe multilevel cervical disc degenerative disease. Electronically Signed   By: Delanna Ahmadi M.D.   On: 10/12/2021 15:21   DG CHEST PORT 1 VIEW  Result Date: 11/04/2021 CLINICAL DATA:  Shortness of breath. EXAM: PORTABLE CHEST 1 VIEW COMPARISON:  10/30/2021 FINDINGS: 0517 hours. The cardio pericardial silhouette is enlarged. Similar streaky opacities the bases compatible with atelectasis or scarring. No edema or focal airspace consolidation. Right dialysis catheter remains in place per Telemetry leads overlie the chest. IMPRESSION: Stable exam. No acute cardiopulmonary findings. Electronically Signed   By: Misty Stanley M.D.   On: 11/04/2021 07:58   ECHOCARDIOGRAM COMPLETE  Result Date: 10/31/2021    ECHOCARDIOGRAM REPORT   Patient Name:   Anthony [name] Date of Exam: 10/31/2021 Medical Rec #:  [id number]      Height:       67.0 in Accession #:    [id number]     Weight:       154.9 lb Date of Birth:  21-Aug-1941       BSA:          1.814 m Patient Age:    17 years       BP:           131/70 mmHg Patient Gender: M              HR:           60 bpm. Exam Location:  Inpatient Procedure: 2D Echo Indications:    Chest pain  History:        Patient has prior history of Echocardiogram examinations, most                 recent 04/17/2021. CAD, COPD; Risk Factors:Hypertension.  Sonographer:    Jefferey Pica Referring Phys: [id number] Wilmon Conover LATIF [place]  1. Left ventricular ejection fraction, by estimation, is 50 to 55%. The left ventricle has low normal function. The left ventricle has no regional wall motion abnormalities. There is moderate concentric left ventricular hypertrophy. Left ventricular diastolic parameters are consistent with Grade I diastolic dysfunction (impaired relaxation). Elevated left ventricular end-diastolic pressure.   2. Right ventricular systolic function is normal. The right ventricular size is normal. Tricuspid regurgitation signal is inadequate for assessing PA pressure.  3. Left atrial size was moderately dilated.  4. Severe MAC with protrusion of calcification beyond annulus and into left atrium. The mitral valve is grossly normal. Trivial mitral valve regurgitation. Severe mitral annular calcification.  5.  The aortic valve is grossly normal. There is moderate calcification of the aortic valve. Aortic valve regurgitation is not visualized. Aortic valve sclerosis/calcification is present, without any evidence of aortic stenosis.  6. The inferior vena cava is normal in size with greater than 50% respiratory variability, suggesting right atrial pressure of 3 mmHg. Comparison(s): No significant change from prior study. FINDINGS  Left Ventricle: Left ventricular ejection fraction, by estimation, is 50 to 55%. The left ventricle has low normal function. The left ventricle has no regional wall motion abnormalities. The left ventricular internal cavity size was normal in size. There is moderate concentric left ventricular hypertrophy. Left ventricular diastolic parameters are consistent with Grade I diastolic dysfunction (impaired relaxation). Elevated left ventricular end-diastolic pressure. Right Ventricle: The right ventricular size is normal. No increase in right ventricular wall thickness. Right ventricular systolic function is normal. Tricuspid regurgitation signal is inadequate for assessing PA pressure. Left Atrium: Left atrial size was moderately dilated. Right Atrium: Right atrial size was normal in size. Pericardium: There is no evidence of pericardial effusion. Mitral Valve: Severe MAC with protrusion of calcification beyond annulus and into left atrium. The mitral valve is grossly normal. Severe mitral annular calcification. Trivial mitral valve regurgitation. Tricuspid Valve: The tricuspid valve is normal in  structure. Tricuspid valve regurgitation is trivial. No evidence of tricuspid stenosis. Aortic Valve: The aortic valve is grossly normal. There is moderate calcification of the aortic valve. Aortic valve regurgitation is not visualized. Aortic valve sclerosis/calcification is present, without any evidence of aortic stenosis. Aortic valve peak gradient measures 8.8 mmHg. Pulmonic Valve: The pulmonic valve was not well visualized. Pulmonic valve regurgitation is not visualized. Aorta: The aortic root and ascending aorta are structurally normal, with no evidence of dilitation. Venous: The inferior vena cava is normal in size with greater than 50% respiratory variability, suggesting right atrial pressure of 3 mmHg. IAS/Shunts: The atrial septum is grossly normal.  LEFT VENTRICLE PLAX 2D LVIDd:         4.50 cm     Diastology LVIDs:         3.80 cm     LV e' medial:    4.06 cm/s LV PW:         1.60 cm     LV E/e' medial:  14.2 LV IVS:        2.10 cm     LV e' lateral:   2.62 cm/s LVOT diam:     2.00 cm     LV E/e' lateral: 21.9 LV SV:         69 LV SV Index:   38 LVOT Area:     3.14 cm  LV Volumes (MOD) LV vol d, MOD A4C: 92.2 ml LV vol s, MOD A4C: 59.0 ml LV SV MOD A4C:     92.2 ml RIGHT VENTRICLE            IVC RV Basal diam:  2.60 cm    IVC diam: 1.70 cm RV S prime:     9.56 cm/s TAPSE (M-mode): 1.9 cm LEFT ATRIUM             Index        RIGHT ATRIUM           Index LA diam:        3.90 cm 2.15 cm/m   RA Area:     14.00 cm LA Vol (A2C):   69.6 ml 38.37 ml/m  RA Volume:   33.70 ml  18.58  ml/m LA Vol (A4C):   55.2 ml 30.43 ml/m LA Biplane Vol: 61.3 ml 33.79 ml/m  AORTIC VALVE                 PULMONIC VALVE AV Area (Vmax): 2.48 cm     PV Vmax:       0.75 m/s AV Vmax:        148.50 cm/s  PV Peak grad:  2.3 mmHg AV Peak Grad:   8.8 mmHg LVOT Vmax:      117.00 cm/s LVOT Vmean:     81.000 cm/s LVOT VTI:       0.221 m  AORTA Ao Root diam: 3.20 cm MITRAL VALVE MV Area (PHT): 2.54 cm     SHUNTS MV Decel Time: 299 msec      Systemic VTI:  0.22 m MV E velocity: 57.50 cm/s   Systemic Diam: 2.00 cm MV A velocity: 110.00 cm/s MV E/A ratio:  0.52 Anthony Dresser MD Electronically signed by Anthony Dresser MD Signature Date/Time: 10/31/2021/6:18:12 PM    Final    VAS [place] LOWER EXTREMITY VENOUS (DVT)  Result Date: 10/31/2021  Lower Venous DVT Study Patient Name:  JMARI PELC  Date of Exam:   10/31/2021 Medical Rec #: [id number]       Accession #:    [id number] Date of Birth: 11-25-1940        Patient Gender: M Patient Age:   6 years Exam [facility]  [facility] Procedure:      VAS [place] LOWER EXTREMITY VENOUS (DVT) Referring Phys: CHING TU --------------------------------------------------------------------------------  Indications: Pain.  Limitations: Veins shadowed out by calcifications, scarring in left calf. Comparison Study: No prior study on file Performing Technologist: Sharion Dove RVS  Examination Guidelines: A complete evaluation includes B-mode imaging, spectral Doppler, color Doppler, and power Doppler as needed of all accessible portions of each vessel. Bilateral testing is considered an integral part of a complete examination. Limited examinations for reoccurring indications may be performed as noted. The reflux portion of the exam is performed with the patient in reverse Trendelenburg.  +-----+---------------+---------+-----------+----------+--------------+  RIGHT Compressibility Phasicity Spontaneity Properties Thrombus Aging  +-----+---------------+---------+-----------+----------+--------------+  CFV   Full            Yes       Yes                                    +-----+---------------+---------+-----------+----------+--------------+   +---------+---------------+---------+-----------+----------+-------------------+  LEFT      Compressibility Phasicity Spontaneity Properties Thrombus Aging       +---------+---------------+---------+-----------+----------+-------------------+  CFV       Full             Yes       Yes                                         +---------+---------------+---------+-----------+----------+-------------------+  SFJ       Full                                                                  +---------+---------------+---------+-----------+----------+-------------------+  FV Prox  Full            Yes       Yes                                         +---------+---------------+---------+-----------+----------+-------------------+  FV Mid    Full                                                                  +---------+---------------+---------+-----------+----------+-------------------+  FV Distal Full            Yes       Yes                                         +---------+---------------+---------+-----------+----------+-------------------+  PFV       Full            Yes       Yes                                         +---------+---------------+---------+-----------+----------+-------------------+  POP       Full                                                                  +---------+---------------+---------+-----------+----------+-------------------+  PTV                                                        Not well visualized  +---------+---------------+---------+-----------+----------+-------------------+  PERO                                                       Not well visualized  +---------+---------------+---------+-----------+----------+-------------------+    Summary: RIGHT: - No evidence of common femoral vein obstruction.  LEFT: - There is no evidence of deep vein thrombosis in the lower extremity. However, portions of this examination were limited- see technologist comments above.  - No cystic structure found in the popliteal fossa.  *See table(s) above for measurements and observations. Electronically signed by Anthony Martinez MD on 10/31/2021 at 6:35:01 PM.    Final    CT Maxillofacial Wo Contrast  Result Date: 10/12/2021 CLINICAL DATA:  Fall down steps,  head trauma, left-sided facial abrasion, loss of consciousness EXAM: CT HEAD WITHOUT CONTRAST CT MAXILLOFACIAL WITHOUT CONTRAST CT CERVICAL SPINE WITHOUT CONTRAST TECHNIQUE: Multidetector CT imaging of the head, cervical spine, and maxillofacial structures were performed using the standard protocol without intravenous contrast. Multiplanar CT  image reconstructions of the cervical spine and maxillofacial structures were also generated. RADIATION DOSE REDUCTION: This exam was performed according to the departmental dose-optimization program which includes automated exposure control, adjustment of the mA and/or kV according to patient size and/or use of iterative reconstruction technique. COMPARISON:  09/30/2021 FINDINGS: CT HEAD FINDINGS Brain: No evidence of acute infarction, hemorrhage, hydrocephalus, extra-axial collection or mass lesion/mass effect. Nonacute lacunar infarction of the right caudate. Periventricular and deep white matter hypodensity. Unchanged prominence of the bilateral frontal subdural spaces. Vascular: No hyperdense vessel or unexpected calcification. CT FACIAL BONES FINDINGS Skull: Normal. Negative for fracture or focal lesion. Facial bones: No displaced facial bone fractures or dislocations. Sinuses/Orbits: Depressed fracture of the left orbital floor, largest fracture fragment measuring at least 1.0 x 1.2 cm, with 0.6 cm depression. There is fat herniation but no rectus herniation. The left orbital contents are intact by CT. High attenuation opacification of the left maxillary sinus. Other: Soft tissue contusion about the left orbit and forehead. CT CERVICAL SPINE FINDINGS Alignment: Normal. Skull base and vertebrae: No acute fracture. No primary bone lesion or focal pathologic process. Soft tissues and spinal canal: No prevertebral fluid or swelling. No visible canal hematoma. Disc levels: Moderate to severe multilevel disc space height loss and osteophytosis from C3 through C7. Upper chest:  Negative. Other: None. IMPRESSION: 1. No acute intracranial pathology. 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 2. Depressed fracture of the left orbital floor, largest fracture fragment measuring at least 1.0 x 1.2 cm, with 0.6 cm depression. There is fat herniation but no rectus herniation. The left orbital contents are intact by CT. 3. Blood product within the left maxillary sinus. 4. No additional displaced fracture of the facial bones. 5. Soft tissue contusion about the left orbit and forehead. 6. No fracture or static subluxation of the cervical spine. Moderate to severe multilevel cervical disc degenerative disease. Electronically Signed   By: Delanna Ahmadi M.D.   On: 10/12/2021 15:21    Microbiology: Results for orders placed or performed during the hospital encounter of 10/30/21  Resp Panel by RT-PCR (Flu A&B, Covid) Nasopharyngeal Swab     Status: None   Collection Time: 10/30/21  4:11 PM   Specimen: Nasopharyngeal Swab; Nasopharyngeal(NP) swabs in vial transport medium  Result Value Ref Range Status   SARS Coronavirus 2 by RT PCR NEGATIVE NEGATIVE Final    Comment: (NOTE) SARS-CoV-2 target nucleic acids are NOT DETECTED.  The SARS-CoV-2 RNA is generally detectable in upper respiratory specimens during the acute phase of infection. The lowest concentration of SARS-CoV-2 viral copies this assay can detect is 138 copies/mL. A negative result does not preclude SARS-Cov-2 infection and should not be used as the sole basis for treatment or other patient management decisions. A negative result may occur with  improper specimen collection/handling, submission of specimen other than nasopharyngeal swab, presence of viral mutation(s) within the areas targeted by this assay, and inadequate number of viral copies(<138 copies/mL). A negative result must be combined with clinical observations, patient history, and epidemiological information. The  expected result is Negative.  Fact Sheet for Patients:  EntrepreneurPulse.com.au  Fact Sheet for Healthcare Providers:  IncredibleEmployment.be  This test is no t yet approved or cleared by the [place] FDA and  has been authorized for detection and/or diagnosis of SARS-CoV-2 by FDA under an Emergency Use Authorization (EUA). This EUA will remain  in effect (meaning this test can be used) for the duration of  the COVID-19 declaration under Section 564(b)(1) of the Act, 21 U.S.C.section 360bbb-3(b)(1), unless the authorization is terminated  or revoked sooner.       Influenza A by PCR NEGATIVE NEGATIVE Final   Influenza B by PCR NEGATIVE NEGATIVE Final    Comment: (NOTE) The Xpert Xpress SARS-CoV-2/FLU/RSV plus assay is intended as an aid in the diagnosis of influenza from Nasopharyngeal swab specimens and should not be used as a sole basis for treatment. Nasal washings and aspirates are unacceptable for Xpert Xpress SARS-CoV-2/FLU/RSV testing.  Fact Sheet for Patients: EntrepreneurPulse.com.au  Fact Sheet for Healthcare Providers: IncredibleEmployment.be  This test is not yet approved or cleared by the [place] FDA and has been authorized for detection and/or diagnosis of SARS-CoV-2 by FDA under an Emergency Use Authorization (EUA). This EUA will remain in effect (meaning this test can be used) for the duration of the COVID-19 declaration under Section 564(b)(1) of the Act, 21 U.S.C. section 360bbb-3(b)(1), unless the authorization is terminated or revoked.  Performed at [facility], [place] [address], [place], [place] [address]   Resp Panel by RT-PCR (Flu A&B, Covid) Nasopharyngeal Swab     Status: None   Collection Time: 10/30/21  8:16 PM   Specimen: Nasopharyngeal Swab; Nasopharyngeal(NP) swabs in vial transport medium  Result Value Ref Range Status   SARS Coronavirus 2 by RT PCR  NEGATIVE NEGATIVE Final    Comment: (NOTE) SARS-CoV-2 target nucleic acids are NOT DETECTED.  The SARS-CoV-2 RNA is generally detectable in upper respiratory specimens during the acute phase of infection. The lowest concentration of SARS-CoV-2 viral copies this assay can detect is 138 copies/mL. A negative result does not preclude SARS-Cov-2 infection and should not be used as the sole basis for treatment or other patient management decisions. A negative result may occur with  improper specimen collection/handling, submission of specimen other than nasopharyngeal swab, presence of viral mutation(s) within the areas targeted by this assay, and inadequate number of viral copies(<138 copies/mL). A negative result must be combined with clinical observations, patient history, and epidemiological information. The expected result is Negative.  Fact Sheet for Patients:  EntrepreneurPulse.com.au  Fact Sheet for Healthcare Providers:  IncredibleEmployment.be  This test is no t yet approved or cleared by the [place] FDA and  has been authorized for detection and/or diagnosis of SARS-CoV-2 by FDA under an Emergency Use Authorization (EUA). This EUA will remain  in effect (meaning this test can be used) for the duration of the COVID-19 declaration under Section 564(b)(1) of the Act, 21 U.S.C.section 360bbb-3(b)(1), unless the authorization is terminated  or revoked sooner.       Influenza A by PCR NEGATIVE NEGATIVE Final   Influenza B by PCR NEGATIVE NEGATIVE Final    Comment: (NOTE) The Xpert Xpress SARS-CoV-2/FLU/RSV plus assay is intended as an aid in the diagnosis of influenza from Nasopharyngeal swab specimens and should not be used as a sole basis for treatment. Nasal washings and aspirates are unacceptable for Xpert Xpress SARS-CoV-2/FLU/RSV testing.  Fact Sheet for Patients: EntrepreneurPulse.com.au  Fact Sheet for  Healthcare Providers: IncredibleEmployment.be  This test is not yet approved or cleared by the [place] FDA and has been authorized for detection and/or diagnosis of SARS-CoV-2 by FDA under an Emergency Use Authorization (EUA). This EUA will remain in effect (meaning this test can be used) for the duration of the COVID-19 declaration under Section 564(b)(1) of the Act, 21 U.S.C. section 360bbb-3(b)(1), unless the authorization is terminated or revoked.  Performed  at [facility], [place] [address], [place], [place] [address]    Labs: CBC: Recent Labs  Lab 10/30/21 1638 10/30/21 1704 10/31/21 1823 11/01/21 7416 11/01/21 0801 11/02/21 0332 11/03/21 0402 11/04/21 0444 11/04/21 0809  WBC 6.3  --  4.9   < > 5.1 6.2 5.8 6.5 6.8  NEUTROABS 4.2  --  3.0  --   --  3.8 3.8 4.3  --   HGB 11.2*   < > 10.9*   < > 10.9* 11.4* 11.0* 10.2* 9.9*  HCT 38.0*   < > 37.5*   < > 36.2* 37.4* 36.7* 32.5* 32.7*  MCV 91.3  --  92.1   < > 91.9 89.5 89.7 88.8 88.9  PLT 246  --  292   < > 287 249 263 238 267   < > = values in this interval not displayed.   Basic Metabolic Panel: Recent Labs  Lab 10/30/21 1638 10/30/21 1704 10/31/21 0107 10/31/21 1823 11/02/21 0332 11/03/21 0402 11/04/21 0444  NA 136 135  --  140 136 140 139  K 3.1* 3.1*  --  3.8 3.9 4.0 4.1  CL 94* 95*  --  98 97* 100 99  CO2 28  --   --  _0 GLUCOSE 83 89  --  109* 100* 123* 114*  BUN 8 9  --  22 24* 40* 56*  CREATININE 4.83* 5.10*  --  8.79* 8.63* 11.02* 12.82*  CALCIUM 8.0*  --   --  8.6* 8.8* 8.7* 8.5*  MG  --   --  2.0 2.2 2.2 2.3 2.2  PHOS  --   --   --  6.4* 5.3* 6.5* 5.5*   Liver Function Tests: Recent Labs  Lab 10/30/21 1638 10/31/21 1823 11/02/21 0332 11/03/21 0402 11/04/21 0444  AST 16 8* 12* 9* 8*  ALT 6 <5 <5 <5 <5  ALKPHOS 85 87 89 80 72  BILITOT 0.6 0.3 0.5 0.2* 0.6  PROT 7.8 6.9 7.4 6.9 6.6  ALBUMIN 3.2* 2.9* 3.0* 2.9* 2.8*   CBG: Recent Labs  Lab  10/30/21 1634 11/01/21 0939  GLUCAP 98 87    Discharge time spent: greater than 30 minutes.  Signed: Raiford Noble, DO [facility] 11/05/2021

## 2021-11-05 NOTE — NC FL2 (Signed)
[place] LEVEL OF CARE SCREENING TOOL     IDENTIFICATION  Patient Name: Anthony [name] Birthdate: 09/04/1941 Sex: male Admission Date (Current Location): 10/30/2021  [facility] and [place] Number:  Herbalist and Address:  [facility], [place] [address] [place], [place] [address]      Provider Number: [id number]  Attending Physician Name and Address:  Kerney Elbe, DO  Relative Name and Phone Number:  Jarron Curley, [phone]    Current Level of Care: Hospital Recommended Level of Care: [place] Prior Approval Number:    Date Approved/Denied:   PASRR Number: [id number]  Discharge Plan: SNF    Current Diagnoses: Patient Active Problem List   Diagnosis Date Noted   Vaccine counseling 11/04/2021   Hyperphosphatemia 11/02/2021   Dyspnea 11/01/2021   Hypokalemia 11/01/2021   Leg pain 11/01/2021   Positive QuantiFERON-TB 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 05/16/2021   TB lung, latent 05/16/2021   Mitral regurgitation 05/16/2021   Malnutrition of moderate degree 04/18/2021   Anemia 04/16/2021   Symptomatic anemia 04/16/2021   COPD (chronic obstructive pulmonary disease) ([place]) 04/16/2021   CAD (coronary artery disease) 04/16/2021   HLD (hyperlipidemia) 04/16/2021   Prolonged QT interval 04/16/2021   Abnormal ECG 04/16/2021   Dementia without behavioral disturbance ([place]) 04/16/2021   COPD with acute exacerbation ([place]) 04/16/2021   Occult blood positive stool 04/16/2021   COPD exacerbation ([place]) 03/01/2020   Tobacco use disorder 03/01/2020   Chronic diastolic heart failure (HCC)    PAF (paroxysmal atrial fibrillation) (HCC)    Anemia of chronic disease 02/29/2020   Hypertensive urgency 02/29/2020   Elevated troponin 02/29/2020   Chest pain 02/29/2020   ESRD (end stage renal disease) on dialysis ([place]) 12/15/2019   HTN (hypertension) 12/15/2019   Insomnia     Orientation RESPIRATION BLADDER Height & Weight     Self,  Place  O2 ([place] 3.5) Continent Weight: 145 lb 8 oz (66 kg) Height:  5\' 7"  (170.2 cm)  BEHAVIORAL SYMPTOMS/MOOD NEUROLOGICAL BOWEL NUTRITION STATUS      Continent Diet (See DC summary)  AMBULATORY STATUS COMMUNICATION OF NEEDS Skin   Supervision Verbally Skin abrasions                       Personal Care Assistance Level of Assistance  Bathing, Feeding, Dressing Bathing Assistance: Limited assistance Feeding assistance: Independent Dressing Assistance: Limited assistance     Functional Limitations Info  Sight, Hearing, Speech Sight Info: Adequate Hearing Info: Adequate Speech Info: Adequate    SPECIAL CARE FACTORS FREQUENCY  PT (By licensed PT), OT (By licensed OT)     PT Frequency: 5x week OT Frequency: 5x week            Contractures Contractures Info: Not present    Additional Factors Info  Code Status, Allergies Code Status Info: Full Allergies Info: NKA           Current Medications (11/05/2021):  This is the current hospital active medication list Current Facility-Administered Medications  Medication Dose Route Frequency Provider Last Rate Last Admin   acetaminophen (TYLENOL) tablet 650 mg  650 mg Oral Q4H PRN Tu, Ching T, DO   650 mg at 11/04/21 2108   albuterol (PROVENTIL) (2.5 MG/3ML) 0.083% nebulizer solution 2.5 mg  2.5 mg Inhalation Q4H PRN Raiford Noble [place], DO       aspirin EC tablet 81 mg  81 mg Oral Daily [place], Georgina Quint [place], [place]  81 mg at 11/04/21 1213   atorvastatin (LIPITOR) tablet 80 mg  80 mg Oral Daily Raiford Noble [place], DO   80 mg at 11/04/21 1213   Chlorhexidine Gluconate Cloth 2 % PADS 6 each  6 each Topical Q0600 Edrick Oh, MD   6 each at 11/04/21 1214   ferric gluconate (FERRLECIT) 250 mg in sodium chloride 0.9 % 250 mL IVPB  250 mg Intravenous Q M,W,F-HD Gean Quint, MD   Stopped at 11/04/21 1220   fluticasone furoate-vilanterol (BREO ELLIPTA) 200-25 MCG/ACT 1 puff  1 puff Inhalation Daily Raiford Noble [place], DO   1 puff at  11/05/21 0741   heparin injection 5,000 Units  5,000 Units Subcutaneous Q8H Tu, Ching T, DO   5,000 Units at 11/05/21 0522   isoniazid (NYDRAZID) tablet 300 mg  300 mg Oral Daily Raiford Noble [place], DO   300 mg at 11/04/21 1215   memantine (NAMENDA) tablet 5 mg  5 mg Oral Daily Opyd, Ilene Qua, MD   5 mg at 11/04/21 1213   nitroGLYCERIN (NITROSTAT) SL tablet 0.4 mg  0.4 mg Sublingual Q5 min PRN Regan [place], MD       pantoprazole (PROTONIX) EC tablet 40 mg  40 mg Oral Daily Raiford Noble [place], DO   40 mg at 11/04/21 1213   pyridOXINE (VITAMIN B-6) tablet 50 mg  50 mg Oral Daily Raiford Noble [place], DO   50 mg at 11/04/21 1215   traZODone (DESYREL) tablet 50-100 mg  50-100 mg Oral QHS PRN Opyd, Ilene Qua, MD   50 mg at 10/31/21 2344   umeclidinium bromide (INCRUSE ELLIPTA) 62.5 MCG/ACT 1 puff  1 puff Inhalation Daily Raiford Noble [place], DO   1 puff at 11/05/21 8119     Discharge Medications: Please see discharge summary for a list of discharge medications.  Relevant Imaging Results:  Relevant Lab Results:   Additional Information SS# [place], [place]

## 2021-11-05 NOTE — Progress Notes (Signed)
SATURATION QUALIFICATIONS: (This note is used to comply with regulatory documentation for home oxygen)  Patient Saturations on Room Air at Rest = 96%  Patient Saturations on Room Air while Ambulating = 91%  Patient Saturations on N/A Liters of oxygen while Ambulating = N/A%  Please briefly explain why patient needs home oxygen: Pt doesn't need supplemental O2 currently.  [place] Pager [phone] Office [phone]

## 2021-11-05 NOTE — Progress Notes (Signed)
Physical Therapy Treatment Patient Details Name: Anthony [name] MRN: [id number] DOB: 1941/06/07 Today's Date: 11/05/2021   History of Present Illness The pt is a 81 yo male presenting 2/8 with SOB and chest pain during HD. Work up with no acute fidings, cardiology consulted and signed off. PMH includes: alcohol use, anemia, CHF, CKD on HD MWF, mild dementia, HTN, CVA, and latent TB.    PT Comments    Pt used walker today due to rt hip pain. Pt a little over ambitious with distance and had to have someone bring a chair for pt to sit and rest in the hallway.    Recommendations for follow up therapy are one component of a multi-disciplinary discharge planning process, led by the attending physician.  Recommendations may be updated based on patient status, additional functional criteria and insurance authorization.  Follow Up Recommendations  Skilled nursing-short term rehab (<3 hours/day) (family unable to care for pt)     Assistance Recommended at Discharge Intermittent Supervision/Assistance  Patient can return home with the following A little help with walking and/or transfers;Assistance with cooking/housework;Direct supervision/assist for financial management;Direct supervision/assist for medications management;Assist for transportation;Help with stairs or ramp for entrance   Equipment Recommendations  None recommended by PT    Recommendations for Other Services       Precautions / Restrictions Precautions Precautions: Fall     Mobility  Bed Mobility Overal bed mobility: Needs Assistance Bed Mobility: Supine to Sit     Supine to sit: Min assist     General bed mobility comments: Assist to elevate trunk into sitting    Transfers Overall transfer level: Needs assistance Equipment used: Rolling walker (2 wheels) Transfers: Sit to/from Stand Sit to Stand: Min guard           General transfer comment: Assist for safety    Ambulation/Gait Ambulation/Gait assistance:  Min guard Gait Distance (Feet): 150 Feet (150' x 1, 60' x 1) Assistive device: Rolling walker (2 wheels) Gait Pattern/deviations: Step-through pattern, Decreased step length - right, Decreased step length - left, Trunk flexed Gait velocity: decr Gait velocity interpretation: 1.31 - 2.62 ft/sec, indicative of limited community ambulator   General Gait Details: Pt with 3 standing rest breaks flexed forward leaning on walker. One sitting rest break. Verbal cues to keep feet closer to walker with turns. Dyspnea 2-3/4 on RA. SpO2 >92% on RA.   Stairs             Wheelchair Mobility    Modified Rankin (Stroke Patients Only)       Balance Overall balance assessment: Needs assistance Sitting-balance support: No upper extremity supported Sitting balance-Leahy Scale: Good     Standing balance support: No upper extremity supported Standing balance-Leahy Scale: Fair                              Cognition Arousal/Alertness: Awake/alert Behavior During Therapy: WFL for tasks assessed/performed Overall Cognitive Status: History of cognitive impairments - at baseline                                          Exercises      General Comments        Pertinent Vitals/Pain Pain Assessment Pain Assessment: Faces Faces Pain Scale: Hurts little more Pain Location: rt hip with amb Pain Descriptors / Indicators: Grimacing Pain Intervention(s):  Limited activity within patient's tolerance, Repositioned    Home Living                          Prior Function            PT Goals (current goals can now be found in the care plan section) Progress towards PT goals: Progressing toward goals    Frequency    Min 3X/week      PT Plan Discharge plan needs to be updated    Co-evaluation              AM-PAC PT "6 Clicks" Mobility   Outcome Measure  Help needed turning from your back to your side while in a flat bed without using  bedrails?: None Help needed moving from lying on your back to sitting on the side of a flat bed without using bedrails?: A Little Help needed moving to and from a bed to a chair (including a wheelchair)?: A Little Help needed standing up from a chair using your arms (e.g., wheelchair or bedside chair)?: A Little Help needed to walk in hospital room?: A Little Help needed climbing 3-5 steps with a railing? : A Little 6 Click Score: 19    End of Session Equipment Utilized During Treatment: Gait belt Activity Tolerance: Patient limited by fatigue Patient left: with call bell/phone within reach;in chair;with chair alarm set   PT Visit Diagnosis: Muscle weakness (generalized) (M62.81);Unsteadiness on feet (R26.81);Other abnormalities of gait and mobility (R26.89);Difficulty in walking, not elsewhere classified (R26.2)     Time: 0211-1735 PT Time Calculation (min) (ACUTE ONLY): 21 min  Charges:  $Gait Training: 8-22 mins                     [place] Pager [phone] Office [place] 11/05/2021, 10:26 AM

## 2021-11-05 NOTE — Progress Notes (Signed)
°  [facility] Progress Note   Subjective: Seen and examined in room this AM. No complaints, denies any chest pain. Tolerated HD yesterday without issues, ran net even (net UF 0)  Objective Vitals:   11/04/21 2100 11/04/21 2106 11/05/21 0515 11/05/21 0741  BP:  (!) 164/68 (!) 154/89   Pulse:   73   Resp: 20  20   Temp:   98 F (36.7 C)   TempSrc:   Oral   SpO2:   100% 100%  Weight:   66 kg   Height:         Additional Objective Labs: Basic Metabolic Panel: Recent Labs  Lab 11/02/21 0332 11/03/21 0402 11/04/21 0444  NA 136 140 139  K 3.9 4.0 4.1  CL 97* 100 99  CO2 26 24 24   GLUCOSE 100* 123* 114*  BUN 24* 40* 56*  CREATININE 8.63* 11.02* 12.82*  CALCIUM 8.8* 8.7* 8.5*  PHOS 5.3* 6.5* 5.5*   CBC: Recent Labs  Lab 11/01/21 0801 11/02/21 0332 11/03/21 0402 11/04/21 0444 11/04/21 0809  WBC 5.1 6.2 5.8 6.5 6.8  NEUTROABS  --  3.8 3.8 4.3  --   HGB 10.9* 11.4* 11.0* 10.2* 9.9*  HCT 36.2* 37.4* 36.7* 32.5* 32.7*  MCV 91.9 89.5 89.7 88.8 88.9  PLT 287 249 263 238 267   Blood Culture No results found for: SDES, SPECREQUEST, CULT, REPTSTATUS   Physical Exam General: Elderly man, nad, laying flat in bed Heart: RRR Lungs: cta bl Abdomen: soft non-tender  Extremities: No LE edema  Dialysis Access: R IJ TDC   Medications:  ferric gluconate (FERRLECIT) IVPB Stopped (11/04/21 1220)    aspirin EC  81 mg Oral Daily   atorvastatin  80 mg Oral Daily   Chlorhexidine Gluconate Cloth  6 each Topical Q0600   fluticasone furoate-vilanterol  1 puff Inhalation Daily   heparin  5,000 Units Subcutaneous Q8H   isoniazid  300 mg Oral Daily   memantine  5 mg Oral Daily   pantoprazole  40 mg Oral Daily   pyridOXINE  50 mg Oral Daily   umeclidinium bromide  1 puff Inhalation Daily    Dialysis Orders: [place] MWF   4h  67kg  400/800  2/2 bath  TDC  Hep 3000  ESA: Mircera 225 q 2 wks. Last 1/30 VDRA: Calcitriol 2.25 TIW    Assessment/Plan: Chest  pain/dyspnea (reason for admission) -- Hx nonobs CAD, CHF. Seen by cardiology, not planning further evaluation.  Resolved. No c/os this am.  ESRD -  HD MWF. Next HD 2/15 if still here  Hypotension/volume - below dry wt, felt awful on Sat hd due to over-ultrafiltration. Net even 2/13, will aim for UF w/ next HD 2/15 Anemia  - Stable. Hgb 9.9 2/13, iron panel 2/13-iron deficient, started Fe load 3/00 Metabolic bone disease -  Continue home binders/calcitriol if stays in hospital DM -per primary  Debility PT/OT per primary  Dispo - awaiting SNF placement  Gean Quint, MD [place] 11/05/2021, 8:01 AM

## 2021-11-05 NOTE — TOC Transition Note (Addendum)
Transition of Care [facility]) - CM/SW Discharge Note   Patient Details  Name: Anthony [name] MRN: [id number] Date of Birth: Mar 06, 1941  Transition of Care [facility]) CM/SW Contact:  Tresa Endo Phone Number: 11/05/2021, 10:12 AM   Clinical Narrative:    Patient will DC to: [place] Anticipated DC date: 11/05/2021 Family notified: Pt spouse Transport by: Corey Harold   Per MD patient ready for DC to [facility] room 121. RN to call report prior to discharge [phone]). RN, patient, patient's family, and facility notified of DC. Discharge Summary and FL2 sent to facility. DC packet on chart. Ambulance transport requested for patient.   CSW will sign off for now as social work intervention is no longer needed. Please consult [place] again if new needs arise.     Final next level of care: Skilled Nursing Facility Barriers to Discharge: Continued Medical Work up   Patient Goals and CMS Choice Patient states their goals for this hospitalization and ongoing recovery are:: Rehab CMS Medicare.gov Compare Post Acute Care list provided to:: Patient Choice offered to / list presented to : Patient  Discharge Placement                       Discharge Plan and Services In-house Referral: Clinical Social Work   Post Acute Care Choice: [place]                               Social Determinants of Health (SDOH) Interventions     Readmission Risk Interventions Readmission Risk Prevention Plan 04/24/2021  Transportation Screening Complete  PCP or Specialist Appt within 3-5 Days Complete  HRI or [place] Complete  Social Work Consult for [place] Planning/Counseling Complete  Palliative Care Screening Not Applicable  Medication Review Press photographer) Referral to Pharmacy

## 2021-11-05 NOTE — Progress Notes (Addendum)
Pt receives out-pt HD at [facility] on MWF. Pt needs to arrive at 11:50 for 12:10 chair time. Pt's treatment is completed around 4:10. This info was provided to CSW since pt to d/c to snf at d/c. Navigator advised clinic pt is supposed to d/c to [facility] at d/c. Will assist as needed.   Melven Sartorius Renal Navigator [phone]  Addendum at 12:04 pm: Contacted clinic to make them aware that pt will d/c to snf today and will resume care tomorrow. CSW note states transportation arranged with [facility] for HD appts.

## 2021-11-06 ENCOUNTER — Telehealth: Payer: Self-pay

## 2021-11-06 NOTE — Telephone Encounter (Signed)
Per wife patient is in SNF and they are actively trying to find permanent placement

## 2021-11-07 ENCOUNTER — Ambulatory Visit: Payer: Medicare Other | Admitting: Licensed Clinical Social Worker

## 2021-11-07 DIAGNOSIS — F0390 Unspecified dementia without behavioral disturbance: Secondary | ICD-10-CM

## 2021-11-07 DIAGNOSIS — I2510 Atherosclerotic heart disease of native coronary artery without angina pectoris: Secondary | ICD-10-CM

## 2021-11-07 DIAGNOSIS — N186 End stage renal disease: Secondary | ICD-10-CM

## 2021-11-07 DIAGNOSIS — J441 Chronic obstructive pulmonary disease with (acute) exacerbation: Secondary | ICD-10-CM

## 2021-11-07 DIAGNOSIS — I5032 Chronic diastolic (congestive) heart failure: Secondary | ICD-10-CM

## 2021-11-07 DIAGNOSIS — I480 Paroxysmal atrial fibrillation: Secondary | ICD-10-CM

## 2021-11-07 DIAGNOSIS — G4700 Insomnia, unspecified: Secondary | ICD-10-CM

## 2021-11-07 DIAGNOSIS — I10 Essential (primary) hypertension: Secondary | ICD-10-CM

## 2021-11-11 ENCOUNTER — Telehealth: Payer: Self-pay

## 2021-11-11 ENCOUNTER — Other Ambulatory Visit: Payer: Self-pay

## 2021-11-11 NOTE — Telephone Encounter (Signed)
Received a return call Wylaundria from [facility] regarding referral received from Dr. Justin Mend- rescheduled patient for right upper arm arteriovenous graft on 11/26/21 at 0700 AM- the earliest arrival time possible per facility. Will fax instructions letter to [phone].

## 2021-11-15 NOTE — Chronic Care Management (AMB) (Signed)
Chronic Care Management    Clinical Social Work Note  11/15/2021 Name: Anthony [name] MRN: [id number] DOB: 1940/10/13  Anthony [name] is a 81 y.o. year old male who is a primary care patient of Anthony [name], Anthony Halsted, MD. The CCM team was consulted to assist the patient with chronic disease management and/or care coordination needs related to: Level of Care Concerns and Mental Health Counseling and Resources.   Engaged with patient's spouse by telephone for follow up visit in response to provider referral for social work chronic care management and care coordination services.   Consent to Services:  The patient was given information about Chronic Care Management services, agreed to services, and gave verbal consent prior to initiation of services.  Please see initial visit note for detailed documentation.   Patient agreed to services and consent obtained.   Assessment: Review of patient past medical history, allergies, medications, and health status, including review of relevant consultants reports was performed today as part of a comprehensive evaluation and provision of chronic care management and care coordination services.     SDOH (Social Determinants of Health) assessments and interventions performed:    Advanced Directives Status: Not addressed in this encounter.  CCM Care Plan  No Known Allergies  Outpatient Encounter Medications as of 11/07/2021  Medication Sig Note   albuterol (PROVENTIL HFA) 108 (90 Base) MCG/ACT inhaler Inhale 2 puffs into the lungs every 4 (four) hours as needed for wheezing or shortness of breath.    aspirin EC 81 MG tablet Take 81 mg by mouth every morning. Swallow whole.    atorvastatin (LIPITOR) 20 MG tablet TAKE 1 TABLET BY MOUTH EVERYDAY AT BEDTIME (Patient not taking: Reported on 11/02/2021)    budesonide-formoterol (SYMBICORT) 160-4.5 MCG/ACT inhaler Inhale 2 puffs into the lungs daily.    calcium acetate (PHOSLO) 667 MG capsule Take 667 mg by  mouth 3 (three) times daily after meals. 11/02/2021: Per wife- dialysis informed to stop taking for a while   carboxymethylcellulose (REFRESH PLUS) 0.5 % SOLN Place 1 drop into both eyes daily as needed (dry eyes).    carvedilol (COREG) 6.25 MG tablet Take 1 tablet (6.25 mg total) by mouth 2 (two) times daily with a meal.    isoniazid (NYDRAZID) 300 MG tablet TAKE 1 TABLET BY MOUTH EVERY DAY (Patient taking differently: Take 300 mg by mouth every morning.)    lidocaine-prilocaine (EMLA) cream Apply 1 application topically daily as needed (port access).    memantine (NAMENDA) 5 MG tablet Take 5 mg by mouth daily.    nicotine (NICODERM CQ - DOSED IN MG/24 HOURS) 14 mg/24hr patch Place 1 patch (14 mg total) onto the skin daily.    pantoprazole (PROTONIX) 40 MG tablet Take 1 tablet (40 mg total) by mouth daily.    pyridOXINE (VITAMIN B-6) 50 MG tablet Take 1 tablet (50 mg total) by mouth daily.    Tiotropium Bromide Monohydrate (SPIRIVA RESPIMAT) 2.5 MCG/ACT AERS Inhale 2 puffs into the lungs daily.    traZODone (DESYREL) 50 MG tablet Take 1-2 tablets (50-100 mg total) by mouth at bedtime as needed. (Patient taking differently: Take 50-100 mg by mouth at bedtime.)    No facility-administered encounter medications on file as of 11/07/2021.    Patient Active Problem List   Diagnosis Date Noted   Vaccine counseling 11/04/2021   Hyperphosphatemia 11/02/2021   Dyspnea 11/01/2021   Hypokalemia 11/01/2021   Leg pain 11/01/2021   Positive QuantiFERON-TB Gold test 05/16/2021   TB lung, latent  05/16/2021   Mitral regurgitation 05/16/2021   Malnutrition of moderate degree 04/18/2021   Anemia 04/16/2021   Symptomatic anemia 04/16/2021   COPD (chronic obstructive pulmonary disease) ([place]) 04/16/2021   CAD (coronary artery disease) 04/16/2021   HLD (hyperlipidemia) 04/16/2021   Prolonged QT interval 04/16/2021   Abnormal ECG 04/16/2021   Dementia without behavioral disturbance ([place]) 04/16/2021   COPD  with acute exacerbation ([place]) 04/16/2021   Occult blood positive stool 04/16/2021   COPD exacerbation ([place]) 03/01/2020   Tobacco use disorder 03/01/2020   Chronic diastolic heart failure (HCC)    PAF (paroxysmal atrial fibrillation) (HCC)    Anemia of chronic disease 02/29/2020   Hypertensive urgency 02/29/2020   Elevated troponin 02/29/2020   Chest pain 02/29/2020   ESRD (end stage renal disease) on dialysis ([place]) 12/15/2019   HTN (hypertension) 12/15/2019   Insomnia     Conditions to be addressed/monitored: CHF, CAD, HTN, COPD, ESRD, and Dementia  Care Plan : LCSW Plan of Care  Updates made by Rebekah Chesterfield, LCSW since 11/15/2021 12:00 AM     Problem: Quality of Life (General Plan of Care)      Goal: Quality of Life Maintained   Start Date: 10/17/2021  This Visit's Progress: On track  Recent Progress: On track  Priority: High  Note:   Current barriers:   Patient's health has declined and longer able to meet ADL's at home.  Acknowledges deficits with placement process and needs support in order to meet this need Currently unable to  independently self manage needs related to chronic health conditions.  Clinical Goal(s): Over the next 30 to 45 days patient will be placed in a facility, patient and family will work with LCSW, to coordinate needs for long term skilled nursing facility  Placement Clinical Interventions: 1:1 collaboration with primary care provider regarding development and update of comprehensive plan of care as evidenced by provider attestation and co-signature Inter-disciplinary care team collaboration (see longitudinal plan of care) Assessed needs and reviewed facility placement process; including applying for Medicaid Patient is a veteran and family are interested in resources that may assist. LCSW provided them with contact info for local [place] office. Pt's spouse agreed to follow up to obtain additional information 2/2: Patient's brother, Anthony [name], shared he  and spouse would like pt to be placed at [facility] LCSW spoke with Arbie Cookey who agreed to email application. LCSW sent application pt's spouse and brother to complete 2/16: Pt's spouse submitted application to [facility] on 11/04/21. She was provided the Medicaid application today from current SNF Patient's spouse and patient's brother provided hx during visit. LCSW informed family of completion and fax to [place] at preferred facility 2/16: LCSW spoke with pt's brother, Anthony [name], who reported he has spoken to nurse at [facility] about his wishes for pt to be transferred to [facility] upon discharge Provided a list of facilities and educational information "when a loved one needs Long-term Care" Obtained patient's verbal permission to fax [place] with primary care provider. Patient's family requested prescription for shower chair, home ramp, walker, and wheelchair care guide referral Collaborate with identified facilities and assist patient as needed to understand facility selection process Assist patient with faxing FL2 to identified facilities once completed and signed by PCP CCM LCSW strongly encouraged spouse to practice self-care to promote relaxation and management of her health conditions Active listening / Reflection utilized  Emotional Support Provided Provided psychoeducation for mental health needs  Caregiver stress acknowledged. Family  receives support from adult granddaughter, who resides in home  Verbalization of feelings encouraged  Discussed caregiver resources and support Patient Goals/Self-Care Activities: Over the next 90 days Attend scheduled appts Utilize healthy coping skills and/or supportive resources discussed Select a facilities from the list provided  Call to check on availability and visit facilities Call me once you have selected a facility and I will assist with sending a copy of the [place], MSW, [email or web address] Phone [phone] 7:01 AM

## 2021-11-15 NOTE — Patient Instructions (Signed)
Visit Information  Thank you for taking time to visit with me today. Please don't hesitate to contact me if I can be of assistance to you before our next scheduled telephone appointment.  Following are the goals we discussed today:  Patient Goals/Self-Care Activities: Over the next 90 days Attend scheduled appts Utilize healthy coping skills and/or supportive resources discussed Select a facilities from the list provided  Call to check on availability and visit facilities Call me once you have selected a facility and I will assist with sending a copy of the [place] next appointment is by telephone on 11/28/21 at 10:00 AM  Please call the care guide team at [phone] if you need to cancel or reschedule your appointment.   If you are experiencing a Mental Health or [place] or need someone to talk to, please call the [place] National Suicide Prevention Lifeline: 234-626-8282 or TTY: [phone] TTY [phone]) to talk to a trained counselor call 911   The patient verbalized understanding of instructions, educational materials, and care plan provided today and declined offer to receive copy of patient instructions, educational materials, and care plan.   Christa See, MSW, [email or web address] Phone [phone] 7:03 AM

## 2021-11-22 ENCOUNTER — Ambulatory Visit: Payer: Self-pay | Admitting: Licensed Clinical Social Worker

## 2021-11-22 DIAGNOSIS — J449 Chronic obstructive pulmonary disease, unspecified: Secondary | ICD-10-CM

## 2021-11-22 DIAGNOSIS — F0390 Unspecified dementia without behavioral disturbance: Secondary | ICD-10-CM

## 2021-11-22 DIAGNOSIS — I10 Essential (primary) hypertension: Secondary | ICD-10-CM

## 2021-11-25 ENCOUNTER — Encounter (HOSPITAL_COMMUNITY): Payer: Self-pay | Admitting: Vascular Surgery

## 2021-11-25 ENCOUNTER — Other Ambulatory Visit: Payer: Self-pay

## 2021-11-25 NOTE — Progress Notes (Signed)
Patient resides at [facility] and [place] [phone]), phone number [phone].  Spoke with Roetta Sessions for PAT information and instructions for DOS.  Instructions were faxed to [phone].  ? ?DUE TO COVID-19 ONLY ONE VISITOR IS ALLOWED TO COME WITH YOU AND STAY IN THE WAITING ROOM ONLY DURING PRE OP AND PROCEDURE DAY OF SURGERY.  ? ?[facility] - Dr Fabienne Bruns ?PCP - Dr Isaac Bliss ?Cardiologist - n/a ?Infectious Diseases - Dr Tommy Medal ? ?Chest x-ray - 11/04/21 (1V) ?EKG - 10/30/21 ?Stress Test - 12/28/20 ?ECHO - 10/31/21 ?Cardiac Cath - n/a ? ?ICD Pacemaker/Loop - n/a ? ?Sleep Study -  n/a ?CPAP - none ? ?Anesthesia review: Yes ? ?STOP now taking any Aspirin (unless otherwise instructed by your surgeon), Aleve, Naproxen, Ibuprofen, Motrin, Advil, Goody's, BC's, all herbal medications, fish oil, and all vitamins.  ? ?Coronavirus Screening ?Covid test n/a Ambulatory Surgery  ?Does the patient have any of the following symptoms:  ?Cough yes/no: No ?Fever (>100.26F)  yes/no: No ?Runny nose yes/no: No ?Sore throat yes/no: No ?Difficulty breathing/shortness of breath  yes/no: No ? ?Has the patient traveled in the last 14 days and where? yes/no: No ? ?Instructions were faxed and review via phone ? ?[place] verbalized understanding of instructions that were given via phone. Instructions were also faxed to [phone]. ?

## 2021-11-25 NOTE — Progress Notes (Signed)
Patient is cancelling his 11/26/21 surgery due to lack of transportation.  OR desk notified.  Patient to call MD's office to inform them of surgery cancellation. ?

## 2021-11-25 NOTE — Progress Notes (Signed)
Anesthesia Chart Review:  Case: [id number] Date/Time: 11/26/21 0845   Procedure: INSERTION OF RIGHT UPPER ARM ARTERIOVENOUS (AV) GORE-TEX GRAFT (Right)   Anesthesia type: Choice   Pre-op diagnosis: ESRD   Location: [facility] OR ROOM 11 / [place] [facility]   Surgeons: Angelia Mould, MD       DISCUSSION: Patient is a 81 year old male scheduled for the above procedure. He is a resident at [facility].  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 Tommy Medal), alcohol abuse (quit 2021), anemia, GI bleed, ESRD.  Mild ectasia abdominal aorta, mild aneurysmal dilatation of distal left CIA 19 mm and right CIA 17 mm 03/16/21 CT at [facility].  [place] admission for chest pain 10/30/21-11/05/21. Troponin was flat at 146 and trended down to 125.  EKG was showing some inverted T waves that were similar to priors. Cardiology felt that his chest pain was not indicative of ACS given that he had a cardiac catheterization in June that showed nonobstructive coronary disease. Dr. Stanford Breed did not recommend ischemic evaluation at that time. Nephrology managed hemodialysis.   He is a same-day work-up, anesthesia team to evaluate on the day of surgery.   VS: Ht _0  (1.702 m)    Wt 66.9 kg    BMI 23.09 kg/m  BP Readings from Last 3 Encounters:  11/05/21 (!) 147/73  10/12/21 (!) 150/68  10/10/21 140/62   Pulse Readings from Last 3 Encounters:  11/05/21 74  10/12/21 (!) 46  10/10/21 60     PROVIDERS: Isaac Bliss, Rayford Halsted, MD is PCP  - Fransico Him, MD is primary cardiologist.  Last evaluation 10/31/21 by Kirk Ruths, MD for recurrent chest pain. [facility] 02/2021 showed non-obstructive CAD. No further ischemic evaluation recommended at that time.  Edrick Oh, MD is nephrologist   LABS: For day of surgery given ESRD. As of 11/04/21, Cr 12.82, glucose 114, AST 8, ALT < 5, phosphorus 5.5 (down from 6.5), H/H 9.9/32.7, PLT 267.   EGD 04/17/21: IMPRESSION: - Small hiatal hernia. - Two non-bleeding angiodysplastic lesions in the duodenum. Treated with argon beam coagulation. - Three non-bleeding angiodysplastic lesions in the duodenum. Treated with argon plasma coagulation (APC). - No specimens collected.   IMAGES: 1V CXR 11/03/21: FINDINGS: 0517 hours. The cardio pericardial silhouette is enlarged. Similar streaky opacities the bases compatible with atelectasis or scarring. No edema or focal airspace consolidation. Right dialysis catheter remains in place per Telemetry leads overlie the chest. IMPRESSION: Stable exam. No acute cardiopulmonary findings.  MRI/MRA Head 09/30/21: IMPRESSION: MRI HEAD IMPRESSION: 1. Motion degraded exam. 2. No acute intracranial abnormality. 3. Age-related cerebral atrophy with moderate chronic small vessel ischemic disease, with superimposed remote lacunar infarct at the anterior right basal ganglia.   MRA HEAD IMPRESSION: 1. Motion degraded exam. 2. Grossly negative intracranial MRA. No large vessel occlusion, hemodynamically significant stenosis, or other acute vascular abnormality.    EKG: 10/30/21:  Sinus or ectopic atrial rhythm Consider left atrial enlargement Left anterior fascicular block Abnormal R-wave progression, early transition Repol abnrm, prob ischemia, anterolateral lds Prolonged QT interval (QT 480, QTc 507 ms) Confirmed by Regan Lemming (  [id number]) on 10/30/2021 8:16:04 PM   CV: Echo 10/31/21: IMPRESSIONS   1. Left ventricular ejection fraction, by estimation, is 50 to 55%. The  left ventricle has low normal  function. The left ventricle has no regional  wall motion abnormalities. There is moderate concentric left ventricular  hypertrophy. Left ventricular  diastolic parameters are consistent with Grade I diastolic dysfunction  (impaired relaxation). Elevated left ventricular end-diastolic pressure.   2. Right ventricular systolic function is normal. The right ventricular  size is normal. Tricuspid regurgitation signal is inadequate for assessing  PA pressure.   3. Left atrial size was moderately dilated.   4. Severe MAC with protrusion of calcification beyond annulus and into  left atrium. The mitral valve is grossly normal. Trivial mitral valve  regurgitation. Severe mitral annular calcification.   5. The aortic valve is grossly normal. There is moderate calcification of  the aortic valve. Aortic valve regurgitation is not visualized. Aortic  valve sclerosis/calcification is present, without any evidence of aortic  stenosis.   6. The inferior vena cava is normal in size with greater than 50%  respiratory variability, suggesting right atrial pressure of 3 mmHg.  - Comparison(s): No significant change from prior study.    Cardiac cath 03/04/21 ([facility]): HEMODYNAMIC DATA: 1. Aortic blood pressure is 97/60 mmHg with a mean of 74 mmHg.  ANGIOGRAPHIC DATA: 1. Left main coronary artery: Large caliber long vessel. Angiographically no significant occlusive disease noted.   2. Left anterior descending coronary artery (LAD): Large caliber vessel reaches the apex. Ostial 50-60% concentric stenosis. DFR of the distal LAD was not significant flow limiting at 0.90.   3. Left circumflex coronary artery (LCx): Large caliber vessel. Angiographically no significant occlusive disease noted.   4. Right coronary artery (RCA): Large caliber dominant vessel. RCA has a 60% proximal stenosis with post stenotic aneurysm. RPDA is a large caliber vessel with a proximal 50% stenosis.   IMPRESSION: 1. Ostial  LAD has a lesions with a DFR of 0.90 suggestive that is not significant flow limiting.  2. Proximal PDA 50% lesion.   RECOMMENDATIONS: 1. Continue aggressive risk factor modifications.    Dobutamine Stress Echo 12/28/20 ([facility]): Conclusion  Abnormal Dobutamine stress echocardiogram.  --Baseline mild LV systolic dysfunction with possible lack of augmentation of  the septum.  --Please consider selective coronary angiography as clinically necessary.    Past Medical History:  Diagnosis Date   Alcohol abuse 2021   Anemia    Arthritis    CAD (coronary artery disease)    CHF (congestive heart failure) ([place])    Chicken pox    COVID 02/2021   Dementia (HCC)    mild   ESRD (end stage renal disease) on dialysis ([place])    GI bleed    History of blood transfusion 03/2021   Hypertension    Insomnia    Mitral regurgitation 05/16/2021   PAF (paroxysmal atrial fibrillation) (HCC)    Pneumonia    Positive QuantiFERON-TB Gold test 05/16/2021   Stroke ([place])    TB lung, latent 05/16/2021   Vaccine counseling 11/04/2021    Past Surgical History:  Procedure Laterality Date   AV FISTULA PLACEMENT Right 05/14/2021   Procedure: INSERTION OF ARTERIOVENOUS (AV) GORE-TEX GRAFT ARM;  Surgeon: Angelia Mould, MD;  Location: [facility] OR;  Service: Vascular;  Laterality: Right;   ENTEROSCOPY N/A 04/18/2021   Procedure: ENTEROSCOPY;  Surgeon: Jackquline Denmark, MD;  Location: [facility] ENDOSCOPY;  Service: Endoscopy;  Laterality: N/A;   HOT  HEMOSTASIS N/A 04/18/2021   Procedure: HOT HEMOSTASIS (ARGON PLASMA COAGULATION/BICAP);  Surgeon: Jackquline Denmark, MD;  Location: [facility] ENDOSCOPY;  Service: Endoscopy;  Laterality: N/A;   SUBMUCOSAL TATTOO INJECTION  04/18/2021   Procedure: SUBMUCOSAL TATTOO INJECTION;  Surgeon: Jackquline Denmark, MD;  Location: [facility] ENDOSCOPY;  Service: Endoscopy;;    MEDICATIONS: No current facility-administered medications for this encounter.    albuterol (PROVENTIL HFA) 108 (90 Base) MCG/ACT  inhaler   aspirin EC 81 MG tablet   atorvastatin (LIPITOR) 20 MG tablet   budesonide-formoterol (SYMBICORT) 160-4.5 MCG/ACT inhaler   calcium acetate (PHOSLO) 667 MG capsule   carboxymethylcellulose (REFRESH PLUS) 0.5 % SOLN   carvedilol (COREG) 6.25 MG tablet   gabapentin (NEURONTIN) 100 MG capsule   isoniazid (NYDRAZID) 300 MG tablet   memantine (NAMENDA) 5 MG tablet   pantoprazole (PROTONIX) 40 MG tablet   pyridOXINE (VITAMIN B-6) 50 MG tablet   Tiotropium Bromide Monohydrate (SPIRIVA RESPIMAT) 2.5 MCG/ACT AERS   nicotine (NICODERM CQ - DOSED IN MG/24 HOURS) 14 mg/24hr patch   traZODone (DESYREL) 50 MG tablet    Myra Gianotti, PA-C Surgical Short Stay/Anesthesiology [facility] Phone [phone] [facility] Phone [phone] 11/25/2021 1:34 PM

## 2021-11-25 NOTE — Anesthesia Preprocedure Evaluation (Addendum)
Anesthesia Evaluation  ?Patient identified by MRN, date of birth, ID band ?Patient awake ? ? ? ?Reviewed: ?Allergy & Precautions, NPO status , Patient's Chart, lab work & pertinent test results ? ?Airway ?Mallampati: II ? ?TM Distance: >3 FB ?Neck ROM: Full ? ? ? Dental ? ?(+) Poor Dentition ?  ?Pulmonary ?COPD, Current Smoker and Patient abstained from smoking.,  ?  ?Pulmonary exam normal ? ? ? ? ? ? ? Cardiovascular ?hypertension, Pt. on medications and Pt. on home beta blockers ?+ CAD and +CHF  ?+ dysrhythmias Atrial Fibrillation + Valvular Problems/Murmurs MR  ?Rhythm:Regular Rate:Normal ? ?Echo 10/31/21: ?IMPRESSIONS  ??1. Left ventricular ejection fraction, by estimation, is 50 to 55%. The  ?left ventricle has low normal function. The left ventricle has no regional  ?wall motion abnormalities. There is moderate concentric left ventricular  ?hypertrophy. Left ventricular  ?diastolic parameters are consistent with Grade I diastolic dysfunction  ?(impaired relaxation). Elevated left ventricular end-diastolic pressure.  ??2. Right ventricular systolic function is normal. The right ventricular  ?size is normal. Tricuspid regurgitation signal is inadequate for assessing  ?PA pressure.  ??3. Left atrial size was moderately dilated.  ??4. Severe MAC with protrusion of calcification beyond annulus and into  ?left atrium. The mitral valve is grossly normal. Trivial mitral valve  ?regurgitation. Severe mitral annular calcification.  ??5. The aortic valve is grossly normal. There is moderate calcification of  ?the aortic valve. Aortic valve regurgitation is not visualized. Aortic  ?valve sclerosis/calcification is present, without any evidence of aortic  ?stenosis.  ??6. The inferior vena cava is normal in size with greater than 50%  ?respiratory variability, suggesting right atrial pressure of 3 mmHg.  ?- Comparison(s): No significant change from prior study.  ? ? ?Cardiac cath 03/04/21  ([facility]): ?IMPRESSION: ?1. Ostial LAD has a lesions with a DFR of 0.90 suggestive that is not significant flow limiting.  ?2. Proximal PDA 50% lesion.  ?RECOMMENDATIONS: ?1. Continue aggressive risk factor modifications.? ? ?  ?Neuro/Psych ?Dementia CVA, Residual Symptoms   ? GI/Hepatic ?GERD  Medicated,(+)  ?  ? substance abuse ? alcohol use, Latent TB ?  ?Endo/Other  ?negative endocrine ROS ? Renal/GU ?ESRF and DialysisRenal disease  ?negative genitourinary ?  ?Musculoskeletal ? ?(+) Arthritis , Osteoarthritis,   ? Abdominal ?Normal abdominal exam  (+)   ?Peds ? Hematology ? ?(+) Blood dyscrasia, anemia ,   ?Anesthesia Other Findings ? ? Reproductive/Obstetrics ? ?  ? ? ? ? ? ? ? ? ? ? ? ? ? ?  ?  ? ? ? ? ? ? ?Anesthesia Physical ?Anesthesia Plan ? ?ASA: 4 ? ?Anesthesia Plan: MAC and Regional  ? ?Post-op Pain Management: Regional block*  ? ?Induction: Intravenous ? ?PONV Risk Score and Plan: Propofol infusion, Treatment may vary due to age or medical condition, Ondansetron and Dexamethasone ? ?Airway Management Planned: Simple Face Mask, Natural Airway and Nasal Cannula ? ?Additional Equipment: None ? ?Intra-op Plan:  ? ?Post-operative Plan:  ? ?Informed Consent: I have reviewed the patients History and Physical, chart, labs and discussed the procedure including the risks, benefits and alternatives for the proposed anesthesia with the patient or authorized representative who has indicated his/her understanding and acceptance.  ? ? ? ?Dental advisory given ? ?Plan Discussed with: CRNA ? ?Anesthesia Plan Comments: (DOS note: K 2.8, will replete with multiple runs, BG 59, will give 1/2 amp ?Lab Results ?     Component  Value               Date                 ?     WBC                      6.8                 11/04/2021           ?     HGB                      7.1 (L)             11/26/2021           ?     HCT                      21.0 (L)            11/26/2021           ?     MCV                       88.9                11/04/2021           ?     PLT                      267                 11/04/2021           ?Lab Results ?     Component                Value               Date                 ?     NA                       142                 11/26/2021           ?     K                        2.8 (L)             11/26/2021           ?     CO2                      24                  11/04/2021           ?     GLUCOSE                  59 (L)              11/26/2021           ?     BUN                      34 (H)  11/26/2021           ?     CREATININE               5.50 (H)            11/26/2021           ?     CALCIUM                  8.5 (L)             11/04/2021           ?     GFRNONAA                 4 (L)               11/04/2021           ?PAT note written 11/25/2021 by Myra Gianotti, PA-C. ?History includes smoking (no smoking since SNF admission), HTN, CAD (50-60% ostial LAD lesion with DFR of 0.90 suggesting that it is not significant flow limiting, proximal PDA 50% lesion, RCA 60% proximal stenosis with post stenotic aneurysm, medical management 02/2021), chronic diastolic CHF, mitral regurgitation (severe MAC, trivial MR 10/31/21), PAF (not on anticoagulation due to recurrent GI bleed), CVA, dementia, latent TB (05/16/21, treated with isoniazid per ID Dr. Tommy Medal), alcohol abuse (quit 2021), anemia, GI bleed, ESRD.  ?ECHO 02/23: ? ?IMPRESSIONS  ??1. Left ventricular ejection fraction, by estimation, is 50 to 55%. The  ?left ventricle has low normal function. The left ventricle has no regional  ?wall motion abnormalities. There is moderate concentric left ventricular  ?hypertrophy. Left ventricular  ?diastolic parameters are consistent with Grade I diastolic dysfunction  ?(impaired relaxation). Elevated left ventricular end-diastolic pressure.  ??2. Right ventricular systolic function is normal. The right ventricular  ?size is normal. Tricuspid regurgitation signal is inadequate for  assessing  ?PA pressure.  ??3. Left atrial size was moderately dilated.  ??4. Severe MAC with protrusion of calcification beyond annulus and into  ?left atrium. The mitral valve is grossly normal. Trivial mitral valve  ?regurgitation. Severe mitral annular calcification.  ??5. The aortic valve is grossly normal. There is moderate calcification of  ?the aortic valve. Aortic valve regurgitation is not visualized. Aortic  ?valve sclerosis/calcification is present, without any evidence of aortic  ?stenosis.  ??6. The inferior vena cava is normal in size with greater than 50%  ?respiratory variability, suggesting right atrial pressure of 3 mmHg.  ?- Comparison(s): No significant change from prior study.  ??)  ? ? ? ? ?Anesthesia Quick Evaluation ? ?

## 2021-11-25 NOTE — Progress Notes (Signed)
Surgical Instructions for Anthony [name] ? ? ? Your procedure is scheduled on Tuesday, November 26, 2021.. ? Report to [facility] Main Entrance "A" at 6:30 A.M., then check in with the Admitting office. ? Call this number if you have problems the morning of surgery: ? [phone] ? ? If you have any questions prior to your surgery date call [phone]: Open Monday-Friday 8am-4pm ? ? ? Remember: ? Do not eat or drink after midnight tonight-Monday (11/25/21). ? ?  ? Take these medicines the morning of surgery with A SIP OF WATER:  ?albuterol (PROVENTIL HFA) inhaler if needed ?aspirin EC 81 MG tablet ?budesonide-formoterol (SYMBICORT) inhaler ?carvedilol (COREG) 6.25 MG tablet ?isoniazid (NYDRAZID)  ?pantoprazole (PROTONIX) 40 MG tablet ?Tiotropium Bromide Monohydrate (SPIRIVA RESPIMAT) 2.5 MCG/ACT AERS INHALER ? ? ?As of today, STOP taking any Aleve, Naproxen, Ibuprofen, Motrin, Advil, Goody's, BC's, all herbal medications, fish oil, and all vitamins. ? ?         ?Do not wear jewelry  ?Do not wear lotions, powders, colognes, or deodorant. ?Men may shave face and neck. ?Do not bring valuables to the hospital. ? ? ?[facility] is not responsible for any belongings or valuables. .  ? ?Do NOT Smoke (Tobacco/Vaping)  24 hours prior to your procedure ? ?If you use a CPAP at night, you may bring your mask for your overnight stay. ?  ?Contacts, glasses, hearing aids, dentures or partials may not be worn into surgery, please bring cases for these belongings ?  ?For patients admitted to the hospital, discharge time will be determined by your treatment team. ?  ?Patients discharged the day of surgery will not be allowed to drive home, and someone needs to stay with them for 24 hours. ? ?NO VISITORS WILL BE ALLOWED IN PRE-OP WHERE PATIENTS ARE PREPPED FOR SURGERY.  ONLY 1 SUPPORT PERSON MAY BE PRESENT IN THE WAITING ROOM WHILE YOU ARE IN SURGERY.  IF YOU ARE TO BE ADMITTED, ONCE YOU ARE IN YOUR ROOM YOU WILL BE ALLOWED TWO (2)  VISITORS. 1 (ONE) VISITOR MAY STAY OVERNIGHT BUT MUST ARRIVE TO THE ROOM BY 8pm.   ? ?Special instructions:   ? ?Oral Hygiene is also important to reduce your risk of infection.  Remember - BRUSH YOUR TEETH THE MORNING OF SURGERY WITH YOUR REGULAR TOOTHPASTE ?   ?

## 2021-11-26 ENCOUNTER — Other Ambulatory Visit: Payer: Self-pay

## 2021-11-26 ENCOUNTER — Ambulatory Visit (HOSPITAL_COMMUNITY): Payer: Medicare Other | Admitting: Vascular Surgery

## 2021-11-26 ENCOUNTER — Encounter (HOSPITAL_COMMUNITY): Payer: Self-pay | Admitting: Vascular Surgery

## 2021-11-26 ENCOUNTER — Encounter (HOSPITAL_COMMUNITY)
Admission: RE | Disposition: A | Discharge status: Skilled Nursing Facility | Payer: Self-pay | Source: Home / Self Care | Attending: Internal Medicine

## 2021-11-26 ENCOUNTER — Inpatient Hospital Stay (HOSPITAL_COMMUNITY)
Admission: RE | Admit: 2021-11-26 | Discharge: 2021-12-01 | DRG: 264 | Disposition: A | Discharge status: Skilled Nursing Facility | Payer: Medicare Other | Attending: Internal Medicine | Admitting: Internal Medicine

## 2021-11-26 ENCOUNTER — Inpatient Hospital Stay (HOSPITAL_COMMUNITY): Payer: Medicare Other

## 2021-11-26 DIAGNOSIS — I509 Heart failure, unspecified: Secondary | ICD-10-CM

## 2021-11-26 DIAGNOSIS — Z79899 Other long term (current) drug therapy: Secondary | ICD-10-CM

## 2021-11-26 DIAGNOSIS — Z992 Dependence on renal dialysis: Secondary | ICD-10-CM | POA: Diagnosis not present

## 2021-11-26 DIAGNOSIS — G9340 Encephalopathy, unspecified: Secondary | ICD-10-CM | POA: Diagnosis present

## 2021-11-26 DIAGNOSIS — M79672 Pain in left foot: Secondary | ICD-10-CM

## 2021-11-26 DIAGNOSIS — I70229 Atherosclerosis of native arteries of extremities with rest pain, unspecified extremity: Secondary | ICD-10-CM | POA: Diagnosis present

## 2021-11-26 DIAGNOSIS — I70223 Atherosclerosis of native arteries of extremities with rest pain, bilateral legs: Secondary | ICD-10-CM | POA: Diagnosis not present

## 2021-11-26 DIAGNOSIS — R0989 Other specified symptoms and signs involving the circulatory and respiratory systems: Secondary | ICD-10-CM

## 2021-11-26 DIAGNOSIS — I5032 Chronic diastolic (congestive) heart failure: Secondary | ICD-10-CM | POA: Diagnosis present

## 2021-11-26 DIAGNOSIS — Z227 Latent tuberculosis: Secondary | ICD-10-CM | POA: Diagnosis not present

## 2021-11-26 DIAGNOSIS — I132 Hypertensive heart and chronic kidney disease with heart failure and with stage 5 chronic kidney disease, or end stage renal disease: Secondary | ICD-10-CM | POA: Diagnosis present

## 2021-11-26 DIAGNOSIS — N186 End stage renal disease: Secondary | ICD-10-CM

## 2021-11-26 DIAGNOSIS — Z7951 Long term (current) use of inhaled steroids: Secondary | ICD-10-CM

## 2021-11-26 DIAGNOSIS — R001 Bradycardia, unspecified: Secondary | ICD-10-CM | POA: Diagnosis not present

## 2021-11-26 DIAGNOSIS — F17210 Nicotine dependence, cigarettes, uncomplicated: Secondary | ICD-10-CM | POA: Diagnosis present

## 2021-11-26 DIAGNOSIS — I9581 Postprocedural hypotension: Secondary | ICD-10-CM | POA: Diagnosis not present

## 2021-11-26 DIAGNOSIS — I480 Paroxysmal atrial fibrillation: Secondary | ICD-10-CM | POA: Diagnosis present

## 2021-11-26 DIAGNOSIS — M898X9 Other specified disorders of bone, unspecified site: Secondary | ICD-10-CM | POA: Diagnosis present

## 2021-11-26 DIAGNOSIS — F0390 Unspecified dementia without behavioral disturbance: Secondary | ICD-10-CM | POA: Diagnosis present

## 2021-11-26 DIAGNOSIS — Z7982 Long term (current) use of aspirin: Secondary | ICD-10-CM

## 2021-11-26 DIAGNOSIS — M1990 Unspecified osteoarthritis, unspecified site: Secondary | ICD-10-CM | POA: Diagnosis present

## 2021-11-26 DIAGNOSIS — Z8673 Personal history of transient ischemic attack (TIA), and cerebral infarction without residual deficits: Secondary | ICD-10-CM

## 2021-11-26 DIAGNOSIS — I959 Hypotension, unspecified: Secondary | ICD-10-CM | POA: Diagnosis not present

## 2021-11-26 DIAGNOSIS — Z20822 Contact with and (suspected) exposure to covid-19: Secondary | ICD-10-CM | POA: Diagnosis not present

## 2021-11-26 DIAGNOSIS — Z8249 Family history of ischemic heart disease and other diseases of the circulatory system: Secondary | ICD-10-CM

## 2021-11-26 DIAGNOSIS — R0902 Hypoxemia: Secondary | ICD-10-CM | POA: Diagnosis not present

## 2021-11-26 DIAGNOSIS — L89151 Pressure ulcer of sacral region, stage 1: Secondary | ICD-10-CM | POA: Diagnosis present

## 2021-11-26 DIAGNOSIS — G4700 Insomnia, unspecified: Secondary | ICD-10-CM | POA: Diagnosis present

## 2021-11-26 DIAGNOSIS — I2510 Atherosclerotic heart disease of native coronary artery without angina pectoris: Secondary | ICD-10-CM

## 2021-11-26 DIAGNOSIS — E162 Hypoglycemia, unspecified: Secondary | ICD-10-CM | POA: Diagnosis not present

## 2021-11-26 DIAGNOSIS — M79671 Pain in right foot: Secondary | ICD-10-CM

## 2021-11-26 DIAGNOSIS — Z79891 Long term (current) use of opiate analgesic: Secondary | ICD-10-CM

## 2021-11-26 DIAGNOSIS — D631 Anemia in chronic kidney disease: Secondary | ICD-10-CM | POA: Diagnosis present

## 2021-11-26 DIAGNOSIS — N185 Chronic kidney disease, stage 5: Secondary | ICD-10-CM | POA: Diagnosis not present

## 2021-11-26 DIAGNOSIS — L8990 Pressure ulcer of unspecified site, unspecified stage: Secondary | ICD-10-CM | POA: Diagnosis present

## 2021-11-26 DIAGNOSIS — Z8616 Personal history of COVID-19: Secondary | ICD-10-CM

## 2021-11-26 HISTORY — PX: AV FISTULA PLACEMENT: SHX1204

## 2021-11-26 LAB — GLUCOSE, CAPILLARY
Glucose-Capillary: 67 mg/dL — ABNORMAL LOW (ref 70–99)
Glucose-Capillary: 78 mg/dL (ref 70–99)
Glucose-Capillary: 63 mg/dL — ABNORMAL LOW (ref 70–99)
Glucose-Capillary: 47 mg/dL — ABNORMAL LOW (ref 70–99)
Glucose-Capillary: 124 mg/dL — ABNORMAL HIGH (ref 70–99)
Glucose-Capillary: 64 mg/dL — ABNORMAL LOW (ref 70–99)
Glucose-Capillary: 82 mg/dL (ref 70–99)
Glucose-Capillary: 84 mg/dL (ref 70–99)
Glucose-Capillary: 109 mg/dL — ABNORMAL HIGH (ref 70–99)

## 2021-11-26 LAB — COMPREHENSIVE METABOLIC PANEL
ALT: 5 U/L (ref 0–44)
AST: 12 U/L — ABNORMAL LOW (ref 15–41)
Albumin: 3.4 g/dL — ABNORMAL LOW (ref 3.5–5.0)
Alkaline Phosphatase: 70 U/L (ref 38–126)
Anion gap: 17 — ABNORMAL HIGH (ref 5–15)
BUN: 52 mg/dL — ABNORMAL HIGH (ref 8–23)
CO2: 21 mmol/L — ABNORMAL LOW (ref 22–32)
Calcium: 8.2 mg/dL — ABNORMAL LOW (ref 8.9–10.3)
Chloride: 95 mmol/L — ABNORMAL LOW (ref 98–111)
Creatinine, Ser: 7.26 mg/dL — ABNORMAL HIGH (ref 0.61–1.24)
GFR, Estimated: 7 mL/min — ABNORMAL LOW (ref >60)
Glucose, Bld: 96 mg/dL (ref 70–99)
Potassium: 4.3 mmol/L (ref 3.5–5.1)
Sodium: 133 mmol/L — ABNORMAL LOW (ref 135–145)
Total Bilirubin: 0.5 mg/dL (ref 0.3–1.2)
Total Protein: 7 g/dL (ref 6.5–8.1)

## 2021-11-26 LAB — CBC
HCT: 28.1 % — ABNORMAL LOW (ref 39.0–52.0)
Hemoglobin: 8.7 g/dL — ABNORMAL LOW (ref 13.0–17.0)
MCH: 27.6 pg (ref 26.0–34.0)
MCHC: 31 g/dL (ref 30.0–36.0)
MCV: 89.2 fL (ref 80.0–100.0)
Platelets: 266 10*3/uL (ref 150–400)
RBC: 3.15 MIL/uL — ABNORMAL LOW (ref 4.22–5.81)
RDW: 20.1 % — ABNORMAL HIGH (ref 11.5–15.5)
WBC: 11.7 10*3/uL — ABNORMAL HIGH (ref 4.0–10.5)
nRBC: 0 % (ref 0.0–0.2)

## 2021-11-26 LAB — POCT I-STAT, CHEM 8
BUN: 34 mg/dL — ABNORMAL HIGH (ref 8–23)
Calcium, Ion: 0.9 mmol/L — ABNORMAL LOW (ref 1.15–1.40)
Chloride: 106 mmol/L (ref 98–111)
Creatinine, Ser: 5.5 mg/dL — ABNORMAL HIGH (ref 0.61–1.24)
Glucose, Bld: 59 mg/dL — ABNORMAL LOW (ref 70–99)
HCT: 21 % — ABNORMAL LOW (ref 39.0–52.0)
Hemoglobin: 7.1 g/dL — ABNORMAL LOW (ref 13.0–17.0)
Potassium: 2.8 mmol/L — ABNORMAL LOW (ref 3.5–5.1)
Sodium: 142 mmol/L (ref 135–145)
TCO2: 21 mmol/L — ABNORMAL LOW (ref 22–32)

## 2021-11-26 LAB — MRSA NEXT GEN BY PCR, NASAL: MRSA by PCR Next Gen: NOT DETECTED

## 2021-11-26 LAB — TYPE AND SCREEN
ABO/RH(D): O POS
Antibody Screen: NEGATIVE

## 2021-11-26 LAB — LACTIC ACID, PLASMA: Lactic Acid, Venous: 1.7 mmol/L (ref 0.5–1.9)

## 2021-11-26 LAB — MAGNESIUM: Magnesium: 2 mg/dL (ref 1.7–2.4)

## 2021-11-26 SURGERY — INSERTION OF ARTERIOVENOUS (AV) GORE-TEX GRAFT ARM
Anesthesia: Monitor Anesthesia Care | Site: Arm Lower | Laterality: Right

## 2021-11-26 MED ORDER — DOPAMINE-DEXTROSE 3.2-5 MG/ML-% IV SOLN
0.0000 ug/kg/min | INTRAVENOUS | Status: DC
  Administered 2021-11-26: 5 ug/kg/min via INTRAVENOUS

## 2021-11-26 MED ORDER — IPRATROPIUM-ALBUTEROL 0.5-2.5 (3) MG/3ML IN SOLN: 3.0000 mL | RESPIRATORY_TRACT | Status: DC | PRN

## 2021-11-26 MED ORDER — CALCIUM GLUCONATE-NACL 1-0.675 GM/50ML-% IV SOLN
1.0000 g | Freq: Once | INTRAVENOUS | Status: AC
  Administered 2021-11-26: 1000 mg via INTRAVENOUS
  Filled 2021-11-26: qty 50

## 2021-11-26 MED ORDER — LIDOCAINE HCL (PF) 2 % IJ SOLN
INTRAMUSCULAR | Status: DC | PRN
Start: 2021-11-26 — End: 2021-11-26
  Administered 2021-11-26: 400 mg via PERINEURAL

## 2021-11-26 MED ORDER — CHLORHEXIDINE GLUCONATE 4 % EX LIQD: 60.0000 mL | Freq: Once | CUTANEOUS | Status: DC

## 2021-11-26 MED ORDER — POTASSIUM CHLORIDE 10 MEQ/100ML IV SOLN
10.0000 meq | INTRAVENOUS | Status: AC
  Administered 2021-11-26 (×2): 10 meq via INTRAVENOUS
  Filled 2021-11-26: qty 100

## 2021-11-26 MED ORDER — PROTAMINE SULFATE 10 MG/ML IV SOLN
INTRAVENOUS | Status: DC | PRN
  Administered 2021-11-26 (×4): 10 mg via INTRAVENOUS

## 2021-11-26 MED ORDER — LIDOCAINE HCL (PF) 1 % IJ SOLN
INTRAMUSCULAR | Status: AC
  Filled 2021-11-26: qty 30

## 2021-11-26 MED ORDER — MIDAZOLAM HCL 2 MG/2ML IJ SOLN
INTRAMUSCULAR | Status: AC
  Filled 2021-11-26: qty 2

## 2021-11-26 MED ORDER — LIDOCAINE-EPINEPHRINE (PF) 1 %-1:200000 IJ SOLN
INTRAMUSCULAR | Status: DC | PRN
  Administered 2021-11-26: 25 mL

## 2021-11-26 MED ORDER — ISONIAZID 300 MG PO TABS
300.0000 mg | ORAL_TABLET | Freq: Every day | ORAL | Status: DC
  Administered 2021-11-26 – 2021-12-01 (×6): 300 mg via ORAL
  Filled 2021-11-26 (×6): qty 1

## 2021-11-26 MED ORDER — HEPARIN 6000 UNIT IRRIGATION SOLUTION
Status: DC | PRN
  Administered 2021-11-26: 1

## 2021-11-26 MED ORDER — SODIUM CHLORIDE 0.9 % IV SOLN: INTRAVENOUS | Status: DC

## 2021-11-26 MED ORDER — LACTATED RINGERS IV BOLUS
250.0000 mL | Freq: Once | INTRAVENOUS | Status: AC
  Administered 2021-11-26: 250 mL via INTRAVENOUS

## 2021-11-26 MED ORDER — ALBUMIN HUMAN 5 % IV SOLN
INTRAVENOUS | Status: AC
  Filled 2021-11-26: qty 250

## 2021-11-26 MED ORDER — HEPARIN 6000 UNIT IRRIGATION SOLUTION
Status: AC
  Filled 2021-11-26: qty 500

## 2021-11-26 MED ORDER — CARVEDILOL 3.125 MG PO TABS
6.2500 mg | ORAL_TABLET | Freq: Once | ORAL | Status: AC
  Administered 2021-11-26: 6.25 mg via ORAL
  Filled 2021-11-26: qty 2

## 2021-11-26 MED ORDER — PROPOFOL 10 MG/ML IV BOLUS
INTRAVENOUS | Status: AC
  Filled 2021-11-26: qty 20

## 2021-11-26 MED ORDER — POLYETHYLENE GLYCOL 3350 17 G PO PACK: 17.0000 g | PACK | Freq: Every day | ORAL | Status: DC | PRN

## 2021-11-26 MED ORDER — DEXTROSE 50 % IV SOLN
INTRAVENOUS | Status: AC
  Filled 2021-11-26: qty 50

## 2021-11-26 MED ORDER — FENTANYL CITRATE (PF) 250 MCG/5ML IJ SOLN
INTRAMUSCULAR | Status: AC
  Filled 2021-11-26: qty 5

## 2021-11-26 MED ORDER — DEXTROSE 50 % IV SOLN
1.0000 | Freq: Once | INTRAVENOUS | Status: AC
Start: 2021-11-26 — End: 2021-11-26
  Administered 2021-11-26: 50 mL via INTRAVENOUS

## 2021-11-26 MED ORDER — ALBUMIN HUMAN 5 % IV SOLN
12.5000 g | Freq: Once | INTRAVENOUS | Status: AC
Start: 2021-11-26 — End: 2021-11-26
  Administered 2021-11-26: 12.5 g via INTRAVENOUS

## 2021-11-26 MED ORDER — HEPARIN SODIUM (PORCINE) 5000 UNIT/ML IJ SOLN
5000.0000 [IU] | Freq: Three times a day (TID) | INTRAMUSCULAR | Status: DC
  Administered 2021-11-26 – 2021-11-29 (×6): 5000 [IU] via SUBCUTANEOUS
  Filled 2021-11-26 (×6): qty 1

## 2021-11-26 MED ORDER — DEXTROSE 10 % IV SOLN
INTRAVENOUS | Status: DC
  Filled 2021-11-26: qty 1000

## 2021-11-26 MED ORDER — FENTANYL CITRATE (PF) 100 MCG/2ML IJ SOLN
INTRAMUSCULAR | Status: AC
  Administered 2021-11-26: 25 ug via INTRAVENOUS
  Filled 2021-11-26: qty 2

## 2021-11-26 MED ORDER — DEXTROSE 50 % IV SOLN
25.0000 mL | Freq: Once | INTRAVENOUS | Status: AC
  Administered 2021-11-26: 25 mL via INTRAVENOUS

## 2021-11-26 MED ORDER — ALBUMIN HUMAN 5 % IV SOLN
12.5000 g | Freq: Once | INTRAVENOUS | Status: AC
  Administered 2021-11-26: 12.5 g via INTRAVENOUS

## 2021-11-26 MED ORDER — LIDOCAINE-EPINEPHRINE (PF) 1 %-1:200000 IJ SOLN
INTRAMUSCULAR | Status: AC
  Filled 2021-11-26: qty 30

## 2021-11-26 MED ORDER — ORAL CARE MOUTH RINSE: 15.0000 mL | Freq: Once | OROMUCOSAL | Status: AC

## 2021-11-26 MED ORDER — CHLORHEXIDINE GLUCONATE 0.12 % MT SOLN
15.0000 mL | Freq: Once | OROMUCOSAL | Status: AC
  Administered 2021-11-26: 15 mL via OROMUCOSAL
  Filled 2021-11-26: qty 15

## 2021-11-26 MED ORDER — OXYCODONE-ACETAMINOPHEN 5-325 MG PO TABS: 1.0000 | ORAL_TABLET | Freq: Four times a day (QID) | ORAL | 0 refills | Status: AC | PRN

## 2021-11-26 MED ORDER — DOCUSATE SODIUM 100 MG PO CAPS: 100.0000 mg | ORAL_CAPSULE | Freq: Two times a day (BID) | ORAL | Status: DC | PRN

## 2021-11-26 MED ORDER — DEXTROSE 5 % IV SOLN
INTRAVENOUS | Status: DC
  Administered 2021-11-26: 10 mL via INTRAVENOUS

## 2021-11-26 MED ORDER — 0.9 % SODIUM CHLORIDE (POUR BTL) OPTIME
TOPICAL | Status: DC | PRN
  Administered 2021-11-26: 1000 mL

## 2021-11-26 MED ORDER — FENTANYL CITRATE (PF) 100 MCG/2ML IJ SOLN: 25.0000 ug | INTRAMUSCULAR | Status: DC | PRN

## 2021-11-26 MED ORDER — FENTANYL CITRATE (PF) 100 MCG/2ML IJ SOLN: 25.0000 ug | Freq: Once | INTRAMUSCULAR | Status: AC

## 2021-11-26 MED ORDER — HEPARIN SODIUM (PORCINE) 1000 UNIT/ML IJ SOLN
INTRAMUSCULAR | Status: DC | PRN
  Administered 2021-11-26: 7000 [IU] via INTRAVENOUS

## 2021-11-26 MED ORDER — ACETAMINOPHEN 10 MG/ML IV SOLN: 1000.0000 mg | Freq: Once | INTRAVENOUS | Status: DC | PRN

## 2021-11-26 MED ORDER — CEFAZOLIN SODIUM-DEXTROSE 2-4 GM/100ML-% IV SOLN
2.0000 g | INTRAVENOUS | Status: AC
  Administered 2021-11-26: 2 g via INTRAVENOUS
  Filled 2021-11-26: qty 100

## 2021-11-26 MED ORDER — MOMETASONE FURO-FORMOTEROL FUM 200-5 MCG/ACT IN AERO
2.0000 | INHALATION_SPRAY | Freq: Two times a day (BID) | RESPIRATORY_TRACT | Status: DC
  Administered 2021-11-26 – 2021-12-01 (×9): 2 via RESPIRATORY_TRACT
  Filled 2021-11-26: qty 8.8

## 2021-11-26 MED ORDER — THROMBIN (RECOMBINANT) 20000 UNITS EX SOLR
CUTANEOUS | Status: AC
  Filled 2021-11-26: qty 20000

## 2021-11-26 MED ORDER — DOPAMINE-DEXTROSE 3.2-5 MG/ML-% IV SOLN
INTRAVENOUS | Status: AC
  Filled 2021-11-26: qty 250

## 2021-11-26 MED ORDER — DEXTROSE 50 % IV SOLN
INTRAVENOUS | Status: AC
Start: 2021-11-26 — End: 2021-11-27
  Filled 2021-11-26: qty 50

## 2021-11-26 MED ORDER — CHLORHEXIDINE GLUCONATE CLOTH 2 % EX PADS
6.0000 | MEDICATED_PAD | Freq: Every day | CUTANEOUS | Status: DC
  Administered 2021-11-26 – 2021-11-27 (×2): 6 via TOPICAL

## 2021-11-26 MED ORDER — POTASSIUM CHLORIDE 10 MEQ/100ML IV SOLN
10.0000 meq | INTRAVENOUS | Status: AC
  Administered 2021-11-26 (×3): 10 meq via INTRAVENOUS
  Filled 2021-11-26: qty 100

## 2021-11-26 SURGICAL SUPPLY — 34 items
ARMBAND PINK RESTRICT EXTREMIT (MISCELLANEOUS) ×6 IMPLANT
BAG COUNTER SPONGE SURGICOUNT (BAG) ×2 IMPLANT
BAG SURGICOUNT SPONGE COUNTING (BAG) ×1
CANISTER SUCT 3000ML PPV (MISCELLANEOUS) ×3 IMPLANT
CANNULA VESSEL 3MM 2 BLNT TIP (CANNULA) ×3 IMPLANT
CLIP VESOCCLUDE MED 6/CT (CLIP) ×3 IMPLANT
CLIP VESOCCLUDE SM WIDE 6/CT (CLIP) ×3 IMPLANT
DERMABOND ADVANCED (GAUZE/BANDAGES/DRESSINGS) ×2
DERMABOND ADVANCED .7 DNX12 (GAUZE/BANDAGES/DRESSINGS) ×1 IMPLANT
ELECT REM PT RETURN 9FT ADLT (ELECTROSURGICAL) ×3
ELECTRODE REM PT RTRN 9FT ADLT (ELECTROSURGICAL) ×1 IMPLANT
GLOVE SRG 8 PF TXTR STRL LF DI (GLOVE) ×1 IMPLANT
GLOVE SURG ENC MOIS LTX SZ7.5 (GLOVE) ×3 IMPLANT
GLOVE SURG UNDER LTX SZ8 (GLOVE) ×3 IMPLANT
GLOVE SURG UNDER POLY LF SZ8 (GLOVE) ×2
GOWN STRL REUS W/ TWL LRG LVL3 (GOWN DISPOSABLE) ×3 IMPLANT
GOWN STRL REUS W/TWL LRG LVL3 (GOWN DISPOSABLE) ×6
GRAFT GORETEX STRT 4-7X45 (Vascular Products) ×2 IMPLANT
KIT BASIN OR (CUSTOM PROCEDURE TRAY) ×3 IMPLANT
KIT TURNOVER KIT B (KITS) ×3 IMPLANT
NS IRRIG 1000ML POUR BTL (IV SOLUTION) ×3 IMPLANT
PACK CV ACCESS (CUSTOM PROCEDURE TRAY) ×3 IMPLANT
PAD ARMBOARD 7.5X6 YLW CONV (MISCELLANEOUS) ×6 IMPLANT
SLEEVE SURGEON STRL (DRAPES) ×2 IMPLANT
SLING ARM FOAM STRAP MED (SOFTGOODS) ×2 IMPLANT
SUT MNCRL AB 4-0 PS2 18 (SUTURE) ×4 IMPLANT
SUT PROLENE 5 0 C 1 24 (SUTURE) ×2 IMPLANT
SUT PROLENE 6 0 BV (SUTURE) ×10 IMPLANT
SUT VIC AB 3-0 SH 27 (SUTURE) ×2
SUT VIC AB 3-0 SH 27X BRD (SUTURE) ×2 IMPLANT
SYR TOOMEY 50ML (SYRINGE) IMPLANT
TOWEL GREEN STERILE (TOWEL DISPOSABLE) ×3 IMPLANT
UNDERPAD 30X36 HEAVY ABSORB (UNDERPADS AND DIAPERS) ×3 IMPLANT
WATER STERILE IRR 1000ML POUR (IV SOLUTION) ×3 IMPLANT

## 2021-11-26 NOTE — Progress Notes (Signed)
eLink Physician-Brief Progress Note ?Patient Name: Anthony [name] ?DOB: 02-Aug-1941 ?MRN: [id number] ? ? ?Date of Service ? 11/26/2021  ?HPI/Events of Note ? Hypoglycemia - Blood glucose = 124 --> 82 --> 84.  ?eICU Interventions ? Plan: ?Increase D10W IV infusion to 100 mL/hour. ?When blood glucose > 100 X 2 consecutive determinations, can go to Q 4 hour cbg checks.   ? ? ? ?Intervention Category ?Major Interventions: Other: ? ?Anthony [name] ?11/26/2021, 8:27 PM ?

## 2021-11-26 NOTE — Op Note (Signed)
? ? ?  NAME: Anthony [name]    MRN: [id number] ?DOB: 1941/06/15    DATE OF OPERATION: 11/26/2021 ? ?PREOP DIAGNOSIS:   ? ?End-stage renal disease ? ?POSTOP DIAGNOSIS:   ? ?Same ? ?PROCEDURE:  ?  ?New right upper arm AV graft (4-7 mm PTFE graft) ? ?SURGEON: Judeth Cornfield. Scot Dock, MD ? ?ASSIST: Liana Crocker, PA ? ?ANESTHESIA: Axillary block with local ? ?EBL: Minimal ? ?INDICATIONS:  ? ? Anthony [name] is a 81 y.o. male who presents for new access.  He has had a previous right forearm graft.  He had no options for a fistula. ? ?FINDINGS:  ? ?4 mm brachial artery.  4-1/2 mm axillary vein. ? ?TECHNIQUE:  ? ?The patient was taken to the operating room after an axillary block was placed by anesthesia.  The right upper extremity was prepped and draped in usual sterile fashion.  After the skin was infiltrated with 1% lidocaine a longitudinal incision was made just above the antecubital level.  Here the brachial artery was dissected free and controlled with a vessel loop.  A separate incision was made beneath the axilla after the skin was anesthetized.  The high axillary vein was dissected free.  A 4-7 mm graft was tunneled between the 2 incisions after the skin was anesthetized.  The patient was heparinized.  The brachial artery was clamped proximally and distally and a longitudinal arteriotomy was made.  Only a short segment of the 4 mm end of the graft was excised, the graft slightly spatulated and sewn end-to-side to the brachial artery using continuous 6-0 Prolene suture.  The graft in for the appropriate length for anastomosis to the high brachial vein.  The vein was ligated distally and spatulated proximally.  The graft cut the appropriate length spatulated and sewn into into the vein using 2 continuous 6-0 Prolene sutures.  At the completion was an excellent thrill in the graft.  There was a radial and ulnar signal with the Doppler.  The heparin was partially reversed with protamine.  The wounds were each closed with a  deep layer of 3-0 Vicryl and the skin closed with 4-0 Monocryl.  Dermabond was applied.  The patient tolerated the procedure well and was transferred to the recovery room in stable condition.  All needle and sponge counts were correct. ? ?Given the complexity of the case a first assistant was necessary in order to expedient the procedure and safely perform the technical aspects of the operation. ? ?Deitra Mayo, MD, FACS ?[facility] ? ?DATE OF DICTATION:   11/26/2021 ? ?

## 2021-11-26 NOTE — Progress Notes (Signed)
eLink Physician-Brief Progress Note ?Patient Name: Anthony [name] ?DOB: 08/22/1941 ?MRN: [id number] ? ? ?Date of Service ? 11/26/2021  ?HPI/Events of Note ? Hypocalcemia - Ca++ = 8.2 which corrects to 8.68 (Low) given albumin = 3.4. K+ = 7.3, Creatinine = 7.26 and Glucose = 96.  ?eICU Interventions ? Plan: ?Will replace Ca++.  ? ? ? ?Intervention Category ?Major Interventions: Electrolyte abnormality - evaluation and management ? ?Lashawn Orrego Cornelia Copa ?11/26/2021, 9:10 PM ?

## 2021-11-26 NOTE — Progress Notes (Unsigned)
Cardiology Office Note    Date:  11/26/2021   ID:  Anthony [name], DOB [birth year], MRN [id number]   PCP:  Anthony [name], Anthony Halsted, MD   [place]  Cardiologist:  Anthony Him, MD *** Advanced Practice Provider:  No care team member to display Electrophysiologist:  None   [phone]   No chief complaint on file.   History of Present Illness:  Anthony [name] is a 81 y.o. male with a hx of paroxysmal atrial fibrillation not on anticoagulation given history of recurrent GI bleeds, substance abuse, end-stage renal disease dialysis dependent, latent tuberculosis, diabetes mellitus, chronic diastolic congestive heart failure, COPD, dementia, previous CVA, hypertension.   Nuclear study June 2021 showed ejection fraction 39%, apical infarct versus artifact and ejection fraction 39%.  No ischemia.  Dobutamine echocardiogram April 2022 at [facility] showed baseline mild LV systolic dysfunction with possible lack of augmentation of the septum. 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 Treated medically.  Echocardiogram July 2022 showed normal LV function, mild left ventricular hypertrophy, grade 2 diastolic dysfunction, moderate left atrial enlargement, mild mitral stenosis with mitral valve area 1.9 cm.  Patient was seen in the hospital for recurrent chest pain and DOE-no EKG changes, troponins chronic minimal elevation and recent cath without obstructive disease so no ischemic work up. ASA resumed, lipitor increased 80 mg daily    Past Medical History:  Diagnosis Date   Alcohol abuse 2021   Anemia    Arthritis    CAD (coronary artery disease)    CHF (congestive heart failure) (HCC)    Chicken pox    COVID 02/2021   Dementia (HCC)    mild   ESRD (end stage renal disease) on dialysis ([place])    GI bleed     History of blood transfusion 03/2021   Hypertension    Insomnia    Mitral regurgitation 05/16/2021   PAF (paroxysmal atrial fibrillation) (HCC)    Pneumonia    Positive QuantiFERON-TB Gold test 05/16/2021   Stroke ([place])    TB lung, latent 05/16/2021   Vaccine counseling 11/04/2021    Past Surgical History:  Procedure Laterality Date   AV FISTULA PLACEMENT Right 05/14/2021   Procedure: INSERTION OF ARTERIOVENOUS (AV) GORE-TEX GRAFT ARM;  Surgeon: Anthony Mould, MD;  Location: [facility] OR;  Service: Vascular;  Laterality: Right;   ENTEROSCOPY N/A 04/18/2021   Procedure: ENTEROSCOPY;  Surgeon: Anthony Denmark, MD;  Location: [facility] ENDOSCOPY;  Service: Endoscopy;  Laterality: N/A;   HOT HEMOSTASIS N/A 04/18/2021   Procedure: HOT HEMOSTASIS (ARGON PLASMA COAGULATION/BICAP);  Surgeon: Anthony Denmark, MD;  Location: [facility] ENDOSCOPY;  Service: Endoscopy;  Laterality: N/A;   SUBMUCOSAL TATTOO INJECTION  04/18/2021   Procedure: SUBMUCOSAL TATTOO INJECTION;  Surgeon: Anthony Denmark, MD;  Location: [facility] ENDOSCOPY;  Service: Endoscopy;;    Current Medications: No outpatient medications have been marked as taking for the 12/10/21 encounter (Appointment) with Anthony Burn, PA-C.     Allergies:   Patient has no known allergies.   Social History   Socioeconomic History   Marital status: Married    Spouse name: Not on file   Number of children: Not on file   Years of education: Not on file   Highest education level: Not on file  Occupational History   Not on file  Tobacco Use   Smoking  status: Every Day    Packs/day: 0.25    Years: 61.00    Pack years: 15.25    Types: Cigarettes   Smokeless tobacco: Never   Tobacco comments:    Patient is not currently smoking at [facility] and [facility].  Vaping Use   Vaping Use: Never used  Substance and Sexual Activity   Alcohol use: Not Currently    Comment: quit 2021   Drug use: Not Currently   Sexual activity: Not Currently  Other Topics  Concern   Not on file  Social History Narrative   Not on file   Social Determinants of Health   Financial Resource Strain: Not on file  Food Insecurity: Not on file  Transportation Needs: Not on file  Physical Activity: Not on file  Stress: Not on file  Social Connections: Not on file     Family History:  The patient's ***family history includes Hypertension in his father and mother.   ROS:   Please see the history of present illness.    ROS All other systems reviewed and are negative.   PHYSICAL EXAM:   VS:  There were no vitals taken for this visit.  Physical Exam  GEN: Well nourished, well developed, in no acute distress  HEENT: normal  Neck: no JVD, carotid bruits, or masses Cardiac:RRR; no murmurs, rubs, or gallops  Respiratory:  clear to auscultation bilaterally, normal work of breathing GI: soft, nontender, nondistended, + BS Ext: without cyanosis, clubbing, or edema, Good distal pulses bilaterally MS: no deformity or atrophy  Skin: warm and dry, no rash Neuro:  Alert and Oriented x 3, Strength and sensation are intact Psych: euthymic mood, full affect  Wt Readings from Last 3 Encounters:  11/26/21 145 lb (65.8 kg)  11/05/21 145 lb 8 oz (66 kg)  10/10/21 154 lb 14.4 oz (70.3 kg)      Studies/Labs Reviewed:   EKG:  EKG is*** ordered today.  The ekg ordered today demonstrates ***  Recent Labs: 04/17/2021: TSH 0.458 08/23/2021: B Natriuretic Peptide 409.2 11/04/2021: ALT <5; Magnesium 2.2; Platelets 267 11/26/2021: BUN 34; Creatinine, Ser 5.50; Hemoglobin 7.1; Potassium 2.8; Sodium 142   Lipid Panel No results found for: CHOL, TRIG, HDL, CHOLHDL, VLDL, LDLCALC, LDLDIRECT  Additional studies/ records that were reviewed today include:  Echo 10/31/21 IMPRESSIONS     1. Left ventricular ejection fraction, by estimation, is 50 to 55%. The  left ventricle has low normal function. The left ventricle has no regional  wall motion abnormalities. There is moderate  concentric left ventricular  hypertrophy. Left ventricular  diastolic parameters are consistent with Grade I diastolic dysfunction  (impaired relaxation). Elevated left ventricular end-diastolic pressure.   2. Right ventricular systolic function is normal. The right ventricular  size is normal. Tricuspid regurgitation signal is inadequate for assessing  PA pressure.   3. Left atrial size was moderately dilated.   4. Severe MAC with protrusion of calcification beyond annulus and into  left atrium. The mitral valve is grossly normal. Trivial mitral valve  regurgitation. Severe mitral annular calcification.   5. The aortic valve is grossly normal. There is moderate calcification of  the aortic valve. Aortic valve regurgitation is not visualized. Aortic  valve sclerosis/calcification is present, without any evidence of aortic  stenosis.   6. The inferior vena cava is normal in size with greater than 50%  respiratory variability, suggesting right atrial pressure of 3 mmHg.   Comparison(s): No significant change from prior study.  Risk Assessment/Calculations:   {Does this patient have ATRIAL FIBRILLATION?[phone]}     ASSESSMENT:    No diagnosis found.   PLAN:  In order of problems listed above:  Chest pain-patient has had difficulties with recurrent chest pain.  He presents with recurrent symptoms lasting approximately 3 hours.  His electrocardiogram shows no new T wave changes.  His troponin is minimally elevated but is chronically elevated in reviewing previous laboratories.  He had a cardiac catheterization in June that showed nonobstructive coronary disease as outlined.  Would not pursue further ischemia evaluation.  Coronary artery disease-nonobstructive on cath 02/2021 would resume home dose of aspirin at 81 mg daily and statin. Echo 10/31/21-normal LVEF 50-55% grade 1 DD  Hypertension-would resume carvedilol at preadmission dose at discharge and  follow.  Hyperlipidemia-given documented coronary disease would increase Lipitor to 80 mg daily.  Check lipids and liver in 8 weeks.  paroxysmal atrial fibrillation-he is in sinus rhythm with nonconducted PACs.  Would continue carvedilol at discharge.  He is not on anticoagulation as he apparently has a history of recurrent GI bleeding.  This is documented in notes from [place].  End-stage renal disease-dialysis per nephrology.      Shared Decision Making/Informed Consent   {Are you ordering a CV Procedure (e.g. stress test, cath, DCCV, TEE, etc)?   Press F2        :[id number]}    Medication Adjustments/Labs and Tests Ordered: Current medicines are reviewed at length with the patient today.  Concerns regarding medicines are outlined above.  Medication changes, Labs and Tests ordered today are listed in the Patient Instructions below. There are no Patient Instructions on file for this visit.   Sumner Boast, PA-C  11/26/2021 9:36 AM    [facility] [place], [place], [place]  [address] Phone: [phone]; Fax: [phone]

## 2021-11-26 NOTE — Anesthesia Procedure Notes (Signed)
Anesthesia Regional Block: Supraclavicular block  ? ?Pre-Anesthetic Checklist: , timeout performed,  Correct Patient, Correct Site, Correct Laterality,  Correct Procedure, Correct Position, site marked,  Risks and benefits discussed,  Surgical consent,  Pre-op evaluation,  At surgeon's request and post-op pain management ? ?Laterality: Right ? ?Prep: Dura Prep     ?  ?Needles:  ?Injection technique: Single-shot ? ?Needle Type: Echogenic Stimulator Needle   ? ? ?Needle Length: 5cm  ?Needle Gauge: 20  ? ? ? ?Additional Needles: ? ? ?Procedures:,,,, ultrasound used (permanent image in chart),,    ?Narrative:  ?Start time: 11/26/2021 8:38 AM ?End time: 11/26/2021 8:41 AM ?Injection made incrementally with aspirations every 5 mL. ? ?Performed by: Personally  ?Anesthesiologist: Darral Dash, DO ? ?Additional Notes: ?Patient identified. Risks/Benefits/Options discussed with patient including but not limited to bleeding, infection, nerve damage, failed block, incomplete pain control. Patient expressed understanding and wished to proceed. All questions were answered. Sterile technique was used throughout the entire procedure. Please see nursing notes for vital signs. Aspirated in 5cc intervals with injection for negative confirmation. Patient was given instructions on fall risk and not to get out of bed. All questions and concerns addressed with instructions to call with any issues or inadequate analgesia.   ?  ? ? ? ? ?

## 2021-11-26 NOTE — Anesthesia Postprocedure Evaluation (Signed)
Anesthesia Post Note ? ?Patient: Anthony [name] ? ?Procedure(s) Performed: INSERTION OF RIGHT UPPER ARM ARTERIOVENOUS (AV) GORE-TEX STRETCH GRAFT (4-78mx45cm) (Right: Arm Lower) ? ?  ? ?Patient location during evaluation: PACU ?Anesthesia Type: Regional and MAC ?Level of consciousness: awake and alert ?Pain management: pain level controlled ?Vital Signs Assessment: post-procedure vital signs reviewed and stable ?Respiratory status: spontaneous breathing, nonlabored ventilation, respiratory function stable and patient connected to nasal cannula oxygen ?Cardiovascular status: stable and blood pressure returned to baseline ?Postop Assessment: no apparent nausea or vomiting ?Anesthetic complications: no ? ? ?No notable events documented. ? ?Last Vitals:  ?Vitals:  ? 11/26/21 1409 11/26/21 1426  ?BP: (!) 99/54 (!) 99/56  ?Pulse:    ?Resp: 12 11  ?Temp:    ?SpO2: 98% 98%  ?  ?Last Pain:  ?Vitals:  ? 11/26/21 1426  ?TempSrc:   ?PainSc: Asleep  ? ? ?  ?  ?  ?  ?  ?  ? ?GMarch RummageStoltzfus ? ? ? ? ?

## 2021-11-26 NOTE — Addendum Note (Signed)
Addendum  created 11/26/21 1911 by Eligha Bridegroom, CRNA  ? Intraprocedure Event edited  ?  ?

## 2021-11-26 NOTE — Progress Notes (Signed)
Belongings with patient at arrival to 4N ICU: ?Wheel chair ?Cell phone ?Glasses ?Blue boxers ?Black pants ?Gray shirt ?Black hat ?

## 2021-11-26 NOTE — Transfer of Care (Signed)
Immediate Anesthesia Transfer of Care Note ? ?Patient: Anthony [name] ? ?Procedure(s) Performed: INSERTION OF RIGHT UPPER ARM ARTERIOVENOUS (AV) GORE-TEX STRETCH GRAFT (4-44mx45cm) (Right: Arm Lower) ? ?Patient Location: PACU ? ?Anesthesia Type:MAC ? ?Level of Consciousness: awake and alert  ? ?Airway & Oxygen Therapy: Patient Spontanous Breathing and Patient connected to nasal cannula oxygen ? ?Post-op Assessment: Report given to RN and Post -op Vital signs reviewed and stable ? ?Post vital signs: Reviewed and stable ? ?Last Vitals:  ?Vitals Value Taken Time  ?BP 123/65 11/26/21 1054  ?Temp    ?Pulse 59 11/26/21 1054  ?Resp 12 11/26/21 1057  ?SpO2 86 % 11/26/21 1054  ?Vitals shown include unvalidated device data. ? ?Last Pain:  ?Vitals:  ? 11/26/21 0755  ?TempSrc:   ?PainSc: 9   ?   ? ?Patients Stated Pain Goal: 3 (11/26/21 0755) ? ?Complications: No notable events documented. ?

## 2021-11-26 NOTE — H&P (Addendum)
NAME:  Anthony [name], MRN:  [id number], DOB:  April 16, 1941, LOS: 0 ADMISSION DATE:  11/26/2021, CONSULTATION DATE:  11/26/21 REFERRING MD:  Scot Dock, CHIEF COMPLAINT:  hypotension   History of Present Illness:  Anthony [name] is a 81 y.o. M with PMH significant for ESRD, latent TB, MR, GIB, HFpEF who presented to vascular surgery for RUE AV graft on 3/7.   Post-procedure, pt was hypotensive, bradycardic and hypoglycemic and was started on dopamine and given D50 and PCCM consulted for admission.   Glucose as low as 47, K 2.8, creatinine 5.5, Hgb 7.1.  He was given 25g Albumin, multiple amps D50, K 35meq and started on Dopamine.    Pertinent  Medical History   has a past medical history of Alcohol abuse (2021), Anemia, Arthritis, CAD (coronary artery disease), CHF (congestive heart failure) ([place]), Chicken pox, COVID (02/2021), Dementia ([employer]), ESRD (end stage renal disease) on dialysis [facility]), GI bleed, History of blood transfusion (03/2021), Hypertension, Insomnia, Mitral regurgitation (05/16/2021), PAF (paroxysmal atrial fibrillation) ([place]), Pneumonia, Positive QuantiFERON-TB Gold test (05/16/2021), Stroke ([place]), TB lung, latent (05/16/2021), and Vaccine counseling (11/04/2021).   Significant Hospital Events: Including procedures, antibiotic start and stop dates in addition to other pertinent events   3/7 presented for AV graft placement, hypotensive, hypoglycemic post-procedure, admit to PCCM  Interim History / Subjective:  Pt sleeping, arouses to voice  On Dopamine 28mcg and D5  Objective   Blood pressure (!) 99/56, pulse (!) 53, temperature 98 F (36.7 C), resp. rate 11, height 5\' 7"  (1.702 m), weight 65.8 kg, SpO2 98 %.       No intake or output data in the 24 hours ending 11/26/21 1442 Filed Weights   11/25/21 1205 11/26/21 0727  Weight: 66.9 kg 65.8 kg   General:  well-nourished, fatigued-appearing M, resting in bed HEENT: MM pink/moist, sclera anicteric  Neuro: sleeping soundly,  awakens to voice and sternal rub and will move extremities to command and then quickly falls back asleep  CV: s1s2 bradycardic, regular, no m/r/g, R chest implanted port PULM:  no significant rhonchi or wheezing, on [place] without retractions or distress GI: soft, non-tender Extremities: warm/dry, no edema  Skin: no rashes or lesions   Resolved Hospital Problem list     Assessment & Plan:    Acute Hypotension and Encephalopathy, likely related to sedation  Improving, on low dose Dopamine -Admit to ICU, continue Dopamine -check CBC, CMP, Lactic Acid, Magnesium -CXR -ABG -250cc IVF bolus    Acute Anemia Hgb 7.1 on Istat -confirm with CBC  -type and screen -trend and transfuse for Hgb <7     Hypoglycemia -start D10 75cc/hr -q2hr glucose checks    Bradycardia, HTN, chronic HFpEF  Atrial Fibrillation -hold home Coreg, not anti-coagulated per home meds -telemetry monitoring, labs pending -EKG    Latent TB -continue Isoniazid -duonebs, Dulera nebs   Dementia -hold home Namenda     Best Practice (right click and "Reselect all SmartList Selections" daily)   Diet/type: NPO DVT prophylaxis: prophylactic heparin  GI prophylaxis: N/A Lines: n/a Foley:  N/A Code Status:  full code Last date of multidisciplinary goals of care discussion [pending]  Labs   CBC: Recent Labs  Lab 11/26/21 0820  HGB 7.1*  HCT 21.0*    Basic Metabolic Panel: Recent Labs  Lab 11/26/21 0820  NA 142  K 2.8*  CL 106  GLUCOSE 59*  BUN 34*  CREATININE 5.50*   GFR: Estimated Creatinine Clearance: 10.3 mL/min (A) (by C-G formula based on  SCr of 5.5 mg/dL (H)). No results for input(s): PROCALCITON, WBC, LATICACIDVEN in the last 168 hours.  Liver Function Tests: No results for input(s): AST, ALT, ALKPHOS, BILITOT, PROT, ALBUMIN in the last 168 hours. No results for input(s): LIPASE, AMYLASE in the last 168 hours. No results for input(s): AMMONIA in the last 168  hours.  ABG    Component Value Date/Time   PHART 7.441 08/17/2021 1919   PCO2ART 42.4 08/17/2021 1919   PO2ART 79.3 (L) 08/17/2021 1919   HCO3 28.4 (H) 08/17/2021 1919   TCO2 21 (L) 11/26/2021 0820   O2SAT 94.1 08/17/2021 1919     Coagulation Profile: No results for input(s): INR, PROTIME in the last 168 hours.  Cardiac Enzymes: No results for input(s): CKTOTAL, CKMB, CKMBINDEX, TROPONINI in the last 168 hours.  HbA1C: No results found for: HGBA1C  CBG: Recent Labs  Lab 11/26/21 1058 11/26/21 1148 11/26/21 1312 11/26/21 1401 11/26/21 1426  GLUCAP 78 63* 47* 64* 124*    Review of Systems:   Unable to obtain secondary to mental status  Past Medical History:  He,  has a past medical history of Alcohol abuse (2021), Anemia, Arthritis, CAD (coronary artery disease), CHF (congestive heart failure) ([place]), Chicken pox, COVID (02/2021), Dementia ([place]), ESRD (end stage renal disease) on dialysis [facility]), GI bleed, History of blood transfusion (03/2021), Hypertension, Insomnia, Mitral regurgitation (05/16/2021), PAF (paroxysmal atrial fibrillation) ([place]), Pneumonia, Positive QuantiFERON-TB Gold test (05/16/2021), Stroke ([place]), TB lung, latent (05/16/2021), and Vaccine counseling (11/04/2021).   Surgical History:   Past Surgical History:  Procedure Laterality Date   AV FISTULA PLACEMENT Right 05/14/2021   Procedure: INSERTION OF ARTERIOVENOUS (AV) GORE-TEX GRAFT ARM;  Surgeon: Angelia Mould, MD;  Location: [facility] OR;  Service: Vascular;  Laterality: Right;   ENTEROSCOPY N/A 04/18/2021   Procedure: ENTEROSCOPY;  Surgeon: Jackquline Denmark, MD;  Location: [facility] ENDOSCOPY;  Service: Endoscopy;  Laterality: N/A;   HOT HEMOSTASIS N/A 04/18/2021   Procedure: HOT HEMOSTASIS (ARGON PLASMA COAGULATION/BICAP);  Surgeon: Jackquline Denmark, MD;  Location: [facility] ENDOSCOPY;  Service: Endoscopy;  Laterality: N/A;   SUBMUCOSAL TATTOO INJECTION  04/18/2021   Procedure: SUBMUCOSAL TATTOO INJECTION;  Surgeon:  Jackquline Denmark, MD;  Location: [facility] ENDOSCOPY;  Service: Endoscopy;;     Social History:   reports that he has been smoking cigarettes. He has a 15.25 pack-year smoking history. He has never used smokeless tobacco. He reports that he does not currently use alcohol. He reports that he does not currently use drugs.   Family History:  His family history includes Hypertension in his father and mother.   Allergies No Known Allergies   Home Medications  Prior to Admission medications   Medication Sig Start Date End Date Taking? Authorizing Provider  albuterol (PROVENTIL HFA) 108 (90 Base) MCG/ACT inhaler Inhale 2 puffs into the lungs every 4 (four) hours as needed for wheezing or shortness of breath. 03/08/20  Yes Laurey Morale, MD  aspirin EC 81 MG tablet Take 81 mg by mouth every evening. (1700) Swallow whole.   Yes [provider]  atorvastatin (LIPITOR) 20 MG tablet TAKE 1 TABLET BY MOUTH EVERYDAY AT BEDTIME 07/30/20  Yes Isaac Bliss, Rayford Halsted, MD  budesonide-formoterol [facility]) 160-4.5 MCG/ACT inhaler Inhale 2 puffs into the lungs in the morning. (0900) 04/12/21  Yes [provider]  calcium acetate (PHOSLO) 667 MG capsule Take 667 mg by mouth 3 (three) times daily after meals. (1000, 1300 & 1800)   Yes [provider]  carboxymethylcellulose (REFRESH  PLUS) 0.5 % SOLN Place 1 drop into both eyes daily as needed (dry eyes). 12/07/20  Yes [provider]  carvedilol (COREG) 6.25 MG tablet Take 1 tablet (6.25 mg total) by mouth 2 (two) times daily with a meal. 03/02/20  Yes Black, Lezlie Octave, NP  gabapentin (NEURONTIN) 100 MG capsule Take 100 mg by mouth at bedtime. (2100)   Yes [provider]  isoniazid (NYDRAZID) 300 MG tablet TAKE 1 TABLET BY MOUTH EVERY DAY 07/26/21  Yes Tommy Medal, Lavell Islam, MD  memantine (NAMENDA) 5 MG tablet Take 5 mg by mouth at bedtime. (2100) 03/12/21  Yes [provider]  nicotine (NICODERM CQ - DOSED IN MG/24 HOURS)  14 mg/24hr patch Place 1 patch (14 mg total) onto the skin daily. 11/05/21 11/05/22 Yes Sheikh, Omair Latif, DO  oxyCODONE-acetaminophen (PERCOCET) 5-325 MG tablet Take 1 tablet by mouth every 6 (six) hours as needed. 11/26/21  Yes Rhyne, Samantha J, PA-C  pantoprazole (PROTONIX) 40 MG tablet Take 1 tablet (40 mg total) by mouth daily. 04/24/21  Yes Mariel Aloe, MD  pyridOXINE (VITAMIN B-6) 50 MG tablet Take 1 tablet (50 mg total) by mouth daily. 05/16/21 02/10/22 Yes Truman Hayward, MD  Tiotropium Bromide Monohydrate (SPIRIVA RESPIMAT) 2.5 MCG/ACT AERS Inhale 2 puffs into the lungs daily. 03/15/20  Yes Isaac Bliss, Rayford Halsted, MD  traZODone (DESYREL) 50 MG tablet Take 1-2 tablets (50-100 mg total) by mouth at bedtime as needed. 06/12/21  Yes Erline Hau, MD     Critical care time: 40 minutes    CRITICAL CARE Performed by: Otilio Carpen Daijon Wenke   Total critical care time: 40 minutes  Critical care time was exclusive of separately billable procedures and treating other patients.  Critical care was necessary to treat or prevent imminent or life-threatening deterioration.  Critical care was time spent personally by me on the following activities: development of treatment plan with patient and/or surrogate as well as nursing, discussions with consultants, evaluation of patient's response to treatment, examination of patient, obtaining history from patient or surrogate, ordering and performing treatments and interventions, ordering and review of laboratory studies, ordering and review of radiographic studies, pulse oximetry and re-evaluation of patient's condition.    Otilio Carpen Agata Lucente, PA-C [facility] See Amion for pager If no response to pager , please call [phone] until 7pm After 7:00 pm call Elink  [phone]

## 2021-11-26 NOTE — H&P (Signed)
? ?ASSESSMENT & PLAN  ? ?END-STAGE RENAL DISEASE: This patient has had grafts in both arms.  Based on his vein map he does not appear to be a candidate for a fistula.  His next logical place for access is a right upper arm graft.  He is becoming a real access challenge as he has evidence of severe infrainguinal arterial occlusive disease bilaterally and is likely not a candidate for thigh graft at this point.  He dialyzes on Monday Wednesdays and Fridays. ? ?PERIPHERAL VASCULAR DISEASE: Patient has evidence of infrainguinal arterial occlusive disease and is having some pain in his feet although he has no wounds.  When he comes in for a follow-up visit in 1 to 2 weeks I will arrange for formal arterial Doppler studies. ? ?REASON FOR ADMISSION:   ? ?For hemodialysis access. ? ?HPI:  ? ?Anthony [name] is a 81 y.o. male who I last saw on 08/08/2021.  I placed a right forearm graft on Anthony [name] on 05/14/2021.  He has a remote history of a left arm graft.  He came in with an occluded graft in November.  He had a functioning right IJ tunneled dialysis catheter.  I reviewed his arterial duplex and upper extremity vein map and I felt that the next logical place for access would be a right upper arm graft.  He had no options for a fistula based on his vein map.  On the right side he had a monophasic radial signal with the Doppler and a biphasic ulnar signal.  The brachial artery measured 4.7 mm in diameter.  Given the monophasic radial signal I thought he was at risk for steal and graft thrombosis however I thought we should try a right upper arm graft before considering a thigh graft.  He dialyzes on Monday Wednesdays and Fridays.  He was scheduled for surgery on 09/19/2021 but apparently this did not take place. ? ? ?Past Medical History:  ?Diagnosis Date  ? Alcohol abuse 2021  ? Anemia   ? Arthritis   ? CAD (coronary artery disease)   ? CHF (congestive heart failure) ([place])   ? Chicken pox   ? COVID 02/2021  ? Dementia ([place])    ? mild  ? ESRD (end stage renal disease) on dialysis [facility])   ? GI bleed   ? History of blood transfusion 03/2021  ? Hypertension   ? Insomnia   ? Mitral regurgitation 05/16/2021  ? PAF (paroxysmal atrial fibrillation) ([place])   ? Pneumonia   ? Positive QuantiFERON-TB Gold test 05/16/2021  ? Stroke [facility])   ? TB lung, latent 05/16/2021  ? Vaccine counseling 11/04/2021  ? ? ?Family History  ?Problem Relation Age of Onset  ? Hypertension Mother   ? Hypertension Father   ? ? ?SOCIAL HISTORY: ?Social History  ? ?Tobacco Use  ? Smoking status: Every Day  ?  Packs/day: 0.25  ?  Years: 61.00  ?  Pack years: 15.25  ?  Types: Cigarettes  ? Smokeless tobacco: Never  ? Tobacco comments:  ?  Patient is not currently smoking at [facility] and [facility].  ?Substance Use Topics  ? Alcohol use: Not Currently  ?  Comment: quit 2021  ? ? ?No Known Allergies ? ?Current Facility-Administered Medications  ?Medication Dose Route Frequency Provider Last Rate Last Admin  ? 0.9 %  sodium chloride infusion   Intravenous Continuous Angelia Mould, MD      ? 0.9 %  sodium chloride infusion  Intravenous Continuous Stoltzfus, Gregory P, DO      ? ceFAZolin (ANCEF) IVPB 2g/100 mL premix  2 g Intravenous 30 min Pre-Op Angelia Mould, MD      ? chlorhexidine (HIBICLENS) 4 % liquid 4 application.  60 mL Topical Once Angelia Mould, MD      ? And  ? [START ON 11/27/2021] chlorhexidine (HIBICLENS) 4 % liquid 4 application.  60 mL Topical Once Angelia Mould, MD      ? chlorhexidine (PERIDEX) 0.12 % solution 15 mL  15 mL Mouth/Throat Once Stoltzfus, March Rummage, DO      ? Or  ? MEDLINE mouth rinse  15 mL Mouth Rinse Once Stoltzfus, Gregory P, DO      ? ? ?REVIEW OF SYSTEMS:  ?[X]  denotes positive finding, [ ]  denotes negative finding ?Cardiac  Comments:  ?Chest pain or chest pressure:    ?Shortness of breath upon exertion:    ?Short of breath when lying flat:    ?Irregular heart rhythm:    ?    ?Vascular    ?Pain in  calf, thigh, or hip brought on by ambulation:    ?Pain in feet at night that wakes you up from your sleep:  x   ?Blood clot in your veins:    ?Leg swelling:     ?    ?Pulmonary    ?Oxygen at home:    ?Productive cough:     ?Wheezing:     ?    ?Neurologic    ?Sudden weakness in arms or legs:     ?Sudden numbness in arms or legs:     ?Sudden onset of difficulty speaking or slurred speech:    ?Temporary loss of vision in one eye:     ?Problems with dizziness:     ?    ?Gastrointestinal    ?Blood in stool:     ?Vomited blood:     ?    ?Genitourinary    ?Burning when urinating:     ?Blood in urine:    ?    ?Psychiatric    ?Major depression:     ?    ?Hematologic    ?Bleeding problems:    ?Problems with blood clotting too easily:    ?    ?Skin    ?Rashes or ulcers:    ?    ?Constitutional    ?Fever or chills:    ?- ? ?PHYSICAL EXAM:  ? ?Vitals:  ? 11/25/21 1205 11/26/21 0727  ?BP:  (!) 121/53  ?Temp:  98.7 ?F (37.1 ?C)  ?TempSrc:  Oral  ?Weight: 66.9 kg 65.8 kg  ?Height: 5\' 7"  (1.702 m) 5\' 7"  (1.702 m)  ? ?Body mass index is 22.71 kg/m?. ?GENERAL: The patient is a well-nourished male, in no acute distress. The vital signs are documented above. ?CARDIAC: There is a regular rate and rhythm.  ?VASCULAR:  ?He has a monophasic anterior tibial signal on the left. ?I cannot obtain Doppler signals of the right foot although the foot appears to be adequately perfused. ?PULMONARY: There is good air exchange bilaterally without wheezing or rales. ?MUSCULOSKELETAL: There are no major deformities. ?NEUROLOGIC: No focal weakness or paresthesias are detected. ?SKIN: There are no ulcers or rashes noted. ?PSYCHIATRIC: The patient has a normal affect. ? ?DATA:   ? ?ARTERIAL DUPLEX: I have independently interpreted his arterial duplex scan today. ?  ?On the right side there is a monophasic radial signal with a biphasic ulnar signal.  The brachial artery measures 4.7 mm in diameter. ?  ?On the left side, there is a monophasic radial signal  and monophasic ulnar signal.  The brachial artery measures 4.8 mm in diameter. ?  ?BILATERAL UPPER EXTREMITY VEIN MAP: I have independently interpreted his upper extremity vein map. ?  ?On the right side he does not appear to have any adequate vein in the upper arm or forearm for a fistula. ?  ?On the left side, he does not appear to have any adequate vein for an upper arm or forearm fistula. ? ?Deitra Mayo ?[facility]  ? ? ?

## 2021-11-26 NOTE — Anesthesia Procedure Notes (Signed)
Procedure Name: Cataract ?Date/Time: 11/26/2021 9:14 AM ?Performed by: Eligha Bridegroom, CRNA ?Pre-anesthesia Checklist: Patient identified, Emergency Drugs available, Suction available, Patient being monitored and Timeout performed ?Patient Re-evaluated:Patient Re-evaluated prior to induction ?Oxygen Delivery Method: Nasal cannula ?Preoxygenation: Pre-oxygenation with 100% oxygen ? ? ? ? ?

## 2021-11-26 NOTE — Discharge Instructions (Signed)
? ?  [facility] [place] ? ?Discharge Instructions ? ?AV Fistula or Graft Surgery for Dialysis Access ? ?Please refer to the following instructions for your post-procedure care. Your surgeon or physician assistant will discuss any changes with you. ? ?Activity ? ?You may drive the day following your surgery, if you are comfortable and no longer taking prescription pain medication. Resume full activity as the soreness in your incision resolves. ? ?Bathing/Showering ? ?You may shower after you go home. Keep your incision dry for 48 hours. Do not soak in a bathtub, hot tub, or swim until the incision heals completely. You may not shower if you have a hemodialysis catheter. ? ?Incision Care ? ?Clean your incision with mild soap and water after 48 hours. Pat the area dry with a clean towel. You do not need a bandage unless otherwise instructed. Do not apply any ointments or creams to your incision. You may have skin glue on your incision. Do not peel it off. It will come off on its own in about one week. Your arm may swell a bit after surgery. To reduce swelling use pillows to elevate your arm so it is above your heart. Your doctor will tell you if you need to lightly wrap your arm with an ACE bandage. ? ?Diet ? ?Resume your normal diet. There are not special food restrictions following this procedure. In order to heal from your surgery, it is CRITICAL to get adequate nutrition. Your body requires vitamins, minerals, and protein. Vegetables are the best source of vitamins and minerals. Vegetables also provide the perfect balance of protein. Processed food has little nutritional value, so try to avoid this. ? ?Medications ? ?Resume taking all of your medications. If your incision is causing pain, you may take over-the counter pain relievers such as acetaminophen (Tylenol). If you were prescribed a stronger pain medication, please be aware these medications can cause nausea and constipation. Prevent  nausea by taking the medication with a snack or meal. Avoid constipation by drinking plenty of fluids and eating foods with high amount of fiber, such as fruits, vegetables, and grains.  ?Do not take Tylenol if you are taking prescription pain medications. ? ?Follow up ?Your surgeon may want to see you in the office following your access surgery. If so, this will be arranged at the time of your surgery. ? ?Please call us immediately for any of the following conditions: ? ?Increased pain, redness, drainage (pus) from your incision site ?Fever of 101 degrees or higher ?Severe or worsening pain at your incision site ?Hand pain or numbness. ? ?Reduce your risk of vascular disease: ? ?Stop smoking. If you would like help, call [facility] at 1-800-QUIT-NOW 928-374-0107) or [place] at (620)660-9091 ? ?Manage your cholesterol ?Maintain a desired weight ?Control your diabetes ?Keep your blood pressure down ? ?Dialysis ? ?It will take several weeks to several months for your new dialysis access to be ready for use. Your surgeon will determine when it is okay to use it. Your nephrologist will continue to direct your dialysis. You can continue to use your Permcath until your new access is ready for use. ? ? ?11/26/2021 ?Anthony [name] ?[id number] ?[birth year] ? ?Surgeon(s): ?Angelia Mould, MD ? ?Procedure(s): ?INSERTION OF RIGHT UPPER ARM ARTERIOVENOUS (AV) GORE-TEX STRETCH GRAFT (4-29mmx45cm) ? ?x Do not stick graft for 4 weeks  ? ? ?If you have any questions, please call the office at [phone]. ? ?

## 2021-11-27 ENCOUNTER — Encounter (HOSPITAL_COMMUNITY): Payer: Self-pay | Admitting: Vascular Surgery

## 2021-11-27 DIAGNOSIS — E162 Hypoglycemia, unspecified: Secondary | ICD-10-CM | POA: Diagnosis not present

## 2021-11-27 LAB — GLUCOSE, CAPILLARY
Glucose-Capillary: 73 mg/dL (ref 70–99)
Glucose-Capillary: 113 mg/dL — ABNORMAL HIGH (ref 70–99)
Glucose-Capillary: 117 mg/dL — ABNORMAL HIGH (ref 70–99)
Glucose-Capillary: 99 mg/dL (ref 70–99)
Glucose-Capillary: 167 mg/dL — ABNORMAL HIGH (ref 70–99)
Glucose-Capillary: 168 mg/dL — ABNORMAL HIGH (ref 70–99)
Glucose-Capillary: 165 mg/dL — ABNORMAL HIGH (ref 70–99)
Glucose-Capillary: 155 mg/dL — ABNORMAL HIGH (ref 70–99)
Glucose-Capillary: 108 mg/dL — ABNORMAL HIGH (ref 70–99)

## 2021-11-27 MED ORDER — PREDNISONE 20 MG PO TABS
40.0000 mg | ORAL_TABLET | Freq: Every day | ORAL | Status: AC
  Administered 2021-11-27 – 2021-12-01 (×5): 40 mg via ORAL
  Filled 2021-11-27 (×5): qty 2

## 2021-11-27 NOTE — Progress Notes (Signed)
? ?NAME:  Anthony [name], MRN:  [id number], DOB:  1941-03-17, LOS: 1 ?ADMISSION DATE:  11/26/2021, CONSULTATION DATE:  11/27/21 ?REFERRING MD:  Scot Dock, CHIEF COMPLAINT:  hypotension  ? ?History of Present Illness:  ?Anthony [name] is a 81 y.o. M with PMH significant for ESRD, latent TB, MR, GIB, HFpEF who presented to vascular surgery for RUE AV graft on 3/7.   Post-procedure, pt was hypotensive, bradycardic and hypoglycemic and was started on dopamine and given D50 and PCCM consulted for admission.   Glucose as low as 47, K 2.8, creatinine 5.5, Hgb 7.1. ? ?He was given 25g Albumin, multiple amps D50, K 10meq and started on Dopamine.   ? ?Pertinent  Medical History  ? has a past medical history of Alcohol abuse (2021), Anemia, Arthritis, CAD (coronary artery disease), CHF (congestive heart failure) ([place]), Chicken pox, COVID (02/2021), Dementia ([place]), ESRD (end stage renal disease) on dialysis [facility]), GI bleed, History of blood transfusion (03/2021), Hypertension, Insomnia, Mitral regurgitation (05/16/2021), PAF (paroxysmal atrial fibrillation) ([place]), Pneumonia, Positive QuantiFERON-TB Gold test (05/16/2021), Stroke ([place]), TB lung, latent (05/16/2021), and Vaccine counseling (11/04/2021). ? ? ?Significant Hospital Events: ?Including procedures, antibiotic start and stop dates in addition to other pertinent events   ?3/7 presented for AV graft placement, hypotensive, hypoglycemic post-procedure, admit to PCCM ? ?Interim History / Subjective:  ? ?No acute events overnight ?Weaned off dopamine ?Blood sugars remain 80-100s on D10 drip ?No acute complaints at this time ? ?Objective   ?Blood pressure 113/66, pulse 78, temperature (!) 96.9 ?F (36.1 ?C), temperature source Axillary, resp. rate 14, height 5\' 7"  (1.702 m), weight 65.8 kg, SpO2 (!) 37 %. ?   ?   ? ?Intake/Output Summary (Last 24 hours) at 11/27/2021 0748 ?Last data filed at 11/27/2021 0700 ?Gross per 24 hour  ?Intake 2004.45 ml  ?Output 0 ml  ?Net 2004.45 ml  ? ?Filed  Weights  ? 11/25/21 1205 11/26/21 0727  ?Weight: 66.9 kg 65.8 kg  ? ?General:  chronically ill appearing male, resting in bed ?HEENT: MM pink/moist, sclera anicteric  ?Neuro: awake, alert and oriented, moving all extremities ?CV: s1s2 RRR, no m/r/g, R chest implanted port ?PULM:  scattered wheezing bilaterally ?GI: soft, non-tender, bowel sounds present ?Extremities: warm/dry, no edema  ?Skin: no rashes or lesions ? ?Resolved Hospital Problem list   ? ? ?Assessment & Plan:  ? ?Acute Hypotension and Encephalopathy, likely related to sedation  ?- resolved ? ?Hypoglycemia ?In setting of NPO status ?-continue D10 gtt, wean after resuming diet ?- Resume diet today ?-q4hr glucose checks ? ??Underlying Obstructive lung Disease with exacerbation ?Wheezing on exam today, recent former smoker ?- will need outpatient follow up with PFTs for formal diagnosis ?- Continue dulera inhaler twice daily ?- start prednisone 40mg  daily x 5 days ? ?Chronic Anemia ?-type and screen ?-trend and transfuse for Hgb <7 ? ?HTN  ?Chronic HFpEF  ?Atrial Fibrillation ?-hold home Coreg, not anti-coagulated per home meds ?-telemetry monitoring, labs pending ?-EKG  ? ?Latent TB ?-continue Isoniazid ? ?Dementia ?-hold home Namenda  ? ?Best Practice (right click and "Reselect all SmartList Selections" daily)  ? ?Diet/type: Regular consistency (see orders) ?DVT prophylaxis: prophylactic heparin  ?GI prophylaxis: N/A ?Lines: n/a ?Foley:  N/A ?Code Status:  full code ?Last date of multidisciplinary goals of care discussion [pending] ? ?Labs   ?CBC: ?Recent Labs  ?Lab 11/26/21 ?0820 11/26/21 ?1842  ?WBC  --  11.7*  ?HGB 7.1* 8.7*  ?HCT 21.0* 28.1*  ?MCV  --  89.2  ?  PLT  --  266  ? ? ?Basic Metabolic Panel: ?Recent Labs  ?Lab 11/26/21 ?0820 11/26/21 ?1842  ?NA 142 133*  ?K 2.8* 4.3  ?CL 106 95*  ?CO2  --  21*  ?GLUCOSE 59* 96  ?BUN 34* 52*  ?CREATININE 5.50* 7.26*  ?CALCIUM  --  8.2*  ?MG  --  2.0  ? ?GFR: ?Estimated Creatinine Clearance: 7.8 mL/min (A) (by  C-G formula based on SCr of 7.26 mg/dL (H)). ?Recent Labs  ?Lab 11/26/21 ?1842  ?WBC 11.7*  ?LATICACIDVEN 1.7  ? ? ?Liver Function Tests: ?Recent Labs  ?Lab 11/26/21 ?1842  ?AST 12*  ?ALT 5  ?ALKPHOS 70  ?BILITOT 0.5  ?PROT 7.0  ?ALBUMIN 3.4*  ? ?No results for input(s): LIPASE, AMYLASE in the last 168 hours. ?No results for input(s): AMMONIA in the last 168 hours. ? ?ABG ?   ?Component Value Date/Time  ? PHART 7.441 08/17/2021 1919  ? PCO2ART 42.4 08/17/2021 1919  ? PO2ART 79.3 (L) 08/17/2021 1919  ? HCO3 28.4 (H) 08/17/2021 1919  ? TCO2 21 (L) 11/26/2021 0820  ? O2SAT 94.1 08/17/2021 1919  ?  ? ?Coagulation Profile: ?No results for input(s): INR, PROTIME in the last 168 hours. ? ?Cardiac Enzymes: ?No results for input(s): CKTOTAL, CKMB, CKMBINDEX, TROPONINI in the last 168 hours. ? ?HbA1C: ?No results found for: HGBA1C ? ?CBG: ?Recent Labs  ?Lab 11/26/21 ?2301 11/27/21 ?0251 11/27/21 ?6606 11/27/21 ?0045 11/27/21 ?9977  ?GLUCAP 109* 73 113* 117* 99  ? ?Critical care time:  n/a ?  ? ?Freda Jackson, MD ?[facility] ?Office: [phone] ? ? ?See Amion for personal pager ?PCCM on call pager [phone] until 7pm. ?Please call Elink 7p-7a. [phone] ? ? ?

## 2021-11-27 NOTE — Progress Notes (Addendum)
[facility] [place] ? ?VASCULAR SURGERY ASSESSMENT & PLAN:  ? ?S/P RIGHT UPPER ARM AV GRAFT: His graft is patent.  He has a palpable radial pulse on the right with no symptoms of steal.  He has mild swelling as expected.  He should elevate his arm.  We will arrange follow-up in 2 weeks.  ? ?Gae Gallop, MD ?9:57 AM ? ? ?Subjective  - Right arm sore. ? ? ?Objective ?113/66 ?78 ?99 ?F (37.2 ?C) (Axillary) ?14 ?(!) 37% ? ?Intake/Output Summary (Last 24 hours) at 11/27/2021 0820 ?Last data filed at 11/27/2021 0700 ?Gross per 24 hour  ?Intake 2004.45 ml  ?Output 0 ml  ?Net 2004.45 ml  ? ? ?Palpable thrill at graft anastomosis, radial/ulnar and right graft  doppler signal ?Incision healing well, edema and firness to tissue around graft tunnel, posterior upper arm and forearm softer.  Possible mild gore reaction. ?Grip intact and equal B UE ?Lungs non labored breathing ? ?Assessment/Planning: ?POD # 1 right UE AV graft placement ? ?Post op hypotension requiring admission for BP management by CCM.  This has improved and he is now off pressors. BP stable 113/66.   ?HGB 7.1 with chronic anemia due to ESRD EBL minimal ?General No acute distress a & O x 3 ?Plan for VVS f/u PRN the graft may be used in 4 weeks. ? ?Roxy Horseman ?11/27/2021 ?8:20 AM ?-- ? ?Laboratory ?Lab Results: ?Recent Labs  ?  11/26/21 ?0820 11/26/21 ?1842  ?WBC  --  11.7*  ?HGB 7.1* 8.7*  ?HCT 21.0* 28.1*  ?PLT  --  266  ? ?BMET ?Recent Labs  ?  11/26/21 ?0820 11/26/21 ?1842  ?NA 142 133*  ?K 2.8* 4.3  ?CL 106 95*  ?CO2  --  21*  ?GLUCOSE 59* 96  ?BUN 34* 52*  ?CREATININE 5.50* 7.26*  ?CALCIUM  --  8.2*  ? ? ?COAG ?Lab Results  ?Component Value Date  ? INR 1.2 10/12/2021  ? INR 1.3 (H) 09/30/2021  ? ?No results found for: PTT ? ? ? ?

## 2021-11-27 NOTE — Progress Notes (Signed)
?  Transition of Care (TOC) Screening Note ? ? ?Patient Details  ?Name: Anthony [name] ?Date of Birth: 1940/11/07 ? ? ?Transition of Care (TOC) CM/SW Contact:    ?Benard Halsted, LCSW ?Phone Number: ?11/27/2021, 9:50 AM ? ? ? ?Transition of Care Department [facility]) has reviewed patient. Patient arrives to the hospital from [facility] where he was receiving rehab. We will continue to monitor patient advancement through interdisciplinary progression rounds. If new patient transition needs arise, please place a TOC consult. ? ? ?

## 2021-11-28 ENCOUNTER — Telehealth: Payer: Medicare Other

## 2021-11-28 DIAGNOSIS — Z227 Latent tuberculosis: Secondary | ICD-10-CM

## 2021-11-28 DIAGNOSIS — I959 Hypotension, unspecified: Secondary | ICD-10-CM | POA: Diagnosis not present

## 2021-11-28 DIAGNOSIS — N186 End stage renal disease: Secondary | ICD-10-CM | POA: Diagnosis not present

## 2021-11-28 DIAGNOSIS — M79671 Pain in right foot: Secondary | ICD-10-CM

## 2021-11-28 DIAGNOSIS — I70223 Atherosclerosis of native arteries of extremities with rest pain, bilateral legs: Secondary | ICD-10-CM

## 2021-11-28 DIAGNOSIS — Z992 Dependence on renal dialysis: Secondary | ICD-10-CM

## 2021-11-28 DIAGNOSIS — E162 Hypoglycemia, unspecified: Secondary | ICD-10-CM | POA: Diagnosis not present

## 2021-11-28 LAB — RENAL FUNCTION PANEL
Albumin: 2.8 g/dL — ABNORMAL LOW (ref 3.5–5.0)
Anion gap: 17 — ABNORMAL HIGH (ref 5–15)
BUN: 81 mg/dL — ABNORMAL HIGH (ref 8–23)
CO2: 19 mmol/L — ABNORMAL LOW (ref 22–32)
Calcium: 8.3 mg/dL — ABNORMAL LOW (ref 8.9–10.3)
Chloride: 90 mmol/L — ABNORMAL LOW (ref 98–111)
Creatinine, Ser: 10.13 mg/dL — ABNORMAL HIGH (ref 0.61–1.24)
GFR, Estimated: 5 mL/min — ABNORMAL LOW (ref >60)
Glucose, Bld: 116 mg/dL — ABNORMAL HIGH (ref 70–99)
Phosphorus: 9.5 mg/dL — ABNORMAL HIGH (ref 2.5–4.6)
Potassium: 6 mmol/L — ABNORMAL HIGH (ref 3.5–5.1)
Sodium: 126 mmol/L — ABNORMAL LOW (ref 135–145)

## 2021-11-28 LAB — GLUCOSE, CAPILLARY
Glucose-Capillary: 83 mg/dL (ref 70–99)
Glucose-Capillary: 90 mg/dL (ref 70–99)
Glucose-Capillary: 102 mg/dL — ABNORMAL HIGH (ref 70–99)
Glucose-Capillary: 65 mg/dL — ABNORMAL LOW (ref 70–99)
Glucose-Capillary: 99 mg/dL (ref 70–99)
Glucose-Capillary: 218 mg/dL — ABNORMAL HIGH (ref 70–99)
Glucose-Capillary: 100 mg/dL — ABNORMAL HIGH (ref 70–99)
Glucose-Capillary: 119 mg/dL — ABNORMAL HIGH (ref 70–99)

## 2021-11-28 LAB — HEPATITIS B CORE ANTIBODY, TOTAL: Hep B Core Total Ab: REACTIVE — AB

## 2021-11-28 LAB — CBC
HCT: 24.1 % — ABNORMAL LOW (ref 39.0–52.0)
Hemoglobin: 7.6 g/dL — ABNORMAL LOW (ref 13.0–17.0)
MCH: 26.8 pg (ref 26.0–34.0)
MCHC: 31.5 g/dL (ref 30.0–36.0)
MCV: 84.9 fL (ref 80.0–100.0)
Platelets: 301 10*3/uL (ref 150–400)
RBC: 2.84 MIL/uL — ABNORMAL LOW (ref 4.22–5.81)
RDW: 19.2 % — ABNORMAL HIGH (ref 11.5–15.5)
WBC: 12.5 10*3/uL — ABNORMAL HIGH (ref 4.0–10.5)
nRBC: 0.4 % — ABNORMAL HIGH (ref 0.0–0.2)

## 2021-11-28 LAB — RESP PANEL BY RT-PCR (FLU A&B, COVID) ARPGX2
Influenza A by PCR: NEGATIVE
Influenza B by PCR: NEGATIVE
SARS Coronavirus 2 by RT PCR: NEGATIVE

## 2021-11-28 LAB — HEPATITIS B SURFACE ANTIGEN: Hepatitis B Surface Ag: NONREACTIVE

## 2021-11-28 MED ORDER — ALTEPLASE 2 MG IJ SOLR
4.0000 mg | Freq: Once | INTRAMUSCULAR | Status: AC
  Administered 2021-11-28: 4 mg
  Filled 2021-11-28: qty 4

## 2021-11-28 MED ORDER — CHLORHEXIDINE GLUCONATE CLOTH 2 % EX PADS
6.0000 | MEDICATED_PAD | Freq: Every day | CUTANEOUS | Status: DC
  Administered 2021-11-28 – 2021-11-30 (×3): 6 via TOPICAL

## 2021-11-28 MED ORDER — UMECLIDINIUM BROMIDE 62.5 MCG/ACT IN AEPB
1.0000 | INHALATION_SPRAY | Freq: Every day | RESPIRATORY_TRACT | Status: DC
  Administered 2021-11-29 – 2021-12-01 (×2): 1 via RESPIRATORY_TRACT
  Filled 2021-11-28: qty 7

## 2021-11-28 MED ORDER — VITAMIN B-6 50 MG PO TABS
50.0000 mg | ORAL_TABLET | Freq: Every day | ORAL | Status: DC
  Administered 2021-11-29 – 2021-12-01 (×3): 50 mg via ORAL
  Filled 2021-11-28 (×3): qty 1

## 2021-11-28 MED ORDER — TIOTROPIUM BROMIDE MONOHYDRATE 2.5 MCG/ACT IN AERS: 2.0000 | INHALATION_SPRAY | Freq: Every day | RESPIRATORY_TRACT | Status: DC

## 2021-11-28 MED ORDER — GABAPENTIN 100 MG PO CAPS
100.0000 mg | ORAL_CAPSULE | Freq: Every day | ORAL | Status: DC
  Administered 2021-11-28: 23:00:00 100 mg via ORAL
  Filled 2021-11-28: qty 1

## 2021-11-28 MED ORDER — DEXTROSE 50 % IV SOLN: 1.0000 | INTRAVENOUS | Status: DC | PRN

## 2021-11-28 NOTE — Assessment & Plan Note (Addendum)
-  vascular consult: s/p angiogram: Best medical therapy for PAD. No options for revascularization. Only option for pain control is AKA or BKA ?-resumed neurontin- increased dose (max dose 300mg ) ?-add PRN norco ?

## 2021-11-28 NOTE — Consult Note (Signed)
Renal Service ?Consult Note ?[facility] ? ?Anthony [name] ?11/28/2021 ?[name], MD ?Requesting Physician: Dr Eliseo Squires ? ?Reason for Consult: ESRD pt w/ new R arm AVG placed yesterday f/b comlications ?HPI: The patient is a 81 y.o. year-old w/ hx of CAD, dementia, ESRD on HD, GIB, HTN, latent TB, atrial fib, HTN, CVA who was admitted 3/07 for placement of RUE AVG by Dr Scot Dock. Post procedure pt became hypolycemic w/ hypotension and bradycardia. Was moved to ICU, rec'd IV dopamine gtt, LR bolus, D10 infusion, IV albumin, prednisone and several amps of D50. Coreg was held. Pt has stabilized and is eating solid foods w/o BS problems, off dopamine gtt and VSS. Asked to see for dialysis.  ? ?Pt seen in ICU, not much of a historian.  Pleasant.  No CP or SOB.  ? ? ?ROS - denies CP, no joint pain, no HA, no blurry vision, no rash, no diarrhea, no nausea/ vomiting, no dysuria, no difficulty voiding ? ? ?Past Medical History  ?Past Medical History:  ?Diagnosis Date  ? Alcohol abuse 2021  ? Anemia   ? Arthritis   ? CAD (coronary artery disease)   ? CHF (congestive heart failure) ([place])   ? Chicken pox   ? COVID 02/2021  ? Dementia ([place])   ? mild  ? ESRD (end stage renal disease) on dialysis [facility])   ? GI bleed   ? History of blood transfusion 03/2021  ? Hypertension   ? Insomnia   ? Mitral regurgitation 05/16/2021  ? PAF (paroxysmal atrial fibrillation) ([place])   ? Pneumonia   ? Positive QuantiFERON-TB Gold test 05/16/2021  ? Stroke [facility])   ? TB lung, latent 05/16/2021  ? Vaccine counseling 11/04/2021  ? ?Past Surgical History  ?Past Surgical History:  ?Procedure Laterality Date  ? AV FISTULA PLACEMENT Right 05/14/2021  ? Procedure: INSERTION OF ARTERIOVENOUS (AV) GORE-TEX GRAFT ARM;  Surgeon: Angelia Mould, MD;  Location: [facility] OR;  Service: Vascular;  Laterality: Right;  ? AV FISTULA PLACEMENT Right 11/26/2021  ? Procedure: INSERTION OF RIGHT UPPER ARM ARTERIOVENOUS (AV) GORE-TEX STRETCH GRAFT (4-70mmx45cm);   Surgeon: Angelia Mould, MD;  Location: [facility] OR;  Service: Vascular;  Laterality: Right;  ? ENTEROSCOPY N/A 04/18/2021  ? Procedure: ENTEROSCOPY;  Surgeon: Jackquline Denmark, MD;  Location: [facility] ENDOSCOPY;  Service: Endoscopy;  Laterality: N/A;  ? HOT HEMOSTASIS N/A 04/18/2021  ? Procedure: HOT HEMOSTASIS (ARGON PLASMA COAGULATION/BICAP);  Surgeon: Jackquline Denmark, MD;  Location: [facility] ENDOSCOPY;  Service: Endoscopy;  Laterality: N/A;  ? SUBMUCOSAL TATTOO INJECTION  04/18/2021  ? Procedure: SUBMUCOSAL TATTOO INJECTION;  Surgeon: Jackquline Denmark, MD;  Location: [facility] ENDOSCOPY;  Service: Endoscopy;;  ? ?Family History  ?Family History  ?Problem Relation Age of Onset  ? Hypertension Mother   ? Hypertension Father   ? ?Social History  reports that he has been smoking cigarettes. He has a 15.25 pack-year smoking history. He has never used smokeless tobacco. He reports that he does not currently use alcohol. He reports that he does not currently use drugs. ?Allergies No Known Allergies ?Home medications ?Prior to Admission medications   ?Medication Sig Start Date End Date Taking? Authorizing Provider  ?albuterol (PROVENTIL HFA) 108 (90 Base) MCG/ACT inhaler Inhale 2 puffs into the lungs every 4 (four) hours as needed for wheezing or shortness of breath. 03/08/20  Yes Laurey Morale, MD  ?aspirin EC 81 MG tablet Take 81 mg by mouth every evening. (1700) Swallow whole.   Yes [provider]  ?atorvastatin (LIPITOR) 20 MG tablet TAKE 1 TABLET BY MOUTH EVERYDAY AT BEDTIME 07/30/20  Yes Isaac Bliss, Rayford Halsted, MD  ?budesonide-formoterol [facility]) 160-4.5 MCG/ACT inhaler Inhale 2 puffs into the lungs in the morning. (0900) 04/12/21  Yes [provider]  ?calcium acetate (PHOSLO) 667 MG capsule Take 667 mg by mouth 3 (three) times daily after meals. (1000, 1300 & 1800)   Yes [provider]  ?carboxymethylcellulose (REFRESH PLUS) 0.5 % SOLN Place 1 drop into both eyes daily as needed (dry eyes). 12/07/20  Yes  [provider]  ?carvedilol (COREG) 6.25 MG tablet Take 1 tablet (6.25 mg total) by mouth 2 (two) times daily with a meal. 03/02/20  Yes Black, Lezlie Octave, NP  ?gabapentin (NEURONTIN) 100 MG capsule Take 100 mg by mouth at bedtime. (2100)   Yes [provider]  ?isoniazid (NYDRAZID) 300 MG tablet TAKE 1 TABLET BY MOUTH EVERY DAY 07/26/21  Yes Tommy Medal, Lavell Islam, MD  ?memantine (NAMENDA) 5 MG tablet Take 5 mg by mouth at bedtime. (2100) 03/12/21  Yes [provider]  ?nicotine (NICODERM CQ - DOSED IN MG/24 HOURS) 14 mg/24hr patch Place 1 patch (14 mg total) onto the skin daily. 11/05/21 11/05/22 Yes Sheikh, Omair Latif, DO  ?oxyCODONE-acetaminophen (PERCOCET) 5-325 MG tablet Take 1 tablet by mouth every 6 (six) hours as needed. 11/26/21  Yes Rhyne, Hulen Shouts, PA-C  ?pantoprazole (PROTONIX) 40 MG tablet Take 1 tablet (40 mg total) by mouth daily. 04/24/21  Yes Mariel Aloe, MD  ?pyridOXINE (VITAMIN B-6) 50 MG tablet Take 1 tablet (50 mg total) by mouth daily. 05/16/21 02/10/22 Yes Truman Hayward, MD  ?Tiotropium Bromide Monohydrate (SPIRIVA RESPIMAT) 2.5 MCG/ACT AERS Inhale 2 puffs into the lungs daily. 03/15/20  Yes Isaac Bliss, Rayford Halsted, MD  ?traZODone (DESYREL) 50 MG tablet Take 1-2 tablets (50-100 mg total) by mouth at bedtime as needed. 06/12/21  Yes Erline Hau, MD  ? ? ? ?Vitals:  ? 11/28/21 0500 11/28/21 0600 11/28/21 0742 11/28/21 0800  ?BP:    (!) 108/56  ?Pulse: 80 81  77  ?Resp: (!) 22 14  15   ?Temp:    98 ?F (36.7 ?C)  ?TempSrc:    Axillary  ?SpO2: 96% (!) 88% (!) 87% 99%  ?Weight:      ?Height:      ? ?Exam ?Gen alert, no distress ?No rash, cyanosis or gangrene ?Sclera anicteric, throat clear  ?No jvd or bruits ?Chest no rales, bilat mild upper airway rhonchi ?RRR no RG ?Abd soft ntnd no mass or ascites +bs ?GU normal male ?MS no joint effusions or deformity ?Ext no LE or UE edema, no wounds or ulcers ?Neuro is alert, Ox 3 , nf ? ? ? ? ? Home meds include -  asa, lipitor, symbicort, phoslo, coreg 6.25 bid, gabapentin, isoniazid, namenda, nicotine patch, protonix, spiriva, desyrel, prns ? ? ? ? OP HD: [place] MWF ? 4h  400/800   TDC RIJ / new RUE AVG placed 3/08 Hep 3000 ? ? ?Assessment/ Plan: ?Hypoglycemia - resolved, off of D10, eating solid food ?Bradycardia - sp dopamine gtt, HR 70's today ?ESRD - on HD MWF. Plan HD today upstairs off schedule.  ?Dementia - mild, cont meds ?PAF - on coreg at home, on hold here.  ?Latent TB - taking isoniazid ?Hx CVA ?Anemia ckd - Hb 8.7, get records ?MBD ckd - Ca in range, cont phoslo ac ?  ? ? ? ?Rob Doctor, hospital  MD ?11/28/2021, 8:59 AM ?Recent Labs  ?Lab 11/26/21 ?0820 11/26/21 ?1842  ?HGB 7.1* 8.7*  ?ALBUMIN  --  3.4*  ?CALCIUM  --  8.2*  ?CREATININE 5.50* 7.26*  ?K 2.8* 4.3  ? ?] ?

## 2021-11-28 NOTE — Progress Notes (Signed)
Patient's CBG was 65. This RN gave the patient 4 oz of orange juice. Will get another blood sugar in 15 minutes. ? ?Normajean Baxter, RN ?

## 2021-11-28 NOTE — Progress Notes (Signed)
Rechecked CBG post administering juice and the patient's CBG has risen to 106. No new interventions at this time.  ? ?Normajean Baxter, RN ?

## 2021-11-28 NOTE — Assessment & Plan Note (Addendum)
-  appears resolved ?-PRN D50 ?

## 2021-11-28 NOTE — Assessment & Plan Note (Signed)
Acute Hypotension and Encephalopathy, likely related to sedation  ?- resolved ?

## 2021-11-28 NOTE — Assessment & Plan Note (Signed)
-  delirium precautions  ?

## 2021-11-28 NOTE — Progress Notes (Signed)
PROGRESS NOTE    Anthony [name]  BOF:[id number] DOB: [birth year] DOA: 11/26/2021 PCP: Isaac Bliss, Rayford Halsted, MD    Brief Narrative:  81 year old male with end-stage renal disease on hemodialysis who presented today for right upper extremity AV graft, he tolerated procedure well, postprocedure patient was noted to be hypotensive, bradycardic and hypoglycemic.  Patient received multiple doses of D50 ampules and was started on dopamine. Admitted to critical care initially and transferred to [facility] on 3/9.      Assessment and Plan: * Hypotension Acute Hypotension and Encephalopathy, likely related to sedation  - resolved  Foot pain, bilateral -palpable pedal pulses but markedly diminished flow in both feet with the Doppler.  He has a monophasic anterior tibial signal on the left.  I cannot think Doppler signals on the right foot.  I have recommended that we proceed with arteriography tomorrow to further assess his critical limb ischemia. -resumed neurontin  Pressure injury of skin Pressure Injury 11/26/21 Sacrum Mid;Upper Stage 1 -  Intact skin with non-blanchable redness of a localized area usually over a bony prominence. (Active)  11/26/21 1548  Location: Sacrum  Location Orientation: Mid;Upper  Staging: Stage 1 -  Intact skin with non-blanchable redness of a localized area usually over a bony prominence.  Wound Description (Comments):   Present on Admission: Yes       Encephalopathy acute -see hypotension  Hypoglycemia -eating but still having low blood sugars -not on insulin -if continues, may need further work up -PRN D50  TB lung, latent -continue Isoniazid  Dementia without behavioral disturbance (HCC) -delirium precautions   ESRD (end stage renal disease) on dialysis [facility]) -consulted nephrology as he is due for HD -vascular consult: POD 2 S/P RIGHT UPPER ARM AV GRAFT: He has a good thrill in his graft and a palpable radial pulse.  He does have significant swelling  but there is nothing to do for this for now except for elevating the arm.  The graft can be used in 1 month and at that point the catheter can be removed which will likely significantly help with the arm swelling.  If he has persistent swelling, then once the graft can be cannulated in 1 month we could obtain a fistulogram to look for central venous stenosis.         DVT prophylaxis: heparin injection 5,000 Units Start: 11/26/21 2200 SCDs Start: 11/26/21 1414    Code Status: Full Code  Disposition Plan:  Level of care: Progressive Status is: Inpatient Remains inpatient appropriate because: needs vascular procedure   Admitted from SNF- [facility] consulted   Consultants:  PCCm Vascular renal   Subjective: No complaints wants ice  Objective: Vitals:   11/28/21 0900 11/28/21 1000 11/28/21 1100 11/28/21 1200  BP:  (!) 122/59 133/73 136/60  Pulse: 78 75 78 76  Resp: (!) 21 17 11 13   Temp:      TempSrc:      SpO2: 95% 100% 100% 100%  Weight:      Height:        Intake/Output Summary (Last 24 hours) at 11/28/2021 1222 Last data filed at 11/28/2021 0900 Gross per 24 hour  Intake 623.86 ml  Output --  Net 623.86 ml   Filed Weights   11/25/21 1205 11/26/21 0727  Weight: 66.9 kg 65.8 kg    Examination:   General: Appearance:    Elderly male in no acute distress     Lungs:     respirations unlabored  Heart:  Normal heart rate.    MS:   All extremities are intact.    Neurologic:   Awake, alert, cooperative       Data Reviewed: I have personally reviewed following labs and imaging studies  CBC: Recent Labs  Lab 11/26/21 0820 11/26/21 1842  WBC  --  11.7*  HGB 7.1* 8.7*  HCT 21.0* 28.1*  MCV  --  89.2  PLT  --  867   Basic Metabolic Panel: Recent Labs  Lab 11/26/21 0820 11/26/21 1842  NA 142 133*  K 2.8* 4.3  CL 106 95*  CO2  --  21*  GLUCOSE 59* 96  BUN 34* 52*  CREATININE 5.50* 7.26*  CALCIUM  --  8.2*  MG  --  2.0   GFR: Estimated  Creatinine Clearance: 7.8 mL/min (A) (by C-G formula based on SCr of 7.26 mg/dL (H)). Liver Function Tests: Recent Labs  Lab 11/26/21 1842  AST 12*  ALT 5  ALKPHOS 70  BILITOT 0.5  PROT 7.0  ALBUMIN 3.4*   No results for input(s): LIPASE, AMYLASE in the last 168 hours. No results for input(s): AMMONIA in the last 168 hours. Coagulation Profile: No results for input(s): INR, PROTIME in the last 168 hours. Cardiac Enzymes: No results for input(s): CKTOTAL, CKMB, CKMBINDEX, TROPONINI in the last 168 hours. BNP (last 3 results) No results for input(s): PROBNP in the last 8760 hours. HbA1C: No results for input(s): HGBA1C in the last 72 hours. CBG: Recent Labs  Lab 11/27/21 2325 11/28/21 0334 11/28/21 0721 11/28/21 1141 11/28/21 1208  GLUCAP 108* 83 90 65* 102*   Lipid Profile: No results for input(s): CHOL, HDL, LDLCALC, TRIG, CHOLHDL, LDLDIRECT in the last 72 hours. Thyroid Function Tests: No results for input(s): TSH, T4TOTAL, FREET4, T3FREE, THYROIDAB in the last 72 hours. Anemia Panel: No results for input(s): VITAMINB12, FOLATE, FERRITIN, TIBC, IRON, RETICCTPCT in the last 72 hours. Sepsis Labs: Recent Labs  Lab 11/26/21 1842  LATICACIDVEN 1.7    Recent Results (from the past 240 hour(s))  MRSA Next Gen by PCR, Nasal     Status: None   Collection Time: 11/26/21  3:43 PM   Specimen: Nasal Mucosa; Nasal Swab  Result Value Ref Range Status   MRSA by PCR Next Gen NOT DETECTED NOT DETECTED Final    Comment: (NOTE) The GeneXpert MRSA Assay (FDA approved for NASAL specimens only), is one component of a comprehensive MRSA colonization surveillance program. It is not intended to diagnose MRSA infection nor to guide or monitor treatment for MRSA infections. Test performance is not FDA approved in patients less than 54 years old. Performed at [facility], [place] [address] [place],  [address]   Resp Panel by RT-PCR (Flu A&B, Covid) Nasopharyngeal Swab      Status: None   Collection Time: 11/28/21  9:12 AM   Specimen: Nasopharyngeal Swab; Nasopharyngeal(NP) swabs in vial transport medium  Result Value Ref Range Status   SARS Coronavirus 2 by RT PCR NEGATIVE NEGATIVE Final    Comment: (NOTE) SARS-CoV-2 target nucleic acids are NOT DETECTED.  The SARS-CoV-2 RNA is generally detectable in upper respiratory specimens during the acute phase of infection. The lowest concentration of SARS-CoV-2 viral copies this assay can detect is 138 copies/mL. A negative result does not preclude SARS-Cov-2 infection and should not be used as the sole basis for treatment or other patient management decisions. A negative result may occur with  improper specimen collection/handling, submission of specimen other than nasopharyngeal  swab, presence of viral mutation(s) within the areas targeted by this assay, and inadequate number of viral copies(<138 copies/mL). A negative result must be combined with clinical observations, patient history, and epidemiological information. The expected result is Negative.  Fact Sheet for Patients:  EntrepreneurPulse.com.au  Fact Sheet for Healthcare Providers:  IncredibleEmployment.be  This test is no t yet approved or cleared by the [place] FDA and  has been authorized for detection and/or diagnosis of SARS-CoV-2 by FDA under an Emergency Use Authorization (EUA). This EUA will remain  in effect (meaning this test can be used) for the duration of the COVID-19 declaration under Section 564(b)(1) of the Act, 21 U.S.C.section 360bbb-3(b)(1), unless the authorization is terminated  or revoked sooner.       Influenza A by PCR NEGATIVE NEGATIVE Final   Influenza B by PCR NEGATIVE NEGATIVE Final    Comment: (NOTE) The Xpert Xpress SARS-CoV-2/FLU/RSV plus assay is intended as an aid in the diagnosis of influenza from Nasopharyngeal swab specimens and should not be used as a sole basis  for treatment. Nasal washings and aspirates are unacceptable for Xpert Xpress SARS-CoV-2/FLU/RSV testing.  Fact Sheet for Patients: EntrepreneurPulse.com.au  Fact Sheet for Healthcare Providers: IncredibleEmployment.be  This test is not yet approved or cleared by the [place] FDA and has been authorized for detection and/or diagnosis of SARS-CoV-2 by FDA under an Emergency Use Authorization (EUA). This EUA will remain in effect (meaning this test can be used) for the duration of the COVID-19 declaration under Section 564(b)(1) of the Act, 21 U.S.C. section 360bbb-3(b)(1), unless the authorization is terminated or revoked.  Performed at [facility], [place] [address], [place], [place] [address]          Radiology Studies: DG CHEST PORT 1 VIEW  Result Date: 11/26/2021 CLINICAL DATA:  Hypoxia. Status post right upper arm AV graft placement. EXAM: PORTABLE CHEST 1 VIEW COMPARISON:  Chest x-ray dated November 04, 2021. FINDINGS: Unchanged tunneled right internal jugular dialysis catheter. Stable cardiomegaly and mild bibasilar atelectasis/scarring. Normal pulmonary vascularity. No focal consolidation, pleural effusion, or pneumothorax. No acute osseous abnormality. IMPRESSION: 1. Stable bibasilar atelectasis/scarring. Electronically Signed   By: Titus Dubin M.D.   On: 11/26/2021 15:01        Scheduled Meds:  Chlorhexidine Gluconate Cloth  6 each Topical Q0600   gabapentin  100 mg Oral QHS   heparin  5,000 Units Subcutaneous Q8H   isoniazid  300 mg Oral Daily   mometasone-formoterol  2 puff Inhalation BID   predniSONE  40 mg Oral Q breakfast   pyridOXINE  50 mg Oral Daily   Tiotropium Bromide Monohydrate  2 puff Inhalation Daily   Continuous Infusions:   LOS: 2 days    Time spent: 75 minutes spent on chart review, discussion with nursing staff, consultants, updating family and interview/physical exam; more than 50% of that  time was spent in counseling and/or coordination of care.    Geradine Girt, DO [facility] Available via Epic secure chat 7am-7pm After these hours, please refer to coverage provider listed on [email or web address] 11/28/2021, 12:22 PM

## 2021-11-28 NOTE — Assessment & Plan Note (Signed)
-  continue Isoniazid ?

## 2021-11-28 NOTE — Assessment & Plan Note (Addendum)
-  see hypotension ?-resolved after BP And blood sugar fixed ?

## 2021-11-28 NOTE — Assessment & Plan Note (Addendum)
-  consulted nephrology as he is due for HD ?-vascular consult: POD 2 S/P RIGHT UPPER ARM AV GRAFT: He has a good thrill in his graft and a palpable radial pulse.  He does have significant swelling but there is nothing to do for this for now except for elevating the arm.  The graft can be used in 1 month and at that point the catheter can be removed which will likely significantly help with the arm swelling.  If he has persistent swelling, then once the graft can be cannulated in 1 month we could obtain a fistulogram to look for central venous stenosis. ?

## 2021-11-28 NOTE — Progress Notes (Addendum)
? ?  VASCULAR SURGERY ASSESSMENT & PLAN:  ? ?POD 2 S/P RIGHT UPPER ARM AV GRAFT: He has a good thrill in his graft and a palpable radial pulse.  He does have significant swelling but there is nothing to do for this for now except for elevating the arm.  The graft can be used in 1 month and at that point the catheter can be removed which will likely significantly help with the arm swelling.  If he has persistent swelling, then once the graft can be cannulated in 1 month we could obtain a fistulogram to look for central venous stenosis. ? ?SEVERE PAIN IN BOTH FEET: He has palpable pedal pulses but markedly diminished flow in both feet with the Doppler.  He has a monophasic anterior tibial signal on the left.  I cannot think Doppler signals on the right foot.  I have recommended that we proceed with arteriography tomorrow to further assess his critical limb ischemia. I have reviewed with the patient the indications for arteriography. In addition, I have reviewed the potential complications of arteriography including but not limited to: Bleeding, arterial injury, arterial thrombosis, dye reaction or other unpredictable medical problems. I have explained to the patient that if we find disease amenable to angioplasty we could potentially address this at the same time. I have discussed the potential complications of angioplasty and stenting, including but not limited to: Bleeding, arterial thrombosis, arterial injury, dissection, or the need for surgical intervention. ? ?I have written preop orders. ? ?END-STAGE RENAL DISEASE: Please dialyze the patient today if possible as he is scheduled for a procedure tomorrow.  Thank you. ? ?SUBJECTIVE:  ? ?Complains of severe pain in both feet.  He says, "I wish she would just take my legs off." ? ?PHYSICAL EXAM:  ? ?Vitals:  ? 11/28/21 0500 11/28/21 0600 11/28/21 0742 11/28/21 0800  ?BP:      ?Pulse: 80 81    ?Resp: (!) 22 14    ?Temp:    98 ?F (36.7 ?C)  ?TempSrc:    Axillary   ?SpO2: 96% (!) 88% (!) 87%   ?Weight:      ?Height:      ? ?His graft has a palpable thrill. ?He has a palpable right radial pulse. ?He has moderate swelling in the right arm. ?He has palpable femoral pulses. ? ?LABS:  ? ?Lab Results  ?Component Value Date  ? WBC 11.7 (H) 11/26/2021  ? HGB 8.7 (L) 11/26/2021  ? HCT 28.1 (L) 11/26/2021  ? MCV 89.2 11/26/2021  ? PLT 266 11/26/2021  ? ?Lab Results  ?Component Value Date  ? CREATININE 7.26 (H) 11/26/2021  ? ?Lab Results  ?Component Value Date  ? INR 1.2 10/12/2021  ? ?CBG (last 3)  ?Recent Labs  ?  11/27/21 ?2325 11/28/21 ?6269 11/28/21 ?4854  ?GLUCAP 108* 83 90  ? ? ?PROBLEM LIST:   ? ?Principal Problem: ?  Hypotension ?Active Problems: ?  Hypoglycemia ?  Hypoxia ?  Encephalopathy acute ?  Pressure injury of skin ? ? ?CURRENT MEDS:  ? ? Chlorhexidine Gluconate Cloth  6 each Topical Daily  ? heparin  5,000 Units Subcutaneous Q8H  ? isoniazid  300 mg Oral Daily  ? mometasone-formoterol  2 puff Inhalation BID  ? predniSONE  40 mg Oral Q breakfast  ? ? ?Deitra Mayo ?Office: [phone] ?11/28/2021 ? ?

## 2021-11-28 NOTE — Hospital Course (Addendum)
81 year old male with end-stage renal disease on hemodialysis who presented today for right upper extremity AV graft, he tolerated procedure well, postprocedure patient was noted to be hypotensive, bradycardic and hypoglycemic.  Patient received multiple doses of D50 ampules and was started on dopamine. Admitted to critical care initially and transferred to [facility] on 3/9.  Underwent aortogram/angiogram on 3/10.  From SNF ?

## 2021-11-28 NOTE — Procedures (Signed)
? ?  I was present at this dialysis session, have reviewed the session itself and made  appropriate changes ?Kelly Splinter MD ?[employer] ?pager [phone]   ?11/28/2021, 2:00 PM ? ? ?

## 2021-11-28 NOTE — Assessment & Plan Note (Signed)
Pressure Injury 11/26/21 Sacrum Mid;Upper Stage 1 -  Intact skin with non-blanchable redness of a localized area usually over a bony prominence. (Active)  ?11/26/21 1548  ?Location: Sacrum  ?Location Orientation: Mid;Upper  ?Staging: Stage 1 -  Intact skin with non-blanchable redness of a localized area usually over a bony prominence.  ?Wound Description (Comments):   ?Present on Admission: Yes  ? ? ? ? ?

## 2021-11-29 ENCOUNTER — Inpatient Hospital Stay (HOSPITAL_COMMUNITY)
Admission: RE | Disposition: A | Discharge status: Skilled Nursing Facility | Payer: Self-pay | Source: Home / Self Care | Attending: Internal Medicine

## 2021-11-29 ENCOUNTER — Encounter (HOSPITAL_COMMUNITY): Payer: Self-pay | Admitting: Vascular Surgery

## 2021-11-29 DIAGNOSIS — I70223 Atherosclerosis of native arteries of extremities with rest pain, bilateral legs: Secondary | ICD-10-CM

## 2021-11-29 DIAGNOSIS — F0390 Unspecified dementia without behavioral disturbance: Secondary | ICD-10-CM

## 2021-11-29 HISTORY — PX: ABDOMINAL AORTOGRAM W/LOWER EXTREMITY: CATH118223

## 2021-11-29 LAB — BASIC METABOLIC PANEL
Anion gap: 15 (ref 5–15)
BUN: 46 mg/dL — ABNORMAL HIGH (ref 8–23)
CO2: 25 mmol/L (ref 22–32)
Calcium: 8.4 mg/dL — ABNORMAL LOW (ref 8.9–10.3)
Chloride: 96 mmol/L — ABNORMAL LOW (ref 98–111)
Creatinine, Ser: 6.69 mg/dL — ABNORMAL HIGH (ref 0.61–1.24)
GFR, Estimated: 8 mL/min — ABNORMAL LOW (ref >60)
Glucose, Bld: 88 mg/dL (ref 70–99)
Potassium: 4.2 mmol/L (ref 3.5–5.1)
Sodium: 136 mmol/L (ref 135–145)

## 2021-11-29 LAB — CBC
HCT: 24.2 % — ABNORMAL LOW (ref 39.0–52.0)
Hemoglobin: 7.5 g/dL — ABNORMAL LOW (ref 13.0–17.0)
MCH: 26.7 pg (ref 26.0–34.0)
MCHC: 31 g/dL (ref 30.0–36.0)
MCV: 86.1 fL (ref 80.0–100.0)
Platelets: 287 10*3/uL (ref 150–400)
RBC: 2.81 MIL/uL — ABNORMAL LOW (ref 4.22–5.81)
RDW: 19.5 % — ABNORMAL HIGH (ref 11.5–15.5)
WBC: 13.5 10*3/uL — ABNORMAL HIGH (ref 4.0–10.5)
nRBC: 1.7 % — ABNORMAL HIGH (ref 0.0–0.2)

## 2021-11-29 LAB — GLUCOSE, CAPILLARY
Glucose-Capillary: 101 mg/dL — ABNORMAL HIGH (ref 70–99)
Glucose-Capillary: 103 mg/dL — ABNORMAL HIGH (ref 70–99)
Glucose-Capillary: 102 mg/dL — ABNORMAL HIGH (ref 70–99)
Glucose-Capillary: 151 mg/dL — ABNORMAL HIGH (ref 70–99)

## 2021-11-29 LAB — HEPATITIS B SURFACE ANTIBODY, QUANTITATIVE: Hep B S AB Quant (Post): <3.1 m[IU]/mL — ABNORMAL LOW (ref >9.9)

## 2021-11-29 SURGERY — ABDOMINAL AORTOGRAM W/LOWER EXTREMITY
Anesthesia: LOCAL | Laterality: Bilateral

## 2021-11-29 MED ORDER — MIDAZOLAM HCL 2 MG/2ML IJ SOLN
INTRAMUSCULAR | Status: AC
  Filled 2021-11-29: qty 2

## 2021-11-29 MED ORDER — HEPARIN SODIUM (PORCINE) 5000 UNIT/ML IJ SOLN
5000.0000 [IU] | Freq: Three times a day (TID) | INTRAMUSCULAR | Status: DC
  Administered 2021-11-29 – 2021-12-01 (×5): 5000 [IU] via SUBCUTANEOUS
  Filled 2021-11-29 (×5): qty 1

## 2021-11-29 MED ORDER — ONDANSETRON HCL 4 MG/2ML IJ SOLN: 4.0000 mg | Freq: Four times a day (QID) | INTRAMUSCULAR | Status: DC | PRN

## 2021-11-29 MED ORDER — HEPARIN SODIUM (PORCINE) 1000 UNIT/ML DIALYSIS: 1500.0000 [IU] | INTRAMUSCULAR | Status: DC | PRN

## 2021-11-29 MED ORDER — OXYCODONE HCL 5 MG PO TABS
2.5000 mg | ORAL_TABLET | Freq: Once | ORAL | Status: AC
  Administered 2021-11-29: 2.5 mg via ORAL
  Filled 2021-11-29: qty 1

## 2021-11-29 MED ORDER — HEPARIN (PORCINE) IN NACL 1000-0.9 UT/500ML-% IV SOLN
INTRAVENOUS | Status: DC | PRN
  Administered 2021-11-29 (×2): 500 mL

## 2021-11-29 MED ORDER — FENTANYL CITRATE (PF) 100 MCG/2ML IJ SOLN
INTRAMUSCULAR | Status: DC | PRN
  Administered 2021-11-29: 50 ug via INTRAVENOUS

## 2021-11-29 MED ORDER — MIDAZOLAM HCL 2 MG/2ML IJ SOLN
INTRAMUSCULAR | Status: DC | PRN
  Administered 2021-11-29: 1 mg via INTRAVENOUS

## 2021-11-29 MED ORDER — SODIUM CHLORIDE 0.9 % IV SOLN: 250.0000 mL | INTRAVENOUS | Status: DC | PRN

## 2021-11-29 MED ORDER — HEPARIN (PORCINE) IN NACL 1000-0.9 UT/500ML-% IV SOLN
INTRAVENOUS | Status: AC
  Filled 2021-11-29: qty 1000

## 2021-11-29 MED ORDER — IODIXANOL 320 MG/ML IV SOLN
INTRAVENOUS | Status: DC | PRN
  Administered 2021-11-29: 130 mL

## 2021-11-29 MED ORDER — HYDRALAZINE HCL 20 MG/ML IJ SOLN: 5.0000 mg | INTRAMUSCULAR | Status: DC | PRN

## 2021-11-29 MED ORDER — ATORVASTATIN CALCIUM 10 MG PO TABS
20.0000 mg | ORAL_TABLET | Freq: Every day | ORAL | Status: DC
Start: 2021-11-29 — End: 2021-12-01
  Administered 2021-11-29 – 2021-12-01 (×3): 20 mg via ORAL
  Filled 2021-11-29 (×3): qty 2

## 2021-11-29 MED ORDER — SODIUM CHLORIDE 0.9% FLUSH
3.0000 mL | Freq: Two times a day (BID) | INTRAVENOUS | Status: DC
  Administered 2021-11-29 – 2021-11-30 (×4): 3 mL via INTRAVENOUS

## 2021-11-29 MED ORDER — GABAPENTIN 100 MG PO CAPS
200.0000 mg | ORAL_CAPSULE | Freq: Every day | ORAL | Status: DC
  Administered 2021-11-29 – 2021-11-30 (×2): 200 mg via ORAL
  Filled 2021-11-29 (×2): qty 2

## 2021-11-29 MED ORDER — HEPARIN SODIUM (PORCINE) 1000 UNIT/ML IJ SOLN
INTRAMUSCULAR | Status: AC
  Filled 2021-11-29: qty 10

## 2021-11-29 MED ORDER — FENTANYL CITRATE (PF) 100 MCG/2ML IJ SOLN
INTRAMUSCULAR | Status: AC
  Filled 2021-11-29: qty 2

## 2021-11-29 MED ORDER — HYDROMORPHONE HCL 1 MG/ML IJ SOLN
0.5000 mg | INTRAMUSCULAR | Status: DC | PRN
  Filled 2021-11-29: qty 1

## 2021-11-29 MED ORDER — LIDOCAINE HCL (PF) 1 % IJ SOLN
INTRAMUSCULAR | Status: DC | PRN
Start: 2021-11-29 — End: 2021-11-29
  Administered 2021-11-29: 15 mL

## 2021-11-29 MED ORDER — LABETALOL HCL 5 MG/ML IV SOLN: 10.0000 mg | INTRAVENOUS | Status: DC | PRN

## 2021-11-29 MED ORDER — ACETAMINOPHEN 325 MG PO TABS
650.0000 mg | ORAL_TABLET | ORAL | Status: DC | PRN
  Administered 2021-11-30 (×2): 650 mg via ORAL
  Filled 2021-11-29 (×2): qty 2

## 2021-11-29 MED ORDER — LIDOCAINE HCL (PF) 1 % IJ SOLN
INTRAMUSCULAR | Status: AC
  Filled 2021-11-29: qty 30

## 2021-11-29 MED ORDER — SODIUM CHLORIDE 0.9% FLUSH: 3.0000 mL | INTRAVENOUS | Status: DC | PRN

## 2021-11-29 SURGICAL SUPPLY — 15 items
CATH NAVICROSS ANG 65CM (CATHETERS) IMPLANT
CATH OMNI FLUSH 5F 65CM (CATHETERS) ×1 IMPLANT
CATH STRAIGHT 5FR 65CM (CATHETERS) ×1 IMPLANT
CATHETER NAVICROSS ANG 65CM (CATHETERS) ×2
DEVICE CLOSURE MYNXGRIP 5F (Vascular Products) ×1 IMPLANT
GLIDEWIRE ADV .035X260CM (WIRE) ×1 IMPLANT
GUIDEWIRE ANGLED .035X260CM (WIRE) ×1 IMPLANT
KIT MICROPUNCTURE NIT STIFF (SHEATH) ×1 IMPLANT
KIT PV (KITS) ×2 IMPLANT
SHEATH DESTINATION MP 5FR 45CM (SHEATH) ×1 IMPLANT
SHEATH PINNACLE 5F 10CM (SHEATH) ×1 IMPLANT
SYR MEDRAD MARK 7 150ML (SYRINGE) ×2 IMPLANT
TRANSDUCER W/STOPCOCK (MISCELLANEOUS) ×2 IMPLANT
TRAY PV CATH (CUSTOM PROCEDURE TRAY) ×2 IMPLANT
WIRE STARTER BENTSON 035X150 (WIRE) ×1 IMPLANT

## 2021-11-29 NOTE — Progress Notes (Addendum)
[name] ?Progress Note ? ?Subjective: seen in room, no new c/o. Going for angiogram today.  ? ?Vitals:  ? 11/29/21 1114 11/29/21 1119 11/29/21 1121 11/29/21 1135  ?BP: (!) 150/85 (!) 150/85  123/73  ?Pulse: 73 (!) 0 (!) 0 79  ?Resp: 15   15  ?Temp:    97.7 ?F (36.5 ?C)  ?TempSrc:    Oral  ?SpO2: 100%   98%  ?Weight:      ?Height:      ? ? ?Exam: ?Gen alert, no distress, elderly AAM no distress ?No jvd or bruits ?Chest bilat rhonchi mild, no ^wob ?RRR no RG ?Abd soft ntnd no mass or ascites +bs ?Ext no LE or UE edema ?Neuro is alert, Ox 1.5, nonfocal ?  ?  ?  ?  ? Home meds include - asa, lipitor, symbicort, phoslo, coreg 6.25 bid, gabapentin, isoniazid, namenda, nicotine patch, protonix, spiriva, desyrel, prns ?  ?  ?  ? OP HD: [place] MWF ? 4h  400/800  66.5kg  TDC RIJ (new RUE AVG placed 3/08)  Hep 3000+ 3089midrun prn ? - Hep B Ag neg w/ Ab titer < 10 (3/09) ? - mircera 150 q2, last 3/6 , due 3/20 ? - rocaltrol 2.25 ug tiw po ?  ?  ?Assessment/ Plan: ?Hypoglycemia - resolved, off of D10, eating solid food ?Bradycardia - sp dopamine gtt. Heart rate better.  ?ESRD - on HD MWF. Had HD here yesterday off schedule. Plan next HD off schedule tomorrow.   ?Dementia - mild, cont meds ?PAF - on coreg at home, on hold here.  ?Latent TB - taking isoniazid ?Hx CVA ?Anemia ckd - Hb 7- 9 range ?MBD ckd - Ca in range, cont phoslo ac ? ? ?Rob Doctor, hospital ?11/29/2021, 12:02 PM ? ? ?Recent Labs  ?Lab 11/26/21 ?1842 11/28/21 ?1302 11/29/21 ?6283  ?HGB 8.7* 7.6* 7.5*  ?ALBUMIN 3.4* 2.8*  --   ?CALCIUM 8.2* 8.3* 8.4*  ?PHOS  --  9.5*  --   ?CREATININE 7.26* 10.13* 6.69*  ?K 4.3 6.0* 4.2  ? ?Inpatient medications: ? Chlorhexidine Gluconate Cloth  6 each Topical Q0600  ? gabapentin  100 mg Oral QHS  ? heparin  5,000 Units Subcutaneous Q8H  ? isoniazid  300 mg Oral Daily  ? mometasone-formoterol  2 puff Inhalation BID  ? predniSONE  40 mg Oral Q breakfast  ? pyridOXINE  50 mg Oral Daily  ? sodium chloride flush  3 mL  Intravenous Q12H  ? umeclidinium bromide  1 puff Inhalation Daily  ? ? sodium chloride    ? ?sodium chloride, acetaminophen, dextrose, docusate sodium, heparin, hydrALAZINE, ipratropium-albuterol, labetalol, ondansetron (ZOFRAN) IV, polyethylene glycol, sodium chloride flush ? ? ? ? ? ? ?

## 2021-11-29 NOTE — Progress Notes (Signed)
? ?  VASCULAR SURGERY ASSESSMENT & PLAN:  ? ?POD 2 S/P RIGHT UPPER ARM AV GRAFT: He has a good thrill in his graft and a palpable radial pulse.  ? ?SEVERE PAIN IN BOTH FEET: Plan for angiography today. All questions answered. ? ?END-STAGE RENAL DISEASE ? ?SUBJECTIVE:  ? ?Complains of severe pain in both feet.   ? ?PHYSICAL EXAM:  ? ?Vitals:  ? 11/29/21 0729 11/29/21 0731 11/29/21 0800 11/29/21 0900  ?BP:   131/72 130/75  ?Pulse:   89 88  ?Resp:   16 16  ?Temp:   98.5 ?F (36.9 ?C)   ?TempSrc:   Oral   ?SpO2: 96% 95% 93% 94%  ?Weight:      ?Height:      ? ?His graft has a palpable thrill. ?He has a palpable right radial pulse. ?He has moderate swelling in the right arm. ?He has palpable femoral pulses. ? ?LABS:  ? ?Lab Results  ?Component Value Date  ? WBC 13.5 (H) 11/29/2021  ? HGB 7.5 (L) 11/29/2021  ? HCT 24.2 (L) 11/29/2021  ? MCV 86.1 11/29/2021  ? PLT 287 11/29/2021  ? ?Lab Results  ?Component Value Date  ? CREATININE 6.69 (H) 11/29/2021  ? ?Lab Results  ?Component Value Date  ? INR 1.2 10/12/2021  ? ?CBG (last 3)  ?Recent Labs  ?  11/28/21 ?2308 11/29/21 ?0312 11/29/21 ?0744  ?GLUCAP 119* 101* 103*  ? ? ?PROBLEM LIST:   ? ?Principal Problem: ?  Hypotension ?Active Problems: ?  ESRD (end stage renal disease) on dialysis [facility]) ?  Dementia without behavioral disturbance ([place]) ?  TB lung, latent ?  Hypoglycemia ?  Encephalopathy acute ?  Pressure injury of skin ?  Foot pain, bilateral ? ? ?CURRENT MEDS:  ? ? [MAR Hold] Chlorhexidine Gluconate Cloth  6 each Topical Q0600  ? [MAR Hold] gabapentin  100 mg Oral QHS  ? [MAR Hold] heparin  5,000 Units Subcutaneous Q8H  ? [MAR Hold] isoniazid  300 mg Oral Daily  ? [MAR Hold] mometasone-formoterol  2 puff Inhalation BID  ? [MAR Hold] predniSONE  40 mg Oral Q breakfast  ? [MAR Hold] pyridOXINE  50 mg Oral Daily  ? [MAR Hold] umeclidinium bromide  1 puff Inhalation Daily  ? ? ?Yevonne Aline. Stanford Breed, MD ?[facility] [place] ?Office Phone Number: [phone] ?11/29/2021 10:21 AM ? ? ?

## 2021-11-29 NOTE — Progress Notes (Signed)
PROGRESS NOTE    Anthony [name]  MGQ:[id number] DOB: 1941/06/02 DOA: 11/26/2021 PCP: Isaac Bliss, Rayford Halsted, MD    Brief Narrative:  81 year old male with end-stage renal disease on hemodialysis who presented today for right upper extremity AV graft, he tolerated procedure well, postprocedure patient was noted to be hypotensive, bradycardic and hypoglycemic.  Patient received multiple doses of D50 ampules and was started on dopamine. Admitted to critical care initially and transferred to [facility] on 3/9.  Underwent aortogram/angiogram on 3/10.  From SNF    Assessment and Plan: * Hypotension Acute Hypotension and Encephalopathy, likely related to sedation  - resolved  Foot pain, bilateral -vascular consult: s/p angiogram: Best medical therapy for PAD. No options for revascularization. Only option for pain control is AKA or BKA -resumed neurontin- increased dose  Pressure injury of skin Pressure Injury 11/26/21 Sacrum Mid;Upper Stage 1 -  Intact skin with non-blanchable redness of a localized area usually over a bony prominence. (Active)  11/26/21 1548  Location: Sacrum  Location Orientation: Mid;Upper  Staging: Stage 1 -  Intact skin with non-blanchable redness of a localized area usually over a bony prominence.  Wound Description (Comments):   Present on Admission: Yes       Encephalopathy acute -see hypotension -resolved after BP And blood sugar fixed  Hypoglycemia -appears resolved -PRN D50  TB lung, latent -continue Isoniazid  Dementia without behavioral disturbance (HCC) -delirium precautions   ESRD (end stage renal disease) on dialysis [facility]) -consulted nephrology as he is due for HD- got 3/9 -vascular consult: POD 2 S/P RIGHT UPPER ARM AV GRAFT: He has a good thrill in his graft and a palpable radial pulse.  He does have significant swelling but there is nothing to do for this for now except for elevating the arm.  The graft can be used in 1 month and at that point  the catheter can be removed which will likely significantly help with the arm swelling.  If he has persistent swelling, then once the graft can be cannulated in 1 month we could obtain a fistulogram to look for central venous stenosis.          DVT prophylaxis: heparin injection 5,000 Units Start: 11/29/21 2200 SCD's Start: 11/29/21 1136 SCDs Start: 11/26/21 1414    Code Status: Full Code Family Communication:   Disposition Plan:  Level of care: Progressive Status is: Inpatient Remains inpatient appropriate because: need tx back to SNF    Consultants:  Renal vascular     Subjective: No CP, SOB-- does have severe foot pain  Objective: Vitals:   11/29/21 1114 11/29/21 1119 11/29/21 1121 11/29/21 1135  BP: (!) 150/85 (!) 150/85  123/73  Pulse: 73 (!) 0 (!) 0 79  Resp: 15   15  Temp:    97.7 F (36.5 C)  TempSrc:    Oral  SpO2: 100%   98%  Weight:      Height:        Intake/Output Summary (Last 24 hours) at 11/29/2021 1320 Last data filed at 11/29/2021 0600 Gross per 24 hour  Intake 540 ml  Output 2000 ml  Net -1460 ml   Filed Weights   11/28/21 1240 11/28/21 1553 11/29/21 0500  Weight: 70.7 kg 68.7 kg 70 kg    Examination:   General: Appearance:    Elderly  male who appears to be in pain     Lungs:      respirations unlabored  Heart:    Normal heart rate.  MS:   All extremities are intact.    Neurologic:   Awake, alert, able to make needs known       Data Reviewed: I have personally reviewed following labs and imaging studies  CBC: Recent Labs  Lab 11/26/21 0820 11/26/21 1842 11/28/21 1302 11/29/21 0709  WBC  --  11.7* 12.5* 13.5*  HGB 7.1* 8.7* 7.6* 7.5*  HCT 21.0* 28.1* 24.1* 24.2*  MCV  --  89.2 84.9 86.1  PLT  --  266 301 629   Basic Metabolic Panel: Recent Labs  Lab 11/26/21 0820 11/26/21 1842 11/28/21 1302 11/29/21 0709  NA 142 133* 126* 136  K 2.8* 4.3 6.0* 4.2  CL 106 95* 90* 96*  CO2  --  21* 19* 25  GLUCOSE 59*  96 116* 88  BUN 34* 52* 81* 46*  CREATININE 5.50* 7.26* 10.13* 6.69*  CALCIUM  --  8.2* 8.3* 8.4*  MG  --  2.0  --   --   PHOS  --   --  9.5*  --    GFR: Estimated Creatinine Clearance: 8.5 mL/min (A) (by C-G formula based on SCr of 6.69 mg/dL (H)). Liver Function Tests: Recent Labs  Lab 11/26/21 1842 11/28/21 1302  AST 12*  --   ALT 5  --   ALKPHOS 70  --   BILITOT 0.5  --   PROT 7.0  --   ALBUMIN 3.4* 2.8*   No results for input(s): LIPASE, AMYLASE in the last 168 hours. No results for input(s): AMMONIA in the last 168 hours. Coagulation Profile: No results for input(s): INR, PROTIME in the last 168 hours. Cardiac Enzymes: No results for input(s): CKTOTAL, CKMB, CKMBINDEX, TROPONINI in the last 168 hours. BNP (last 3 results) No results for input(s): PROBNP in the last 8760 hours. HbA1C: No results for input(s): HGBA1C in the last 72 hours. CBG: Recent Labs  Lab 11/28/21 2203 11/28/21 2308 11/29/21 0312 11/29/21 0744 11/29/21 1144  GLUCAP 100* 119* 101* 103* 102*   Lipid Profile: No results for input(s): CHOL, HDL, LDLCALC, TRIG, CHOLHDL, LDLDIRECT in the last 72 hours. Thyroid Function Tests: No results for input(s): TSH, T4TOTAL, FREET4, T3FREE, THYROIDAB in the last 72 hours. Anemia Panel: No results for input(s): VITAMINB12, FOLATE, FERRITIN, TIBC, IRON, RETICCTPCT in the last 72 hours. Sepsis Labs: Recent Labs  Lab 11/26/21 1842  LATICACIDVEN 1.7    Recent Results (from the past 240 hour(s))  MRSA Next Gen by PCR, Nasal     Status: None   Collection Time: 11/26/21  3:43 PM   Specimen: Nasal Mucosa; Nasal Swab  Result Value Ref Range Status   MRSA by PCR Next Gen NOT DETECTED NOT DETECTED Final    Comment: (NOTE) The GeneXpert MRSA Assay (FDA approved for NASAL specimens only), is one component of a comprehensive MRSA colonization surveillance program. It is not intended to diagnose MRSA infection nor to guide or monitor treatment for MRSA  infections. Test performance is not FDA approved in patients less than 10 years old. Performed at [facility], [place] [address], [place], [place] [address]   Resp Panel by RT-PCR (Flu A&B, Covid) Nasopharyngeal Swab     Status: None   Collection Time: 11/28/21  9:12 AM   Specimen: Nasopharyngeal Swab; Nasopharyngeal(NP) swabs in vial transport medium  Result Value Ref Range Status   SARS Coronavirus 2 by RT PCR NEGATIVE NEGATIVE Final    Comment: (NOTE) SARS-CoV-2 target nucleic acids are NOT DETECTED.  The SARS-CoV-2  RNA is generally detectable in upper respiratory specimens during the acute phase of infection. The lowest concentration of SARS-CoV-2 viral copies this assay can detect is 138 copies/mL. A negative result does not preclude SARS-Cov-2 infection and should not be used as the sole basis for treatment or other patient management decisions. A negative result may occur with  improper specimen collection/handling, submission of specimen other than nasopharyngeal swab, presence of viral mutation(s) within the areas targeted by this assay, and inadequate number of viral copies(<138 copies/mL). A negative result must be combined with clinical observations, patient history, and epidemiological information. The expected result is Negative.  Fact Sheet for Patients:  EntrepreneurPulse.com.au  Fact Sheet for Healthcare Providers:  IncredibleEmployment.be  This test is no t yet approved or cleared by the [place] FDA and  has been authorized for detection and/or diagnosis of SARS-CoV-2 by FDA under an Emergency Use Authorization (EUA). This EUA will remain  in effect (meaning this test can be used) for the duration of the COVID-19 declaration under Section 564(b)(1) of the Act, 21 U.S.C.section 360bbb-3(b)(1), unless the authorization is terminated  or revoked sooner.       Influenza A by PCR NEGATIVE NEGATIVE Final   Influenza B  by PCR NEGATIVE NEGATIVE Final    Comment: (NOTE) The Xpert Xpress SARS-CoV-2/FLU/RSV plus assay is intended as an aid in the diagnosis of influenza from Nasopharyngeal swab specimens and should not be used as a sole basis for treatment. Nasal washings and aspirates are unacceptable for Xpert Xpress SARS-CoV-2/FLU/RSV testing.  Fact Sheet for Patients: EntrepreneurPulse.com.au  Fact Sheet for Healthcare Providers: IncredibleEmployment.be  This test is not yet approved or cleared by the [place] FDA and has been authorized for detection and/or diagnosis of SARS-CoV-2 by FDA under an Emergency Use Authorization (EUA). This EUA will remain in effect (meaning this test can be used) for the duration of the COVID-19 declaration under Section 564(b)(1) of the Act, 21 U.S.C. section 360bbb-3(b)(1), unless the authorization is terminated or revoked.  Performed at [facility], [place] [address], [place], [place] [address]          Radiology Studies: PERIPHERAL VASCULAR CATHETERIZATION  Result Date: 11/29/2021 DATE OF SERVICE: 11/29/2021  PATIENT:  Anthony [name]  81 y.o. male  PRE-OPERATIVE DIAGNOSIS:  Atherosclerosis of native arteries of bilateral lower extremities causing ischemic rest pain  POST-OPERATIVE DIAGNOSIS:  Same  PROCEDURE:  1) US guided right common femoral artery access 2) Aortogram 3) Left lower extremity angiogram with second order cannulation 4) Right lower extremity angiogram (145mL total contrast) 5) Conscious sedation (38 minutes)  SURGEON:  Yevonne Aline. Stanford Breed, MD  ASSISTANT: none  ANESTHESIA:   local and IV sedation  ESTIMATED BLOOD LOSS: minimal  LOCAL MEDICATIONS USED:  LIDOCAINE  COUNTS: confirmed correct.  PATIENT DISPOSITION:  PACU - hemodynamically stable.  Delay start of Pharmacological VTE agent (>24hrs) due to surgical blood loss or risk of bleeding: no  INDICATION FOR PROCEDURE: Jasaiah Karwowski is a 81 y.o. male with  ischemic rest pain of bilateral lower extremities with physical exam consistent with severe peripheral arterial disease. After careful discussion of risks, benefits, and alternatives the patient was offered angiography. The patient understood and wished to proceed.  OPERATIVE FINDINGS: Terminal aorta and iliac arteries: Tortuous, heavily diseased, but widely patent  Right lower extremity: Common femoral artery: heavily diseased, but widely patent Profunda femoris artery: heavily diseased, but widely patent Superficial femoral artery: heavily diseased, but widely patent until Hunter's canal where the  vessel occludes. It reconstitutes less than 2 cm distal to the occlusion in the above knee popliteal artery Popliteal artery: heavily diseased, but widely patent Anterior tibial artery: occluded Tibioperoneal trunk: heavily diseased, but widely patent Peroneal artery: occluded Posterior tibial artery: occluded Pedal circulation: no flow visualized.  Left lower extremity: Common femoral artery: heavily diseased, but widely patent Profunda femoris artery: heavily diseased, but widely patent Superficial femoral artery: heavily diseased, but widely patent Popliteal artery: heavily diseased, but widely patent Anterior tibial artery: occluded Tibioperoneal trunk: heavily diseased, but widely patent Peroneal artery: occluded Posterior tibial artery: occluded Pedal circulation: no flow visualized.  DESCRIPTION OF PROCEDURE: After identification of the patient in the pre-operative holding area, the patient was transferred to the operating room. The patient was positioned supine on the operating room table. Anesthesia was induced. The groins was prepped and draped in standard fashion. A surgical pause was performed confirming correct patient, procedure, and operative location.  The right groin was anesthetized with subcutaneous injection of 1% lidocaine. Using ultrasound guidance, the right common femoral artery was accessed with  micropuncture technique. Fluoroscopy was used to confirm cannulation over the femoral head. The 9F sheath was upsized to 41F.  A Benson wire was advanced into the distal aorta. Over the wire an omni flush catheter was advanced to the level of L2. Aortogram was performed - see above for details.  The left common iliac artery was selected with a glidewire advantage guidewire. The wire was advanced into the common femoral artery. Over the wire the omni flush catheter was advanced into the external iliac artery. Selective angiography was performed - see above for details.  Retrograde angiography was performed via the right common femoral sheath to image the right leg.  A mynx device was used to close the arteriotomy. Hemostasis was excellent upon completion.  Conscious sedation was administered with the use of IV fentanyl and midazolam under continuous physician and nurse monitoring.  Heart rate, blood pressure, and oxygen saturation were continuously monitored.  Total sedation time was 38 minutes  Upon completion of the case instrument and sharps counts were confirmed correct. The patient was transferred to the PACU in good condition. I was present for all portions of the procedure.  PLAN: Best medical therapy for PAD. No options for revascularization. Only option for pain control is AKA or BKA.  Yevonne Aline. Stanford Breed, MD [facility] Phone Number: [phone] 11/29/2021 11:14 AM        Scheduled Meds:  Chlorhexidine Gluconate Cloth  6 each Topical Q0600   gabapentin  200 mg Oral QHS   heparin  5,000 Units Subcutaneous Q8H   isoniazid  300 mg Oral Daily   mometasone-formoterol  2 puff Inhalation BID   predniSONE  40 mg Oral Q breakfast   pyridOXINE  50 mg Oral Daily   sodium chloride flush  3 mL Intravenous Q12H   umeclidinium bromide  1 puff Inhalation Daily   Continuous Infusions:  sodium chloride       LOS: 3 days    Time spent: 75 minutes spent on chart  review, discussion with nursing staff, consultants, updating family and interview/physical exam; more than 50% of that time was spent in counseling and/or coordination of care.    Geradine Girt, DO [facility] Available via Epic secure chat 7am-7pm After these hours, please refer to coverage provider listed on [email or web address] 11/29/2021, 1:20 PM

## 2021-11-29 NOTE — Op Note (Signed)
DATE OF SERVICE: 11/29/2021 ? ?PATIENT:  Anthony [name]  81 y.o. male ? ?PRE-OPERATIVE DIAGNOSIS:  Atherosclerosis of native arteries of bilateral lower extremities causing ischemic rest pain ? ?POST-OPERATIVE DIAGNOSIS:  Same ? ?PROCEDURE:   ?1) US guided right common femoral artery access ?2) Aortogram ?3) Left lower extremity angiogram with second order cannulation  ?4) Right lower extremity angiogram (116mL total contrast) ?5) Conscious sedation (38 minutes) ? ?SURGEON:  Yevonne Aline. Stanford Breed, MD ? ?ASSISTANT: none ? ?ANESTHESIA:   local and IV sedation ? ?ESTIMATED BLOOD LOSS: minimal ? ?LOCAL MEDICATIONS USED:  LIDOCAINE  ? ?COUNTS: confirmed correct. ? ?PATIENT DISPOSITION:  PACU - hemodynamically stable. ?  ?Delay start of Pharmacological VTE agent (>24hrs) due to surgical blood loss or risk of bleeding: no ? ?INDICATION FOR PROCEDURE: Anthony [name] is a 81 y.o. male with ischemic rest pain of bilateral lower extremities with physical exam consistent with severe peripheral arterial disease. After careful discussion of risks, benefits, and alternatives the patient was offered angiography. The patient understood and wished to proceed. ? ?OPERATIVE FINDINGS:  ?Terminal aorta and iliac arteries: ?Tortuous, heavily diseased, but widely patent ? ?Right lower extremity: ?Common femoral artery: heavily diseased, but widely patent  ?Profunda femoris artery: heavily diseased, but widely patent  ?Superficial femoral artery: heavily diseased, but widely patent until Hunter's canal where the vessel occludes. It reconstitutes less than 2 cm distal to the occlusion in the above knee popliteal artery ?Popliteal artery: heavily diseased, but widely patent ?Anterior tibial artery: occluded ?Tibioperoneal trunk: heavily diseased, but widely patent ?Peroneal artery: occluded ?Posterior tibial artery: occluded ?Pedal circulation: no flow visualized. ? ?Left lower extremity: ?Common femoral artery: heavily diseased, but widely patent   ?Profunda femoris artery: heavily diseased, but widely patent  ?Superficial femoral artery: heavily diseased, but widely patent ?Popliteal artery: heavily diseased, but widely patent ?Anterior tibial artery: occluded ?Tibioperoneal trunk: heavily diseased, but widely patent ?Peroneal artery: occluded ?Posterior tibial artery: occluded ?Pedal circulation: no flow visualized. ? ?DESCRIPTION OF PROCEDURE: After identification of the patient in the pre-operative holding area, the patient was transferred to the operating room. The patient was positioned supine on the operating room table. Anesthesia was induced. The groins was prepped and draped in standard fashion. A surgical pause was performed confirming correct patient, procedure, and operative location. ? ?The right groin was anesthetized with subcutaneous injection of 1% lidocaine. Using ultrasound guidance, the right common femoral artery was accessed with micropuncture technique. Fluoroscopy was used to confirm cannulation over the femoral head. The 54F sheath was upsized to 66F.  ? ?A Benson wire was advanced into the distal aorta. Over the wire an omni flush catheter was advanced to the level of L2. Aortogram was performed - see above for details.  ? ?The left common iliac artery was selected with a glidewire advantage guidewire. The wire was advanced into the common femoral artery. Over the wire the omni flush catheter was advanced into the external iliac artery. Selective angiography was performed - see above for details.  ? ?Retrograde angiography was performed via the right common femoral sheath to image the right leg. ? ?A mynx device was used to close the arteriotomy. Hemostasis was excellent upon completion. ? ?Conscious sedation was administered with the use of IV fentanyl and midazolam under continuous physician and nurse monitoring.  Heart rate, blood pressure, and oxygen saturation were continuously monitored.  Total sedation time was 38  minutes ? ?Upon completion of the case instrument and sharps counts were confirmed correct. The  patient was transferred to the PACU in good condition. I was present for all portions of the procedure. ? ?PLAN: Best medical therapy for PAD. No options for revascularization. Only option for pain control is AKA or BKA.  ? ?Yevonne Aline. Stanford Breed, MD ?[facility] [place] ?Office Phone Number: [phone] ?11/29/2021 11:14 AM ? ?

## 2021-11-30 LAB — GLUCOSE, CAPILLARY
Glucose-Capillary: 111 mg/dL — ABNORMAL HIGH (ref 70–99)
Glucose-Capillary: 135 mg/dL — ABNORMAL HIGH (ref 70–99)
Glucose-Capillary: 139 mg/dL — ABNORMAL HIGH (ref 70–99)
Glucose-Capillary: 151 mg/dL — ABNORMAL HIGH (ref 70–99)
Glucose-Capillary: 180 mg/dL — ABNORMAL HIGH (ref 70–99)
Glucose-Capillary: 194 mg/dL — ABNORMAL HIGH (ref 70–99)

## 2021-11-30 LAB — LIPID PANEL
Cholesterol: 126 mg/dL (ref 0–200)
HDL: 75 mg/dL (ref >40)
LDL Cholesterol: 45 mg/dL (ref 0–99)
Total CHOL/HDL Ratio: 1.7 RATIO
Triglycerides: 28 mg/dL (ref <150)
VLDL: 6 mg/dL (ref 0–40)

## 2021-11-30 MED ORDER — HEPARIN SODIUM (PORCINE) 1000 UNIT/ML DIALYSIS
2000.0000 [IU] | INTRAMUSCULAR | Status: DC | PRN
Start: 2021-12-01 — End: 2021-11-30

## 2021-11-30 MED ORDER — OXYCODONE-ACETAMINOPHEN 5-325 MG PO TABS
1.0000 | ORAL_TABLET | Freq: Four times a day (QID) | ORAL | Status: DC | PRN
  Administered 2021-11-30 – 2021-12-01 (×2): 1 via ORAL
  Filled 2021-11-30 (×2): qty 1

## 2021-11-30 NOTE — Progress Notes (Signed)
?  ?  Patient seen on dialysis.  There is no hematoma in his groin the right upper extremity graft has a strong thrill.  He would need amputation for pain control.  He is otherwise okay for discharge from a vascular standpoint ? ?Anthony [name] C. Donzetta Matters, MD ?[facility] ?Office: [phone] ?Pager: [phone] ? ?

## 2021-11-30 NOTE — Progress Notes (Signed)
PHARMACIST LIPID MONITORING ? ? ?Anthony [name] is a 81 y.o. male admitted on 11/26/2021 for right upper extremity AV graft/  Pharmacy has been consulted to optimize lipid-lowering therapy with the indication of secondary prevention for clinical ASCVD. ? ?Recent Labs: ? ?Lipid Panel (last 6 months):   ?Lab Results  ?Component Value Date  ? CHOL 126 11/30/2021  ? TRIG 28 11/30/2021  ? HDL 75 11/30/2021  ? CHOLHDL 1.7 11/30/2021  ? VLDL 6 11/30/2021  ? LDLCALC 45 11/30/2021  ? ? ?Hepatic function panel (last 6 months):   ?Lab Results  ?Component Value Date  ? AST 12 (L) 11/26/2021  ? ALT 5 11/26/2021  ? ALKPHOS 70 11/26/2021  ? BILITOT 0.5 11/26/2021  ? ? ?SCr (since admission):   ?Serum creatinine: 6.69 mg/dL (H) 11/29/21 3143 ?Estimated creatinine clearance: 8.5 mL/min (A) ? ?Current therapy and lipid therapy tolerance ?Current lipid-lowering therapy: atorvastatin 20 mg daily ?Previous lipid-lowering therapies (if applicable): None ?Documented or reported allergies or intolerances to lipid-lowering therapies (if applicable): none ? ?Assessment:   ?Patient LDL at goal < 55 mg/dL. No complaints with current therapy.  ? ?Plan:   ? ?1.Statin intensity: Continue current therapy with atorvastatin 20 mg daily. ? ?5.Follow-up labs after discharge:  No changes in lipid therapy, repeat a lipid panel in one year.    ? ?Thank you for involving pharmacy in this patient's care. ? ?Elita Quick, PharmD ?PGY1 Ambulatory Care Pharmacy Resident ?11/30/2021 7:58 AM ? ?**Pharmacist phone directory can be found on [email or web address] listed under [place]** ? ?

## 2021-11-30 NOTE — Progress Notes (Signed)
?[facility] ?Progress Note  ? ?Subjective: Seen on HD. Oriented to self only asking for food. SBP on low side.  ? ?Objective ?Vitals:  ? 11/30/21 0025 11/30/21 0559 11/30/21 0754 11/30/21 0805  ?BP: 132/71 117/66 (!) 114/54 (!) 122/52  ?Pulse: 66 84 83 83  ?Resp: 13 11 11    ?Temp: 97.7 ?F (36.5 ?C) 97.8 ?F (36.6 ?C) 98.5 ?F (36.9 ?C)   ?TempSrc: Oral Oral Axillary   ?SpO2: 99% 98% 97%   ?Weight:  71.8 kg 71.7 kg   ?Height:      ? ?Physical Exam ?General: Chronically ill pleasantly confused ?Neuro: Oriented X 1 ?Heart: irreg,irreg AFib on monitor. No M/R/G ?Lungs: Coarse rhonchi throughout upper lung fields. No WOB. O2 sats 97% on RA ?Abdomen: NABS, ND, NT ?Extremities: No LE edema ?Dialysis Access: AVG +T/B RIJ TDC sutures intact ? ? ?Additional Objective ?Labs: ?Basic Metabolic Panel: ?Recent Labs  ?Lab 11/26/21 ?1842 11/28/21 ?1302 11/29/21 ?2956  ?NA 133* 126* 136  ?K 4.3 6.0* 4.2  ?CL 95* 90* 96*  ?CO2 21* 19* 25  ?GLUCOSE 96 116* 88  ?BUN 52* 81* 46*  ?CREATININE 7.26* 10.13* 6.69*  ?CALCIUM 8.2* 8.3* 8.4*  ?PHOS  --  9.5*  --   ? ?Liver Function Tests: ?Recent Labs  ?Lab 11/26/21 ?1842 11/28/21 ?1302  ?AST 12*  --   ?ALT 5  --   ?ALKPHOS 70  --   ?BILITOT 0.5  --   ?PROT 7.0  --   ?ALBUMIN 3.4* 2.8*  ? ?No results for input(s): LIPASE, AMYLASE in the last 168 hours. ?CBC: ?Recent Labs  ?Lab 11/26/21 ?1842 11/28/21 ?1302 11/29/21 ?2130  ?WBC 11.7* 12.5* 13.5*  ?HGB 8.7* 7.6* 7.5*  ?HCT 28.1* 24.1* 24.2*  ?MCV 89.2 84.9 86.1  ?PLT 266 301 287  ? ?Blood Culture ?No results found for: SDES, Grimes, Bartlett, REPTSTATUS ? ?Cardiac Enzymes: ?No results for input(s): CKTOTAL, CKMB, CKMBINDEX, TROPONINI in the last 168 hours. ?CBG: ?Recent Labs  ?Lab 11/29/21 ?1144 11/29/21 ?2034 11/30/21 ?0021 11/30/21 ?8657 11/30/21 ?0737  ?GLUCAP 102* 151* 151* 111* 180*  ? ?Iron Studies: No results for input(s): IRON, TIBC, TRANSFERRIN, FERRITIN in the last 72 hours. ?@lablastinr3 @ ?Studies/Results: ?PERIPHERAL  VASCULAR CATHETERIZATION ? ?Result Date: 11/29/2021 ?DATE OF SERVICE: 11/29/2021  PATIENT:  Anthony [name]  81 y.o. male  PRE-OPERATIVE DIAGNOSIS:  Atherosclerosis of native arteries of bilateral lower extremities causing ischemic rest pain  POST-OPERATIVE DIAGNOSIS:  Same  PROCEDURE:  1) US guided right common femoral artery access 2) Aortogram 3) Left lower extremity angiogram with second order cannulation 4) Right lower extremity angiogram (142mL total contrast) 5) Conscious sedation (38 minutes)  SURGEON:  Yevonne Aline. Stanford Breed, MD  ASSISTANT: none  ANESTHESIA:   local and IV sedation  ESTIMATED BLOOD LOSS: minimal  LOCAL MEDICATIONS USED:  LIDOCAINE  COUNTS: confirmed correct.  PATIENT DISPOSITION:  PACU - hemodynamically stable.  Delay start of Pharmacological VTE agent (>24hrs) due to surgical blood loss or risk of bleeding: no  INDICATION FOR PROCEDURE: Anthony [name] is a 81 y.o. male with ischemic rest pain of bilateral lower extremities with physical exam consistent with severe peripheral arterial disease. After careful discussion of risks, benefits, and alternatives the patient was offered angiography. The patient understood and wished to proceed.  OPERATIVE FINDINGS: Terminal aorta and iliac arteries: Tortuous, heavily diseased, but widely patent  Right lower extremity: Common femoral artery: heavily diseased, but widely patent Profunda femoris artery: heavily diseased, but widely patent Superficial femoral artery:  heavily diseased, but widely patent until Hunter's canal where the vessel occludes. It reconstitutes less than 2 cm distal to the occlusion in the above knee popliteal artery Popliteal artery: heavily diseased, but widely patent Anterior tibial artery: occluded Tibioperoneal trunk: heavily diseased, but widely patent Peroneal artery: occluded Posterior tibial artery: occluded Pedal circulation: no flow visualized.  Left lower extremity: Common femoral artery: heavily diseased, but widely patent  Profunda femoris artery: heavily diseased, but widely patent Superficial femoral artery: heavily diseased, but widely patent Popliteal artery: heavily diseased, but widely patent Anterior tibial artery: occluded Tibioperoneal trunk: heavily diseased, but widely patent Peroneal artery: occluded Posterior tibial artery: occluded Pedal circulation: no flow visualized.  DESCRIPTION OF PROCEDURE: After identification of the patient in the pre-operative holding area, the patient was transferred to the operating room. The patient was positioned supine on the operating room table. Anesthesia was induced. The groins was prepped and draped in standard fashion. A surgical pause was performed confirming correct patient, procedure, and operative location.  The right groin was anesthetized with subcutaneous injection of 1% lidocaine. Using ultrasound guidance, the right common femoral artery was accessed with micropuncture technique. Fluoroscopy was used to confirm cannulation over the femoral head. The 36F sheath was upsized to 52F.  A Benson wire was advanced into the distal aorta. Over the wire an omni flush catheter was advanced to the level of L2. Aortogram was performed - see above for details.  The left common iliac artery was selected with a glidewire advantage guidewire. The wire was advanced into the common femoral artery. Over the wire the omni flush catheter was advanced into the external iliac artery. Selective angiography was performed - see above for details.  Retrograde angiography was performed via the right common femoral sheath to image the right leg.  A mynx device was used to close the arteriotomy. Hemostasis was excellent upon completion.  Conscious sedation was administered with the use of IV fentanyl and midazolam under continuous physician and nurse monitoring.  Heart rate, blood pressure, and oxygen saturation were continuously monitored.  Total sedation time was 38 minutes  Upon completion of the case  instrument and sharps counts were confirmed correct. The patient was transferred to the PACU in good condition. I was present for all portions of the procedure.  PLAN: Best medical therapy for PAD. No options for revascularization. Only option for pain control is AKA or BKA.  Yevonne Aline. Stanford Breed, MD [facility] Phone Number: [phone] 11/29/2021 11:14 AM   ?Medications: ? sodium chloride    ? ? atorvastatin  20 mg Oral Daily  ? Chlorhexidine Gluconate Cloth  6 each Topical Q0600  ? gabapentin  200 mg Oral QHS  ? heparin  5,000 Units Subcutaneous Q8H  ? isoniazid  300 mg Oral Daily  ? mometasone-formoterol  2 puff Inhalation BID  ? predniSONE  40 mg Oral Q breakfast  ? pyridOXINE  50 mg Oral Daily  ? sodium chloride flush  3 mL Intravenous Q12H  ? umeclidinium bromide  1 puff Inhalation Daily  ? ? ? ?Home meds include - asa, lipitor, symbicort, phoslo, coreg 6.25 bid, gabapentin, isoniazid, namenda, nicotine patch, protonix, spiriva, desyrel, prns ?  ?  ?  ? OP HD: [place] MWF ? 4h  400/800  66.5kg  TDC RIJ (new RUE AVG placed 3/08)  Hep 3000+ 3047midrun prn ? - Hep B Ag neg w/ Ab titer < 10 (3/09) ? - mircera 150 q2, last 3/6 ,  due 3/20 ? - rocaltrol 2.25 ug tiw po ?  ?  ?Assessment/ Plan: ?Hypoglycemia - resolved, off of D10, eating solid food ?Bradycardia - sp dopamine gtt. Heart rate better.  ?ESRD - on HD MWF. Had HD here today off schedule. Resume MWF next HD 12/02/2021 ?Dementia - mild, cont meds ?PAF - on coreg at home, on hold here.  ?Latent TB - taking isoniazid ?Hx CVA ?Anemia ckd - Hb 7- 9 range. HGB pending. May need transfusion. ?MBD ckd - Ca in range, cont phoslo ac ?  ?Anthony Winton H. Zadyn Yardley NP-C ?11/30/2021, 9:36 AM  ?[facility] ?[phone] ? ? ?  ? ?

## 2021-11-30 NOTE — Progress Notes (Signed)
PROGRESS NOTE    Arkin Imran  ZOX:[id number] DOB: 1941-06-30 DOA: 11/26/2021 PCP: Isaac Bliss, Rayford Halsted, MD    Brief Narrative:  81 year old male with end-stage renal disease on hemodialysis who presented today for right upper extremity AV graft, he tolerated procedure well, postprocedure patient was noted to be hypotensive, bradycardic and hypoglycemic.  Patient received multiple doses of D50 ampules and was started on dopamine. Admitted to critical care initially and transferred to [facility] on 3/9.  Underwent aortogram/angiogram on 3/10.  From SNF    Assessment and Plan: * Hypotension Acute Hypotension and Encephalopathy, likely related to sedation  - resolved  Foot pain, bilateral -vascular consult: s/p angiogram: Best medical therapy for PAD. No options for revascularization. Only option for pain control is AKA or BKA -resumed neurontin- increased dose  Pressure injury of skin Pressure Injury 11/26/21 Sacrum Mid;Upper Stage 1 -  Intact skin with non-blanchable redness of a localized area usually over a bony prominence. (Active)  11/26/21 1548  Location: Sacrum  Location Orientation: Mid;Upper  Staging: Stage 1 -  Intact skin with non-blanchable redness of a localized area usually over a bony prominence.  Wound Description (Comments):   Present on Admission: Yes       Encephalopathy acute -see hypotension -resolved after BP And blood sugar fixed  Hypoglycemia -appears resolved -PRN D50  TB lung, latent -continue Isoniazid  Dementia without behavioral disturbance (HCC) -delirium precautions   ESRD (end stage renal disease) on dialysis [facility]) -consulted nephrology as he is due for HD- got 3/9 -vascular consult: POD 2 S/P RIGHT UPPER ARM AV GRAFT: He has a good thrill in his graft and a palpable radial pulse.  He does have significant swelling but there is nothing to do for this for now except for elevating the arm.  The graft can be used in 1 month and at that point  the catheter can be removed which will likely significantly help with the arm swelling.  If he has persistent swelling, then once the graft can be cannulated in 1 month we could obtain a fistulogram to look for central venous stenosis.          DVT prophylaxis: heparin injection 5,000 Units Start: 11/29/21 2200 SCD's Start: 11/29/21 1136 SCDs Start: 11/26/21 1414    Code Status: Full Code Family Communication:   Disposition Plan:  Level of care: Progressive Status is: Inpatient Remains inpatient appropriate because: need tx back to SNF    Consultants:  Renal vascular     Subjective: Seen on HD  Objective: Vitals:   11/30/21 1030 11/30/21 1100 11/30/21 1124 11/30/21 1154  BP: (!) 79/53 (!) 86/59 102/68 (!) 112/59  Pulse: 78 80 80   Resp:   13 18  Temp:   97.9 F (36.6 C)   TempSrc:   Oral   SpO2:   95% 97%  Weight:      Height:        Intake/Output Summary (Last 24 hours) at 11/30/2021 1436 Last data filed at 11/30/2021 1104 Gross per 24 hour  Intake --  Output 2246 ml  Net -2246 ml   Filed Weights   11/29/21 0500 11/30/21 0559 11/30/21 0754  Weight: 70 kg 71.8 kg 71.7 kg    Examination:   General: Appearance:    Elderly male in no acute distress- on HD     Lungs:     Clear to auscultation bilaterally, respirations unlabored  Heart:    Normal heart rate.   MS:   All  extremities are intact.    Neurologic:   Awake, alert- on HD         Data Reviewed: I have personally reviewed following labs and imaging studies  CBC: Recent Labs  Lab 11/26/21 0820 11/26/21 1842 11/28/21 1302 11/29/21 0709  WBC  --  11.7* 12.5* 13.5*  HGB 7.1* 8.7* 7.6* 7.5*  HCT 21.0* 28.1* 24.1* 24.2*  MCV  --  89.2 84.9 86.1  PLT  --  266 301 998   Basic Metabolic Panel: Recent Labs  Lab 11/26/21 0820 11/26/21 1842 11/28/21 1302 11/29/21 0709  NA 142 133* 126* 136  K 2.8* 4.3 6.0* 4.2  CL 106 95* 90* 96*  CO2  --  21* 19* 25  GLUCOSE 59* 96 116* 88  BUN  34* 52* 81* 46*  CREATININE 5.50* 7.26* 10.13* 6.69*  CALCIUM  --  8.2* 8.3* 8.4*  MG  --  2.0  --   --   PHOS  --   --  9.5*  --    GFR: Estimated Creatinine Clearance: 8.5 mL/min (A) (by C-G formula based on SCr of 6.69 mg/dL (H)). Liver Function Tests: Recent Labs  Lab 11/26/21 1842 11/28/21 1302  AST 12*  --   ALT 5  --   ALKPHOS 70  --   BILITOT 0.5  --   PROT 7.0  --   ALBUMIN 3.4* 2.8*   No results for input(s): LIPASE, AMYLASE in the last 168 hours. No results for input(s): AMMONIA in the last 168 hours. Coagulation Profile: No results for input(s): INR, PROTIME in the last 168 hours. Cardiac Enzymes: No results for input(s): CKTOTAL, CKMB, CKMBINDEX, TROPONINI in the last 168 hours. BNP (last 3 results) No results for input(s): PROBNP in the last 8760 hours. HbA1C: No results for input(s): HGBA1C in the last 72 hours. CBG: Recent Labs  Lab 11/29/21 2034 11/30/21 0021 11/30/21 0538 11/30/21 0737 11/30/21 1152  GLUCAP 151* 151* 111* 180* 135*   Lipid Profile: Recent Labs    11/30/21 0155  CHOL 126  HDL 75  LDLCALC 45  TRIG 28  CHOLHDL 1.7   Thyroid Function Tests: No results for input(s): TSH, T4TOTAL, FREET4, T3FREE, THYROIDAB in the last 72 hours. Anemia Panel: No results for input(s): VITAMINB12, FOLATE, FERRITIN, TIBC, IRON, RETICCTPCT in the last 72 hours. Sepsis Labs: Recent Labs  Lab 11/26/21 1842  LATICACIDVEN 1.7    Recent Results (from the past 240 hour(s))  MRSA Next Gen by PCR, Nasal     Status: None   Collection Time: 11/26/21  3:43 PM   Specimen: Nasal Mucosa; Nasal Swab  Result Value Ref Range Status   MRSA by PCR Next Gen NOT DETECTED NOT DETECTED Final    Comment: (NOTE) The GeneXpert MRSA Assay (FDA approved for NASAL specimens only), is one component of a comprehensive MRSA colonization surveillance program. It is not intended to diagnose MRSA infection nor to guide or monitor treatment for MRSA infections. Test  performance is not FDA approved in patients less than 63 years old. Performed at [facility], [place] [address], [place], [place] [address]   Resp Panel by RT-PCR (Flu A&B, Covid) Nasopharyngeal Swab     Status: None   Collection Time: 11/28/21  9:12 AM   Specimen: Nasopharyngeal Swab; Nasopharyngeal(NP) swabs in vial transport medium  Result Value Ref Range Status   SARS Coronavirus 2 by RT PCR NEGATIVE NEGATIVE Final    Comment: (NOTE) SARS-CoV-2 target nucleic acids are NOT  DETECTED.  The SARS-CoV-2 RNA is generally detectable in upper respiratory specimens during the acute phase of infection. The lowest concentration of SARS-CoV-2 viral copies this assay can detect is 138 copies/mL. A negative result does not preclude SARS-Cov-2 infection and should not be used as the sole basis for treatment or other patient management decisions. A negative result may occur with  improper specimen collection/handling, submission of specimen other than nasopharyngeal swab, presence of viral mutation(s) within the areas targeted by this assay, and inadequate number of viral copies(<138 copies/mL). A negative result must be combined with clinical observations, patient history, and epidemiological information. The expected result is Negative.  Fact Sheet for Patients:  EntrepreneurPulse.com.au  Fact Sheet for Healthcare Providers:  IncredibleEmployment.be  This test is no t yet approved or cleared by the [place] FDA and  has been authorized for detection and/or diagnosis of SARS-CoV-2 by FDA under an Emergency Use Authorization (EUA). This EUA will remain  in effect (meaning this test can be used) for the duration of the COVID-19 declaration under Section 564(b)(1) of the Act, 21 U.S.C.section 360bbb-3(b)(1), unless the authorization is terminated  or revoked sooner.       Influenza A by PCR NEGATIVE NEGATIVE Final   Influenza B by PCR NEGATIVE  NEGATIVE Final    Comment: (NOTE) The Xpert Xpress SARS-CoV-2/FLU/RSV plus assay is intended as an aid in the diagnosis of influenza from Nasopharyngeal swab specimens and should not be used as a sole basis for treatment. Nasal washings and aspirates are unacceptable for Xpert Xpress SARS-CoV-2/FLU/RSV testing.  Fact Sheet for Patients: EntrepreneurPulse.com.au  Fact Sheet for Healthcare Providers: IncredibleEmployment.be  This test is not yet approved or cleared by the [place] FDA and has been authorized for detection and/or diagnosis of SARS-CoV-2 by FDA under an Emergency Use Authorization (EUA). This EUA will remain in effect (meaning this test can be used) for the duration of the COVID-19 declaration under Section 564(b)(1) of the Act, 21 U.S.C. section 360bbb-3(b)(1), unless the authorization is terminated or revoked.  Performed at [facility], [place] [address], [place], [place] [address]          Radiology Studies: PERIPHERAL VASCULAR CATHETERIZATION  Result Date: 11/29/2021 DATE OF SERVICE: 11/29/2021  PATIENT:  [name]  81 y.o. male  PRE-OPERATIVE DIAGNOSIS:  Atherosclerosis of native arteries of bilateral lower extremities causing ischemic rest pain  POST-OPERATIVE DIAGNOSIS:  Same  PROCEDURE:  1) US guided right common femoral artery access 2) Aortogram 3) Left lower extremity angiogram with second order cannulation 4) Right lower extremity angiogram (144mL total contrast) 5) Conscious sedation (38 minutes)  SURGEON:  Yevonne Aline. Stanford Breed, MD  ASSISTANT: none  ANESTHESIA:   local and IV sedation  ESTIMATED BLOOD LOSS: minimal  LOCAL MEDICATIONS USED:  LIDOCAINE  COUNTS: confirmed correct.  PATIENT DISPOSITION:  PACU - hemodynamically stable.  Delay start of Pharmacological VTE agent (>24hrs) due to surgical blood loss or risk of bleeding: no  INDICATION FOR PROCEDURE: Lorimer Tiberio is a 81 y.o. male with ischemic rest pain  of bilateral lower extremities with physical exam consistent with severe peripheral arterial disease. After careful discussion of risks, benefits, and alternatives the patient was offered angiography. The patient understood and wished to proceed.  OPERATIVE FINDINGS: Terminal aorta and iliac arteries: Tortuous, heavily diseased, but widely patent  Right lower extremity: Common femoral artery: heavily diseased, but widely patent Profunda femoris artery: heavily diseased, but widely patent Superficial femoral artery: heavily diseased, but widely patent until  Hunter's canal where the vessel occludes. It reconstitutes less than 2 cm distal to the occlusion in the above knee popliteal artery Popliteal artery: heavily diseased, but widely patent Anterior tibial artery: occluded Tibioperoneal trunk: heavily diseased, but widely patent Peroneal artery: occluded Posterior tibial artery: occluded Pedal circulation: no flow visualized.  Left lower extremity: Common femoral artery: heavily diseased, but widely patent Profunda femoris artery: heavily diseased, but widely patent Superficial femoral artery: heavily diseased, but widely patent Popliteal artery: heavily diseased, but widely patent Anterior tibial artery: occluded Tibioperoneal trunk: heavily diseased, but widely patent Peroneal artery: occluded Posterior tibial artery: occluded Pedal circulation: no flow visualized.  DESCRIPTION OF PROCEDURE: After identification of the patient in the pre-operative holding area, the patient was transferred to the operating room. The patient was positioned supine on the operating room table. Anesthesia was induced. The groins was prepped and draped in standard fashion. A surgical pause was performed confirming correct patient, procedure, and operative location.  The right groin was anesthetized with subcutaneous injection of 1% lidocaine. Using ultrasound guidance, the right common femoral artery was accessed with micropuncture  technique. Fluoroscopy was used to confirm cannulation over the femoral head. The 87F sheath was upsized to 51F.  A Benson wire was advanced into the distal aorta. Over the wire an omni flush catheter was advanced to the level of L2. Aortogram was performed - see above for details.  The left common iliac artery was selected with a glidewire advantage guidewire. The wire was advanced into the common femoral artery. Over the wire the omni flush catheter was advanced into the external iliac artery. Selective angiography was performed - see above for details.  Retrograde angiography was performed via the right common femoral sheath to image the right leg.  A mynx device was used to close the arteriotomy. Hemostasis was excellent upon completion.  Conscious sedation was administered with the use of IV fentanyl and midazolam under continuous physician and nurse monitoring.  Heart rate, blood pressure, and oxygen saturation were continuously monitored.  Total sedation time was 38 minutes  Upon completion of the case instrument and sharps counts were confirmed correct. The patient was transferred to the PACU in good condition. I was present for all portions of the procedure.  PLAN: Best medical therapy for PAD. No options for revascularization. Only option for pain control is AKA or BKA.  Yevonne Aline. Stanford Breed, MD [facility] Phone Number: [phone] 11/29/2021 11:14 AM        Scheduled Meds:  atorvastatin  20 mg Oral Daily   Chlorhexidine Gluconate Cloth  6 each Topical Q0600   gabapentin  200 mg Oral QHS   heparin  5,000 Units Subcutaneous Q8H   isoniazid  300 mg Oral Daily   mometasone-formoterol  2 puff Inhalation BID   predniSONE  40 mg Oral Q breakfast   pyridOXINE  50 mg Oral Daily   sodium chloride flush  3 mL Intravenous Q12H   umeclidinium bromide  1 puff Inhalation Daily   Continuous Infusions:  sodium chloride       LOS: 4 days    Time spent: 75 minutes  spent on chart review, discussion with nursing staff, consultants, updating family and interview/physical exam; more than 50% of that time was spent in counseling and/or coordination of care.    Geradine Girt, DO [facility] Available via Epic secure chat 7am-7pm After these hours, please refer to coverage provider listed on [email or web address] 11/30/2021, 2:36 PM

## 2021-11-30 NOTE — Plan of Care (Signed)
  Problem: Education: Goal: Knowledge of General Education information will improve Description: Including pain rating scale, medication(s)/side effects and non-pharmacologic comfort measures Outcome: Progressing   Problem: Clinical Measurements: Goal: Respiratory complications will improve Outcome: Progressing Goal: Cardiovascular complication will be avoided Outcome: Progressing   Problem: Nutrition: Goal: Adequate nutrition will be maintained Outcome: Progressing   

## 2021-12-01 LAB — GLUCOSE, CAPILLARY
Glucose-Capillary: 127 mg/dL — ABNORMAL HIGH (ref 70–99)
Glucose-Capillary: 131 mg/dL — ABNORMAL HIGH (ref 70–99)
Glucose-Capillary: 142 mg/dL — ABNORMAL HIGH (ref 70–99)

## 2021-12-01 MED ORDER — GABAPENTIN 100 MG PO CAPS: 200.0000 mg | ORAL_CAPSULE | Freq: Every day | ORAL | Status: AC

## 2021-12-01 NOTE — Social Work (Signed)
CSW was alerted that pt was medically ready for DC. Pt is from [facility]. CSW spoke with pt and he confirmed that he is from [place] and the plan is for him to return. ? ?CSW spoke with Juliann Pulse from [facility] and she stated that pt could return and to send the pt's DC summary. ? ?Pt asked to update wife about his DC. ? ?TOC team will continue to assist with discharge planning needs.  ?

## 2021-12-01 NOTE — TOC Transition Note (Addendum)
Transition of Care (TOC) - CM/SW Discharge Note ? ? ?Patient Details  ?Name: Anthony [name] ?MRN: [id number] ?Date of Birth: January 10, 1941 ? ?Transition of Care (TOC) CM/SW Contact:  ?Bary Castilla, LCSW ?Phone Number: [phone] ?12/01/2021, 11:05 AM ? ? ?Clinical Narrative:    ? ?Patient will DC to:?[facility] ?Anticipated DC date:?12/01/2021 ?Family notified:?WIfe ?Transport RD:EYCX ?  ?Per MD patient ready for DC to [facility]. RN, patient, patient's family, and facility notified of DC. Discharge Summary sent to facility. RN given number for report  [phone]. DC packet on chart. Ambulance transport requested for patient.  ? ?CSW signing off. ?  ?Vallery Ridge, LCSW ?[phone]  ? ?  ?  ? ? ?Patient Goals and CMS Choice ?  ?  ?  ? ?Discharge Placement ?  ?           ?  ?  ?  ?  ? ?Discharge Plan and Services ?  ?  ?           ?  ?  ?  ?  ?  ?  ?  ?  ?  ?  ? ?Social Determinants of Health (SDOH) Interventions ?  ? ? ?Readmission Risk Interventions ?Readmission Risk Prevention Plan 04/24/2021  ?Transportation Screening Complete  ?PCP or Specialist Appt within 3-5 Days Complete  ?[place] or Home Care Consult Complete  ?Social Work Consult for [place] Planning/Counseling Complete  ?Palliative Care Screening Not Applicable  ?Medication Review Press photographer) Referral to Pharmacy  ? ? ? ? ? ?

## 2021-12-01 NOTE — Discharge Summary (Signed)
Physician Discharge Summary   Patient: Anthony [name] MRN: [id number] DOB: 1941-07-22  Admit date:     11/26/2021  Discharge date: 12/01/21  Discharge Physician: Geradine Girt   PCP: Isaac Bliss, Rayford Halsted, MD   Recommendations at discharge:    Vascular follow up: would need amputation of LE for pain control (if pain not controlled by PO meds) Cbc, bmp 1 week  Discharge Diagnoses: Principal Problem:   Hypotension Active Problems:   ESRD (end stage renal disease) on dialysis ([place])   Dementia without behavioral disturbance (HCC)   TB lung, latent   Hypoglycemia   Encephalopathy acute   Pressure injury of skin   Foot pain, bilateral    Hospital Course: 82 year old male with end-stage renal disease on hemodialysis who presented today for right upper extremity AV graft, he tolerated procedure well, postprocedure patient was noted to be hypotensive, bradycardic and hypoglycemic.  Patient received multiple doses of D50 ampules and was started on dopamine. Admitted to critical care initially and transferred to [facility] on 3/9.  Underwent aortogram/angiogram on 3/10.  From SNF  Assessment and Plan: * Hypotension Acute Hypotension and Encephalopathy, likely related to sedation  - resolved  Foot pain, bilateral -vascular consult: s/p angiogram: Best medical therapy for PAD. No options for revascularization. Only option for pain control is AKA or BKA -resumed neurontin- increased dose (max dose 300mg ) -add PRN norco  Pressure injury of skin Pressure Injury 11/26/21 Sacrum Mid;Upper Stage 1 -  Intact skin with non-blanchable redness of a localized area usually over a bony prominence. (Active)  11/26/21 1548  Location: Sacrum  Location Orientation: Mid;Upper  Staging: Stage 1 -  Intact skin with non-blanchable redness of a localized area usually over a bony prominence.  Wound Description (Comments):   Present on Admission: Yes       Encephalopathy acute -see  hypotension -resolved after BP And blood sugar fixed  Hypoglycemia -appears resolved -PRN D50  TB lung, latent -continue Isoniazid  Dementia without behavioral disturbance (HCC) -delirium precautions   ESRD (end stage renal disease) on dialysis [facility]) -consulted nephrology as he is due for HD -vascular consult: POD 2 S/P RIGHT UPPER ARM AV GRAFT: He has a good thrill in his graft and a palpable radial pulse.  He does have significant swelling but there is nothing to do for this for now except for elevating the arm.  The graft can be used in 1 month and at that point the catheter can be removed which will likely significantly help with the arm swelling.  If he has persistent swelling, then once the graft can be cannulated in 1 month we could obtain a fistulogram to look for central venous stenosis.       Consultants: renal, PCCM, vascular  Disposition: Home Diet recommendation:  Renal  DISCHARGE MEDICATION: Allergies as of 12/01/2021   No Known Allergies      Medication List     STOP taking these medications    carvedilol 6.25 MG tablet Commonly known as: COREG       TAKE these medications    albuterol 108 (90 Base) MCG/ACT inhaler Commonly known as: Proventil HFA Inhale 2 puffs into the lungs every 4 (four) hours as needed for wheezing or shortness of breath.   aspirin EC 81 MG tablet Take 81 mg by mouth every evening. (1700) Swallow whole.   atorvastatin 20 MG tablet Commonly known as: LIPITOR TAKE 1 TABLET BY MOUTH EVERYDAY AT BEDTIME   budesonide-formoterol 160-4.5 MCG/ACT inhaler Commonly  known as: SYMBICORT Inhale 2 puffs into the lungs in the morning. (0900)   calcium acetate 667 MG capsule Commonly known as: PHOSLO Take 667 mg by mouth 3 (three) times daily after meals. (1000, 1300 & 1800)   carboxymethylcellulose 0.5 % Soln Commonly known as: REFRESH PLUS Place 1 drop into both eyes daily as needed (dry eyes).   gabapentin 100 MG  capsule Commonly known as: NEURONTIN Take 2 capsules (200 mg total) by mouth at bedtime. What changed:  how much to take additional instructions   isoniazid 300 MG tablet Commonly known as: NYDRAZID TAKE 1 TABLET BY MOUTH EVERY DAY   memantine 5 MG tablet Commonly known as: NAMENDA Take 5 mg by mouth at bedtime. (2100)   nicotine 14 mg/24hr patch Commonly known as: NICODERM CQ - dosed in mg/24 hours Place 1 patch (14 mg total) onto the skin daily.   oxyCODONE-acetaminophen 5-325 MG tablet Commonly known as: Percocet Take 1 tablet by mouth every 6 (six) hours as needed.   pantoprazole 40 MG tablet Commonly known as: PROTONIX Take 1 tablet (40 mg total) by mouth daily.   pyridOXINE 50 MG tablet Commonly known as: VITAMIN B-6 Take 1 tablet (50 mg total) by mouth daily.   Spiriva Respimat 2.5 MCG/ACT Aers Generic drug: Tiotropium Bromide Monohydrate Inhale 2 puffs into the lungs daily.   traZODone 50 MG tablet Commonly known as: DESYREL Take 1-2 tablets (50-100 mg total) by mouth at bedtime as needed.        Follow-up Information     Angelia Mould, MD Follow up in 2 week(s).   Specialties: Vascular Surgery, Cardiology Why: Office will call you to arrange your appt (sent) Contact information: [address] [place] [address] [phone]                Discharge Exam: Filed Weights   11/30/21 0559 11/30/21 0754 11/30/21 1124  Weight: 71.8 kg 71.7 kg (P) 70.3 kg     Condition at discharge: improving  The results of significant diagnostics from this hospitalization (including imaging, microbiology, ancillary and laboratory) are listed below for reference.   Imaging Studies: PERIPHERAL VASCULAR CATHETERIZATION  Result Date: 11/29/2021 DATE OF SERVICE: 11/29/2021  PATIENT:  Anthony [name]  81 y.o. male  PRE-OPERATIVE DIAGNOSIS:  Atherosclerosis of native arteries of bilateral lower extremities causing ischemic rest pain  POST-OPERATIVE  DIAGNOSIS:  Same  PROCEDURE:  1) US guided right common femoral artery access 2) Aortogram 3) Left lower extremity angiogram with second order cannulation 4) Right lower extremity angiogram (142mL total contrast) 5) Conscious sedation (38 minutes)  SURGEON:  Yevonne Aline. Stanford Breed, MD  ASSISTANT: none  ANESTHESIA:   local and IV sedation  ESTIMATED BLOOD LOSS: minimal  LOCAL MEDICATIONS USED:  LIDOCAINE  COUNTS: confirmed correct.  PATIENT DISPOSITION:  PACU - hemodynamically stable.  Delay start of Pharmacological VTE agent (>24hrs) due to surgical blood loss or risk of bleeding: no  INDICATION FOR PROCEDURE: Yandriel Boening is a 81 y.o. male with ischemic rest pain of bilateral lower extremities with physical exam consistent with severe peripheral arterial disease. After careful discussion of risks, benefits, and alternatives the patient was offered angiography. The patient understood and wished to proceed.  OPERATIVE FINDINGS: Terminal aorta and iliac arteries: Tortuous, heavily diseased, but widely patent  Right lower extremity: Common femoral artery: heavily diseased, but widely patent Profunda femoris artery: heavily diseased, but widely patent Superficial femoral artery: heavily diseased, but widely patent until Hunter's canal where the vessel  occludes. It reconstitutes less than 2 cm distal to the occlusion in the above knee popliteal artery Popliteal artery: heavily diseased, but widely patent Anterior tibial artery: occluded Tibioperoneal trunk: heavily diseased, but widely patent Peroneal artery: occluded Posterior tibial artery: occluded Pedal circulation: no flow visualized.  Left lower extremity: Common femoral artery: heavily diseased, but widely patent Profunda femoris artery: heavily diseased, but widely patent Superficial femoral artery: heavily diseased, but widely patent Popliteal artery: heavily diseased, but widely patent Anterior tibial artery: occluded Tibioperoneal trunk: heavily diseased, but  widely patent Peroneal artery: occluded Posterior tibial artery: occluded Pedal circulation: no flow visualized.  DESCRIPTION OF PROCEDURE: After identification of the patient in the pre-operative holding area, the patient was transferred to the operating room. The patient was positioned supine on the operating room table. Anesthesia was induced. The groins was prepped and draped in standard fashion. A surgical pause was performed confirming correct patient, procedure, and operative location.  The right groin was anesthetized with subcutaneous injection of 1% lidocaine. Using ultrasound guidance, the right common femoral artery was accessed with micropuncture technique. Fluoroscopy was used to confirm cannulation over the femoral head. The 27F sheath was upsized to 68F.  A Benson wire was advanced into the distal aorta. Over the wire an omni flush catheter was advanced to the level of L2. Aortogram was performed - see above for details.  The left common iliac artery was selected with a glidewire advantage guidewire. The wire was advanced into the common femoral artery. Over the wire the omni flush catheter was advanced into the external iliac artery. Selective angiography was performed - see above for details.  Retrograde angiography was performed via the right common femoral sheath to image the right leg.  A mynx device was used to close the arteriotomy. Hemostasis was excellent upon completion.  Conscious sedation was administered with the use of IV fentanyl and midazolam under continuous physician and nurse monitoring.  Heart rate, blood pressure, and oxygen saturation were continuously monitored.  Total sedation time was 38 minutes  Upon completion of the case instrument and sharps counts were confirmed correct. The patient was transferred to the PACU in good condition. I was present for all portions of the procedure.  PLAN: Best medical therapy for PAD. No options for revascularization. Only option for pain  control is AKA or BKA.  Yevonne Aline. Stanford Breed, MD [facility] Phone Number: [phone] 11/29/2021 11:14 AM   DG CHEST PORT 1 VIEW  Result Date: 11/26/2021 CLINICAL DATA:  Hypoxia. Status post right upper arm AV graft placement. EXAM: PORTABLE CHEST 1 VIEW COMPARISON:  Chest x-ray dated November 04, 2021. FINDINGS: Unchanged tunneled right internal jugular dialysis catheter. Stable cardiomegaly and mild bibasilar atelectasis/scarring. Normal pulmonary vascularity. No focal consolidation, pleural effusion, or pneumothorax. No acute osseous abnormality. IMPRESSION: 1. Stable bibasilar atelectasis/scarring. Electronically Signed   By: Titus Dubin M.D.   On: 11/26/2021 15:01   DG CHEST PORT 1 VIEW  Result Date: 11/04/2021 CLINICAL DATA:  Shortness of breath. EXAM: PORTABLE CHEST 1 VIEW COMPARISON:  10/30/2021 FINDINGS: 0517 hours. The cardio pericardial silhouette is enlarged. Similar streaky opacities the bases compatible with atelectasis or scarring. No edema or focal airspace consolidation. Right dialysis catheter remains in place per Telemetry leads overlie the chest. IMPRESSION: Stable exam. No acute cardiopulmonary findings. Electronically Signed   By: Misty Stanley M.D.   On: 11/04/2021 07:58    Microbiology: Results for orders placed or performed during the hospital  encounter of 11/26/21  MRSA Next Gen by PCR, Nasal     Status: None   Collection Time: 11/26/21  3:43 PM   Specimen: Nasal Mucosa; Nasal Swab  Result Value Ref Range Status   MRSA by PCR Next Gen NOT DETECTED NOT DETECTED Final    Comment: (NOTE) The GeneXpert MRSA Assay (FDA approved for NASAL specimens only), is one component of a comprehensive MRSA colonization surveillance program. It is not intended to diagnose MRSA infection nor to guide or monitor treatment for MRSA infections. Test performance is not FDA approved in patients less than 52 years old. Performed at [facility], [place] [address] [place], [place] [address]   Resp Panel by RT-PCR (Flu A&B, Covid) Nasopharyngeal Swab     Status: None   Collection Time: 11/28/21  9:12 AM   Specimen: Nasopharyngeal Swab; Nasopharyngeal(NP) swabs in vial transport medium  Result Value Ref Range Status   SARS Coronavirus 2 by RT PCR NEGATIVE NEGATIVE Final    Comment: (NOTE) SARS-CoV-2 target nucleic acids are NOT DETECTED.  The SARS-CoV-2 RNA is generally detectable in upper respiratory specimens during the acute phase of infection. The lowest concentration of SARS-CoV-2 viral copies this assay can detect is 138 copies/mL. A negative result does not preclude SARS-Cov-2 infection and should not be used as the sole basis for treatment or other patient management decisions. A negative result may occur with  improper specimen collection/handling, submission of specimen other than nasopharyngeal swab, presence of viral mutation(s) within the areas targeted by this assay, and inadequate number of viral copies(<138 copies/mL). A negative result must be combined with clinical observations, patient history, and epidemiological information. The expected result is Negative.  Fact Sheet for Patients:  EntrepreneurPulse.com.au  Fact Sheet for Healthcare Providers:  IncredibleEmployment.be  This test is no t yet approved or cleared by the [place] FDA and  has been authorized for detection and/or diagnosis of SARS-CoV-2 by FDA under an Emergency Use Authorization (EUA). This EUA will remain  in effect (meaning this test can be used) for the duration of the COVID-19 declaration under Section 564(b)(1) of the Act, 21 U.S.C.section 360bbb-3(b)(1), unless the authorization is terminated  or revoked sooner.       Influenza A by PCR NEGATIVE NEGATIVE Final   Influenza B by PCR NEGATIVE NEGATIVE Final    Comment: (NOTE) The Xpert Xpress SARS-CoV-2/FLU/RSV plus assay is intended as an  aid in the diagnosis of influenza from Nasopharyngeal swab specimens and should not be used as a sole basis for treatment. Nasal washings and aspirates are unacceptable for Xpert Xpress SARS-CoV-2/FLU/RSV testing.  Fact Sheet for Patients: EntrepreneurPulse.com.au  Fact Sheet for Healthcare Providers: IncredibleEmployment.be  This test is not yet approved or cleared by the [place] FDA and has been authorized for detection and/or diagnosis of SARS-CoV-2 by FDA under an Emergency Use Authorization (EUA). This EUA will remain in effect (meaning this test can be used) for the duration of the COVID-19 declaration under Section 564(b)(1) of the Act, 21 U.S.C. section 360bbb-3(b)(1), unless the authorization is terminated or revoked.  Performed at [facility], [place] [address], [place], [place] [address]     Labs: CBC: Recent Labs  Lab 11/26/21 0820 11/26/21 1842 11/28/21 1302 11/29/21 0709  WBC  --  11.7* 12.5* 13.5*  HGB 7.1* 8.7* 7.6* 7.5*  HCT 21.0* 28.1* 24.1* 24.2*  MCV  --  89.2 84.9 86.1  PLT  --  266 301 287  Basic Metabolic Panel: Recent Labs  Lab 11/26/21 0820 11/26/21 1842 11/28/21 1302 11/29/21 0709  NA 142 133* 126* 136  K 2.8* 4.3 6.0* 4.2  CL 106 95* 90* 96*  CO2  --  21* 19* 25  GLUCOSE 59* 96 116* 88  BUN 34* 52* 81* 46*  CREATININE 5.50* 7.26* 10.13* 6.69*  CALCIUM  --  8.2* 8.3* 8.4*  MG  --  2.0  --   --   PHOS  --   --  9.5*  --    Liver Function Tests: Recent Labs  Lab 11/26/21 1842 11/28/21 1302  AST 12*  --   ALT 5  --   ALKPHOS 70  --   BILITOT 0.5  --   PROT 7.0  --   ALBUMIN 3.4* 2.8*   CBG: Recent Labs  Lab 11/30/21 1549 11/30/21 2008 12/01/21 0010 12/01/21 0409 12/01/21 0735  GLUCAP 139* 194* 142* 131* 127*    Discharge time spent: greater than 30 minutes.  Signed: Geradine Girt, DO [facility] 12/01/2021

## 2021-12-02 ENCOUNTER — Telehealth: Payer: Self-pay

## 2021-12-02 MED FILL — Heparin Sodium (Porcine) Inj 1000 Unit/ML: INTRAMUSCULAR | Qty: 10 | Status: AC

## 2021-12-02 NOTE — Telephone Encounter (Signed)
Pt's brother Anthony [name] called and then added pt's wife Anthony [name] to the call. Pt was very confused about his brother's 4 week f/u appt. Pt is currently in a SNF. After a 22 minute conversation, both pt and pt's wife are aware of pt's f/u and have no further questions/concerns at this time. ?

## 2021-12-03 ENCOUNTER — Ambulatory Visit (HOSPITAL_COMMUNITY): Payer: BLUE CROSS/BLUE SHIELD

## 2021-12-05 ENCOUNTER — Telehealth: Payer: Self-pay | Admitting: Internal Medicine

## 2021-12-05 NOTE — Telephone Encounter (Signed)
Spoke with wife and she states that the doctors have not decided on amputation at this time.  She states the patient is at rehab and if we want a hospital follow up then we can call them. ?

## 2021-12-05 NOTE — Telephone Encounter (Signed)
Patient brother [name] called in stating that patient is transitioning from rehab center and wanted Dr.Hernandez to know that while in rehab, the patient was informed that he may have to get surgery to get legs amputated due to no circulation in legs. Patient was complaining about pain in legs. [name] stated that he and patients wife wanted to speak with Dr.Hernandez regarding a second opinion on what patient should do next. ? ?[name] stated that if Dr.Hernandez needed to know anything else, she could call patients wife Pamala Hurry.  ? ?Please advise. ?

## 2021-12-06 ENCOUNTER — Ambulatory Visit (INDEPENDENT_AMBULATORY_CARE_PROVIDER_SITE_OTHER): Payer: Medicare Other | Admitting: Licensed Clinical Social Worker

## 2021-12-06 ENCOUNTER — Inpatient Hospital Stay (HOSPITAL_COMMUNITY)
Admission: EM | Admit: 2021-12-06 | Discharge: 2021-12-21 | DRG: 871 | Disposition: E | Payer: Medicare Other | Source: Skilled Nursing Facility | Attending: Critical Care Medicine | Admitting: Critical Care Medicine

## 2021-12-06 ENCOUNTER — Encounter (HOSPITAL_COMMUNITY): Payer: Self-pay | Admitting: *Deleted

## 2021-12-06 ENCOUNTER — Emergency Department (HOSPITAL_COMMUNITY): Payer: Medicare Other

## 2021-12-06 ENCOUNTER — Other Ambulatory Visit: Payer: Self-pay

## 2021-12-06 DIAGNOSIS — R4182 Altered mental status, unspecified: Secondary | ICD-10-CM | POA: Diagnosis not present

## 2021-12-06 DIAGNOSIS — E162 Hypoglycemia, unspecified: Secondary | ICD-10-CM | POA: Diagnosis present

## 2021-12-06 DIAGNOSIS — I5032 Chronic diastolic (congestive) heart failure: Secondary | ICD-10-CM | POA: Diagnosis present

## 2021-12-06 DIAGNOSIS — J440 Chronic obstructive pulmonary disease with acute lower respiratory infection: Secondary | ICD-10-CM | POA: Diagnosis present

## 2021-12-06 DIAGNOSIS — F0394 Unspecified dementia, unspecified severity, with anxiety: Secondary | ICD-10-CM | POA: Diagnosis present

## 2021-12-06 DIAGNOSIS — G9340 Encephalopathy, unspecified: Secondary | ICD-10-CM | POA: Diagnosis not present

## 2021-12-06 DIAGNOSIS — Z8615 Personal history of latent tuberculosis infection: Secondary | ICD-10-CM | POA: Diagnosis not present

## 2021-12-06 DIAGNOSIS — R54 Age-related physical debility: Secondary | ICD-10-CM | POA: Diagnosis present

## 2021-12-06 DIAGNOSIS — I2510 Atherosclerotic heart disease of native coronary artery without angina pectoris: Secondary | ICD-10-CM | POA: Diagnosis present

## 2021-12-06 DIAGNOSIS — D631 Anemia in chronic kidney disease: Secondary | ICD-10-CM | POA: Diagnosis present

## 2021-12-06 DIAGNOSIS — Z7982 Long term (current) use of aspirin: Secondary | ICD-10-CM

## 2021-12-06 DIAGNOSIS — Z8616 Personal history of COVID-19: Secondary | ICD-10-CM

## 2021-12-06 DIAGNOSIS — J189 Pneumonia, unspecified organism: Secondary | ICD-10-CM | POA: Diagnosis present

## 2021-12-06 DIAGNOSIS — J449 Chronic obstructive pulmonary disease, unspecified: Secondary | ICD-10-CM | POA: Diagnosis present

## 2021-12-06 DIAGNOSIS — Z66 Do not resuscitate: Secondary | ICD-10-CM | POA: Diagnosis not present

## 2021-12-06 DIAGNOSIS — R600 Localized edema: Secondary | ICD-10-CM

## 2021-12-06 DIAGNOSIS — I132 Hypertensive heart and chronic kidney disease with heart failure and with stage 5 chronic kidney disease, or end stage renal disease: Secondary | ICD-10-CM | POA: Diagnosis present

## 2021-12-06 DIAGNOSIS — F0390 Unspecified dementia without behavioral disturbance: Secondary | ICD-10-CM | POA: Diagnosis present

## 2021-12-06 DIAGNOSIS — Z8673 Personal history of transient ischemic attack (TIA), and cerebral infarction without residual deficits: Secondary | ICD-10-CM

## 2021-12-06 DIAGNOSIS — A419 Sepsis, unspecified organism: Principal | ICD-10-CM | POA: Diagnosis present

## 2021-12-06 DIAGNOSIS — R778 Other specified abnormalities of plasma proteins: Secondary | ICD-10-CM | POA: Diagnosis present

## 2021-12-06 DIAGNOSIS — N186 End stage renal disease: Secondary | ICD-10-CM | POA: Diagnosis present

## 2021-12-06 DIAGNOSIS — F03A4 Unspecified dementia, mild, with anxiety: Secondary | ICD-10-CM | POA: Diagnosis present

## 2021-12-06 DIAGNOSIS — I480 Paroxysmal atrial fibrillation: Secondary | ICD-10-CM | POA: Diagnosis present

## 2021-12-06 DIAGNOSIS — N2581 Secondary hyperparathyroidism of renal origin: Secondary | ICD-10-CM | POA: Diagnosis present

## 2021-12-06 DIAGNOSIS — I70223 Atherosclerosis of native arteries of extremities with rest pain, bilateral legs: Secondary | ICD-10-CM | POA: Diagnosis present

## 2021-12-06 DIAGNOSIS — R579 Shock, unspecified: Secondary | ICD-10-CM | POA: Diagnosis not present

## 2021-12-06 DIAGNOSIS — I10 Essential (primary) hypertension: Secondary | ICD-10-CM | POA: Diagnosis not present

## 2021-12-06 DIAGNOSIS — F17210 Nicotine dependence, cigarettes, uncomplicated: Secondary | ICD-10-CM | POA: Diagnosis present

## 2021-12-06 DIAGNOSIS — Z515 Encounter for palliative care: Secondary | ICD-10-CM

## 2021-12-06 DIAGNOSIS — R6521 Severe sepsis with septic shock: Secondary | ICD-10-CM | POA: Diagnosis present

## 2021-12-06 DIAGNOSIS — I739 Peripheral vascular disease, unspecified: Secondary | ICD-10-CM

## 2021-12-06 DIAGNOSIS — G9341 Metabolic encephalopathy: Secondary | ICD-10-CM | POA: Diagnosis present

## 2021-12-06 DIAGNOSIS — E872 Acidosis, unspecified: Secondary | ICD-10-CM | POA: Diagnosis present

## 2021-12-06 DIAGNOSIS — R609 Edema, unspecified: Secondary | ICD-10-CM | POA: Diagnosis not present

## 2021-12-06 DIAGNOSIS — Z8249 Family history of ischemic heart disease and other diseases of the circulatory system: Secondary | ICD-10-CM

## 2021-12-06 DIAGNOSIS — D649 Anemia, unspecified: Secondary | ICD-10-CM | POA: Diagnosis present

## 2021-12-06 DIAGNOSIS — Z9115 Patient's noncompliance with renal dialysis: Secondary | ICD-10-CM

## 2021-12-06 DIAGNOSIS — Z7951 Long term (current) use of inhaled steroids: Secondary | ICD-10-CM

## 2021-12-06 DIAGNOSIS — Z992 Dependence on renal dialysis: Secondary | ICD-10-CM

## 2021-12-06 DIAGNOSIS — Z993 Dependence on wheelchair: Secondary | ICD-10-CM | POA: Diagnosis not present

## 2021-12-06 DIAGNOSIS — Z227 Latent tuberculosis: Secondary | ICD-10-CM | POA: Diagnosis present

## 2021-12-06 DIAGNOSIS — J9601 Acute respiratory failure with hypoxia: Secondary | ICD-10-CM | POA: Diagnosis present

## 2021-12-06 DIAGNOSIS — E8770 Fluid overload, unspecified: Secondary | ICD-10-CM | POA: Diagnosis present

## 2021-12-06 DIAGNOSIS — E785 Hyperlipidemia, unspecified: Secondary | ICD-10-CM | POA: Diagnosis present

## 2021-12-06 DIAGNOSIS — Z7189 Other specified counseling: Secondary | ICD-10-CM | POA: Diagnosis not present

## 2021-12-06 DIAGNOSIS — Z79899 Other long term (current) drug therapy: Secondary | ICD-10-CM

## 2021-12-06 LAB — COMPREHENSIVE METABOLIC PANEL
ALT: 5 U/L (ref 0–44)
AST: 33 U/L (ref 15–41)
Albumin: 2.9 g/dL — ABNORMAL LOW (ref 3.5–5.0)
Alkaline Phosphatase: 69 U/L (ref 38–126)
Anion gap: 18 — ABNORMAL HIGH (ref 5–15)
BUN: 47 mg/dL — ABNORMAL HIGH (ref 8–23)
CO2: 24 mmol/L (ref 22–32)
Calcium: 7.8 mg/dL — ABNORMAL LOW (ref 8.9–10.3)
Chloride: 94 mmol/L — ABNORMAL LOW (ref 98–111)
Creatinine, Ser: 8.63 mg/dL — ABNORMAL HIGH (ref 0.61–1.24)
GFR, Estimated: 6 mL/min — ABNORMAL LOW (ref >60)
Glucose, Bld: 96 mg/dL (ref 70–99)
Potassium: 5.2 mmol/L — ABNORMAL HIGH (ref 3.5–5.1)
Sodium: 136 mmol/L (ref 135–145)
Total Bilirubin: 0.6 mg/dL (ref 0.3–1.2)
Total Protein: 6.5 g/dL (ref 6.5–8.1)

## 2021-12-06 LAB — GLUCOSE, CAPILLARY
Glucose-Capillary: 47 mg/dL — ABNORMAL LOW (ref 70–99)
Glucose-Capillary: 97 mg/dL (ref 70–99)
Glucose-Capillary: 109 mg/dL — ABNORMAL HIGH (ref 70–99)

## 2021-12-06 LAB — RESP PANEL BY RT-PCR (FLU A&B, COVID) ARPGX2
Influenza A by PCR: NEGATIVE
Influenza B by PCR: NEGATIVE
SARS Coronavirus 2 by RT PCR: NEGATIVE

## 2021-12-06 LAB — I-STAT VENOUS BLOOD GAS, ED
Acid-Base Excess: 5 mmol/L — ABNORMAL HIGH (ref 0.0–2.0)
Bicarbonate: 27.8 mmol/L (ref 20.0–28.0)
Calcium, Ion: 0.87 mmol/L — CL (ref 1.15–1.40)
HCT: 24 % — ABNORMAL LOW (ref 39.0–52.0)
Hemoglobin: 8.2 g/dL — ABNORMAL LOW (ref 13.0–17.0)
O2 Saturation: 82 %
Potassium: 4.9 mmol/L (ref 3.5–5.1)
Sodium: 135 mmol/L (ref 135–145)
TCO2: 29 mmol/L (ref 22–32)
pCO2, Ven: 33.2 mmHg — ABNORMAL LOW (ref 44–60)
pH, Ven: 7.53 — ABNORMAL HIGH (ref 7.25–7.43)
pO2, Ven: 41 mmHg (ref 32–45)

## 2021-12-06 LAB — TSH: TSH: 0.707 u[IU]/mL (ref 0.350–4.500)

## 2021-12-06 LAB — CBC WITH DIFFERENTIAL/PLATELET
Abs Immature Granulocytes: 0.1 10*3/uL — ABNORMAL HIGH (ref 0.00–0.07)
Basophils Absolute: 0 10*3/uL (ref 0.0–0.1)
Basophils Relative: 0 %
Eosinophils Absolute: 0 10*3/uL (ref 0.0–0.5)
Eosinophils Relative: 0 %
HCT: 23.1 % — ABNORMAL LOW (ref 39.0–52.0)
Hemoglobin: 7.1 g/dL — ABNORMAL LOW (ref 13.0–17.0)
Immature Granulocytes: 1 %
Lymphocytes Relative: 5 %
Lymphs Abs: 0.7 10*3/uL (ref 0.7–4.0)
MCH: 27.3 pg (ref 26.0–34.0)
MCHC: 30.7 g/dL (ref 30.0–36.0)
MCV: 88.8 fL (ref 80.0–100.0)
Monocytes Absolute: 1.3 10*3/uL — ABNORMAL HIGH (ref 0.1–1.0)
Monocytes Relative: 10 %
Neutro Abs: 10.8 10*3/uL — ABNORMAL HIGH (ref 1.7–7.7)
Neutrophils Relative %: 84 %
Platelets: 166 10*3/uL (ref 150–400)
RBC: 2.6 MIL/uL — ABNORMAL LOW (ref 4.22–5.81)
RDW: 20.7 % — ABNORMAL HIGH (ref 11.5–15.5)
WBC: 12.9 10*3/uL — ABNORMAL HIGH (ref 4.0–10.5)
nRBC: 0.6 % — ABNORMAL HIGH (ref 0.0–0.2)

## 2021-12-06 LAB — CBC
HCT: 26.7 % — ABNORMAL LOW (ref 39.0–52.0)
Hemoglobin: 8.1 g/dL — ABNORMAL LOW (ref 13.0–17.0)
MCH: 27.4 pg (ref 26.0–34.0)
MCHC: 30.3 g/dL (ref 30.0–36.0)
MCV: 90.2 fL (ref 80.0–100.0)
Platelets: 173 10*3/uL (ref 150–400)
RBC: 2.96 MIL/uL — ABNORMAL LOW (ref 4.22–5.81)
RDW: 21.2 % — ABNORMAL HIGH (ref 11.5–15.5)
WBC: 12.4 10*3/uL — ABNORMAL HIGH (ref 4.0–10.5)
nRBC: 0.6 % — ABNORMAL HIGH (ref 0.0–0.2)

## 2021-12-06 LAB — LACTIC ACID, PLASMA
Lactic Acid, Venous: 2.2 mmol/L (ref 0.5–1.9)
Lactic Acid, Venous: 4 mmol/L (ref 0.5–1.9)

## 2021-12-06 LAB — CBG MONITORING, ED
Glucose-Capillary: 87 mg/dL (ref 70–99)
Glucose-Capillary: 102 mg/dL — ABNORMAL HIGH (ref 70–99)

## 2021-12-06 LAB — APTT: aPTT: 36 seconds (ref 24–36)

## 2021-12-06 LAB — PROTIME-INR
INR: 1.3 — ABNORMAL HIGH (ref 0.8–1.2)
Prothrombin Time: 15.9 seconds — ABNORMAL HIGH (ref 11.4–15.2)

## 2021-12-06 LAB — TROPONIN I (HIGH SENSITIVITY): Troponin I (High Sensitivity): 255 ng/L (ref <18)

## 2021-12-06 LAB — VITAMIN B12: Vitamin B-12: 354 pg/mL (ref 180–914)

## 2021-12-06 LAB — AMMONIA: Ammonia: 27 umol/L (ref 9–35)

## 2021-12-06 MED ORDER — SODIUM CHLORIDE 0.9 % IV BOLUS
250.0000 mL | Freq: Once | INTRAVENOUS | Status: AC
Start: 2021-12-06 — End: 2021-12-06
  Administered 2021-12-06: 250 mL via INTRAVENOUS

## 2021-12-06 MED ORDER — ACETAMINOPHEN 650 MG RE SUPP
650.0000 mg | Freq: Four times a day (QID) | RECTAL | Status: DC | PRN
Start: 2021-12-06 — End: 2021-12-09
  Administered 2021-12-06: 650 mg via RECTAL
  Filled 2021-12-06: qty 1

## 2021-12-06 MED ORDER — SODIUM CHLORIDE 0.9 % IV SOLN
250.0000 mL | INTRAVENOUS | Status: DC | PRN
  Administered 2021-12-07: 250 mL via INTRAVENOUS

## 2021-12-06 MED ORDER — VANCOMYCIN HCL 750 MG/150ML IV SOLN: 750.0000 mg | INTRAVENOUS | Status: DC

## 2021-12-06 MED ORDER — SODIUM CHLORIDE 0.9% FLUSH
3.0000 mL | Freq: Two times a day (BID) | INTRAVENOUS | Status: DC
  Administered 2021-12-06 – 2021-12-08 (×5): 3 mL via INTRAVENOUS

## 2021-12-06 MED ORDER — SODIUM CHLORIDE 0.9 % IV SOLN
1.0000 g | INTRAVENOUS | Status: DC
  Filled 2021-12-06: qty 1

## 2021-12-06 MED ORDER — CHLORHEXIDINE GLUCONATE CLOTH 2 % EX PADS
6.0000 | MEDICATED_PAD | Freq: Every day | CUTANEOUS | Status: DC
  Administered 2021-12-07 (×2): 6 via TOPICAL

## 2021-12-06 MED ORDER — DEXTROSE 50 % IV SOLN
INTRAVENOUS | Status: AC
  Filled 2021-12-06: qty 50

## 2021-12-06 MED ORDER — METRONIDAZOLE 500 MG/100ML IV SOLN
500.0000 mg | Freq: Two times a day (BID) | INTRAVENOUS | Status: DC
Start: 2021-12-07 — End: 2021-12-08
  Administered 2021-12-07 – 2021-12-08 (×3): 500 mg via INTRAVENOUS
  Filled 2021-12-06 (×3): qty 100

## 2021-12-06 MED ORDER — VANCOMYCIN HCL 1500 MG/300ML IV SOLN
1500.0000 mg | Freq: Once | INTRAVENOUS | Status: AC
  Administered 2021-12-06: 1500 mg via INTRAVENOUS
  Filled 2021-12-06: qty 300

## 2021-12-06 MED ORDER — SODIUM CHLORIDE 0.9% FLUSH: 3.0000 mL | INTRAVENOUS | Status: DC | PRN

## 2021-12-06 MED ORDER — SODIUM CHLORIDE 0.9 % IV SOLN
2.0000 g | Freq: Once | INTRAVENOUS | Status: AC
  Administered 2021-12-06: 2 g via INTRAVENOUS
  Filled 2021-12-06: qty 2

## 2021-12-06 MED ORDER — DEXTROSE 50 % IV SOLN
25.0000 g | INTRAVENOUS | Status: AC
  Administered 2021-12-06: 25 g via INTRAVENOUS

## 2021-12-06 MED ORDER — HYDRALAZINE HCL 20 MG/ML IJ SOLN
10.0000 mg | Freq: Four times a day (QID) | INTRAMUSCULAR | Status: DC | PRN
  Administered 2021-12-06: 10 mg via INTRAVENOUS
  Filled 2021-12-06: qty 1

## 2021-12-06 MED ORDER — METRONIDAZOLE 500 MG/100ML IV SOLN
500.0000 mg | Freq: Once | INTRAVENOUS | Status: AC
  Administered 2021-12-06: 500 mg via INTRAVENOUS
  Filled 2021-12-06: qty 100

## 2021-12-06 MED ORDER — VANCOMYCIN HCL IN DEXTROSE 1-5 GM/200ML-% IV SOLN: 1000.0000 mg | Freq: Once | INTRAVENOUS | Status: DC

## 2021-12-06 MED ORDER — LACTATED RINGERS IV SOLN: INTRAVENOUS | Status: DC

## 2021-12-06 MED ORDER — METRONIDAZOLE 500 MG/100ML IV SOLN: 500.0000 mg | Freq: Two times a day (BID) | INTRAVENOUS | Status: DC

## 2021-12-06 MED ORDER — ACETAMINOPHEN 325 MG PO TABS: 650.0000 mg | ORAL_TABLET | Freq: Four times a day (QID) | ORAL | Status: DC | PRN

## 2021-12-06 NOTE — Assessment & Plan Note (Addendum)
Due to sepsis due to LLL pneumonia and complicated distress/tachypnea this afternoon due to severe metabolic acidosis.  ?- CCM consulted, discussed during coevaluation at bedside. Opted to transfer to ICU. Maintaining airway currently, so plan to continue BiPAP.  ?

## 2021-12-06 NOTE — Assessment & Plan Note (Addendum)
Troponin 255>delta pending. A little above baseline. 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 Cardiology recommended medical management at that time ? ?

## 2021-12-06 NOTE — Assessment & Plan Note (Addendum)
-   Not strongly suspicious that volume is causing respiratory distress. ?Echo 2/23: grade 1 DD ?

## 2021-12-06 NOTE — Assessment & Plan Note (Addendum)
-   Nephrology consulted, Dr. Augustin Coupe, underwent HD today. Will plan CRRT given persistent metabolic acidosis.  ?

## 2021-12-06 NOTE — Assessment & Plan Note (Addendum)
Hgb down below 7g/dl on recheck this PM. I ordered a unit of PRBCs. Should be able to handle volume as he'll be undergoing CRRT.  ?- FOBT, Hx of GI bleeds ?

## 2021-12-06 NOTE — ED Notes (Signed)
Verbal permission from Dr. Sabra Heck to get 18G in L ankle.  ?

## 2021-12-06 NOTE — Plan of Care (Signed)
  Problem: Clinical Measurements: Goal: Ability to maintain clinical measurements within normal limits will improve Outcome: Progressing   Problem: Clinical Measurements: Goal: Respiratory complications will improve Outcome: Progressing   Problem: Clinical Measurements: Goal: Cardiovascular complication will be avoided Outcome: Progressing   Problem: Pain Managment: Goal: General experience of comfort will improve Outcome: Progressing   Problem: Safety: Goal: Ability to remain free from injury will improve Outcome: Progressing   Problem: Skin Integrity: Goal: Risk for impaired skin integrity will decrease Outcome: Progressing   

## 2021-12-06 NOTE — Sepsis Progress Note (Signed)
Notified bedside nurse of need to draw repeat lactic acid. 

## 2021-12-06 NOTE — Assessment & Plan Note (Addendum)
No definite evidence of line or graft infection/cellulitis. No DVT on limited venous U/S, though has right UE severe swelling with collateral veins superficially forming. ?central stenosis. ?- Consulted vascular team. Appreciate their evaluation.  ?

## 2021-12-06 NOTE — Assessment & Plan Note (Signed)
Delirium precautions ?Resume namenda when more alert to safely swallow meds  ?

## 2021-12-06 NOTE — Assessment & Plan Note (Signed)
Angiography on 3/10 by Dr. Luan Pulling. Best medical therapy for PAD. No options for revascularization. Only option for pain control is AKA or BKA.? ? ? ?

## 2021-12-06 NOTE — Progress Notes (Signed)
?   12/17/2021 2030  ?Assess: MEWS Score  ?Temp (!) 100.7 ?F (38.2 ?C)  ?BP (!) 203/175  ?Pulse Rate 97  ?ECG Heart Rate 98  ?Resp 17  ?SpO2 92 %  ?O2 Device Bi-PAP  ?FiO2 (%) 40 %  ?Assess: MEWS Score  ?MEWS Temp 1  ?MEWS Systolic 2  ?MEWS Pulse 0  ?MEWS RR 0  ?MEWS LOC 1  ?MEWS Score 4  ?MEWS Score Color Red  ?Assess: if the MEWS score is Yellow or Red  ?Were vital signs taken at a resting state? Yes  ?Focused Assessment No change from prior assessment  ?Early Detection of Sepsis Score *See Row Information* High  ?MEWS guidelines implemented *See Row Information* Yes  ?Treat  ?MEWS Interventions Administered prn meds/treatments  ?Pain Score Asleep  ?Escalate  ?MEWS: Escalate Red: discuss with charge nurse/RN and provider, consider discussing with RRT  ?Notify: Charge Nurse/RN  ?Name of Charge Nurse/RN Notified Amor, RN  ?Date Charge Nurse/RN Notified 12/05/2021  ?Time Charge Nurse/RN Notified 2045  ?Notify: Provider  ?Provider Name/Title V. Rathore  ?Date Provider Notified 11/25/2021  ?Time Provider Notified 2050  ?Notification Type Page  ?Notification Reason Other (Comment) ?(Red MEWS)  ?Document  ?Patient Outcome Other (Comment) ?(continue monitoring. Other vital signs are stable.)  ?Progress note created (see row info) Yes  ? ? ?

## 2021-12-06 NOTE — Assessment & Plan Note (Addendum)
Rate controlled.  Not on anticoagulation due to recurrent GI bleeds. ?Continue telemetry  ?

## 2021-12-06 NOTE — ED Triage Notes (Signed)
Patient presents to ed via [facility] from [place] [facility] states he presented at the dialysis center from [facility] was reported to have a temp of 102.7 and was given Tylenol by NH. Upon arrival to dialysis center  patient  was lethargic patient received  albuterol and duo neb per ems.   ?

## 2021-12-06 NOTE — Assessment & Plan Note (Addendum)
-   Reported underlying dementia, though worse acutely likely due to respiratory distress. No meningismus.  ?- Treat supportively. ?

## 2021-12-06 NOTE — ED Provider Notes (Signed)
?Anthony [name] ?Provider Note ? ? ?CSN: [id number] ?Arrival date & time: 12/19/2021  1218 ? ?  ? ?History ? ?Chief Complaint  ?Patient presents with  ? Altered Mental Status  ? ? ?Anthony [name] is a 81 y.o. male w/ hx ESRD on HD presenting from dialysis today with SOB.  Coming from SNF.  Patient reportedly became hypoxic and lethargic during dialysis.  He was placed on CPAP by EMS and then switched to BiPAP upon arrival, feels that his breathing much better on that. ? ?Per my review of his medical records, patient is just discharged from the hospital 5 days ago on March 12.  He was admitted initially to critical care on dopamine for hypotension, bradycardia and hypoglycemia.  He was also noted to have acute hypertension encephalopathy which resolved.  He was also noted to have severe peripheral arterial disease, per vascular surgery consulted time and no options for vascularization, had no pedal pulses on arrival.  Patient is family appear to be undergoing consideration of an AKA or BKA.  Patient did have episodes of hypoglycemia upon arrival in the ED.  Also noted to have latent TB of the lung and is on isoniazid.  Also noted to have some dementia. ? ?HPI ? ?  ? ?Home Medications ?Prior to Admission medications   ?Medication Sig Start Date End Date Taking? Authorizing Provider  ?albuterol (PROVENTIL HFA) 108 (90 Base) MCG/ACT inhaler Inhale 2 puffs into the lungs every 4 (four) hours as needed for wheezing or shortness of breath. 03/08/20   Laurey Morale, MD  ?aspirin EC 81 MG tablet Take 81 mg by mouth every evening. (1700) Swallow whole.    [provider]  ?atorvastatin (LIPITOR) 20 MG tablet TAKE 1 TABLET BY MOUTH EVERYDAY AT BEDTIME 07/30/20   Isaac Bliss, Rayford Halsted, MD  ?budesonide-formoterol [facility]) 160-4.5 MCG/ACT inhaler Inhale 2 puffs into the lungs in the morning. (0900) 04/12/21   [provider]  ?calcium acetate (PHOSLO) 667 MG capsule Take 667  mg by mouth 3 (three) times daily after meals. (1000, 1300 & 1800)    [provider]  ?carboxymethylcellulose (REFRESH PLUS) 0.5 % SOLN Place 1 drop into both eyes daily as needed (dry eyes). 12/07/20   [provider]  ?gabapentin (NEURONTIN) 100 MG capsule Take 2 capsules (200 mg total) by mouth at bedtime. 12/01/21   Geradine Girt, DO  ?isoniazid (NYDRAZID) 300 MG tablet TAKE 1 TABLET BY MOUTH EVERY DAY 07/26/21   Tommy Medal, Lavell Islam, MD  ?memantine [facility]) 5 MG tablet Take 5 mg by mouth at bedtime. (2100) 03/12/21   [provider]  ?nicotine (NICODERM CQ - DOSED IN MG/24 HOURS) 14 mg/24hr patch Place 1 patch (14 mg total) onto the skin daily. 11/05/21 11/05/22  Raiford Noble Latif, DO  ?oxyCODONE-acetaminophen (PERCOCET) 5-325 MG tablet Take 1 tablet by mouth every 6 (six) hours as needed. 11/26/21   Rhyne, Hulen Shouts, PA-C  ?pantoprazole (PROTONIX) 40 MG tablet Take 1 tablet (40 mg total) by mouth daily. 04/24/21   Mariel Aloe, MD  ?pyridOXINE (VITAMIN B-6) 50 MG tablet Take 1 tablet (50 mg total) by mouth daily. 05/16/21 02/10/22  Truman Hayward, MD  ?Tiotropium Bromide Monohydrate (SPIRIVA RESPIMAT) 2.5 MCG/ACT AERS Inhale 2 puffs into the lungs daily. 03/15/20   Isaac Bliss, Rayford Halsted, MD  ?traZODone (DESYREL) 50 MG tablet Take 1-2 tablets (50-100 mg total) by mouth at bedtime as needed. 06/12/21   Jerilee Hoh  Everardo Beals, MD  ?   ? ?Allergies    ?Patient has no known allergies.   ? ?Review of Systems   ?Review of Systems ? ?Physical Exam ?Updated Vital Signs ?BP 119/74   Pulse 90   Resp (!) 22   Ht 5\' 7"  (1.702 m)   Wt 70.3 kg   SpO2 95%   BMI 24.27 kg/m?  ?Physical Exam ?Constitutional:   ?   General: He is not in acute distress. ?HENT:  ?   Head: Normocephalic and atraumatic.  ?Eyes:  ?   Conjunctiva/sclera: Conjunctivae normal.  ?   Pupils: Pupils are equal, round, and reactive to light.  ?Cardiovascular:  ?   Rate and Rhythm: Normal rate and regular rhythm.   ?Pulmonary:  ?   Effort: Pulmonary effort is normal. No respiratory distress.  ?   Comments: Pulmonary edema, rales on exam, patient breathing more comfortably on BiPAP ?Abdominal:  ?   General: There is no distension.  ?   Tenderness: There is no abdominal tenderness.  ?Skin: ?   General: Skin is warm and dry.  ?Neurological:  ?   General: No focal deficit present.  ?   Mental Status: He is alert. Mental status is at baseline.  ?Psychiatric:     ?   Mood and Affect: Mood normal.     ?   Behavior: Behavior normal.  ? ? ?ED Results / Procedures / Treatments   ?Labs ?(all labs ordered are listed, but only abnormal results are displayed) ?Labs Reviewed  ?RESP PANEL BY RT-PCR (FLU A&B, COVID) [id number]  ?COMPREHENSIVE METABOLIC PANEL  ?CBC WITH DIFFERENTIAL/PLATELET  ?BRAIN NATRIURETIC PEPTIDE  ?CBG MONITORING, ED  ?I-STAT VENOUS BLOOD GAS, ED  ?TROPONIN I (HIGH SENSITIVITY)  ? ? ?EKG ?EKG Interpretation ? ?Date/Time:  Friday December 06 2021 13:49:39 EDT ?Ventricular Rate:  97 ?PR Interval:  174 ?QRS Duration: 86 ?QT Interval:  382 ?QTC Calculation: 486 ?R Axis:   -65 ?Text Interpretation: Sinus or ectopic atrial rhythm Atrial premature complex Left anterior fascicular block Confirmed by Octaviano Glow [phone]) on 12/15/2021 1:52:10 PM ? ?Radiology ?DG Chest Portable 1 View ? ?Result Date: 11/23/2021 ?CLINICAL DATA:  Shortness of breath and fever. EXAM: PORTABLE CHEST 1 VIEW COMPARISON:  11/26/2021 FINDINGS: Right chest wall dialysis catheter is identified with tip in the cavoatrial junction. Aortic atherosclerotic calcifications. Stable cardiomediastinal contours. Right lung is clear. Left lung base opacity is identified which may represent atelectasis or pneumonia. IMPRESSION: Left lung base opacity which may represent atelectasis or pneumonia. Electronically Signed   By: Kerby Moors M.D.   On: 12/05/2021 13:05   ? ?Procedures ?Marland KitchenCritical Care ?Performed by: Wyvonnia Dusky, MD ?Authorized by: Wyvonnia Dusky, MD   ? ?Critical care provider statement:  ?  Critical care time (minutes):  45 ?  Critical care time was exclusive of:  Separately billable procedures and treating other patients ?  Critical care was necessary to treat or prevent imminent or life-threatening deterioration of the following conditions:  Respiratory failure ?  Critical care was time spent personally by me on the following activities:  Ordering and performing treatments and interventions, ordering and review of laboratory studies, ordering and review of radiographic studies, pulse oximetry, review of old charts, examination of patient and evaluation of patient's response to treatment ?  Care discussed with: admitting provider   ?Comments:  ?   BiPAP monitor  ? ? ?Medications Ordered in ED ?Medications - No data to display ? ?ED  Course/ Medical Decision Making/ A&P ?  ?                        ?Medical Decision Making ?Amount and/or Complexity of Data Reviewed ?Labs: ordered. ?Radiology: ordered. ?ECG/medicine tests: ordered. ? ? ?This patient presents to the ED with concern for hypoxia, lethargy. This involves an extensive number of treatment options, and is a complaint that carries with it a high risk of complications and morbidity.  The differential diagnosis includes acute pulmonary edema versus congestive heart failure versus pneumonia versus other ? ?Co-morbidities that complicate the patient evaluation: End-stage renal disease ? ?Additional history obtained from paramedics on arrival ? ?External records from outside source obtained and reviewed including hospital discharge summary from 1 week ago ? ?I ordered and personally interpreted labs, which are pending at the time of signout. ? ?I ordered imaging studies including x-ray of the chest ?I independently visualized and interpreted imaging which showed LLL opacity vs atelectasis ?I agree with the radiologist interpretation ? ?The patient was maintained on a cardiac monitor.  I personally viewed and  interpreted the cardiac monitored which showed an underlying rhythm of: NSR ? ?Per my interpretation the patient's ECG shows NSR without acute ischemic changes from prior ? ?I have reviewed the patients home medic

## 2021-12-06 NOTE — ED Notes (Signed)
ED TO INPATIENT HANDOFF REPORT ? ?ED Nurse Name and Phone #: Shirlee Limerick [phone] ? ?S ?Name/Age/Gender ?Anthony [name] ?81 y.o. ?male ?Room/Bed: 015C/015C ? ?Code Status ?  Code Status: Prior ? ?Home/SNF/Other ?Home ?Patient oriented to: self ?Is this baseline? No  ? ?Triage Complete: Triage complete  ?Chief Complaint ?Acute on chronic respiratory failure with hypoxia (HCC) [J96.21] ? ?Triage Note ?Patient presents to ed via [facility] states he presented at the dialysis center from [facility] was reported to have a temp of 102.7 and was given Tylenol by NH. Upon arrival to dialysis center  patient  was lethargic patient received  albuterol and duo neb per ems.    ? ?Allergies ?No Known Allergies ? ?Level of Care/Admitting Diagnosis ?ED Disposition   ? ? ED Disposition  ?Admit  ? Condition  ?--  ? Comment  ?Hospital Area: [facility] [[id number]] ? Level of Care: Progressive [102] ? Admit to Progressive based on following criteria: MULTISYSTEM THREATS such as stable sepsis, metabolic/electrolyte imbalance with or without encephalopathy that is responding to early treatment. ? May admit patient to [facility] or [facility] if equivalent level of care is available:: No ? Covid Evaluation: Symptomatic Person Under Investigation (PUI) or recent exposure (last 10 days) *Testing Required* ? Diagnosis: Acute on chronic respiratory failure with hypoxia ([place]) [[id number]] ? Admitting Physician: Orma Flaming [[id number]] ? Attending Physician: Orma Flaming [[id number]] ? Estimated length of stay: past midnight tomorrow ? Certification:: I certify this patient will need inpatient services for at least 2 midnights ?  ?  ? ?  ? ? ?B ?Medical/Surgery History ?Past Medical History:  ?Diagnosis Date  ? Alcohol abuse 2021  ? Anemia   ? Arthritis   ? CAD (coronary artery disease)   ? CHF (congestive heart failure) ([place])   ? Chicken pox   ? COVID 02/2021  ? Dementia ([place])   ? mild  ? ESRD (end stage  renal disease) on dialysis [facility])   ? GI bleed   ? History of blood transfusion 03/2021  ? Hypertension   ? Insomnia   ? Mitral regurgitation 05/16/2021  ? PAF (paroxysmal atrial fibrillation) ([place])   ? Pneumonia   ? Positive QuantiFERON-TB Gold test 05/16/2021  ? Stroke [facility])   ? TB lung, latent 05/16/2021  ? Vaccine counseling 11/04/2021  ? ?Past Surgical History:  ?Procedure Laterality Date  ? ABDOMINAL AORTOGRAM W/LOWER EXTREMITY Bilateral 11/29/2021  ? Procedure: ABDOMINAL AORTOGRAM W/LOWER EXTREMITY;  Surgeon: Cherre Robins, MD;  Location: [facility];  Service: Cardiovascular;  Laterality: Bilateral;  ? AV FISTULA PLACEMENT Right 05/14/2021  ? Procedure: INSERTION OF ARTERIOVENOUS (AV) GORE-TEX GRAFT ARM;  Surgeon: Angelia Mould, MD;  Location[facility] OR;  Service: Vascular;  Laterality: Right;  ? AV FISTULA PLACEMENT Right 11/26/2021  ? Procedure: INSERTION OF RIGHT UPPER ARM ARTERIOVENOUS (AV) GORE-TEX STRETCH GRAFT (4-67mmx45cm);  Surgeon: Angelia Mould, MD;  Location: [facility] OR;  Service: Vascular;  Laterality: Right;  ? ENTEROSCOPY N/A 04/18/2021  ? Procedure: ENTEROSCOPY;  Surgeon: Jackquline Denmark, MD;  Location: [facility] ENDOSCOPY;  Service: Endoscopy;  Laterality: N/A;  ? HOT HEMOSTASIS N/A 04/18/2021  ? Procedure: HOT HEMOSTASIS (ARGON PLASMA COAGULATION/BICAP);  Surgeon: Jackquline Denmark, MD;  Location: [facility] ENDOSCOPY;  Service: Endoscopy;  Laterality: N/A;  ? SUBMUCOSAL TATTOO INJECTION  04/18/2021  ? Procedure: SUBMUCOSAL TATTOO INJECTION;  Surgeon: Jackquline Denmark, MD;  Location[facility] ENDOSCOPY;  Service: Endoscopy;;  ?  ? ?A ?IV  Location/Drains/Wounds ?Patient Lines/Drains/Airways Status   ? ? Active Line/Drains/Airways   ? ? Name Placement date Placement time Site Days  ? Peripheral IV 11/25/2021 22 G 1.75" Anterior;Left Forearm 12/16/2021  1545  Forearm  less than 1  ? Peripheral IV 12/04/2021 18 G Anterior;Left Other (Comment) 11/29/2021  1714  Other (Comment)  less than 1  ? Fistula / Graft Left Upper arm  Arteriovenous vein graft 03/01/20  0833  Upper arm  645  ? Fistula / Graft Right Upper arm Arteriovenous vein graft 11/26/21  1034  Upper arm  10  ? Hemodialysis Catheter Right Internal jugular --  --  Internal jugular  --  ? Incision (Closed) 11/26/21 Arm Right 11/26/21  0905  -- 10  ? Pressure Injury 11/26/21 Sacrum Mid;Upper Stage 1 -  Intact skin with non-blanchable redness of a localized area usually over a bony prominence. 11/26/21  1548  -- 10  ? ?  ?  ? ?  ? ? ?Intake/Output Last 24 hours ?No intake or output data in the 24 hours ending 12/03/2021 1806 ? ?Labs/Imaging ?Results for orders placed or performed during the hospital encounter of 11/26/2021 (from the past 48 hour(s))  ?Troponin I (High Sensitivity)     Status: Abnormal  ? Collection Time: 12/20/2021 12:46 PM  ?Result Value Ref Range  ? Troponin I (High Sensitivity) 255 (HH) <18 ng/L  ?  Comment: CRITICAL RESULT CALLED TO, READ BACK BY AND VERIFIED WITH: ?Pebble Botkin RN [id number] 12/04/2021 BY R VERAAR ?(NOTE) ?Elevated high sensitivity troponin I (hsTnI) values and significant  ?changes across serial measurements may suggest ACS but many other  ?chronic and acute conditions are known to elevate hsTnI results.  ?Refer to the Links section for chest pain algorithms and additional  ?guidance. ?Performed at [facility], [place] [address] [place], [place] ?[address] ?  ?Resp Panel by RT-PCR (Flu A&B, Covid) Nasopharyngeal Swab     Status: None  ? Collection Time: 11/29/2021 12:46 PM  ? Specimen: Nasopharyngeal Swab; Nasopharyngeal(NP) swabs in vial transport medium  ?Result Value Ref Range  ? SARS Coronavirus 2 by RT PCR NEGATIVE NEGATIVE  ?  Comment: (NOTE) ?SARS-CoV-2 target nucleic acids are NOT DETECTED. ? ?The SARS-CoV-2 RNA is generally detectable in upper respiratory ?specimens during the acute phase of infection. The lowest ?concentration of SARS-CoV-2 viral copies this assay can detect is ?138 copies/mL. A negative result does not preclude  SARS-Cov-2 ?infection and should not be used as the sole basis for treatment or ?other patient management decisions. A negative result may occur with  ?improper specimen collection/handling, submission of specimen other ?than nasopharyngeal swab, presence of viral mutation(s) within the ?areas targeted by this assay, and inadequate number of viral ?copies(<138 copies/mL). A negative result must be combined with ?clinical observations, patient history, and epidemiological ?information. The expected result is Negative. ? ?Fact Sheet for Patients:  ?EntrepreneurPulse.com.au ? ?Fact Sheet for Healthcare Providers:  ?IncredibleEmployment.be ? ?This test is no t yet approved or cleared by the [place] FDA and  ?has been authorized for detection and/or diagnosis of SARS-CoV-2 by ?FDA under an Emergency Use Authorization (EUA). This EUA will remain  ?in effect (meaning this test can be used) for the duration of the ?COVID-19 declaration under Section 564(b)(1) of the Act, 21 ?U.S.C.section 360bbb-3(b)(1), unless the authorization is terminated  ?or revoked sooner.  ? ? ?  ? Influenza A by PCR NEGATIVE NEGATIVE  ? Influenza B by PCR NEGATIVE NEGATIVE  ?  Comment: (NOTE) ?The Xpert Xpress SARS-CoV-2/FLU/RSV plus assay is intended as an aid ?in the diagnosis of influenza from Nasopharyngeal swab specimens and ?should not be used as a sole basis for treatment. Nasal washings and ?aspirates are unacceptable for Xpert Xpress SARS-CoV-2/FLU/RSV ?testing. ? ?Fact Sheet for Patients: ?EntrepreneurPulse.com.au ? ?Fact Sheet for Healthcare Providers: ?IncredibleEmployment.be ? ?This test is not yet approved or cleared by the [place] FDA and ?has been authorized for detection and/or diagnosis of SARS-CoV-2 by ?FDA under an Emergency Use Authorization (EUA). This EUA will remain ?in effect (meaning this test can be used) for the duration of the ?COVID-19  declaration under Section 564(b)(1) of the Act, 21 U.S.C. ?section 360bbb-3(b)(1), unless the authorization is terminated or ?revoked. ? ?Performed at [facility], [place] [address], [place], [place] ?[address]

## 2021-12-06 NOTE — Assessment & Plan Note (Addendum)
Sepsis criteria with fever, tachycardia, tachypnea, leukocytosis  ?-source most likely pneumonia. ?

## 2021-12-06 NOTE — Assessment & Plan Note (Addendum)
Did develop some end-expiratory adventitious sounds. ABG repeat pending, now confirmed to have normal pCO2. ?- Continue home inhalers of spiriva and symbicort, prn albuterol. Further rec's per PCCM. ?

## 2021-12-06 NOTE — Sepsis Progress Note (Signed)
Code Sepsis protocol being monitored by eLink. 

## 2021-12-06 NOTE — Assessment & Plan Note (Addendum)
Difficult to obtain reliable blood pressure with extremity restrictions and severe PAD. Suspect A line will be necessary. With lactic acidosis, d/w CCM, they'll be giving levo if needed. ?

## 2021-12-06 NOTE — Assessment & Plan Note (Signed)
Continue b6/isoniazid when tolerating PO.  ?

## 2021-12-06 NOTE — Progress Notes (Signed)
Transport pt on BIPAP to 6E. No noted respiratory issues at this time.  ?

## 2021-12-06 NOTE — ED Provider Notes (Signed)
This patient is a chronically ill 81 year old man on renal dialysis, he dialyzed today and comes from dialysis with shortness of breath altered mental status and a reported temperature of 102 ?F or higher.  He was given Tylenol prehospital and that fever is defervesced down to 99.1.  He was placed on BiPAP and ultimately looks better with regards to his breathing however he was noted to have audible pneumonia on the x-ray, he had an ABG showing a slight alkalosis, and oxygen saturation that was low and required BiPAP. ? ?Currently on BiPAP and doing better.  Oxygen ranging up to 98% on the BiPAP.  He has a single 22-gauge in his left forearm, ? ?Due to the prehospital fever I have added cultures and a lactic acid as well as antibiotics.  He continues to be altered and is difficult to arouse but is able to arouse and protect his airway. ? ?He is currently using a dialysis catheter in his right upper chest.  The catheter appears clean the surrounding tissue is not red or erythematous or draining any pus, there is no tenderness in this area, the belly is soft, the lungs are overall clear and he does not appear to be in respiratory distress. ? ?We will need to admit for this encephalopathy which is likely related to some type of infection and given his use of dialysis I have to assume that he is at the minimum bacteremic. ? ?He does have a chronic anemia which is essentially unchanged with a hemoglobin of 7.1.  Of note his troponin is 255, most recently it was 125 when he was seen in the hospital about 5 weeks ago. ? ?EKG shows sinus rhythm, left axis deviation, poor R wave progression or prior Q waves in the anterior leads.  No signs of ST elevation or depression. ? ?I did discuss the case with Dr. Eliberto Ivory who will come see the patient for admission, I appreciate her willingness to take care of this very chronically ill and sick patient. ? ?Final diagnoses:  ?Acute encephalopathy  ?Elevated troponin  ?Anemia, unspecified  type  ? ? ?  ?Noemi Chapel, MD ?12/20/2021 1705 ? ?

## 2021-12-06 NOTE — H&P (Addendum)
?History and Physical  ? ? ?Patient: Anthony [name] ION:[id number] DOB: 12-Jul-1941 ?DOA: 11/25/2021 ?DOS: the patient was seen and examined on 12/19/2021 ?PCP: Isaac Bliss, Rayford Halsted, MD  ?Patient coming from: SNF  ? ? ?Chief Complaint: AMS/shortness of breath  ? ?HPI: Anthony [name] is a 81 y.o. male with medical history significant of CAD, COPD, diastolic CHF, ACD, dementia, ESRD on dialysis, HTN, HLD, latent TB who presented to ED after he was noted to have a fever to 102.5 at dialysis and shortness of breath>respiratory distress that required cpap. Per EMS patient was lethargic when they arrived. Given albuterol and duo nebs. Became hypoxic and placed on cpap. Did not have dialysis session today.  ? ?Recently admitted on 3/7 after RUE AV graft postprocedure was hypotensive, bradycardic and hypoglycemic and admitted to ICU. During stay also had acute HTN encephalopathy that resolved.  ? ?Patient is sleeping and will respond to sternal rub and will open eyes. Unable to answer questions.  ? ?Per wife at baseline he will answer questions, but doesn't talk a lot. He is WC bound.  ? ?ER Course:  vitals: afebrile, bp: 156/124, HR: 96, RR: 17, oxygen: 93%RA ?Pertinent labs: troponin 255>  WBC: 12.9, hgb: 7.1,  ?pH: 7.5, co2: 33.2, oxygen: 41. Labs pending/had to be redrawn.  ?CXR: left lung base opacity which may represent atelectasis or pneumonia  ?Started on vanc/cefepime/flagyl  ? ? ?Review of Systems: unable to review all systems due to the inability of the patient to answer questions. ?Past Medical History:  ?Diagnosis Date  ? Alcohol abuse 2021  ? Anemia   ? Arthritis   ? CAD (coronary artery disease)   ? CHF (congestive heart failure) ([place])   ? Chicken pox   ? COVID 02/2021  ? Dementia ([place])   ? mild  ? ESRD (end stage renal disease) on dialysis [facility])   ? GI bleed   ? History of blood transfusion 03/2021  ? Hypertension   ? Insomnia   ? Mitral regurgitation 05/16/2021  ? PAF (paroxysmal atrial fibrillation) ([place])    ? Pneumonia   ? Positive QuantiFERON-TB Gold test 05/16/2021  ? Stroke [facility])   ? TB lung, latent 05/16/2021  ? Vaccine counseling 11/04/2021  ? ?Past Surgical History:  ?Procedure Laterality Date  ? ABDOMINAL AORTOGRAM W/LOWER EXTREMITY Bilateral 11/29/2021  ? Procedure: ABDOMINAL AORTOGRAM W/LOWER EXTREMITY;  Surgeon: Cherre Robins, MD;  Location: [place] CV LAB;  Service: Cardiovascular;  Laterality: Bilateral;  ? AV FISTULA PLACEMENT Right 05/14/2021  ? Procedure: INSERTION OF ARTERIOVENOUS (AV) GORE-TEX GRAFT ARM;  Surgeon: Angelia Mould, MD;  Location: [facility] OR;  Service: Vascular;  Laterality: Right;  ? AV FISTULA PLACEMENT Right 11/26/2021  ? Procedure: INSERTION OF RIGHT UPPER ARM ARTERIOVENOUS (AV) GORE-TEX STRETCH GRAFT (4-81mmx45cm);  Surgeon: Angelia Mould, MD;  Location: [facility] OR;  Service: Vascular;  Laterality: Right;  ? ENTEROSCOPY N/A 04/18/2021  ? Procedure: ENTEROSCOPY;  Surgeon: Jackquline Denmark, MD;  Location: [facility] ENDOSCOPY;  Service: Endoscopy;  Laterality: N/A;  ? HOT HEMOSTASIS N/A 04/18/2021  ? Procedure: HOT HEMOSTASIS (ARGON PLASMA COAGULATION/BICAP);  Surgeon: Jackquline Denmark, MD;  Location: [facility] ENDOSCOPY;  Service: Endoscopy;  Laterality: N/A;  ? SUBMUCOSAL TATTOO INJECTION  04/18/2021  ? Procedure: SUBMUCOSAL TATTOO INJECTION;  Surgeon: Jackquline Denmark, MD;  Location: [facility] ENDOSCOPY;  Service: Endoscopy;;  ? ?Social History:  reports that he has been smoking cigarettes. He has a 15.25 pack-year smoking history. He has never used smokeless tobacco. He reports that  he does not currently use alcohol. He reports that he does not currently use drugs. ? ?No Known Allergies ? ?Family History  ?Problem Relation Age of Onset  ? Hypertension Mother   ? Hypertension Father   ? ? ?Prior to Admission medications   ?Medication Sig Start Date End Date Taking? Authorizing Provider  ?albuterol (PROVENTIL HFA) 108 (90 Base) MCG/ACT inhaler Inhale 2 puffs into the lungs every 4 (four) hours as needed for  wheezing or shortness of breath. 03/08/20   Laurey Morale, MD  ?aspirin EC 81 MG tablet Take 81 mg by mouth every evening. (1700) Swallow whole.    [provider]  ?atorvastatin (LIPITOR) 20 MG tablet TAKE 1 TABLET BY MOUTH EVERYDAY AT BEDTIME 07/30/20   Isaac Bliss, Rayford Halsted, MD  ?budesonide-formoterol [facility]) 160-4.5 MCG/ACT inhaler Inhale 2 puffs into the lungs in the morning. (0900) 04/12/21   [provider]  ?calcium acetate (PHOSLO) 667 MG capsule Take 667 mg by mouth 3 (three) times daily after meals. (1000, 1300 & 1800)    [provider]  ?carboxymethylcellulose (REFRESH PLUS) 0.5 % SOLN Place 1 drop into both eyes daily as needed (dry eyes). 12/07/20   [provider]  ?gabapentin (NEURONTIN) 100 MG capsule Take 2 capsules (200 mg total) by mouth at bedtime. 12/01/21   Geradine Girt, DO  ?isoniazid (NYDRAZID) 300 MG tablet TAKE 1 TABLET BY MOUTH EVERY DAY 07/26/21   Tommy Medal, Lavell Islam, MD  ?memantine [facility]) 5 MG tablet Take 5 mg by mouth at bedtime. (2100) 03/12/21   [provider]  ?nicotine (NICODERM CQ - DOSED IN MG/24 HOURS) 14 mg/24hr patch Place 1 patch (14 mg total) onto the skin daily. 11/05/21 11/05/22  Raiford Noble Latif, DO  ?oxyCODONE-acetaminophen (PERCOCET) 5-325 MG tablet Take 1 tablet by mouth every 6 (six) hours as needed. 11/26/21   Rhyne, Hulen Shouts, PA-C  ?pantoprazole (PROTONIX) 40 MG tablet Take 1 tablet (40 mg total) by mouth daily. 04/24/21   Mariel Aloe, MD  ?pyridOXINE (VITAMIN B-6) 50 MG tablet Take 1 tablet (50 mg total) by mouth daily. 05/16/21 02/10/22  Truman Hayward, MD  ?Tiotropium Bromide Monohydrate (SPIRIVA RESPIMAT) 2.5 MCG/ACT AERS Inhale 2 puffs into the lungs daily. 03/15/20   Isaac Bliss, Rayford Halsted, MD  ?traZODone (DESYREL) 50 MG tablet Take 1-2 tablets (50-100 mg total) by mouth at bedtime as needed. 06/12/21   Isaac Bliss, Rayford Halsted, MD  ? ? ?Physical Exam: ?Vitals:  ? 12/20/2021 1803 11/26/2021 1856  12/04/2021 2014 12/13/2021 2030  ?BP: 96/77 (!) 144/99 (!) 187/115 (!) 203/175  ?Pulse: 99 71  97  ?Resp: 17 17  17   ?Temp:  (!) 100.4 ?F (38 ?C) (!) 100.7 ?F (38.2 ?C) (!) 100.7 ?F (38.2 ?C)  ?TempSrc:  Axillary Oral Axillary  ?SpO2: 99%   92%  ?Weight:      ?Height:      ? ?General:  Appears calm and comfortable and is in NAD. Sleeping. Responds to sternal rub and will open eyes.  ?Eyes:  EOMI, normal lids, iris ?ENT:  grossly normal hearing, lips & tongue, mmm; appropriate dentition ?Neck:  no LAD, masses or thyromegaly; no carotid bruits ?Cardiovascular:  RRR, no m/r/g. No LE edema.  ?Respiratory:   CTA bilaterally with no wheezes/rales/rhonchi.  Normal respiratory effort. ?Abdomen:  soft, NT, ND, NABS ?Back:   normal alignment, no CVAT ?Skin:  no rash or induration seen on limited exam. Hyperpigmented macules on LE.  ?  Musculoskeletal:  no bony abnormality ?Lower extremity:  No LE edema.  Limited foot exam with no ulcerations.  Absent to thready distal pulses. Right foot cooler to touch. Small skin tear on right 5th toe. RUE edematous.  ?Psychiatric:  unable to test  ?Neurologic:  responds to sternal rub and opens eyes. ? ? ?Radiological Exams on Admission: ?Independently reviewed - see discussion in A/P where applicable ? ?DG Chest Portable 1 View ? ?Result Date: 12/11/2021 ?CLINICAL DATA:  Shortness of breath and fever. EXAM: PORTABLE CHEST 1 VIEW COMPARISON:  11/26/2021 FINDINGS: Right chest wall dialysis catheter is identified with tip in the cavoatrial junction. Aortic atherosclerotic calcifications. Stable cardiomediastinal contours. Right lung is clear. Left lung base opacity is identified which may represent atelectasis or pneumonia. IMPRESSION: Left lung base opacity which may represent atelectasis or pneumonia. Electronically Signed   By: Kerby Moors M.D.   On: 11/23/2021 13:05   ? ?EKG: Independently reviewed.  NSR with sinus arrhythmia with rate 97; nonspecific ST changes with no evidence of acute  ischemia. Pac news.  ? ? ?Labs on Admission: I have personally reviewed the available labs and imaging studies at the time of the admission. ? ?Pertinent labs:   ?troponin 255>   ?WBC: 12.9, ? hgb: 7.1,

## 2021-12-06 NOTE — Assessment & Plan Note (Signed)
Continue lipitor when alert and tolerating PO  ?

## 2021-12-06 NOTE — Progress Notes (Signed)
Pharmacy Antibiotic Note ? ?Anthony [name] is a 81 y.o. male admitted on 12/18/2021 with sepsis.  Pharmacy has been consulted for vancomycin and cefepime dosing. ? ?Patient with a history of ESRD on HD MWF. Patient presenting from dialysis with shortness of breath, hypoxia, and lethargy. Patient was recently admitted for encephalopathy. Patient requiring BIPAP. ? ?WBC 12.9; LA IP; T 99.1 F ? ?Plan: ?Metronidazole per MD ?Cefepime 1g q24hr ?Vancomycin 1500 mg once then 750 mg every MWF with dialysis ?Monitor for off schedule dialysis and adjust as indicated ?Trend WBC, Fever, Renal function, & Clinical course ?F/u cultures, clinical course, WBC, fever ?De-escalate when able ? ?Height: 5\' 7"  (170.2 cm) ?Weight: 70.3 kg (154 lb 15.7 oz) ?IBW/kg (Calculated) : 66.1 ? ?Temp (24hrs), Avg:99.1 ?F (37.3 ?C), Min:99.1 ?F (37.3 ?C), Max:99.1 ?F (37.3 ?C) ? ?Recent Labs  ?Lab 12/02/2021 ?1530  ?WBC 12.9*  ?  ?Estimated Creatinine Clearance: 8.5 mL/min (A) (by C-G formula based on SCr of 6.69 mg/dL (H)).   ? ?No Known Allergies ? ?Antimicrobials this admission: ?Flagyl 3/17 >>  ?Cefepime 3/17 >>  ?Vancomycin 317 >>  ? ?Microbiology results: ?Pending ? ?Thank you for allowing pharmacy to participate in this patient's care. ? ?Reatha Harps, PharmD ?PGY1 Pharmacy Resident ?12/07/2021 4:45 PM ?Check [email or web address] for unit specific pharmacy number ? ?  ?

## 2021-12-07 ENCOUNTER — Inpatient Hospital Stay (HOSPITAL_COMMUNITY): Payer: Medicare Other

## 2021-12-07 DIAGNOSIS — N186 End stage renal disease: Secondary | ICD-10-CM

## 2021-12-07 DIAGNOSIS — R609 Edema, unspecified: Secondary | ICD-10-CM

## 2021-12-07 DIAGNOSIS — D649 Anemia, unspecified: Secondary | ICD-10-CM

## 2021-12-07 DIAGNOSIS — J449 Chronic obstructive pulmonary disease, unspecified: Secondary | ICD-10-CM

## 2021-12-07 DIAGNOSIS — R778 Other specified abnormalities of plasma proteins: Secondary | ICD-10-CM

## 2021-12-07 DIAGNOSIS — J9601 Acute respiratory failure with hypoxia: Secondary | ICD-10-CM

## 2021-12-07 DIAGNOSIS — E872 Acidosis, unspecified: Secondary | ICD-10-CM

## 2021-12-07 DIAGNOSIS — E785 Hyperlipidemia, unspecified: Secondary | ICD-10-CM

## 2021-12-07 DIAGNOSIS — R600 Localized edema: Secondary | ICD-10-CM

## 2021-12-07 DIAGNOSIS — R4182 Altered mental status, unspecified: Secondary | ICD-10-CM | POA: Diagnosis not present

## 2021-12-07 DIAGNOSIS — A419 Sepsis, unspecified organism: Secondary | ICD-10-CM

## 2021-12-07 DIAGNOSIS — I739 Peripheral vascular disease, unspecified: Secondary | ICD-10-CM

## 2021-12-07 DIAGNOSIS — I480 Paroxysmal atrial fibrillation: Secondary | ICD-10-CM

## 2021-12-07 DIAGNOSIS — R6521 Severe sepsis with septic shock: Secondary | ICD-10-CM

## 2021-12-07 DIAGNOSIS — G9340 Encephalopathy, unspecified: Secondary | ICD-10-CM | POA: Diagnosis not present

## 2021-12-07 DIAGNOSIS — I5032 Chronic diastolic (congestive) heart failure: Secondary | ICD-10-CM

## 2021-12-07 DIAGNOSIS — Z992 Dependence on renal dialysis: Secondary | ICD-10-CM

## 2021-12-07 DIAGNOSIS — I10 Essential (primary) hypertension: Secondary | ICD-10-CM

## 2021-12-07 DIAGNOSIS — I2510 Atherosclerotic heart disease of native coronary artery without angina pectoris: Secondary | ICD-10-CM | POA: Diagnosis not present

## 2021-12-07 LAB — GLUCOSE, CAPILLARY
Glucose-Capillary: 103 mg/dL — ABNORMAL HIGH (ref 70–99)
Glucose-Capillary: 106 mg/dL — ABNORMAL HIGH (ref 70–99)
Glucose-Capillary: 95 mg/dL (ref 70–99)
Glucose-Capillary: 96 mg/dL (ref 70–99)
Glucose-Capillary: 101 mg/dL — ABNORMAL HIGH (ref 70–99)
Glucose-Capillary: <10 mg/dL — CL (ref 70–99)
Glucose-Capillary: 38 mg/dL — CL (ref 70–99)
Glucose-Capillary: 86 mg/dL (ref 70–99)
Glucose-Capillary: 61 mg/dL — ABNORMAL LOW (ref 70–99)
Glucose-Capillary: 225 mg/dL — ABNORMAL HIGH (ref 70–99)
Glucose-Capillary: 110 mg/dL — ABNORMAL HIGH (ref 70–99)
Glucose-Capillary: 114 mg/dL — ABNORMAL HIGH (ref 70–99)
Glucose-Capillary: 131 mg/dL — ABNORMAL HIGH (ref 70–99)

## 2021-12-07 LAB — BASIC METABOLIC PANEL
Anion gap: 18 — ABNORMAL HIGH (ref 5–15)
Anion gap: 25 — ABNORMAL HIGH (ref 5–15)
Anion gap: 20 — ABNORMAL HIGH (ref 5–15)
BUN: 53 mg/dL — ABNORMAL HIGH (ref 8–23)
BUN: 33 mg/dL — ABNORMAL HIGH (ref 8–23)
BUN: 31 mg/dL — ABNORMAL HIGH (ref 8–23)
CO2: 21 mmol/L — ABNORMAL LOW (ref 22–32)
CO2: 14 mmol/L — ABNORMAL LOW (ref 22–32)
CO2: 23 mmol/L (ref 22–32)
Calcium: 7.7 mg/dL — ABNORMAL LOW (ref 8.9–10.3)
Calcium: 7.6 mg/dL — ABNORMAL LOW (ref 8.9–10.3)
Calcium: 7.5 mg/dL — ABNORMAL LOW (ref 8.9–10.3)
Chloride: 99 mmol/L (ref 98–111)
Chloride: 97 mmol/L — ABNORMAL LOW (ref 98–111)
Chloride: 93 mmol/L — ABNORMAL LOW (ref 98–111)
Creatinine, Ser: 9.26 mg/dL — ABNORMAL HIGH (ref 0.61–1.24)
Creatinine, Ser: 6.67 mg/dL — ABNORMAL HIGH (ref 0.61–1.24)
Creatinine, Ser: 5.85 mg/dL — ABNORMAL HIGH (ref 0.61–1.24)
GFR, Estimated: 5 mL/min — ABNORMAL LOW (ref >60)
GFR, Estimated: 8 mL/min — ABNORMAL LOW (ref >60)
GFR, Estimated: 9 mL/min — ABNORMAL LOW (ref >60)
Glucose, Bld: 104 mg/dL — ABNORMAL HIGH (ref 70–99)
Glucose, Bld: 96 mg/dL (ref 70–99)
Glucose, Bld: 280 mg/dL — ABNORMAL HIGH (ref 70–99)
Potassium: 5.5 mmol/L — ABNORMAL HIGH (ref 3.5–5.1)
Potassium: 6.6 mmol/L (ref 3.5–5.1)
Potassium: 4.2 mmol/L (ref 3.5–5.1)
Sodium: 138 mmol/L (ref 135–145)
Sodium: 136 mmol/L (ref 135–145)
Sodium: 136 mmol/L (ref 135–145)

## 2021-12-07 LAB — POCT I-STAT 7, (LYTES, BLD GAS, ICA,H+H)
Acid-Base Excess: 2 mmol/L (ref 0.0–2.0)
Acid-Base Excess: 4 mmol/L — ABNORMAL HIGH (ref 0.0–2.0)
Bicarbonate: 26.1 mmol/L (ref 20.0–28.0)
Bicarbonate: 27.4 mmol/L (ref 20.0–28.0)
Calcium, Ion: 0.95 mmol/L — ABNORMAL LOW (ref 1.15–1.40)
Calcium, Ion: 0.99 mmol/L — ABNORMAL LOW (ref 1.15–1.40)
HCT: 24 % — ABNORMAL LOW (ref 39.0–52.0)
HCT: 29 % — ABNORMAL LOW (ref 39.0–52.0)
Hemoglobin: 8.2 g/dL — ABNORMAL LOW (ref 13.0–17.0)
Hemoglobin: 9.9 g/dL — ABNORMAL LOW (ref 13.0–17.0)
O2 Saturation: 98 %
O2 Saturation: 95 %
Patient temperature: 98.1
Patient temperature: 97
Potassium: 4.2 mmol/L (ref 3.5–5.1)
Potassium: 4.1 mmol/L (ref 3.5–5.1)
Sodium: 134 mmol/L — ABNORMAL LOW (ref 135–145)
Sodium: 137 mmol/L (ref 135–145)
TCO2: 27 mmol/L (ref 22–32)
TCO2: 28 mmol/L (ref 22–32)
pCO2 arterial: 36.2 mmHg (ref 32–48)
pCO2 arterial: 34.9 mmHg (ref 32–48)
pH, Arterial: 7.465 — ABNORMAL HIGH (ref 7.35–7.45)
pH, Arterial: 7.499 — ABNORMAL HIGH (ref 7.35–7.45)
pO2, Arterial: 96 mmHg (ref 83–108)
pO2, Arterial: 67 mmHg — ABNORMAL LOW (ref 83–108)

## 2021-12-07 LAB — LACTIC ACID, PLASMA
Lactic Acid, Venous: 1.9 mmol/L (ref 0.5–1.9)
Lactic Acid, Venous: >9 mmol/L (ref 0.5–1.9)
Lactic Acid, Venous: 2.6 mmol/L (ref 0.5–1.9)

## 2021-12-07 LAB — CBC
HCT: 23 % — ABNORMAL LOW (ref 39.0–52.0)
HCT: 27.6 % — ABNORMAL LOW (ref 39.0–52.0)
Hemoglobin: 7.1 g/dL — ABNORMAL LOW (ref 13.0–17.0)
Hemoglobin: 9 g/dL — ABNORMAL LOW (ref 13.0–17.0)
MCH: 26.9 pg (ref 26.0–34.0)
MCH: 28 pg (ref 26.0–34.0)
MCHC: 30.9 g/dL (ref 30.0–36.0)
MCHC: 32.6 g/dL (ref 30.0–36.0)
MCV: 87.1 fL (ref 80.0–100.0)
MCV: 86 fL (ref 80.0–100.0)
Platelets: 154 10*3/uL (ref 150–400)
Platelets: 189 10*3/uL (ref 150–400)
RBC: 2.64 MIL/uL — ABNORMAL LOW (ref 4.22–5.81)
RBC: 3.21 MIL/uL — ABNORMAL LOW (ref 4.22–5.81)
RDW: 20.6 % — ABNORMAL HIGH (ref 11.5–15.5)
RDW: 19.1 % — ABNORMAL HIGH (ref 11.5–15.5)
WBC: 17.1 10*3/uL — ABNORMAL HIGH (ref 4.0–10.5)
WBC: 18.2 10*3/uL — ABNORMAL HIGH (ref 4.0–10.5)
nRBC: 0.6 % — ABNORMAL HIGH (ref 0.0–0.2)
nRBC: 0.7 % — ABNORMAL HIGH (ref 0.0–0.2)

## 2021-12-07 LAB — HEMOGLOBIN AND HEMATOCRIT, BLOOD
HCT: 24.1 % — ABNORMAL LOW (ref 39.0–52.0)
Hemoglobin: 6.8 g/dL — CL (ref 13.0–17.0)

## 2021-12-07 LAB — TROPONIN I (HIGH SENSITIVITY): Troponin I (High Sensitivity): 248 ng/L (ref <18)

## 2021-12-07 LAB — PREPARE RBC (CROSSMATCH)

## 2021-12-07 MED ORDER — SODIUM CHLORIDE 0.9 % IV SOLN
2.0000 g | Freq: Two times a day (BID) | INTRAVENOUS | Status: DC
  Administered 2021-12-07 – 2021-12-08 (×2): 2 g via INTRAVENOUS
  Filled 2021-12-07 (×2): qty 2

## 2021-12-07 MED ORDER — SODIUM BICARBONATE 8.4 % IV SOLN: 50.0000 meq | Freq: Once | INTRAVENOUS | Status: AC

## 2021-12-07 MED ORDER — SODIUM CHLORIDE 0.9 % FOR CRRT: INTRAVENOUS_CENTRAL | Status: DC | PRN

## 2021-12-07 MED ORDER — DEXTROSE 50 % IV SOLN
INTRAVENOUS | Status: AC
  Administered 2021-12-07: 50 mL via INTRAVENOUS
  Filled 2021-12-07: qty 50

## 2021-12-07 MED ORDER — NOREPINEPHRINE 16 MG/250ML-% IV SOLN
0.0000 ug/min | INTRAVENOUS | Status: DC
  Administered 2021-12-08: 2 ug/min via INTRAVENOUS
  Filled 2021-12-07: qty 250

## 2021-12-07 MED ORDER — NOREPINEPHRINE 4 MG/250ML-% IV SOLN
INTRAVENOUS | Status: AC
  Administered 2021-12-07: 1 ug/min via INTRAVENOUS
  Filled 2021-12-07: qty 250

## 2021-12-07 MED ORDER — DOXYCYCLINE HYCLATE 100 MG PO TABS: 100.0000 mg | ORAL_TABLET | Freq: Two times a day (BID) | ORAL | Status: DC

## 2021-12-07 MED ORDER — PRISMASOL BGK 0/2.5 32-2.5 MEQ/L EC SOLN
Status: DC
  Filled 2021-12-07 (×5): qty 5000

## 2021-12-07 MED ORDER — DEXTROSE 50 % IV SOLN
25.0000 g | INTRAVENOUS | Status: AC
  Administered 2021-12-07: 25 g via INTRAVENOUS

## 2021-12-07 MED ORDER — SODIUM CHLORIDE 0.9% IV SOLUTION: Freq: Once | INTRAVENOUS | Status: AC

## 2021-12-07 MED ORDER — ORAL CARE MOUTH RINSE: 15.0000 mL | Freq: Two times a day (BID) | OROMUCOSAL | Status: DC

## 2021-12-07 MED ORDER — CALCIUM GLUCONATE-NACL 1-0.675 GM/50ML-% IV SOLN
1.0000 g | Freq: Once | INTRAVENOUS | Status: AC
  Administered 2021-12-07: 1000 mg via INTRAVENOUS
  Filled 2021-12-07: qty 50

## 2021-12-07 MED ORDER — SODIUM CHLORIDE 0.9 % IV SOLN: 2.0000 g | Freq: Two times a day (BID) | INTRAVENOUS | Status: DC

## 2021-12-07 MED ORDER — FENTANYL CITRATE PF 50 MCG/ML IJ SOSY
25.0000 ug | PREFILLED_SYRINGE | Freq: Once | INTRAMUSCULAR | Status: AC
Start: 2021-12-07 — End: 2021-12-07

## 2021-12-07 MED ORDER — SODIUM CHLORIDE 0.9 % IV SOLN
100.0000 mg | Freq: Two times a day (BID) | INTRAVENOUS | Status: DC
  Administered 2021-12-07 – 2021-12-08 (×2): 100 mg via INTRAVENOUS
  Filled 2021-12-07 (×3): qty 100

## 2021-12-07 MED ORDER — SODIUM BICARBONATE 8.4 % IV SOLN
INTRAVENOUS | Status: AC
  Administered 2021-12-07: 50 meq via INTRAVENOUS
  Filled 2021-12-07: qty 50

## 2021-12-07 MED ORDER — HEPARIN SODIUM (PORCINE) 1000 UNIT/ML DIALYSIS
1000.0000 [IU] | INTRAMUSCULAR | Status: DC | PRN
  Filled 2021-12-07: qty 6
  Filled 2021-12-07: qty 2
  Filled 2021-12-07 (×2): qty 6

## 2021-12-07 MED ORDER — NOREPINEPHRINE 4 MG/250ML-% IV SOLN: 0.0000 ug/min | INTRAVENOUS | Status: DC

## 2021-12-07 MED ORDER — HYDRALAZINE HCL 20 MG/ML IJ SOLN
10.0000 mg | INTRAMUSCULAR | Status: DC | PRN
  Administered 2021-12-07 – 2021-12-08 (×2): 10 mg via INTRAVENOUS
  Filled 2021-12-07 (×2): qty 1

## 2021-12-07 MED ORDER — VANCOMYCIN HCL 750 MG/150ML IV SOLN
750.0000 mg | INTRAVENOUS | Status: DC
  Administered 2021-12-07: 750 mg via INTRAVENOUS
  Filled 2021-12-07: qty 150

## 2021-12-07 MED ORDER — FENTANYL CITRATE PF 50 MCG/ML IJ SOSY
PREFILLED_SYRINGE | INTRAMUSCULAR | Status: AC
  Administered 2021-12-07: 25 ug via INTRAVENOUS
  Filled 2021-12-07: qty 1

## 2021-12-07 MED ORDER — MUSCLE RUB 10-15 % EX CREA
TOPICAL_CREAM | CUTANEOUS | Status: DC | PRN
  Filled 2021-12-07: qty 85

## 2021-12-07 MED ORDER — CHLORHEXIDINE GLUCONATE 0.12 % MT SOLN
15.0000 mL | Freq: Two times a day (BID) | OROMUCOSAL | Status: DC
  Administered 2021-12-07: 15 mL via OROMUCOSAL

## 2021-12-07 MED ORDER — HEPARIN SODIUM (PORCINE) 1000 UNIT/ML IJ SOLN
INTRAMUSCULAR | Status: AC
  Administered 2021-12-08: 3800 [IU] via INTRAVENOUS_CENTRAL
  Filled 2021-12-07: qty 4

## 2021-12-07 MED ORDER — DARBEPOETIN ALFA 150 MCG/0.3ML IJ SOSY: 150.0000 ug | PREFILLED_SYRINGE | INTRAMUSCULAR | Status: DC

## 2021-12-07 MED ORDER — PRISMASOL BGK 0/2.5 32-2.5 MEQ/L EC SOLN
Status: DC
  Filled 2021-12-07 (×3): qty 5000

## 2021-12-07 MED ORDER — DEXTROSE 50 % IV SOLN
1.0000 | Freq: Once | INTRAVENOUS | Status: AC
  Administered 2021-12-07: 50 mL via INTRAVENOUS

## 2021-12-07 MED ORDER — DEXTROSE 50 % IV SOLN: 50.0000 mL | Freq: Once | INTRAVENOUS | Status: AC

## 2021-12-07 MED ORDER — PRISMASOL BGK 0/2.5 32-2.5 MEQ/L EC SOLN
Status: DC
  Filled 2021-12-07 (×13): qty 5000

## 2021-12-07 NOTE — Consult Note (Signed)
? ?NAME:  Anthony [name], MRN:  [id number], DOB:  May 07, 1941, LOS: 1 ?ADMISSION DATE:  11/25/2021, CONSULTATION DATE:  12/07/21 ?REFERRING MD:  Dr. Bonner Puna, CHIEF COMPLAINT:  resp distress  ? ?History of Present Illness:  ?HPI obtained from medical chart review as is limited given patient is on BiPAP ? ?81 year old male with prior hx significant for HFpEF and ESRD on iHD who was admitted to [facility] on 3/17 with acute hypoxic respiratory failure, sepsis, and suspected LLL pneumonia after presenting from dialysis with fever 102.5 with respiratory distress; he did not get dialysis.  Also some reported worsening mental status from his baseline, in which is not not very talkative.  He is wheelchair bound.  ? ?Of note, recent RUE AVG placement on 3/7 in which he required ICU stay after becoming bradycardic, hypotensive, and hypoglycemic; also noted to have critical limb lower ischemia c/w with severe PAD with recommendations for bilateral LE amputations if pain not controlled.  He was discharged back to SNF on 3/11.   Currently using R TDC for access. ? ?He was admitted and placed on broad spectrum abx- vanc/ cefepime/ flagyl.  CT head non acute.  Blood pressures had been stable and he was able to be taken off BiPAP this morning and was down to 2L [place] prior to going to iHD.  Reported UF ~1L but given 233ml back and returned on NRB in respiratory distress therefore placed back on BiPAP.  Blood pressures have been difficult to obtain/ inaccurate but last reported one 93/32, and finger CBG of 10, however patient is alert and able to follow commands on BiPAP.  CXR and ABG pending.  PCCM called for ICU assessment.  ? ?Pertinent  Medical History  ?Tobacco abuse, CAD, COPD (presumed, no PFTs), ESRD on iHD, HFpEF, MR, GIB, latent TB, ACD, HLD, PAD, dementia  ? ?Wheelchair bound at baseline and doesn't talk a lot at baseline per family  ? ?Significant Hospital Events: ?Including procedures, antibiotic start and stop dates in addition to  other pertinent events   ?3/17 admitted to [facility] with fever, sepsis, LLL PNA on BiPAP, less responsive, missed dialysis ?3/18 down to 2L [place]> iHD> decompensated/ resp distress back on BiPAP, ?hypoglycemic and softer BP ? ?Interim History / Subjective:  ?Currently on BiPAP 16/8, 30% with 500-700 TV.  O2 sat difficult to obtain pleth.  ? ?Objective   ?Blood pressure 106/85, pulse 100, temperature 98 ?F (36.7 ?C), temperature source Axillary, resp. rate 18, height 5\' 7"  (1.702 m), weight 66 kg, SpO2 96 %. ?   ?Vent Mode: BIPAP;PCV ?FiO2 (%):  [35 %-40 %] 35 % ?Set Rate:  [12 bmp] 12 bmp ?PEEP:  [7 cmH20] 7 cmH20  ? ?Intake/Output Summary (Last 24 hours) at 12/07/2021 1524 ?Last data filed at 12/05/2021 1814 ?Gross per 24 hour  ?Intake 100 ml  ?Output --  ?Net 100 ml  ? ?Filed Weights  ? 12/13/2021 1234 12/07/21 1059 12/07/21 1400  ?Weight: 70.3 kg 67.7 kg 66 kg  ? ?Examination: ?General:  Acute on chronically ill elderly male sitting upright in bed on BiPAP ?HEENT: pupils 3/reactive, full face NIV mask ?Neuro:  Awake, able to follow commands, MAE- limited in RLE ?CV: rr/ ST, unable to physically palpate LE pulses ?PULM:  not overtly labored on NIV, good volumes, CTA ?GI: soft, bs+, NT ?Extremities: cool/dry, no LE edema, RUE AVG (maturing) with +b/t- extremity edematous, old graft in LUE, right chest TDC, cachetic  ?Skin: no rashes  ? ?Labs prior to  iHD> K 5.5, bicarb 21, glucose 104, BUN /sCr 53/ 9.26, trop hs 248, WBC 17.1, Hgb 7.1, cleared lactate 4> 1.9 ? ?Am CXR> LLL pneumonia  ? ?Resolved Hospital Problem list   ? ?Assessment & Plan:  ? ?Acute hypoxic respiratory failure secondary to presumed LLL pneumonia/ CAP ?Hx COPD (presumed, no PFTs) ?- respiratory status tenuous, will transfer to ICU for close monitoring ?- continue BiPAP prn, high risk for intubation ?- ? Aspiration event- pending repeat CXR and ABG to determine oxygenation given poor SpO2 pleth ?- NPO ?- continue broad spectrum abx, adding doxycycline for  atypical coverage ?- aggressive pulm hygiene, CPT/ BD ? ?Sepsis, presumed due LLL pneumonia ?- pending sepsis workup with cultures ?- continue abx as above ?- trend WBC/ fever curve  ? ?Borderline hypotension ?- post iHD volume removal (think he was given 268ml back for ?UF ~700) vs inaccurate cuff pressures 2/2 poor vasculopath ?- monitor closely as well as mental status ?- cleared lactate on AM labs  ?- repeat CBC and BMET now ? ?ESRD on MWF iHD ?- R chest TDC for access, maturing RUE AVG (placed 3/7) ?- s/p iHD today ?- per nephrology ?- repeat BMET now ? ?Possible hypoglycemia vs poor peripheral perfusion ?- rechecking with lab draw ?- hypoglycemia protocol prn   ?- monitor closely  ? ?PAF, hx of ?- appears in NSR.  Monitor tele. No on AC due to previous GIB ? ?Hx HFpEF ?- euvolemic on exam, volume removal per iHD ? ?Elevated trop hs 255 ?Hx CAD ?- pending repeat trop hs, EKG non acute ? ?Acute on chronic anemia ?- pending repeat CBC ? ?RUE edema, r/o DVT ?- pending upper extremity duplex  ? ?Severe PAD ?- evaluated by vascular surgery last admission (3/10).  Medical management at this time, no revascularization options.  If pain uncontrolled, recs for amputation.  ? ?Hx Dementia ?Poor mobility, wheelchair bound at baseline ?? Failure to Thrive  ?- resume namenda when able to take POs ?-  this is his third admission this year, not including ER visits.  Would benefit from PMT consult.  ? ?Latent TB ?- resume isoniazid/B6 when able to take POs ? ?Best Practice (right click and "Reselect all SmartList Selections" daily)  ? ?Diet/type: NPO ?DVT prophylaxis: prophylactic heparin  ?GI prophylaxis: N/A ?Lines: Dialysis Catheter- PTA right chest TDC ?Foley:  N/A ?Code Status:  full code ?Last date of multidisciplinary goals of care discussion [pending] ? ?Labs   ?CBC: ?Recent Labs  ?Lab 11/22/2021 ?1530 11/21/2021 ?1542 12/03/2021 ?2010 12/07/21 ?0352  ?WBC 12.9*  --  12.4* 17.1*  ?NEUTROABS 10.8*  --   --   --   ?HGB 7.1*  8.2* 8.1* 7.1*  ?HCT 23.1* 24.0* 26.7* 23.0*  ?MCV 88.8  --  90.2 87.1  ?PLT 166  --  173 154  ? ? ?Basic Metabolic Panel: ?Recent Labs  ?Lab 11/30/2021 ?1542 12/02/2021 ?1711 12/07/21 ?0352  ?NA 135 136 138  ?K 4.9 5.2* 5.5*  ?CL  --  94* 99  ?CO2  --  24 21*  ?GLUCOSE  --  96 104*  ?BUN  --  47* 53*  ?CREATININE  --  8.63* 9.26*  ?CALCIUM  --  7.8* 7.7*  ? ?GFR: ?Estimated Creatinine Clearance: 6.1 mL/min (A) (by C-G formula based on SCr of 9.26 mg/dL (H)). ?Recent Labs  ?Lab 11/30/2021 ?1530 12/05/2021 ?1711 12/19/2021 ?1942 12/04/2021 ?2010 12/07/21 ?0352  ?WBC 12.9*  --   --  12.4* 17.1*  ?LATICACIDVEN  --  2.2* 4.0*  --  1.9  ? ? ?Liver Function Tests: ?Recent Labs  ?Lab 11/22/2021 ?1711  ?AST 33  ?ALT 5  ?ALKPHOS 69  ?BILITOT 0.6  ?PROT 6.5  ?ALBUMIN 2.9*  ? ?No results for input(s): LIPASE, AMYLASE in the last 168 hours. ?Recent Labs  ?Lab 12/14/2021 ?1942  ?AMMONIA 27  ? ? ?ABG ?   ?Component Value Date/Time  ? PHART 7.441 08/17/2021 1919  ? PCO2ART 42.4 08/17/2021 1919  ? PO2ART 79.3 (L) 08/17/2021 1919  ? HCO3 27.8 12/13/2021 1542  ? TCO2 29 12/19/2021 1542  ? O2SAT 82 12/07/2021 1542  ?  ? ?Coagulation Profile: ?Recent Labs  ?Lab 12/14/2021 ?1711  ?INR 1.3*  ? ? ?Cardiac Enzymes: ?No results for input(s): CKTOTAL, CKMB, CKMBINDEX, TROPONINI in the last 168 hours. ? ?HbA1C: ?No results found for: HGBA1C ? ?CBG: ?Recent Labs  ?Lab 12/07/21 ?0111 12/07/21 ?0419 12/07/21 ?1655 12/07/21 ?3748 12/07/21 ?1439  ?GLUCAP 103* 106* 96 101* <10*  ? ? ?Review of Systems:   ?Limited, unable given patient is on BiPAP ? ?Past Medical History:  ?He,  has a past medical history of Alcohol abuse (2021), Anemia, Arthritis, CAD (coronary artery disease), CHF (congestive heart failure) ([place]), Chicken pox, COVID (02/2021), Dementia ([place]), ESRD (end stage renal disease) on dialysis [facility]), GI bleed, History of blood transfusion (03/2021), Hypertension, Insomnia, Mitral regurgitation (05/16/2021), PAF (paroxysmal atrial fibrillation) ([place]),  Pneumonia, Positive QuantiFERON-TB Gold test (05/16/2021), Stroke ([place]), TB lung, latent (05/16/2021), and Vaccine counseling (11/04/2021).  ? ?Surgical History:  ? ?Past Surgical History:  ?Procedure Laterality Date

## 2021-12-07 NOTE — Procedures (Signed)
Central Venous Catheter Insertion Procedure Note ? ?Anthony [name]  ?[id number]  ?1940-10-17 ? ?Date:12/07/21  ?Time:6:07 PM  ? ?Provider Performing:Jamari Diana Naomie Dean  ? ?Procedure: Insertion of Non-tunneled Central Venous Catheter(36556) with US guidance (71836)  ? ?Indication(s) ?Medication administration and Difficult access ? ?Consent ?Risks of the procedure as well as the alternatives and risks of each were explained to the patient and/or caregiver.  Consent for the procedure was obtained and is signed in the bedside chart ? ?Anesthesia ?Topical only with 1% lidocaine  ? ?Timeout ?Verified patient identification, verified procedure, site/side was marked, verified correct patient position, special equipment/implants available, medications/allergies/relevant history reviewed, required imaging and test results available. ? ?Sterile Technique ?Maximal sterile technique including full sterile barrier drape, hand hygiene, sterile gown, sterile gloves, mask, hair covering, sterile ultrasound probe cover (if used). ? ?Procedure Description ?Area of catheter insertion was cleaned with chlorhexidine and draped in sterile fashion.  With real-time ultrasound guidance a central venous catheter was placed into the right femoral vein. Nonpulsatile blood flow and easy flushing noted in all ports.  The catheter was sutured in place and sterile dressing applied. ? ?Complications/Tolerance ?None; patient tolerated the procedure well. ?Chest X-ray is ordered to verify placement for internal jugular or subclavian cannulation.   Chest x-ray is not ordered for femoral cannulation. ? ?EBL ?Minimal ? ?Specimen(s) ?None ? ?[name], DO 12/07/21 6:07 PM ?[facility] ? ?

## 2021-12-07 NOTE — Progress Notes (Incomplete)
Hypoglycemic Event ? ?CBG: *** ? ?Treatment: {Hypoglycemic Treatment (must also place order and document administration):3049002} ? ?Symptoms: {Hypoglycemic Symptoms:3049003} ? ?Follow-up CBG: Time:*** CBG Result:*** ? ?Possible Reasons for Event: {Possible Reasons for Event:3049004} ? ?Comments/MD notified:*** ? ? ? ?Nechama Guard ? ? ?

## 2021-12-07 NOTE — Progress Notes (Signed)
Patient returned from dialysis on a non-rebreather mask. Increased work of breathing. Dr Bonner Puna came to bedside, called RT and rapid.   ?

## 2021-12-07 NOTE — Progress Notes (Signed)
Anthony [name] has become more hypotensive but has questionable Bps from legs with known PAD. Labs returned-- K+ hemolyzed 6.6, bicarb 14. STAT repeat BMP and LA now. D/w nephrology- would likely need CRRT for acidosis and prefer this to bicarb gtt. ? ?D/w wife and patient's brother on 3-way phone call. Phone consent for Aline and CVC obtained over the phone, RN verified. ? ?[name], DO 12/07/21 5:05 PM ?[facility] ? ? ? ? ?

## 2021-12-07 NOTE — Procedures (Signed)
Arterial Catheter Insertion Procedure Note ? ?Anthony [name]  ?[id number]  ?02-06-1941 ? ?Date:12/07/21  ?Time:6:08 PM  ? ? ?Provider Performing: [name]  ? ? ?Procedure: Insertion of Arterial Line [id number]) with US guidance (44975)  ? ?Indication(s) ?Blood pressure monitoring and/or need for frequent ABGs ? ?Consent ?Risks of the procedure as well as the alternatives and risks of each were explained to the patient and/or caregiver.  Consent for the procedure was obtained and is signed in the bedside chart ? ?Anesthesia ?None ? ? ?Time Out ?Verified patient identification, verified procedure, site/side was marked, verified correct patient position, special equipment/implants available, medications/allergies/relevant history reviewed, required imaging and test results available. ? ? ?Sterile Technique ?Maximal sterile technique including full sterile barrier drape, hand hygiene, sterile gown, sterile gloves, mask, hair covering, sterile ultrasound probe cover (if used). ? ? ?Procedure Description ?Area of catheter insertion was cleaned with chlorhexidine and draped in sterile fashion. With real-time ultrasound guidance an arterial catheter was placed into the right femoral artery.  Appropriate arterial tracings confirmed on monitor.   ? ? ?Complications/Tolerance ?None; patient tolerated the procedure well. ? ? ?EBL ?Minimal ? ? ?Specimen(s) ?None ? ?[name], DO 12/07/21 6:08 PM ?[facility] ? ?

## 2021-12-07 NOTE — Progress Notes (Signed)
?Progress Note ? ?Patient: Anthony [name] IEP:[id number] DOB: 1941/04/10  ?DOA: 12/16/2021  DOS: 12/07/2021  ?  ?Brief hospital course: ?Mateen Franssen is a 81 y.o. male with a history of ESRD, recent hospitalization after AVG placement, among others who presented to the ED from HD where he was found to be febrile and in respiratory distress. Improved on BiPAP. CXR demonstrated LLL opacity. Antibiotics started broadly to include pneumonia coverage and patient was dialyzed 3/18. After initial improvement in respiratory distress, he developed severe distress again toward the end of dialysis, found to have profound metabolic acidosis, transferred to ICU. ? ?Assessment and Plan: ?* Acute respiratory failure with hypoxia ([place]) ?Due to sepsis due to LLL pneumonia and complicated distress/tachypnea this afternoon due to severe metabolic acidosis.  ?- CCM consulted, discussed during coevaluation at bedside. Opted to transfer to ICU. Maintaining airway currently, so plan to continue BiPAP.  ? ?Sepsis ([place]) ?Sepsis criteria with fever, tachycardia, tachypnea, leukocytosis  ?-source most likely pneumonia. ? ?Acute encephalopathy ?- Reported underlying dementia, though worse acutely likely due to respiratory distress. No meningismus.  ?- Treat supportively. ? ?Elevated troponin with hx of CAD  ?Troponin 255>delta pending. A little above baseline. 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 Cardiology recommended medical management at that time ? ? ?Acute on chronic anemia ?Hgb down below 7g/dl on recheck this PM. I ordered a unit of PRBCs. Should be able to handle volume as he'll be  undergoing CRRT.  ?- FOBT, Hx of GI bleeds ? ?ESRD (end stage renal disease) on dialysis [facility]) ?- Nephrology consulted, Dr. Augustin Coupe, underwent HD today. Will plan CRRT given persistent metabolic acidosis.  ? ?PAF (paroxysmal atrial fibrillation) ([place]) ?Rate controlled.  Not on anticoagulation due to recurrent GI bleeds. ?Continue telemetry  ? ?PAD (peripheral artery disease) ([place]) ?Angiography on 3/10 by Dr. Luan Pulling. Best medical therapy for PAD. No options for revascularization. Only option for pain control is AKA or BKA.  ? ? ? ?HTN (hypertension) ?Difficult to obtain reliable blood pressure with extremity restrictions and severe PAD. Suspect A line will be necessary. With lactic acidosis, d/w CCM, they'll be giving levo if needed. ? ?Edema of left upper arm ?No definite evidence of line or graft infection/cellulitis. No DVT on limited venous U/S, though has right UE severe swelling with collateral veins superficially forming. ?central stenosis. ?- Consulted vascular team. Appreciate their evaluation.  ? ?Chronic diastolic heart failure ([place]) ?- Not strongly suspicious that volume is causing respiratory distress. ?Echo 2/23: grade 1 DD ? ?COPD (chronic obstructive pulmonary disease) ([place]) ?Did develop some end-expiratory adventitious sounds. ABG repeat pending, now confirmed to have normal pCO2. ?- Continue home inhalers of spiriva and symbicort, prn albuterol. Further rec's per PCCM. ? ?Dementia without behavioral disturbance ([place]) ?Delirium precautions ?Resume namenda when more alert to safely swallow meds  ? ?HLD (hyperlipidemia) ?Continue lipitor when alert and tolerating PO  ? ?TB lung, latent ?Continue b6/isoniazid when tolerating PO.  ? ? ?Subjective: Seen on HD complains of feeling generally fatigued, ill, some cough, no current dyspnea but an hour later developed severe shortness of breath requiring BiPAP which did help. ? ?Objective: ?Vitals:  ? 12/07/21 1745 12/07/21 1800  12/07/21 1815 12/07/21 1825  ?BP:  (!) 102/53 (!) 114/47 (!) 111/40   ?Pulse: 96 (!) 101 94 95  ?Resp: 15 15 13 19   ?Temp:      ?TempSrc:      ?SpO2: 97% 99% 100% 100%  ?Weight:      ?Height:      ? ?Gen: Frail 81 y.o. male in no distress ?Pulm: Labored tachypnea on BiPAP, upper airway sounds throughout, with suggestion of end-expiratory wheezes.  ?CV: Regular tachycardia without murmur, rub, or gallop. No JVD, minimal dependent edema. ?GI: Abdomen soft, non-tender, non-distended, with normoactive bowel sounds.  ?Ext: Warm, no deformities. +LUE palpable pulse. RUE diffusely edematous with fair cap refill, warm and dry.  ?Skin: Boggy induration/edema in RUE without warmth or tenderness. TDC c/d/I without other rashes, lesions or ulcers on visualized skin. ?Neuro: Alert and in and out of orientation. Difficulty getting him to follow all commands. ?Psych: Judgement and insight appear impaired.   ? ?Data Personally reviewed: ? ?CBC: ?Recent Labs  ?Lab 11/26/2021 ?1530 12/01/2021 ?1542 12/04/2021 ?2010 12/07/21 ?0352 12/07/21 ?1528 12/07/21 ?1759  ?WBC 12.9*  --  12.4* 17.1*  --   --   ?NEUTROABS 10.8*  --   --   --   --   --   ?HGB 7.1* 8.2* 8.1* 7.1* 6.8* 8.2*  ?HCT 23.1* 24.0* 26.7* 23.0* 24.1* 24.0*  ?MCV 88.8  --  90.2 87.1  --   --   ?PLT 166  --  173 154  --   --   ? ?Basic Metabolic Panel: ?Recent Labs  ?Lab 12/07/2021 ?1542 12/19/2021 ?1711 12/07/21 ?0352 12/07/21 ?1531 12/07/21 ?1759  ?NA 135 136 138 136 134*  ?K 4.9 5.2* 5.5* 6.6* 4.2  ?CL  --  94* 99 97*  --   ?CO2  --  24 21* 14*  --   ?GLUCOSE  --  96 104* 96  --   ?BUN  --  47* 53* 33*  --   ?CREATININE  --  8.63* 9.26* 6.67*  --   ?CALCIUM  --  7.8* 7.7* 7.6*  --   ? ?GFR: ?Estimated Creatinine Clearance: 8.5 mL/min (A) (by C-G formula based on SCr of 6.67 mg/dL (H)). ?Liver Function Tests: ?Recent Labs  ?Lab 12/12/2021 ?1711  ?AST 33  ?ALT 5  ?ALKPHOS 69  ?BILITOT 0.6  ?PROT 6.5  ?ALBUMIN 2.9*  ? ?No results for input(s): LIPASE, AMYLASE in the last 168 hours. ?Recent Labs  ?Lab 12/05/2021 ?1942   ?AMMONIA 27  ? ?Coagulation Profile: ?Recent Labs  ?Lab 12/17/2021 ?1711  ?INR 1.3*  ? ?Cardiac Enzymes: ?No results for input(s): CKTOTAL, CKMB, CKMBINDEX, TROPONINI in the last 168 hours. ?BNP (last 3 results) ?No results for input(s): PROBNP in the last 8760 hours. ?HbA1C: ?No results for input(s): HGBA1C in the last 72 hours. ?CBG: ?Recent Labs  ?Lab 12/07/21 ?1439 12/07/21 ?1500 12/07/21 ?1539 12/07/21 ?1707 12/07/21 ?1758  ?GLUCAP <10* 38* 86 61* 225*  ? ?Lipid Profile: ?No results for input(s): CHOL, HDL, LDLCALC, TRIG, CHOLHDL, LDLDIRECT in the last 72 hours. ?Thyroid Function Tests: ?Recent Labs  ?  11/22/2021 ?1942  ?TSH 0.707  ? ?Anemia Panel: ?Recent Labs  ?  11/21/2021 ?1942  ?VITAMINB12 354  ? ?Urine analysis: ?No results found for: COLORURINE, APPEARANCEUR, [place], [place], [name], [place], [place], KETONESUR, PROTEINUR, [place], NITRITE, LEUKOCYTESUR ?Recent Results (from the past 240 hour(s))  ?Resp Panel by RT-PCR (Flu A&B, Covid) Nasopharyngeal Swab     Status: None  ?  Collection Time: 11/28/21  9:12 AM  ? Specimen: Nasopharyngeal Swab; Nasopharyngeal(NP) swabs in vial transport medium  ?Result Value Ref Range Status  ? SARS Coronavirus 2 by RT PCR NEGATIVE NEGATIVE Final  ?  Comment: (NOTE) ?SARS-CoV-2 target nucleic acids are NOT DETECTED. ? ?The SARS-CoV-2 RNA is generally detectable in upper respiratory ?specimens during the acute phase of infection. The lowest ?concentration of SARS-CoV-2 viral copies this assay can detect is ?138 copies/mL. A negative result does not preclude SARS-Cov-2 ?infection and should not be used as the sole basis for treatment or ?other patient management decisions. A negative result may occur with  ?improper specimen collection/handling, submission of specimen other ?than nasopharyngeal swab, presence of viral mutation(s) within the ?areas targeted by this assay, and inadequate number of viral ?copies(<138 copies/mL). A negative result must be combined  with ?clinical observations, patient history, and epidemiological ?information. The expected result is Negative. ? ?Fact Sheet for Patients:  ?EntrepreneurPulse.com.au ? ?Fact Sheet for Healthcare Pr

## 2021-12-07 NOTE — Significant Event (Signed)
Rapid Response Event Note  ? ?Reason for Call :  ?Respiratory distress ? ?Initial Focused Assessment:  ?Received a call stating that this patient had to sign off  HD early because of worsening respiratory status and he was hypotensive. Patient was returned to his room and placed back on bipap.  ? ?His CBG read <10 and 1 amp of D50 given to patient. His recheck was 38 and he was given another amp of D50. The 3rd recheck was 86. Patient is currently NPO, due to him being on bipap.  ? ?Accurate BP readings are tenuous due to his limited upper extremity access, so BP readings obtained from LE.  ? ?BP 96/21 ?HR 104 ?O2 96% ?RR 30 ? ?Plan of Care:  ?Patient was transferred to CVICU for closer monitoring. Discussion of an extra treatment to lower K (6.6) as well as pull fluid on this patient.  ? ? ?Event Summary:  ? ?MD Notified: Dr. Bonner Puna, CCM, and [facility] NP Nephrology are bedside.  ?Call Time: 1423 ?Arrival Time: 0258 ?End Time: 1630 ? ?Venetia Maxon, RN ?

## 2021-12-07 NOTE — Consult Note (Signed)
[facility] ?Renal Consultation Note  ?  ?Indication for Consultation:  Management of ESRD/hemodialysis; anemia, hypertension/volume and secondary hyperparathyroidism ? ?HCW:CBJSEGBTD Everardo Beals, MD ? ?HPI: Anthony [name] is a 81 y.o. male with ESRD on HD MWF at [facility]. His past medical history significant for CAD, COPD, diastolic CHF, ACD, dementia, ESRD on dialysis, HTN, HLD, and latent TB. ? ?Reviewed records in Epic and outpatient HD center. [facility] ([place] was notified of patient having a temp of 102 at SNF. He presented to [place] lethargic with noted bilateral arms and facial swelling. Patient was then sent to the ED for further evaluation. Noted previous hospitalization 11/26/21-12/01/21 d/t hypotension/bradycardia s/p RUE AVG placement on 3/7 and critical limb ischemia-found to have severe PAD. He last received full HD on 12/04/21 ? ?Seen and examined patient at bedside. Still appears lethargic. Opens eyes to voice and answers simple questions. Denies SOB and CP. Noted low-grade temp trend (axillary temps)-warm to touch. Blood cultures X 2 obtained 3/17. Empiric ABX ordered. Patient has been weaned to 2L [place] and blood pressures relatively stable. Notable labs include WBC 17.1, Hgb 7.1, Troponin 248, K+ 5.5, BUN 53, SrCr 9.26, and Ca 7.7. CXR shows L lung base opacity: atelectasis vs PNA. Plan for HD today. ? ? ? ?Past Medical History:  ?Diagnosis Date  ? Alcohol abuse 2021  ? Anemia   ? Arthritis   ? CAD (coronary artery disease)   ? CHF (congestive heart failure) ([place])   ? Chicken pox   ? COVID 02/2021  ? Dementia ([place])   ? mild  ? ESRD (end stage renal disease) on dialysis [facility])   ? GI bleed   ? History of blood transfusion 03/2021  ? Hypertension   ? Insomnia   ? Mitral regurgitation 05/16/2021  ? PAF (paroxysmal atrial fibrillation) ([place])   ? Pneumonia   ? Positive QuantiFERON-TB Gold test 05/16/2021  ? Stroke [facility])   ? TB lung, latent 05/16/2021  ?  Vaccine counseling 11/04/2021  ? ?Past Surgical History:  ?Procedure Laterality Date  ? ABDOMINAL AORTOGRAM W/LOWER EXTREMITY Bilateral 11/29/2021  ? Procedure: ABDOMINAL AORTOGRAM W/LOWER EXTREMITY;  Surgeon: Cherre Robins, MD;  Location: [facility] CV LAB;  Service: Cardiovascular;  Laterality: Bilateral;  ? AV FISTULA PLACEMENT Right 05/14/2021  ? Procedure: INSERTION OF ARTERIOVENOUS (AV) GORE-TEX GRAFT ARM;  Surgeon: Angelia Mould, MD;  Location[facility] OR;  Service: Vascular;  Laterality: Right;  ? AV FISTULA PLACEMENT Right 11/26/2021  ? Procedure: INSERTION OF RIGHT UPPER ARM ARTERIOVENOUS (AV) GORE-TEX STRETCH GRAFT (4-70mmx45cm);  Surgeon: Angelia Mould, MD;  Location: [facility] OR;  Service: Vascular;  Laterality: Right;  ? ENTEROSCOPY N/A 04/18/2021  ? Procedure: ENTEROSCOPY;  Surgeon: Jackquline Denmark, MD;  Location: [facility] ENDOSCOPY;  Service: Endoscopy;  Laterality: N/A;  ? HOT HEMOSTASIS N/A 04/18/2021  ? Procedure: HOT HEMOSTASIS (ARGON PLASMA COAGULATION/BICAP);  Surgeon: Jackquline Denmark, MD;  [facility] ENDOSCOPY;  Service: Endoscopy;  Laterality: N/A;  ? SUBMUCOSAL TATTOO INJECTION  04/18/2021  ? Procedure: SUBMUCOSAL TATTOO INJECTION;  Surgeon: Jackquline Denmark, MD;  Location: [facility] ENDOSCOPY;  Service: Endoscopy;;  ? ?Family History  ?Problem Relation Age of Onset  ? Hypertension Mother   ? Hypertension Father   ? ?Social History: ? reports that he has been smoking cigarettes. He has a 15.25 pack-year smoking history. He has never used smokeless tobacco. He reports that he does not currently use alcohol. He reports that he does not currently use drugs. ?  No Known Allergies ?Prior to Admission medications   ?Medication Sig Start Date End Date Taking? Authorizing Provider  ?acetaminophen (TYLENOL) 120 MG suppository Place 650 mg rectally once.   Yes [provider]  ?albuterol (PROVENTIL HFA) 108 (90 Base) MCG/ACT inhaler Inhale 2 puffs into the lungs every 4 (four) hours as needed for wheezing or  shortness of breath. 03/08/20  Yes Laurey Morale, MD  ?aspirin EC 81 MG tablet Take 81 mg by mouth every evening. (1700) Swallow whole.   Yes [provider]  ?atorvastatin (LIPITOR) 20 MG tablet TAKE 1 TABLET BY MOUTH EVERYDAY AT BEDTIME ?Patient taking differently: Take 20 mg by mouth at bedtime. 07/30/20  Yes Isaac Bliss, Rayford Halsted, MD  ?budesonide-formoterol [facility]) 160-4.5 MCG/ACT inhaler Inhale 2 puffs into the lungs in the morning. 04/12/21  Yes [provider]  ?calcium acetate (PHOSLO) 667 MG capsule Take 667 mg by mouth 3 (three) times daily after meals.   Yes [provider]  ?Carboxymethylcellulose Sod PF 0.5 % SOLN Place 1 drop into both eyes daily.   Yes [provider]  ?gabapentin (NEURONTIN) 100 MG capsule Take 2 capsules (200 mg total) by mouth at bedtime. 12/01/21  Yes Vann, Jessica U, DO  ?isoniazid (NYDRAZID) 300 MG tablet TAKE 1 TABLET BY MOUTH EVERY DAY ?Patient taking differently: Take 300 mg by mouth daily. 07/26/21  Yes Tommy Medal, Lavell Islam, MD  ?memantine (NAMENDA) 5 MG tablet Take 5 mg by mouth at bedtime. (2100) 03/12/21  Yes [provider]  ?nicotine (NICODERM CQ - DOSED IN MG/24 HOURS) 14 mg/24hr patch Place 1 patch (14 mg total) onto the skin daily. 11/05/21 11/05/22 Yes Sheikh, Omair Latif, DO  ?oxyCODONE-acetaminophen (PERCOCET) 5-325 MG tablet Take 1 tablet by mouth every 6 (six) hours as needed. ?Patient taking differently: Take 1 tablet by mouth every 6 (six) hours as needed for severe pain. 11/26/21  Yes Rhyne, Hulen Shouts, PA-C  ?pantoprazole (PROTONIX) 40 MG tablet Take 1 tablet (40 mg total) by mouth daily. 04/24/21  Yes Mariel Aloe, MD  ?pyridOXINE (VITAMIN B-6) 50 MG tablet Take 1 tablet (50 mg total) by mouth daily. 05/16/21 02/10/22 Yes Truman Hayward, MD  ?Tiotropium Bromide Monohydrate (SPIRIVA RESPIMAT) 2.5 MCG/ACT AERS Inhale 2 puffs into the lungs daily. 03/15/20  Yes Isaac Bliss, Rayford Halsted, MD  ?traZODone  (DESYREL) 50 MG tablet Take 1-2 tablets (50-100 mg total) by mouth at bedtime as needed. ?Patient taking differently: Take 50-100 mg by mouth at bedtime as needed for sleep. 06/12/21  Yes Isaac Bliss, Rayford Halsted, MD  ?PREDNISONE PO Take 40 mg by mouth daily.    [provider]  ? ?Current Facility-Administered Medications  ?Medication Dose Route Frequency Provider Last Rate Last Admin  ? 0.9 %  sodium chloride infusion  250 mL Intravenous PRN Orma Flaming, MD      ? acetaminophen (TYLENOL) tablet 650 mg  650 mg Oral Q6H PRN Orma Flaming, MD      ? Or  ? acetaminophen (TYLENOL) suppository 650 mg  650 mg Rectal Q6H PRN Orma Flaming, MD   650 mg at 12/13/2021 2332  ? ceFEPIme (MAXIPIME) 1 g in sodium chloride 0.9 % 100 mL IVPB  1 g Intravenous Q24H Ursula Beath, RPH      ? Chlorhexidine Gluconate Cloth 2 % PADS 6 each  6 each Topical Q0600 Dwana Melena, MD   6 each at 12/07/21 [phone]  ? hydrALAZINE (APRESOLINE) injection 10 mg  10 mg Intravenous Q6H PRN Orma Flaming, MD   10 mg at 11/29/2021 2047  ? metroNIDAZOLE (FLAGYL) IVPB 500 mg  500 mg Intravenous Q12H Ursula Beath, RPH      ? sodium chloride flush (NS) 0.9 % injection 3 mL  3 mL Intravenous Q12H Orma Flaming, MD   3 mL at 12/03/2021 2325  ? sodium chloride flush (NS) 0.9 % injection 3 mL  3 mL Intravenous PRN Orma Flaming, MD      ? Derrill Memo ON 2021/12/16] vancomycin (VANCOREADY) IVPB 750 mg/150 mL  750 mg Intravenous Q M,W,F-HD Ursula Beath, RPH      ? ?Labs: ?Basic Metabolic Panel: ?Recent Labs  ?Lab 11/25/2021 ?1542 12/04/2021 ?1711 12/07/21 ?0352  ?NA 135 136 138  ?K 4.9 5.2* 5.5*  ?CL  --  94* 99  ?CO2  --  24 21*  ?GLUCOSE  --  96 104*  ?BUN  --  47* 53*  ?CREATININE  --  8.63* 9.26*  ?CALCIUM  --  7.8* 7.7*  ? ?Liver Function Tests: ?Recent Labs  ?Lab 12/19/2021 ?1711  ?AST 33  ?ALT 5  ?ALKPHOS 69  ?BILITOT 0.6  ?PROT 6.5  ?ALBUMIN 2.9*  ? ?No results for input(s): LIPASE, AMYLASE in the last 168 hours. ?Recent Labs  ?Lab 12/07/2021 ?1942   ?AMMONIA 27  ? ?CBC: ?Recent Labs  ?Lab 12/05/2021 ?1530 12/13/2021 ?1542 11/28/2021 ?2010 12/07/21 ?0352  ?WBC 12.9*  --  12.4* 17.1*  ?NEUTROABS 10.8*  --   --   --   ?HGB 7.1* 8.2* 8.1* 7.1*  ?HCT 23.1* 24.0* 26.7* 23.0*  ?

## 2021-12-07 NOTE — Progress Notes (Addendum)
Pharmacy Antibiotic Note ? ?Anthony [name] is a 81 y.o. male admitted on 11/27/2021 with sepsis.  Pharmacy has been consulted for vancomycin and cefepime dosing. ? ?Patient with a history of ESRD on HD MWF- underwent HD session today. Has become more hypotensive with worsening respiratory status - transferred to ICU with plan for CRRT for acidosis. Last dose of vancomycin 3/17 at 1725 and cefepime at 1724. WBC 17, afebrile, LA down to 1.9 on last check.  ? ?Plan: ?Metronidazole per MD ?Change cefepime to 2g IV every 12 hours  ?Change vancomycin to 750 mg IV every 24 hours ?Monitor CRRT tolerance  ?Trend WBC, Fever, Renal function, & Clinical course ?F/u cultures, clinical course, WBC, fever ?De-escalate when able ? ?Height: 5\' 7"  (170.2 cm) ?Weight: 66 kg (145 lb 8.1 oz) ?IBW/kg (Calculated) : 66.1 ? ?Temp (24hrs), Avg:99.8 ?F (37.7 ?C), Min:98 ?F (36.7 ?C), Max:101.2 ?F (38.4 ?C) ? ?Recent Labs  ?Lab 12/17/2021 ?1530 11/27/2021 ?1711 11/23/2021 ?1942 11/30/2021 ?2010 12/07/21 ?0352 12/07/21 ?1531  ?WBC 12.9*  --   --  12.4* 17.1*  --   ?CREATININE  --  8.63*  --   --  9.26* 6.67*  ?LATICACIDVEN  --  2.2* 4.0*  --  1.9  --   ? ?  ?Estimated Creatinine Clearance: 8.5 mL/min (A) (by C-G formula based on SCr of 6.67 mg/dL (H)).   ? ?No Known Allergies ? ?Antimicrobials this admission: ?Flagyl 3/17 >>  ?Cefepime 3/17 >>  ?Vancomycin 317 >>  ? ?Microbiology results: ?Bcx 3/17: ngtd ?COVID/Flu PCR 3/17: neg  ?Ucx 3/18: ordered ? ?Thank you for allowing pharmacy to participate in this patient's care. ? ?Antonietta Jewel, PharmD, BCCCP ?Clinical Pharmacist  ?Phone: [phone] ?12/07/2021 5:27 PM ? ?Please check AMION for all [place] phone numbers ?After 10:00 PM, call [place] [phone] ? ? ?  ?

## 2021-12-07 NOTE — Progress Notes (Signed)
Upper extremity venous has been completed.  ? ?Preliminary results in CV Proc.  ? ?Anthony [name] Dollie Mayse ?12/07/2021 10:14 AM    ?

## 2021-12-07 NOTE — Consult Note (Signed)
VASC[facility]ISTS OF   ASSESSMENT / PLAN: 81 y.o. male with edematous right upper extremity after arteriovenous graft 11/26/21. The patient is in septic shock. The graft is not clearly infected clinically. There are many potential sources for sepsis including pneumonia, tunneled dialysis catheter, new AVG, old AVG. I and Dr. Chestine Spore both feel pneumonia to be most likely at the moment, but will re-evaluate as data finalizes. Recommend gentle compression and elevation of the right arm as able. Will follow along.   CHIEF COMPLAINT: altered mental status  HISTORY OF PRESENT ILLNESS: Anthony [name] is a 81 y.o. male admitted to the critical care service for septic shock and altered mental status.  Patient is well-known to our service.  He has severe peripheral vascular disease bilaterally.  He has virtually no flow below his popliteal artery to his feet.  He has end-stage renal disease, and recently underwent right upper extremity arteriovenous grafting by Dr. Edilia Bo on 11/26/2021.  He tolerated this fairly well.  He has had severe swelling in his right upper extremity.  Yesterday, dialysis, he was noted to have a fever of 102.5 and shortness of breath with respiratory distress.  He was transferred to the emergency room.  On my evaluation, the patient is acutely ill on noninvasive positive pressure ventilation.  He is not able to give any history.  Past Medical History:  Diagnosis Date   Alcohol abuse 2021   Anemia    Arthritis    CAD (coronary artery disease)    CHF (congestive heart failure) (HCC)    Chicken pox    COVID 02/2021   Dementia (HCC)    mild   ESRD (end stage renal disease) on dialysis (HCC)    GI bleed    History of blood transfusion 03/2021   Hypertension    Insomnia    Mitral regurgitation 05/16/2021   PAF (paroxysmal atrial fibrillation) (HCC)    Pneumonia    Positive QuantiFERON-TB Gold test 05/16/2021   Stroke (HCC)    TB lung, latent 05/16/2021    Vaccine counseling 11/04/2021    Past Surgical History:  Procedure Laterality Date   ABDOMINAL AORTOGRAM W/LOWER EXTREMITY Bilateral 11/29/2021   Procedure: ABDOMINAL AORTOGRAM W/LOWER EXTREMITY;  Surgeon: Leonie Douglas, MD;  Location: [facility];  Service: Cardiovascular;  Laterality: Bilateral;   AV FISTULA PLACEMENT Right 05/14/2021   Procedure: INSERTION OF ARTERIOVENOUS (AV) GORE-TEX GRAFT ARM;  Surgeon: Chuck Hint, MD;  Location: [facility] OR;  Service: Vascular;  Laterality: Right;   AV FISTULA PLACEMENT Right 11/26/2021   Procedure: INSERTION OF RIGHT UPPER ARM ARTERIOVENOUS (AV) GORE-TEX STRETCH GRAFT (4-21mmx45cm);  Surgeon: Chuck Hint, MD;  Location: [facility] OR;  Service: Vascular;  Laterality: Right;   ENTEROSCOPY N/A 04/18/2021   Procedure: ENTEROSCOPY;  Surgeon: Lynann Bologna, MD;  Location: [facility] ENDOSCOPY;  Service: Endoscopy;  Laterality: N/A;   HOT HEMOSTASIS N/A 04/18/2021   Procedure: HOT HEMOSTASIS (ARGON PLASMA COAGULATION/BICAP);  Surgeon: Lynann Bologna, MD;  Location: [facility] ENDOSCOPY;  Service: Endoscopy;  Laterality: N/A;   SUBMUCOSAL TATTOO INJECTION  04/18/2021   Procedure: SUBMUCOSAL TATTOO INJECTION;  Surgeon: Lynann Bologna, MD;  Location: [facility] ENDOSCOPY;  Service: Endoscopy;;    Family History  Problem Relation Age of Onset   Hypertension Mother    Hypertension Father     Social History   Socioeconomic History   Marital status: Married    Spouse name: Not on file   Number of children: Not on file   Years  of education: Not on file   Highest education level: Not on file  Occupational History   Not on file  Tobacco Use   Smoking status: Every Day    Packs/day: 0.25    Years: 61.00    Pack years: 15.25    Types: Cigarettes   Smokeless tobacco: Never   Tobacco comments:    Patient is not currently smoking at [facility] and [facility].  Vaping Use   Vaping Use: Never used  Substance and Sexual Activity   Alcohol use: Not Currently     Comment: quit 2021   Drug use: Not Currently   Sexual activity: Not Currently  Other Topics Concern   Not on file  Social History Narrative   Not on file   Social Determinants of Health   Financial Resource Strain: Not on file  Food Insecurity: Not on file  Transportation Needs: Not on file  Physical Activity: Not on file  Stress: Not on file  Social Connections: Not on file  Intimate Partner Violence: Not on file    No Known Allergies  Current Facility-Administered Medications  Medication Dose Route Frequency Provider Last Rate Last Admin   0.9 %  sodium chloride infusion  250 mL Intravenous PRN Orland Mustard, MD   Paused at 12/07/21 1651   acetaminophen (TYLENOL) tablet 650 mg  650 mg Oral Q6H PRN Orland Mustard, MD       Or   acetaminophen (TYLENOL) suppository 650 mg  650 mg Rectal Q6H PRN Orland Mustard, MD   650 mg at 12/06/21 2332   ceFEPIme (MAXIPIME) 2 g in sodium chloride 0.9 % 100 mL IVPB  2 g Intravenous Q12H Karie Fetch P, DO       chlorhexidine (PERIDEX) 0.12 % solution 15 mL  15 mL Mouth Rinse BID Karie Fetch P, DO       Chlorhexidine Gluconate Cloth 2 % PADS 6 each  6 each Topical Q0600 Ethelene Hal, MD   6 each at 12/07/21 0621   [START ON 12/09/2021] Darbepoetin Alfa (ARANESP) injection 150 mcg  150 mcg Intravenous Q Mon-HD Berenda Morale, NP       doxycycline (VIBRAMYCIN) 100 mg in sodium chloride 0.9 % 250 mL IVPB  100 mg Intravenous Q12H Karie Fetch P, DO       heparin injection 1,000-6,000 Units  1,000-6,000 Units CRRT PRN Ethelene Hal, MD       heparin sodium (porcine) 1000 UNIT/ML injection            hydrALAZINE (APRESOLINE) injection 10 mg  10 mg Intravenous Q6H PRN Orland Mustard, MD   10 mg at 12/06/21 2047   [START ON 12/28/2021] MEDLINE mouth rinse  15 mL Mouth Rinse q12n4p Karie Fetch P, DO       metroNIDAZOLE (FLAGYL) IVPB 500 mg  500 mg Intravenous Q12H Marygrace Drought, RPH 100 mL/hr at 12/07/21 1810 500 mg at 12/07/21 1810    norepinephrine (LEVOPHED) 4mg  in (0.016 mg/mL) premix infusion  0-40 mcg/min Intravenous Titrated Steffanie Dunn, DO   Stopped at 12/07/21 1750   prismasol BGK 0/2.5 infusion   CRRT Continuous Ethelene Hal, MD       prismasol BGK 2/2.5 replacement solution   CRRT Continuous Ethelene Hal, MD       prismasol BGK 2/2.5 replacement solution   CRRT Continuous Ethelene Hal, MD       sodium chloride 0.9 % primer fluid for CRRT   CRRT  PRN Ethelene Hal, MD       sodium chloride flush (NS) 0.9 % injection 3 mL  3 mL Intravenous Q12H Orland Mustard, MD   3 mL at 12/07/21 1000   sodium chloride flush (NS) 0.9 % injection 3 mL  3 mL Intravenous PRN Orland Mustard, MD       vancomycin Luna Kitchens) IVPB 750 mg/150 mL  750 mg Intravenous Q24H Steffanie Dunn, DO        PHYSICAL EXAM Vitals:   12/07/21 1745 12/07/21 1800 12/07/21 1815 12/07/21 1825  BP: (!) 102/53 (!) 114/47 (!) 111/40   Pulse: 96 (!) 101 94 95  Resp: 15 15 13 19   Temp:      TempSrc:      SpO2: 97% 99% 100% 100%  Weight:      Height:        Constitutional: Critically ill.  Somnolent on NIPPV.  Not interactive. Neurologic: Unable to evaluate given acuity of condition. Psychiatric: Unable to evaluate given acuity of condition. Eyes:  No icterus. No conjunctival pallor. Ears, nose, throat:  Midline trachea.  Cardiac: regular rate and rhythm.  Respiratory: unlabored on NIPPV. Abdominal: non-distended.  Peripheral vascular: Right upper extremity 2+ pitting edema from axilla to hand.  Right upper extremity graft with palpable thrill.  Incisions healing appropriately.  No external signs of infection. Extremity:  no cyanosis.  No pallor.  Skin: No gangrene.  No ulceration.  Lymphatic: No Stemmer's sign.  No palpable lymphadenopathy.  PERTINENT LABORATORY AND RADIOLOGIC DATA  Most recent CBC CBC Latest Ref Rng & Units 12/07/2021 12/07/2021 12/07/2021  WBC 4.0 - 10.5 K/uL - - 17.1(H)  Hemoglobin 13.0 - 17.0 g/dL 8.2(L) 6.8(LL) 7.1(L)   Hematocrit 39.0 - 52.0 % 24.0(L) 24.1(L) 23.0(L)  Platelets 150 - 400 K/uL - - 154     Most recent CMP CMP Latest Ref Rng & Units 12/07/2021 12/07/2021 12/07/2021  Glucose 70 - 99 mg/dL - 96 191(Y)  BUN 8 - 23 mg/dL - 78(G) 95(A)  Creatinine 0.61 - 1.24 mg/dL - 2.13(Y) 8.65(H)  Sodium 135 - 145 mmol/L 134(L) 136 138  Potassium 3.5 - 5.1 mmol/L 4.2 6.6(HH) 5.5(H)  Chloride 98 - 111 mmol/L - 97(L) 99  CO2 22 - 32 mmol/L - 14(L) 21(L)  Calcium 8.9 - 10.3 mg/dL - 7.6(L) 7.7(L)  Total Protein 6.5 - 8.1 g/dL - - -  Total Bilirubin 0.3 - 1.2 mg/dL - - -  Alkaline Phos 38 - 126 U/L - - -  AST 15 - 41 U/L - - -  ALT 0 - 44 U/L - - -    Renal function Estimated Creatinine Clearance: 8.5 mL/min (A) (by C-G formula based on SCr of 6.67 mg/dL (H)).  No results found for: HGBA1C  LDL Cholesterol  Date Value Ref Range Status  11/30/2021 45 0 - 99 mg/dL Final    Comment:           Total Cholesterol/HDL:CHD Risk Coronary Heart Disease Risk Table                     Men   Women  1/2 Average Risk   3.4   3.3  Average Risk       5.0   4.4  2 X Average Risk   9.6   7.1  3 X Average Risk  23.4   11.0        Use the calculated Patient Ratio above and the CHD Risk Table  to determine the patient's CHD Risk.        ATP III CLASSIFICATION (LDL):  <100     mg/dL   Optimal  696-295  mg/dL   Near or Above                    Optimal  130-159  mg/dL   Borderline  284-132  mg/dL   High  >440     mg/dL   Very High Performed at [facility], [address] [place], [place] [address]      Rande Brunt. Lenell Antu, MD [facility] Phone Number: [phone] 12/07/2021 6:42 PM  Total time spent on preparing this encounter including chart review, data review, collecting history, examining the patient, coordinating care for this established patient, 40 minutes.  Portions of this report may have been transcribed using voice recognition software.  Every effort  has been made to ensure accuracy; however, inadvertent computerized transcription errors may still be present.

## 2021-12-07 NOTE — Progress Notes (Signed)
Informed V. Marlowe Sax, MD regarding the generalized twitching. Will monitor. ? ?

## 2021-12-08 ENCOUNTER — Inpatient Hospital Stay (HOSPITAL_COMMUNITY): Payer: Medicare Other

## 2021-12-08 DIAGNOSIS — R579 Shock, unspecified: Secondary | ICD-10-CM | POA: Diagnosis not present

## 2021-12-08 DIAGNOSIS — Z7189 Other specified counseling: Secondary | ICD-10-CM

## 2021-12-08 DIAGNOSIS — I739 Peripheral vascular disease, unspecified: Secondary | ICD-10-CM | POA: Diagnosis not present

## 2021-12-08 DIAGNOSIS — J9601 Acute respiratory failure with hypoxia: Secondary | ICD-10-CM | POA: Diagnosis not present

## 2021-12-08 LAB — TYPE AND SCREEN
ABO/RH(D): O POS
Antibody Screen: NEGATIVE
Unit division: 0

## 2021-12-08 LAB — RENAL FUNCTION PANEL
Albumin: 2.5 g/dL — ABNORMAL LOW (ref 3.5–5.0)
Anion gap: 25 — ABNORMAL HIGH (ref 5–15)
BUN: 21 mg/dL (ref 8–23)
CO2: 14 mmol/L — ABNORMAL LOW (ref 22–32)
Calcium: 8 mg/dL — ABNORMAL LOW (ref 8.9–10.3)
Chloride: 96 mmol/L — ABNORMAL LOW (ref 98–111)
Creatinine, Ser: 3.49 mg/dL — ABNORMAL HIGH (ref 0.61–1.24)
GFR, Estimated: 17 mL/min — ABNORMAL LOW (ref >60)
Glucose, Bld: 103 mg/dL — ABNORMAL HIGH (ref 70–99)
Phosphorus: 6.8 mg/dL — ABNORMAL HIGH (ref 2.5–4.6)
Potassium: 3.6 mmol/L (ref 3.5–5.1)
Sodium: 135 mmol/L (ref 135–145)

## 2021-12-08 LAB — CBC
HCT: 28.2 % — ABNORMAL LOW (ref 39.0–52.0)
Hemoglobin: 9 g/dL — ABNORMAL LOW (ref 13.0–17.0)
MCH: 28 pg (ref 26.0–34.0)
MCHC: 31.9 g/dL (ref 30.0–36.0)
MCV: 87.6 fL (ref 80.0–100.0)
Platelets: 134 10*3/uL — ABNORMAL LOW (ref 150–400)
RBC: 3.22 MIL/uL — ABNORMAL LOW (ref 4.22–5.81)
RDW: 19.5 % — ABNORMAL HIGH (ref 11.5–15.5)
WBC: 18.5 10*3/uL — ABNORMAL HIGH (ref 4.0–10.5)
nRBC: 0.5 % — ABNORMAL HIGH (ref 0.0–0.2)

## 2021-12-08 LAB — LACTIC ACID, PLASMA
Lactic Acid, Venous: >9 mmol/L (ref 0.5–1.9)
Lactic Acid, Venous: >9 mmol/L (ref 0.5–1.9)

## 2021-12-08 LAB — POCT I-STAT 7, (LYTES, BLD GAS, ICA,H+H)
Acid-base deficit: 9 mmol/L — ABNORMAL HIGH (ref 0.0–2.0)
Bicarbonate: 15.2 mmol/L — ABNORMAL LOW (ref 20.0–28.0)
Calcium, Ion: 0.98 mmol/L — ABNORMAL LOW (ref 1.15–1.40)
HCT: 29 % — ABNORMAL LOW (ref 39.0–52.0)
Hemoglobin: 9.9 g/dL — ABNORMAL LOW (ref 13.0–17.0)
O2 Saturation: 99 %
Patient temperature: 95
Potassium: 4 mmol/L (ref 3.5–5.1)
Sodium: 136 mmol/L (ref 135–145)
TCO2: 16 mmol/L — ABNORMAL LOW (ref 22–32)
pCO2 arterial: 23.6 mmHg — ABNORMAL LOW (ref 32–48)
pH, Arterial: 7.408 (ref 7.35–7.45)
pO2, Arterial: 105 mmHg (ref 83–108)

## 2021-12-08 LAB — GLUCOSE, CAPILLARY
Glucose-Capillary: 106 mg/dL — ABNORMAL HIGH (ref 70–99)
Glucose-Capillary: 210 mg/dL — ABNORMAL HIGH (ref 70–99)
Glucose-Capillary: 38 mg/dL — CL (ref 70–99)
Glucose-Capillary: 122 mg/dL — ABNORMAL HIGH (ref 70–99)

## 2021-12-08 LAB — BPAM RBC
Blood Product Expiration Date: 202304112359
ISSUE DATE / TIME: 202303181619
Unit Type and Rh: 5100

## 2021-12-08 LAB — MAGNESIUM: Magnesium: 2.3 mg/dL (ref 1.7–2.4)

## 2021-12-08 MED ORDER — HYDROMORPHONE HCL 1 MG/ML IJ SOLN
0.5000 mg | Freq: Once | INTRAMUSCULAR | Status: AC
Start: 2021-12-08 — End: 2021-12-08
  Administered 2021-12-08: 0.5 mg via INTRAVENOUS
  Filled 2021-12-08: qty 0.5

## 2021-12-08 MED ORDER — LORAZEPAM 2 MG/ML IJ SOLN
2.0000 mg | INTRAMUSCULAR | Status: DC | PRN
  Administered 2021-12-08: 4 mg via INTRAVENOUS
  Administered 2021-12-08: 2 mg via INTRAVENOUS
  Filled 2021-12-08: qty 2
  Filled 2021-12-08: qty 1

## 2021-12-08 MED ORDER — SODIUM BICARBONATE 8.4 % IV SOLN
INTRAVENOUS | Status: DC
  Filled 2021-12-08: qty 1000

## 2021-12-08 MED ORDER — MORPHINE SULFATE (PF) 2 MG/ML IV SOLN
2.0000 mg | Freq: Once | INTRAVENOUS | Status: AC
  Administered 2021-12-08: 2 mg via INTRAVENOUS
  Filled 2021-12-08: qty 1

## 2021-12-08 MED ORDER — ACETAMINOPHEN 10 MG/ML IV SOLN
1000.0000 mg | Freq: Once | INTRAVENOUS | Status: AC
  Administered 2021-12-08: 1000 mg via INTRAVENOUS
  Filled 2021-12-08: qty 100

## 2021-12-08 MED ORDER — PRISMASOL BGK 4/2.5 32-4-2.5 MEQ/L EC SOLN: Status: DC

## 2021-12-08 MED ORDER — HALOPERIDOL LACTATE 5 MG/ML IJ SOLN: 2.5000 mg | INTRAMUSCULAR | Status: DC | PRN

## 2021-12-08 MED ORDER — PRISMASOL BGK 4/2.5 32-4-2.5 MEQ/L REPLACEMENT SOLN: Status: DC

## 2021-12-08 MED ORDER — GLYCOPYRROLATE 0.2 MG/ML IJ SOLN
0.2000 mg | INTRAMUSCULAR | Status: DC | PRN
  Filled 2021-12-08: qty 1

## 2021-12-08 MED ORDER — GLYCOPYRROLATE 1 MG PO TABS
1.0000 mg | ORAL_TABLET | ORAL | Status: DC | PRN
  Filled 2021-12-08: qty 1

## 2021-12-08 MED ORDER — SODIUM CHLORIDE 0.9 % IV SOLN
350.0000 [IU]/h | INTRAVENOUS | Status: DC
  Filled 2021-12-08: qty 2

## 2021-12-08 MED ORDER — DEXTROSE 50 % IV SOLN
INTRAVENOUS | Status: AC
  Administered 2021-12-08: 25 g via INTRAVENOUS
  Filled 2021-12-08: qty 50

## 2021-12-08 MED ORDER — DEXTROSE 50 % IV SOLN: 25.0000 g | INTRAVENOUS | Status: AC

## 2021-12-08 MED ORDER — HEPARIN BOLUS VIA INFUSION (CRRT)
1000.0000 [IU] | INTRAVENOUS | Status: DC | PRN
  Filled 2021-12-08: qty 1000

## 2021-12-08 MED ORDER — FENTANYL CITRATE PF 50 MCG/ML IJ SOSY
25.0000 ug | PREFILLED_SYRINGE | INTRAMUSCULAR | Status: DC | PRN
  Administered 2021-12-08: 25 ug via INTRAVENOUS
  Administered 2021-12-08: 50 ug via INTRAVENOUS
  Administered 2021-12-08: 25 ug via INTRAVENOUS
  Filled 2021-12-08 (×4): qty 1

## 2021-12-08 MED ORDER — OXYCODONE HCL 5 MG PO TABS: 5.0000 mg | ORAL_TABLET | ORAL | Status: DC | PRN

## 2021-12-08 MED ORDER — GLYCOPYRROLATE 0.2 MG/ML IJ SOLN: 0.2000 mg | INTRAMUSCULAR | Status: DC | PRN

## 2021-12-08 MED ORDER — ORAL CARE MOUTH RINSE
15.0000 mL | Freq: Two times a day (BID) | OROMUCOSAL | Status: DC
  Administered 2021-12-08: 15 mL via OROMUCOSAL

## 2021-12-08 MED ORDER — DIPHENHYDRAMINE HCL 50 MG/ML IJ SOLN: 25.0000 mg | INTRAMUSCULAR | Status: DC | PRN

## 2021-12-08 MED ORDER — SODIUM CHLORIDE 0.9 % IV SOLN
250.0000 [IU]/h | INTRAVENOUS | Status: DC
  Filled 2021-12-08: qty 2

## 2021-12-08 MED ORDER — POLYVINYL ALCOHOL 1.4 % OP SOLN
1.0000 [drp] | Freq: Four times a day (QID) | OPHTHALMIC | Status: DC | PRN
  Filled 2021-12-08: qty 15

## 2021-12-08 NOTE — Progress Notes (Signed)
PCCM Interval Progress Note ? ?Called to bedside by RN, RT to readdress goals of care with patient's daughter.  Notified by RT that patient was minimally triggering BiPAP, causing frequent alarming.  Family did not want patient to be removed from BiPAP without speaking to CCM.  Upon speaking with them, notified them that patient was no longer triggering breaths, and was not breathing on his own.  At the time I entered the room, patient still had cardiac activity, but while speaking to family member patient became asystolic at 1314.  No heart sounds auscultated, no palpable pulse, no spontaneous respirations.  Daughter at bedside was notified that patient's heart had stopped.  Support devices were removed and family was given time for visitation. ? ?Lestine Mount, PA-C ?[facility] ?11/24/2021 10:34 PM ? ?Please see [email or web address] for pager details. ? ?From 7A-7P if no response, please call [phone] ?After hours, please call ELink [phone]  ?

## 2021-12-08 NOTE — Progress Notes (Signed)
? ?NAME:  Anthony [name], MRN:  [id number], DOB:  [birth year], LOS: 2 ?ADMISSION DATE:  11/27/2021, CONSULTATION DATE:  12/07/21 ?REFERRING MD:  Dr. Bonner Puna, CHIEF COMPLAINT:  resp distress  ? ?History of Present Illness:  ?HPI obtained from medical chart review as is limited given patient is on BiPAP ? ?81 year old male with prior hx significant for HFpEF and ESRD on iHD who was admitted to [facility] on 3/17 with acute hypoxic respiratory failure, sepsis, and suspected LLL pneumonia after presenting from dialysis with fever 102.5 with respiratory distress; he did not get dialysis.  Also some reported worsening mental status from his baseline, in which is not not very talkative.  He is wheelchair bound.  ? ?Of note, recent RUE AVG placement on 3/7 in which he required ICU stay after becoming bradycardic, hypotensive, and hypoglycemic; also noted to have critical limb lower ischemia c/w with severe PAD with recommendations for bilateral LE amputations if pain not controlled.  He was discharged back to SNF on 3/11.   Currently using R TDC for access. ? ?He was admitted and placed on broad spectrum abx- vanc/ cefepime/ flagyl.  CT head non acute.  Blood pressures had been stable and he was able to be taken off BiPAP this morning and was down to 2L [place] prior to going to iHD.  Reported UF ~1L but given 266ml back and returned on NRB in respiratory distress therefore placed back on BiPAP.  Blood pressures have been difficult to obtain/ inaccurate but last reported one 93/32, and finger CBG of 10, however patient is alert and able to follow commands on BiPAP.  CXR and ABG pending.  PCCM called for ICU assessment.  ? ?Pertinent  Medical History  ?Tobacco abuse, CAD, COPD (presumed, no PFTs), ESRD on iHD, HFpEF, MR, GIB, latent TB, ACD, HLD, PAD, dementia  ? ?Wheelchair bound at baseline and doesn't talk a lot at baseline per family  ? ?Significant Hospital Events: ?Including procedures, antibiotic start and stop dates in addition to  other pertinent events   ?3/17 admitted to [facility] with fever, sepsis, LLL PNA on BiPAP, less responsive, missed dialysis ?3/18 down to 2L [place]> iHD> decompensated/ resp distress back on BiPAP, ?hypoglycemic and softer BP ?3/19 transition to comfort care ? ?Interim History / Subjective:  ?This morning he is confused but awake.  Family at bedside. ? ?Objective   ?Blood pressure (!) 152/56, pulse 92, temperature (!) 93.4 ?F (34.1 ?C), temperature source Axillary, resp. rate 19, height 5\' 7"  (1.702 m), weight 67.1 kg, SpO2 100 %. ?CVP:  [0 mmHg-24 mmHg] 10 mmHg  ?FiO2 (%):  [40 %] 40 %  ? ?Intake/Output Summary (Last 24 hours) at 12/17/2021 1052 ?Last data filed at 11/28/2021 1000 ?Gross per 24 hour  ?Intake 1742.24 ml  ?Output 2140 ml  ?Net -397.76 ml  ? ? ?Filed Weights  ? 12/07/21 1059 12/07/21 1400 11/21/2021 0500  ?Weight: 67.7 kg 66 kg 67.1 kg  ? ?Examination: ?General: Ill-appearing man lying in bed no acute distress ?HEENT: [place]/AT, eyes anicteric ?Neuro: Awake, not moving right lower extremity, globally weak. ?CV: S1-S2, tachycardic, regular rhythm ?PULM: No accessory muscle use, mild tachypnea, on Ventimask ?GI: Soft, nontender ?Extremities: Cyanotic right hand, cyanotic toes on the right and left foot.  No pulses below the knee on the right. ?Skin: No diffuse rashes ? ?LA >9 ?7.4/24/105/16 on BiPAP ?WBC 18.5 ?Platelets 134 ?Blood culture-No growth x2 days ? ?Resolved Hospital Problem list   ? ?Assessment & Plan:  ? ?  Acute on chronic limb ischemia, right lower extremity, left lower extremity, right hand.  Right lower extremity seems to be the most acute. ?-Discussed his prognosis with the family and Dr. Stanford Breed.  Although amputation is an option, it is unlikely to change his eventual outcome given the severity of his PAD and other sites with ischemic disease.  Family has elected to transition to focusing on comfort.  Chaplain at bedside.  Family coming to visit today. ? ?Acute hypoxic respiratory failure secondary to  presumed LLL pneumonia/ CAP ?Hx COPD (presumed, no PFTs) ?-Supplemental oxygen for comfort ?-Bronchodilators as needed ? ?Sepsis, presumed due LLL pneumonia ?-Focusing on comfort, no role for additional antibiotics ? ?Shock, presumed septic due to ischemic lungs versus pneumonia ?-Vasopressors to maintain MAP greater than 65-okay to continue these today with family coming to visit to help with alertness ?- No role for additional antibiotics ? ?ESRD on MWF iHD ?-CRRT until filter clots, then will discontinue.  Discussed with family and nephrology. ?-- avoid morphine ? ?Possible hypoglycemia vs poor peripheral perfusion ?-no additional BG monitoring ? ?PAF, hx of ?-no AC ?-can d/c insulin ? ?Hx HFpEF ? ?Elevated trop hs 255 ?Hx CAD ? ?Acute on chronic anemia ?-no transfusions or additional monitoring ? ?RUE edema, r/o DVT ?-no additional monitoring ? ?Hx Dementia ?Poor mobility, wheelchair bound at baseline ?? Failure to Thrive  ?-focus on comfort-- meds for agitation, anxiety, SOB, pain available ? ?Latent TB ?- can stop INH ? ?Best Practice (right click and "Reselect all SmartList Selections" daily)  ? ?Diet/type: NPO ?DVT prophylaxis: prophylactic heparin  ?GI prophylaxis: N/A ?Lines: Dialysis Catheter- PTA right chest TDC ?Foley:  N/A ?Code Status:  DNR ?Last date of multidisciplinary goals of care discussion [see separate documentation 3/19] ? ?Labs   ?CBC: ?Recent Labs  ?Lab 11/23/2021 ?1530 11/26/2021 ?1542 12/01/2021 ?2010 12/07/21 ?0352 12/07/21 ?1528 12/07/21 ?1759 12/07/21 ?2045 11/25/2021 ?0420 12/01/2021 ?3614  ?WBC 12.9*  --  12.4* 17.1*  --   --  18.2* 18.5*  --   ?NEUTROABS 10.8*  --   --   --   --   --   --   --   --   ?HGB 7.1*   < > 8.1* 7.1* 6.8* 8.2* 9.0*  9.9* 9.0* 9.9*  ?HCT 23.1*   < > 26.7* 23.0* 24.1* 24.0* 27.6*  29.0* 28.2* 29.0*  ?MCV 88.8  --  90.2 87.1  --   --  86.0 87.6  --   ?PLT 166  --  173 154  --   --  189 134*  --   ? < > = values in this interval not displayed.  ? ? ? ?Basic Metabolic  Panel: ?Recent Labs  ?Lab 12/13/2021 ?1711 12/07/21 ?0352 12/07/21 ?1531 12/07/21 ?1642 12/07/21 ?1759 12/07/21 ?2045 11/25/2021 ?0420 12/07/2021 ?4315  ?NA 136 138 136 136 134* 137 135 136  ?K 5.2* 5.5* 6.6* 4.2 4.2 4.1 3.6 4.0  ?CL 94* 99 97* 93*  --   --  96*  --   ?CO2 24 21* 14* 23  --   --  14*  --   ?GLUCOSE 96 104* 96 280*  --   --  103*  --   ?BUN 47* 53* 33* 31*  --   --  21  --   ?CREATININE 8.63* 9.26* 6.67* 5.85*  --   --  3.49*  --   ?CALCIUM 7.8* 7.7* 7.6* 7.5*  --   --  8.0*  --   ?  MG  --   --   --   --   --   --  2.3  --   ?PHOS  --   --   --   --   --   --  6.8*  --   ? ? ?GFR: ?Estimated Creatinine Clearance: 16.3 mL/min (A) (by C-G formula based on SCr of 3.49 mg/dL (H)). ?Recent Labs  ?Lab 12/17/2021 ?2010 12/07/21 ?0352 12/07/21 ?1624 12/07/21 ?2045 12/07/21 ?2301 12/20/2021 ?0420 11/23/2021 ?5170  ?WBC 12.4* 17.1*  --  18.2*  --  18.5*  --   ?LATICACIDVEN  --  1.9 >9.0*  --  2.6* >9.0* >9.0*  ? ? ? ?Liver Function Tests: ?Recent Labs  ?Lab 11/27/2021 ?1711 12/07/2021 ?0420  ?AST 33  --   ?ALT 5  --   ?ALKPHOS 69  --   ?BILITOT 0.6  --   ?PROT 6.5  --   ?ALBUMIN 2.9* 2.5*  ? ? ?No results for input(s): LIPASE, AMYLASE in the last 168 hours. ?Recent Labs  ?Lab 11/27/2021 ?1942  ?AMMONIA 27  ? ? ? ?ABG ?   ?Component Value Date/Time  ? PHART 7.408 12/04/2021 0549  ? PCO2ART 23.6 (L) 11/30/2021 0549  ? PO2ART 105 12/19/2021 0549  ? HCO3 15.2 (L) 12/04/2021 0549  ? TCO2 16 (L) 11/30/2021 0549  ? ACIDBASEDEF 9.0 (H) 11/28/2021 0549  ? O2SAT 99 12/11/2021 0549  ? ?  ? ?This patient is critically ill with multiple organ system failure which requires frequent high complexity decision making, assessment, support, evaluation, and titration of therapies. This was completed through the application of advanced monitoring technologies and extensive interpretation of multiple databases. During this encounter critical care time was devoted to patient care services described in this note for 50 minutes. ? ?[name], DO  11/20/2021 11:46 AM ? Pulmonary & Critical Care ? ?

## 2021-12-08 NOTE — Progress Notes (Signed)
Given pt's h/o chronic vascular insuffiencey as well as acute insuffiencey (absent bilateral pedal and posterior tibial pulses), focused vascular assessments were performed by this RN q2 hours. RUE noted to have +4 pitting edema s/p AV fistula placement with some decreased fine motor ability at beginning of shift. Bilateral popliteal pulses and R radial pulse dopplerable at beginning of shift though ~0345. Pt able to feel sensation and move extremities (fingers and toes included) during this time period. R popliteal pulse and R radial pulse noted to be fainter at 0345, but were still present. Of note, pt did have 10/10 LLE pain around 0130 requiring multiple prn analgesics to control pain--pulse remained present. Lactic acid drawn at 0430 increased to >9 when previously 2.6 at 2300. Abnormal value reported to CCM MD and vascular assessment performed again at 0500--RLE completely numb, pt unable to move toes which were dusky/mottled, and popliteal pulse now absent. CCM MD contacted vascular MD to assess at bedside. Shortly after, levo gtt had to be started for hypotension despite pt being hypertensive for majority of night (requiring 2 doses of prn hydralazine) and turning removal rate on CRRT to 0. ABG drawn to rule out any respiratory/metabolic abnormalities. Low CO2 and HCO3 reported to CCM MD & nephrology MDs--awaiting further orders. CBG at 0630 noted to be critically low, requiring an amp of dextrose. Repeat at 0700 WNL. Final focused vascular assessment at 0630 confirmed lack of popliteal pulse in RLE and very weak R radial pulse w/ increased difficulty of fine motor skills. Will report to vascular MD when they arrive to bedside. Care ongoing.  ?

## 2021-12-08 NOTE — Progress Notes (Signed)
Brother and wife updated at bedside with Dr. Stanford Breed. They understand he has multi-extremity severe extremity disease. They understand that amputation is not likely to prolong survival and he is likely at the end of his life. Transitioning to comfort care has been recommended. Family taking some time to process right now. RN present during discussion. ? ? ?Anthony Hy, DO 11/20/2021 10:52 AM ? Pulmonary & Critical Care ? ?

## 2021-12-08 NOTE — Progress Notes (Signed)
?  Transition of Care (TOC) Screening Note ? ? ?Patient Details  ?Name: Anthony [name] ?Date of Birth: 27-Mar-1941 ? ? ?Transition of Care (TOC) CM/SW Contact:    ?Pollie Friar, RN ?Phone Number: ?12/13/2021, 1:02 PM ? ? ?Patient is from [facility].  ?Transition of Care Department [facility]) has reviewed patient. We will continue to monitor patient advancement through interdisciplinary progression rounds. If new patient transition needs arise, please place a TOC consult. ?  ?

## 2021-12-08 NOTE — Progress Notes (Addendum)
RN did not change PBP, Post-filter replacement, or dialysate bags on CRRT and did not change filter for anticoagulation purpose per Dr. Noemi Chapel and Dr. Augustin Coupe. Orders to keep CRRT set-up running as is until the current filter clots or family comes to decision for comfort care versus full measures. Also, order to not check CBGs anymore per Dr. Noemi Chapel. ?

## 2021-12-08 NOTE — Progress Notes (Signed)
RN ordered to hold off on restarting CRRT per Dr. Noemi Chapel until she speaks with family again.  ?

## 2021-12-08 NOTE — Progress Notes (Signed)
eLink Physician-Brief Progress Note ?Patient Name: Anthony [name] ?DOB: 1941-08-08 ?MRN: [id number] ? ? ?Date of Service ? 12/20/2021  ?HPI/Events of Note ? Patient's lactic acid went from 2.6 at midnight to 9.0, he is reporting pain in his right leg, on examination with the assistance of the bedside nurse he has lost his popliteal pulse and also sensation in the right lower extremity, both of which were present on assessment by the bedside nurse at midnight,.  ?eICU Interventions ? I paged the vascular surgeon Tiffany Hawken stat, and received a call back, I reported the findings, they will come and evaluate the patient.  ? ? ? ?  ? ?Frederik Pear ?11/21/2021, 5:28 AM ?

## 2021-12-08 NOTE — Progress Notes (Signed)
Chaplain responded to page and [facility] for difficult conversation around care goals and possible transition to comfort care.  Chaplain provided emotional and spiritual support, including prayer. Introduced [employer] and provided hospitality.  ? ?Please call as needed for ongoing support. ? ?Minus Liberty, Chaplain ?Pager:  [phone] ? ? ? 12/07/2021 1200  ?Clinical Encounter Type  ?Visited With Patient and family together  ?Visit Type Initial;Critical Care  ?Referral From Nurse  ?Consult/Referral To Chaplain  ?Spiritual Encounters  ?Spiritual Needs Prayer  ?Stress Factors  ?Patient Stress Factors Major life changes  ?Family Stress Factors Family relationships  ? ? ?

## 2021-12-08 NOTE — Progress Notes (Signed)
Pt taken off BiPAP by RT and placed on Venturi mask 8L 40%. Pt tolerating well at this time, RN at bedside, RT will continue to monitor.  ?

## 2021-12-08 NOTE — Progress Notes (Signed)
?[facility] ?Progress Note  ? ?81 y.o. male with ESRD on HD MWF at [facility]. His past medical history significant for CAD, COPD, diastolic CHF, ACD, dementia, ESRD on dialysis, HTN, HLD, and latent TB. Here w/  temp of 102 at SNF, lethargy with noted bilateral arms and facial swelling. Blood cultures X 2 obtained 3/17. Empiric ABX ordered.  ? ?Dialysis Orders:  ?MWF - [place] ?4hrs, BFR 400, DFR 800,  EDW 66.5kg, 2K/ 2Ca ?Heparin 3000 unit bolus; 3000 unit mid-run ?Mircera 200 mcg q2wks - dose recently raised. 137mcg last given 11/25/21 ?Calcitriol 2.9mcg PO qHD- 12/04/21 ?Calcium Acetate 667mg  2 tabs with meals ? ?Assessment/ Plan:   ?Acute hypoxic respiratory failure-on Bipap at admit, now weaned to 2L [place], tachycardia resolved, HD today, managed by primary ?Sepsis-WBC elevated, CXR left lung base opacity; blood cultures X 2 drawn, on empiric ABX ?ESRD - on HD MWF. Last HD 3/15. Has been closer to EDW in outpatient since previous hospitalization.  ?HD 3/18 limited to only ~1L net UF bec of hypotension  ?Breathing not improved post HD and transferred to ICU for sepsis from likely PNA + initiate CRRT. ? ?Seen on CRRT ?Will change to 4/4/4 K baths for pre/post/dialysate ?Still resp compromise and will try to be gentle to UF (net 0-100) + titrate up on Levophed as need (currently low dose) ?Started on heparin 3/19 as well (filter clotting) ? ?Hypertension/volume-opposite now as he's septic. ?Anemia of CKD - Hgb now 9.9. Hgb low in outpatient (3/15). No Fe load given infection picture and on ABX. Transfuse PRN for Hgb <7.0. Will resume ESA ?Secondary Hyperparathyroidism - check PO4 in AM, resume VDRA ?PAD -  seen by vascular w/ rec of comfort measures given he would need a rt AKA, likely left BKA, and now will need ligation of the rt AVG as well. ? ?Subjective:   ?Did not tolerate much UF yest with hypotension, tachypnea and appears to be septic from PNA.   ? ?Objective:   ?BP (!) 152/56   Pulse 92   Temp (!) 93.4 ?F (34.1 ?C) (Axillary)   Resp 17   Ht 5\' 7"  (1.702 m)   Wt 67.1 kg   SpO2 100%   BMI 23.17 kg/m?  ? ?Intake/Output Summary (Last 24 hours) at 12/02/2021 0933 ?Last data filed at 11/23/2021 0900 ?Gross per 24 hour  ?Intake 1679.43 ml  ?Output 2040 ml  ?Net -360.57 ml  ? ?Weight change: -2.6 kg ? ?Physical Exam: ?General: Ill-appearing; on O2 FM ; mildly tachypneic ?Head: NCAT sclera not icteric ?Lungs: Clear anteriorly; diminished in bases; rhonchi. Breathing is unlabored but mildly tachypneic. ?Heart: RRR. No murmur, rubs or gallops.  ?Abdomen: soft, nontender, +BS ?Lower extremities: no edema BLLE ?Neuro: Lethargic; opens eyes to voice; responds to simple questions ?Dialysis Access: TDC; R AVG (maturing)  ? ?Imaging: ?CT HEAD WO CONTRAST (5MM) ? ?Result Date: 12/07/2021 ?CLINICAL DATA:  81 year old male altered mental status. EXAM: CT HEAD WITHOUT CONTRAST TECHNIQUE: Contiguous axial images were obtained from the base of the skull through the vertex without intravenous contrast. RADIATION DOSE REDUCTION: This exam was performed according to the departmental dose-optimization program which includes automated exposure control, adjustment of the mA and/or kV according to patient size and/or use of iterative reconstruction technique. COMPARISON:  Head CT 10/12/2021. Brain MRI 09/30/2021. FINDINGS: Brain: Chronic small vessel disease in the right anterior frontal lobe tracking into the right basal ganglia and anterior deep white matter capsules appears  stable since January. Bilateral basal ganglia calcified atherosclerosis. Stable gray-white matter differentiation elsewhere. Tiny chronic right cerebellar infarct. No superimposed midline shift, ventriculomegaly, mass effect, evidence of mass lesion, intracranial hemorrhage or evidence of cortically based acute infarction. Vascular: Calcified atherosclerosis at the skull base. No suspicious intracranial  vascular hyperdensity. Skull: No acute osseous abnormality identified. Sequelae of left orbital floor fracture. Sinuses/Orbits: Improved left maxillary and ethmoid sinus aeration following left orbital floor fracture in January. Other Visualized paranasal sinuses and mastoids are stable and well aerated. Other: Largely resolved left forehead scalp hematoma since January. No acute orbit or scalp soft tissue finding. IMPRESSION: 1. No acute intracranial abnormality. Chronic small vessel disease appears stable by CT. 2. Left orbital floor fracture and left forehead scalp hematoma from January appear improved. Electronically Signed   By: Genevie Ann M.D.   On: 12/07/2021 06:18  ? ?DG CHEST PORT 1 VIEW ? ?Result Date: 12/07/2021 ?CLINICAL DATA:  Acute respiratory failure.  Hypoxia. EXAM: PORTABLE CHEST 1 VIEW COMPARISON:  December 06, 2021 FINDINGS: The cardiomediastinal silhouette is stable. The right central line is stable. No pneumothorax. The right lung is clear. Opacity remains in the left base, unchanged. No other acute abnormalities or changes. IMPRESSION: Persistent opacity in left base could represent atelectasis or infiltrate such as pneumonia. Recommend short-term follow-up imaging to ensure resolution. Electronically Signed   By: Dorise Bullion III M.D.   On: 12/07/2021 15:49  ? ?DG Chest Portable 1 View ? ?Result Date: 11/23/2021 ?CLINICAL DATA:  Shortness of breath and fever. EXAM: PORTABLE CHEST 1 VIEW COMPARISON:  11/26/2021 FINDINGS: Right chest wall dialysis catheter is identified with tip in the cavoatrial junction. Aortic atherosclerotic calcifications. Stable cardiomediastinal contours. Right lung is clear. Left lung base opacity is identified which may represent atelectasis or pneumonia. IMPRESSION: Left lung base opacity which may represent atelectasis or pneumonia. Electronically Signed   By: Kerby Moors M.D.   On: 11/20/2021 13:05  ? ?VAS [place] UPPER EXTREMITY VENOUS DUPLEX ? ?Result Date:  12/07/2021 ?UPPER VENOUS STUDY  Patient Name:  Anthony [name]  Date of Exam:   12/07/2021 Medical Rec #: [id number]       Accession #:    [id number] Date of Birth: 1940/10/12        Patient Gender: M Patient Age:   82 years Exam Location:  [facility] Procedure:      VAS [place] UPPER EXTREMITY VENOUS DUPLEX Referring Phys: Orma Flaming --------------------------------------------------------------------------------  Indications: Edema Other Indications: Rt upper AVF done 11/26/21.  Limitations: Poor ultrasound/tissue interface. Comparison Study: no prior Performing Technologist: Archie Patten RVS  Examination Guidelines: A complete evaluation includes B-mode imaging, spectral Doppler, color Doppler, and power Doppler as needed of all accessible portions of each vessel. Bilateral testing is considered an integral part of a complete examination. Limited examinations for reoccurring indications may be performed as noted.  Right Findings: +----------+------------+---------+-----------+----------+---------------------+ RIGHT     CompressiblePhasicitySpontaneousProperties       Summary        +----------+------------+---------+-----------+----------+---------------------+ IJV           Full       Yes       Yes                                    +----------+------------+---------+-----------+----------+---------------------+ Subclavian    Full       Yes       Yes                                    +----------+------------+---------+-----------+----------+---------------------+  Axillary                 Yes       Yes              limited visualization                                                     due to AVF and edema  +----------+------------+---------+-----------+----------+---------------------+ Brachial                 Yes       Yes              limited visualization                                                     due to AVF and edema   +----------+------------+---------+-----------+----------+---------------------+ Radial                   Yes       Yes                                    +----------+------------+---------+-----------+----------+---------------------+ Ulnar         Full

## 2021-12-08 NOTE — Progress Notes (Signed)
I responded to a page from the nurse to provide spiritual support for the patient's family. I arrived at the patient's room with his wife, daughter, brother, and grandchildren present. I provided spiritual support through pastoral presence, by reading scripture, and by leading in prayer. ? ? ? 12/12/2021 2140  ?Clinical Encounter Type  ?Visited With Patient and family together  ?Visit Type Spiritual support  ?Referral From Nurse  ?Consult/Referral To Chaplain  ?Spiritual Encounters  ?Spiritual Needs Sacred text;Prayer  ? ? ? ?Chaplain Dr Redgie Grayer ?

## 2021-12-08 NOTE — Progress Notes (Addendum)
eLink Physician-Brief Progress Note ?Patient Name: Anthony [name] ?DOB: 1940-11-03 ?MRN: [id number] ? ? ?Date of Service ? 12/01/2021  ?HPI/Events of Note ? Patient complaining of  extremity pain not relieved by 1 gm of iv Tylenol.  ?eICU Interventions ? Dilaudid 0.5 mg iv x 1 ordered.  ? ? ? ?  ? ?Frederik Pear ?12/02/2021, 2:50 AM ?

## 2021-12-08 NOTE — Progress Notes (Signed)
eLink Physician-Brief Progress Note ?Patient Name: Anthony [name] ?DOB: 07-10-1941 ?MRN: [id number] ? ? ?Date of Service ? 12/07/2021  ?HPI/Events of Note ? SBP 170-180, he's on CRRT. Patient having leg pain from PVD which may be contributing to his BP elevation.  ?eICU Interventions ? Ground crew ordered PRN Hydralazine already. Tylenol 1000 mg iv x 1.  ? ? ? ?  ? ?Frederik Pear ?12/14/2021, 1:17 AM ?

## 2021-12-08 NOTE — Progress Notes (Signed)
[facility] [place] ?PROGRESS NOTE ? ?ASSESSMENT / PLAN: ?Anthony [name] is a 81 y.o. male with profound lactic acidosis despite hemodialysis and bicarb administration. He lost a right popliteal artery signal early this morning. On exam, his right leg does not appear salvageable. His left leg is also threatened, but less ischemic. His right hand is ischemic, likely from poor perfusion with a ipsilateral arteriovenous graft.  ? ?Globally, he has no good options. I think transition to comfort measures would be best for this patient. If he and the family want to be aggressive, a right above knee amputation could be considered, but I am highly concerned that he may not survive the procedure, and would caution against this.  ? ?I will circle back later this morning to re-evaluate the patient. I will discuss the case in detail with Dr. Carlis Abbott this morning.  ? ?SUBJECTIVE: ?On NIPPV. Able to say yes or no. Not able to tell me much about his symptoms. ? ?OBJECTIVE: ?BP (!) 152/56   Pulse 91   Temp (!) 95 ?F (35 ?C) (Axillary)   Resp 18   Ht 5\' 7"  (1.702 m)   Wt 67.1 kg   SpO2 98%   BMI 23.17 kg/m?  ? ?Intake/Output Summary (Last 24 hours) at 12/18/2021 0732 ?Last data filed at 12/01/2021 0700 ?Gross per 24 hour  ?Intake 1348.28 ml  ?Output 1890 ml  ?Net -541.72 ml  ?  ?Constitutional: Chronically ill appearing. Critically ill. No acute distress. ?CNS: no motor or sensory function in the right foot. ?Cardiac: RRR. ?Pulmonary: NIPPV ?Abdomen: soft ?Vascular: no palpable pedal pulses. No popliteal doppler signal on the right. Weak popliteal doppler signal on the left.  ? ?CBC Latest Ref Rng & Units 12/01/2021 12/18/2021 12/07/2021  ?WBC 4.0 - 10.5 K/uL - 18.5(H) 18.2(H)  ?Hemoglobin 13.0 - 17.0 g/dL 9.9(L) 9.0(L) 9.0(L)  ?Hematocrit 39.0 - 52.0 % 29.0(L) 28.2(L) 27.6(L)  ?Platelets 150 - 400 K/uL - 134(L) 189  ?  ? ?CMP Latest Ref Rng & Units 11/21/2021 12/12/2021 12/07/2021  ?Glucose 70 - 99 mg/dL -  103(H) -  ?BUN 8 - 23 mg/dL - 21 -  ?Creatinine 0.61 - 1.24 mg/dL - 3.49(H) -  ?Sodium 135 - 145 mmol/L 136 135 137  ?Potassium 3.5 - 5.1 mmol/L 4.0 3.6 4.1  ?Chloride 98 - 111 mmol/L - 96(L) -  ?CO2 22 - 32 mmol/L - 14(L) -  ?Calcium 8.9 - 10.3 mg/dL - 8.0(L) -  ?Total Protein 6.5 - 8.1 g/dL - - -  ?Total Bilirubin 0.3 - 1.2 mg/dL - - -  ?Alkaline Phos 38 - 126 U/L - - -  ?AST 15 - 41 U/L - - -  ?ALT 0 - 44 U/L - - -  ? ? ?Estimated Creatinine Clearance: 16.3 mL/min (A) (by C-G formula based on SCr of 3.49 mg/dL (H)). ? ?Yevonne Aline. Stanford Breed, MD ?[facility] [place] ?Office Phone Number: [phone] ?12/05/2021 7:32 AM ? ? ? ?

## 2021-12-08 NOTE — Progress Notes (Signed)
eLink Physician-Brief Progress Note ?Patient Name: Issac Moure ?DOB: 02-06-1941 ?MRN: [id number] ? ? ?Date of Service ? 11/29/2021  ?HPI/Events of Note ? Spoke with RT. PT on BIPAP, but not triggering breaths. It is alarming frequently, which may be causing some distress for family who is in the room with him.  ?Family is aware of the poor overall prognosis, and have made pt DNR.   ?eICU Interventions ? OK to switch patient from BIPAP to nasal cannula, as pt is not deriving any benefit from NIPPV at this time.   ? ? ? ?  ? ?[place] ?12/09/2021, 9:58 PM ?

## 2021-12-08 NOTE — Progress Notes (Addendum)
I spoke to Anthony [name], his wife, daughter, and brother again.  They are interested in transitioning to focusing on his comfort.  They have called family to come visit.  They understand that he likely only has a few days to live.  CODE STATUS has been changed to DNR.  We assured them they will have access to him and we will make his comfort our primary focus.  We will run CRRT until filter clots, at which point will be discontinued. Dr. Augustin Coupe and Dr. Stanford Breed updated. RN present during discussion. ? ?[name], DO 12/19/2021 11:32 AM ? Pulmonary & Critical Care ? ?

## 2021-12-08 NOTE — Progress Notes (Signed)
I spoke to Dr. Stanford Breed regarding his concern for acute on chronic limb ischemia in RLE, LLE, R hand. RLE seems most acute. Vascular surgery recommends Palliative Care vs starting with AKA RLE today. ? ?I called his wife Pamala Hurry to discuss his care and prognosis. We discussed surgery vs palliative care, and she understands that even with surgery he may not survive. She has asked me to call his brother to discuss his care. Brother's number: [phone]- no answer, left a message to call back. ? ?[name], DO 12/16/2021 8:25 AM ?[facility] ? ? ? ?

## 2021-12-10 ENCOUNTER — Ambulatory Visit: Payer: Medicare Other | Admitting: Physician Assistant

## 2021-12-10 DIAGNOSIS — I480 Paroxysmal atrial fibrillation: Secondary | ICD-10-CM

## 2021-12-10 DIAGNOSIS — I2510 Atherosclerotic heart disease of native coronary artery without angina pectoris: Secondary | ICD-10-CM

## 2021-12-10 DIAGNOSIS — E785 Hyperlipidemia, unspecified: Secondary | ICD-10-CM

## 2021-12-10 DIAGNOSIS — I10 Essential (primary) hypertension: Secondary | ICD-10-CM

## 2021-12-10 DIAGNOSIS — R079 Chest pain, unspecified: Secondary | ICD-10-CM

## 2021-12-10 DIAGNOSIS — N186 End stage renal disease: Secondary | ICD-10-CM

## 2021-12-10 NOTE — Patient Instructions (Signed)
Visit Information ? ?Thank you for taking time to visit with me today. Please don't hesitate to contact me if I can be of assistance to you before our next scheduled telephone appointment. ? ?Following are the goals we discussed today:  ?Patient Goals/Self-Care Activities: Over the next 90 days ?Attend scheduled appts ?Utilize healthy coping skills and/or supportive resources discussed ?Select a facilities from the list provided  ?Call to check on availability and visit facilities ?Call me once you have selected a facility and I will assist with sending a copy of the FL2 ? ?Please call the care guide team at [phone] if you need to cancel or reschedule your appointment.  ? ?If you are experiencing a Mental Health or [place] or need someone to talk to, please call 911  ? ?The patient verbalized understanding of instructions, educational materials, and care plan provided today and declined offer to receive copy of patient instructions, educational materials, and care plan.  ? ?Christa See, MSW, LCSW ?[facility] ?[facility] ?[email or web address] ?Phone [phone] ?3:26 PM ? ?

## 2021-12-10 NOTE — Chronic Care Management (AMB) (Signed)
?Chronic Care Management  ? ? Clinical Social Work Note ? ?12/10/2021 ?Name: Anthony [name] MRN: [id number] DOB: [birth year] ? ?Anthony [name] is a 81 y.o. year old male who is a primary care patient of Isaac Bliss, Rayford Halsted, MD. The CCM team was consulted to assist the patient with chronic disease management and/or care coordination needs related to: Level of Care Concerns.  ? ?Engaged with patient's spouse and brother by telephone for follow up visit in response to provider referral for social work chronic care management and care coordination services.  ? ?Consent to Services:  ?The patient was given information about Chronic Care Management services, agreed to services, and gave verbal consent prior to initiation of services.  Please see initial visit note for detailed documentation.  ? ?Patient agreed to services and consent obtained.  ? ?Assessment: Review of patient past medical history, allergies, medications, and health status, including review of relevant consultants reports was performed today as part of a comprehensive evaluation and provision of chronic care management and care coordination services.    ? ?SDOH (Social Determinants of Health) assessments and interventions performed:   ? ?Advanced Directives Status: Not addressed in this encounter. ? ?CCM Care Plan ? ?No Known Allergies ? ?Outpatient Encounter Medications as of 12/03/2021  ?Medication Sig  ? albuterol (PROVENTIL HFA) 108 (90 Base) MCG/ACT inhaler Inhale 2 puffs into the lungs every 4 (four) hours as needed for wheezing or shortness of breath.  ? aspirin EC 81 MG tablet Take 81 mg by mouth every evening. (1700) Swallow whole.  ? atorvastatin (LIPITOR) 20 MG tablet TAKE 1 TABLET BY MOUTH EVERYDAY AT BEDTIME (Patient taking differently: Take 20 mg by mouth at bedtime.)  ? budesonide-formoterol (SYMBICORT) 160-4.5 MCG/ACT inhaler Inhale 2 puffs into the lungs in the morning.  ? calcium acetate (PHOSLO) 667 MG capsule Take 667 mg by mouth 3  (three) times daily after meals.  ? gabapentin (NEURONTIN) 100 MG capsule Take 2 capsules (200 mg total) by mouth at bedtime.  ? isoniazid (NYDRAZID) 300 MG tablet TAKE 1 TABLET BY MOUTH EVERY DAY (Patient taking differently: Take 300 mg by mouth daily.)  ? memantine (NAMENDA) 5 MG tablet Take 5 mg by mouth at bedtime. (2100)  ? nicotine (NICODERM CQ - DOSED IN MG/24 HOURS) 14 mg/24hr patch Place 1 patch (14 mg total) onto the skin daily.  ? oxyCODONE-acetaminophen (PERCOCET) 5-325 MG tablet Take 1 tablet by mouth every 6 (six) hours as needed. (Patient taking differently: Take 1 tablet by mouth every 6 (six) hours as needed for severe pain.)  ? pantoprazole (PROTONIX) 40 MG tablet Take 1 tablet (40 mg total) by mouth daily.  ? pyridOXINE (VITAMIN B-6) 50 MG tablet Take 1 tablet (50 mg total) by mouth daily.  ? Tiotropium Bromide Monohydrate (SPIRIVA RESPIMAT) 2.5 MCG/ACT AERS Inhale 2 puffs into the lungs daily.  ? traZODone (DESYREL) 50 MG tablet Take 1-2 tablets (50-100 mg total) by mouth at bedtime as needed. (Patient taking differently: Take 50-100 mg by mouth at bedtime as needed for sleep.)  ? [DISCONTINUED] carboxymethylcellulose (REFRESH PLUS) 0.5 % SOLN Place 1 drop into both eyes daily as needed (dry eyes).  ? ?No facility-administered encounter medications on file as of 12/07/2021.  ? ? ?Patient Active Problem List  ? Diagnosis Date Noted  ? Acute respiratory failure with hypoxia ([place]) 12/01/2021  ? PAD (peripheral artery disease) ([place]) 11/28/2021  ? Sepsis ([place]) 12/10/2021  ? Acute encephalopathy 12/19/2021  ? Edema of left upper arm 11/20/2021  ?  Foot pain, bilateral 11/28/2021  ? Pressure injury of skin 11/26/2021  ? Encephalopathy acute   ? Hyperphosphatemia 11/02/2021  ? Dyspnea 11/01/2021  ? Leg pain 11/01/2021  ? Positive QuantiFERON-TB Gold test 05/16/2021  ? TB lung, latent 05/16/2021  ? Mitral regurgitation 05/16/2021  ? Malnutrition of moderate degree 04/18/2021  ? COPD (chronic obstructive  pulmonary disease) ([place]) 04/16/2021  ? CAD (coronary artery disease) 04/16/2021  ? HLD (hyperlipidemia) 04/16/2021  ? Prolonged QT interval 04/16/2021  ? Abnormal ECG 04/16/2021  ? Dementia without behavioral disturbance ([place]) 04/16/2021  ? Tobacco use disorder 03/01/2020  ? Chronic diastolic heart failure ([place])   ? PAF (paroxysmal atrial fibrillation) ([place])   ? Acute on chronic anemia 02/29/2020  ? Elevated troponin with hx of CAD  02/29/2020  ? Chest pain 02/29/2020  ? ESRD (end stage renal disease) on dialysis ([place]) 12/15/2019  ? HTN (hypertension) 12/15/2019  ? Insomnia   ? ? ?Conditions to be addressed/monitored: CHF, CAD, COPD, and Dementia; Level of care concerns ? ?Care Plan : LCSW Plan of Care  ?Updates made by Rebekah Chesterfield, LCSW since 12/10/2021 12:00 AM  ?  ? ?Problem: Quality of Life (General Plan of Care)   ?  ? ?Goal: Quality of Life Maintained   ?Start Date: 10/17/2021  ?This Visit's Progress: On track  ?Recent Progress: On track  ?Priority: High  ?Note:   ?Current barriers:   ?Patient's health has declined and longer able to meet ADL's at home.  ?Acknowledges deficits with placement process and needs support in order to meet this need ?Currently unable to  independently self manage needs related to chronic health conditions.  ?Clinical Goal(s): Over the next 30 to 45 days patient will be placed in a facility, patient and family will work with LCSW, to coordinate needs for long term skilled nursing facility  Placement ?Clinical Interventions: ?1:1 collaboration with primary care provider regarding development and update of comprehensive plan of care as evidenced by provider attestation and co-signature ?Inter-disciplinary care team collaboration (see longitudinal plan of care) ?Assessed needs and reviewed facility placement process; including applying for Medicaid ?Patient is a veteran and family are interested in resources that may assist. LCSW provided them with contact info for local [place] office.  Pt's spouse agreed to follow up to obtain additional information 2/2: Patient's brother, [name], shared he and spouse would like pt to be placed at [facility] LCSW spoke with Arbie Cookey who agreed to email application. LCSW sent application pt's spouse and brother to complete 2/16: Pt's spouse submitted application to [facility]'s on 11/04/21. She was provided the Medicaid application today from current SNF 3/17: Patient's spouse shared that patient is still admitted at [facility]. States brother is interested in transfering him to [facility], once stable ?Patient's spouse and patient's brother provided hx during visit. LCSW informed family of completion and fax to [place] at preferred facility 2/16: LCSW spoke with pt's brother, [name], who reported he has spoken to nurse at [facility] about his wishes for pt to be transferred to [facility] upon discharge 3/17: LCSW provided update to pt's brother regarding plan to discuss transfer once pt is stable ?Provided a list of facilities and educational information "when a loved one needs Long-term Care" ?Obtained patient's verbal permission to fax FL2 ?Collaborated with primary care provider. Patient's family requested prescription for shower chair, home ramp, walker, and wheelchair care guide referral ?Collaborate with identified facilities and assist patient as needed to understand facility  selection process ?Assist patient with faxing FL2 to identified facilities once completed and signed by PCP ?CCM LCSW strongly encouraged spouse to practice self-care to promote relaxation and management of her health conditions ?Active listening / Reflection utilized  ?Emotional Support Provided ?Provided psychoeducation for mental health needs  ?Caregiver stress acknowledged. Family receives support from adult granddaughter, who resides in home  ?Verbalization of feelings encouraged  ?Discussed caregiver resources and support ?Patient Goals/Self-Care  Activities: Over the next 90 days ?Attend scheduled appts ?Utilize healthy coping skills and/or supportive resources discussed ?Select a facilities from the list provided  ?Call to check on availability and visit fa

## 2021-12-11 LAB — CULTURE, BLOOD (ROUTINE X 2)
Culture: NO GROWTH
Culture: NO GROWTH
Special Requests: ADEQUATE

## 2021-12-13 NOTE — Progress Notes (Signed)
Late entry  ? ?  ?Provided service recovery on patient. Recent notification was provided that patient may have been potentially exposed to Hepatitis B while in the [facility] ([facility]). Patient verbalized understanding. All questions have been answered. Attending MD and Infection Disease MD have been notified and will continue to follow patient.  ?  ?

## 2021-12-16 NOTE — Chronic Care Management (AMB) (Signed)
?Chronic Care Management  ? ? Clinical Social Work Note ? ?12/16/2021 ?Name: Anthony [name] MRN: [id number] DOB: 23-Nov-1940 ? ?Anthony [name] is a 81 y.o. year old male who is a primary care patient of Isaac Bliss, Rayford Halsted, MD. The CCM team was consulted to assist the patient with chronic disease management and/or care coordination needs related to: Caregiver Stress.  ? ?Engaged with patient's spouse by telephone for follow up visit in response to provider referral for social work chronic care management and care coordination services.  ? ?Consent to Services:  ?The patient was given information about Chronic Care Management services, agreed to services, and gave verbal consent prior to initiation of services.  Please see initial visit note for detailed documentation.  ? ?Patient agreed to services and consent obtained.  ? ?Assessment: Review of patient past medical history, allergies, medications, and health status, including review of relevant consultants reports was performed today as part of a comprehensive evaluation and provision of chronic care management and care coordination services.    ? ?SDOH (Social Determinants of Health) assessments and interventions performed:   ? ?Advanced Directives Status: Not addressed in this encounter. ? ?CCM Care Plan ? ?No Known Allergies ? ?Outpatient Encounter Medications as of 11/22/2021  ?Medication Sig Note  ? albuterol (PROVENTIL HFA) 108 (90 Base) MCG/ACT inhaler Inhale 2 puffs into the lungs every 4 (four) hours as needed for wheezing or shortness of breath.   ? aspirin EC 81 MG tablet Take 81 mg by mouth every evening. (1700) Swallow whole.   ? atorvastatin (LIPITOR) 20 MG tablet TAKE 1 TABLET BY MOUTH EVERYDAY AT BEDTIME (Patient taking differently: Take 20 mg by mouth at bedtime.)   ? budesonide-formoterol (SYMBICORT) 160-4.5 MCG/ACT inhaler Inhale 2 puffs into the lungs in the morning.   ? calcium acetate (PHOSLO) 667 MG capsule Take 667 mg by mouth 3 (three)  times daily after meals.   ? gabapentin (NEURONTIN) 100 MG capsule Take 2 capsules (200 mg total) by mouth at bedtime.   ? isoniazid (NYDRAZID) 300 MG tablet TAKE 1 TABLET BY MOUTH EVERY DAY (Patient taking differently: Take 300 mg by mouth daily.)   ? memantine (NAMENDA) 5 MG tablet Take 5 mg by mouth at bedtime. (2100)   ? nicotine (NICODERM CQ - DOSED IN MG/24 HOURS) 14 mg/24hr patch Place 1 patch (14 mg total) onto the skin daily.   ? oxyCODONE-acetaminophen (PERCOCET) 5-325 MG tablet Take 1 tablet by mouth every 6 (six) hours as needed. (Patient taking differently: Take 1 tablet by mouth every 6 (six) hours as needed for severe pain.)   ? pantoprazole (PROTONIX) 40 MG tablet Take 1 tablet (40 mg total) by mouth daily.   ? pyridOXINE (VITAMIN B-6) 50 MG tablet Take 1 tablet (50 mg total) by mouth daily.   ? Tiotropium Bromide Monohydrate (SPIRIVA RESPIMAT) 2.5 MCG/ACT AERS Inhale 2 puffs into the lungs daily.   ? traZODone (DESYREL) 50 MG tablet Take 1-2 tablets (50-100 mg total) by mouth at bedtime as needed. (Patient taking differently: Take 50-100 mg by mouth at bedtime as needed for sleep.)   ? [DISCONTINUED] carboxymethylcellulose (REFRESH PLUS) 0.5 % SOLN Place 1 drop into both eyes daily as needed (dry eyes).   ? [DISCONTINUED] carvedilol (COREG) 6.25 MG tablet Take 1 tablet (6.25 mg total) by mouth 2 (two) times daily with a meal. 11/22/2021: (0900 & 2100)  ? [DISCONTINUED] gabapentin (NEURONTIN) 100 MG capsule Take 100 mg by mouth at bedtime. (2100)   ? [  EXPIRED] albumin human 5 % solution 12.5 g    ? [EXPIRED] albumin human 5 % solution 12.5 g    ? [EXPIRED] albumin human 5 % solution    ? [EXPIRED] albumin human 5 % solution    ? [EXPIRED] alteplase (CATHFLO ACTIVASE) injection 4 mg    ? [EXPIRED] calcium gluconate 1 g/ 50 mL sodium chloride IVPB    ? [EXPIRED] carvedilol (COREG) tablet 6.25 mg    ? [EXPIRED] ceFAZolin (ANCEF) IVPB 2g/100 mL premix    ? [EXPIRED] chlorhexidine (PERIDEX) 0.12 % solution  15 mL    ? [EXPIRED] dextrose 50 % solution 25 mL    ? [EXPIRED] dextrose 50 % solution 50 mL    ? [EXPIRED] dextrose 50 % solution 50 mL    ? [EXPIRED] dextrose 50 % solution    ? [EXPIRED] dextrose 50 % solution    ? [EXPIRED] dextrose 50 % solution    ? [EXPIRED] DOPamine (INTROPIN) 3.2-5 MG/ML-% infusion    ? [EXPIRED] fentaNYL (SUBLIMAZE) injection 25 mcg    ? [EXPIRED] lactated ringers bolus 250 mL    ? [EXPIRED] MEDLINE mouth rinse    ? [EXPIRED] oxyCODONE (Oxy IR/ROXICODONE) immediate release tablet 2.5 mg    ? [EXPIRED] potassium chloride 10 mEq in 100 mL IVPB    ? [EXPIRED] potassium chloride 10 mEq in 100 mL IVPB    ? [EXPIRED] predniSONE (DELTASONE) tablet 40 mg    ? [DISCONTINUED] 0.9 %  sodium chloride infusion    ? [DISCONTINUED] 0.9 %  sodium chloride infusion    ? [DISCONTINUED] 0.9 %  sodium chloride infusion    ? [DISCONTINUED] 0.9 % irrigation (POUR BTL)    ? [DISCONTINUED] acetaminophen (OFIRMEV) IV 1,000 mg    ? [DISCONTINUED] acetaminophen (TYLENOL) tablet 650 mg    ? [DISCONTINUED] atorvastatin (LIPITOR) tablet 20 mg    ? [DISCONTINUED] chlorhexidine (HIBICLENS) 4 % liquid 4 application.    ? [DISCONTINUED] chlorhexidine (HIBICLENS) 4 % liquid 4 application.    ? [DISCONTINUED] Chlorhexidine Gluconate Cloth 2 % PADS 6 each    ? [DISCONTINUED] Chlorhexidine Gluconate Cloth 2 % PADS 6 each    ? [DISCONTINUED] dextrose 10 % infusion    ? [DISCONTINUED] dextrose 5 % solution    ? [DISCONTINUED] dextrose 50 % solution 50 mL    ? [DISCONTINUED] docusate sodium (COLACE) capsule 100 mg    ? [DISCONTINUED] DOPamine (INTROPIN) 800 mg in dextrose 5 % 250 mL (3.2 mg/mL) infusion    ? [DISCONTINUED] fentaNYL (SUBLIMAZE) injection 25-50 mcg    ? [DISCONTINUED] fentaNYL (SUBLIMAZE) injection    ? [DISCONTINUED] gabapentin (NEURONTIN) capsule 100 mg    ? [DISCONTINUED] gabapentin (NEURONTIN) capsule 200 mg    ? [DISCONTINUED] Heparin (Porcine) in NaCl 1000-0.9 UT/500ML-% SOLN    ? [DISCONTINUED] heparin 6000  units / NS 500 mL irrigation    ? [DISCONTINUED] heparin injection 1,500 Units    ? [DISCONTINUED] heparin injection 2,000 Units    ? [DISCONTINUED] heparin injection 5,000 Units    ? [DISCONTINUED] heparin injection 5,000 Units    ? [DISCONTINUED] hydrALAZINE (APRESOLINE) injection 5 mg    ? [DISCONTINUED] HYDROmorphone (DILAUDID) injection 0.5 mg    ? [DISCONTINUED] iodixanol (VISIPAQUE) 320 MG/ML injection    ? [DISCONTINUED] ipratropium-albuterol (DUONEB) 0.5-2.5 (3) MG/3ML nebulizer solution 3 mL    ? [DISCONTINUED] isoniazid (NYDRAZID) tablet 300 mg    ? [DISCONTINUED] labetalol (NORMODYNE) injection 10 mg    ? [DISCONTINUED] lidocaine (PF) (XYLOCAINE) 1 %  injection    ? [DISCONTINUED] lidocaine-EPINEPHrine (PF) (XYLOCAINE-EPINEPHrine) 1 %-1:200000 (PF) injection    ? [DISCONTINUED] midazolam (VERSED) 2 MG/2ML injection    ? [DISCONTINUED] midazolam (VERSED) injection    ? [DISCONTINUED] mometasone-formoterol (DULERA) 200-5 MCG/ACT inhaler 2 puff    ? [DISCONTINUED] ondansetron (ZOFRAN) injection 4 mg    ? [DISCONTINUED] oxyCODONE-acetaminophen (PERCOCET/ROXICET) 5-325 MG per tablet 1 tablet    ? [DISCONTINUED] polyethylene glycol (MIRALAX / GLYCOLAX) packet 17 g    ? [DISCONTINUED] pyridOXINE (VITAMIN B-6) tablet 50 mg    ? [DISCONTINUED] sodium chloride flush (NS) 0.9 % injection 3 mL    ? [DISCONTINUED] sodium chloride flush (NS) 0.9 % injection 3 mL    ? [DISCONTINUED] Tiotropium Bromide Monohydrate AERS 2 puff    ? [DISCONTINUED] umeclidinium bromide (INCRUSE ELLIPTA) 62.5 MCG/ACT 1 puff    ? ?No facility-administered encounter medications on file as of 11/22/2021.  ? ? ?Patient Active Problem List  ? Diagnosis Date Noted  ? Acute respiratory failure with hypoxia ([place]) 12/02/2021  ? PAD (peripheral artery disease) ([place]) 12/01/2021  ? Sepsis ([place]) 11/26/2021  ? Acute encephalopathy 11/21/2021  ? Edema of left upper arm 11/24/2021  ? Foot pain, bilateral 11/28/2021  ? Pressure injury of skin 11/26/2021  ?  Encephalopathy acute   ? Hyperphosphatemia 11/02/2021  ? Dyspnea 11/01/2021  ? Leg pain 11/01/2021  ? Positive QuantiFERON-TB Gold test 05/16/2021  ? TB lung, latent 05/16/2021  ? Mitral regurgitation 08/2

## 2021-12-16 NOTE — Patient Instructions (Signed)
Visit Information ? ?Thank you for taking time to visit with me today. Please don't hesitate to contact me if I can be of assistance to you before our next scheduled telephone appointment. ? ?Following are the goals we discussed today:  ?Attend all scheduled medical appointments ?Utilize healthy coping skills and/or supportive resources discussed ?Contact PCP office with any questions or concerns ? ?Please call the care guide team at [phone] if you need to cancel or reschedule your appointment.  ? ?If you are experiencing a Mental Health or [place] or need someone to talk to, please call [phone]  ? ?The patient verbalized understanding of instructions, educational materials, and care plan provided today and declined offer to receive copy of patient instructions, educational materials, and care plan.  ? ?Christa See, MSW, LCSW ?[facility] ?[facility] ?[email or web address] ?Phone [phone] ?6:24 AM ? ?

## 2021-12-19 ENCOUNTER — Encounter: Payer: Medicare Other | Admitting: Vascular Surgery

## 2021-12-20 DIAGNOSIS — I2510 Atherosclerotic heart disease of native coronary artery without angina pectoris: Secondary | ICD-10-CM

## 2021-12-20 DIAGNOSIS — J449 Chronic obstructive pulmonary disease, unspecified: Secondary | ICD-10-CM

## 2021-12-20 DIAGNOSIS — F0390 Unspecified dementia without behavioral disturbance: Secondary | ICD-10-CM

## 2021-12-20 DIAGNOSIS — I5032 Chronic diastolic (congestive) heart failure: Secondary | ICD-10-CM

## 2021-12-21 NOTE — Progress Notes (Signed)
Time of death 2222 on 12/07/2021. [place] Crane, PA confirmed time of death by auscultating no heart sounds, no breath sounds, and no palpable pulse. Family declined patient's belongings which included sweat pants and a long sleeve shirt. Family given blue folder before leaving.  ? ?Elisabeth Most, RN  ?

## 2021-12-21 NOTE — Progress Notes (Signed)
RT Note:  Patient removed from BIPAP. Patient not triggering any breaths on NIV. RN & CCM at bedside to inform family of patient heart stopped. ?

## 2021-12-21 NOTE — Death Summary Note (Signed)
?DEATH SUMMARY  ? ?Patient Details  ?Name: Yon Schiffman ?MRN: [id number] ?DOB: 09-Jul-1941 ? ?Admission/Discharge Information  ? ?Admit Date:  11/20/2021  ?Date of Death: Date of Death: 2021-12-11  ?Time of Death: Time of Death: December 09, 2220  ?Length of Stay: 3  ?Referring Physician: Isaac Bliss, Rayford Halsted, MD  ? ?Reason(s) for Hospitalization  ?Acute respiratory failure with hypoxia ? ?Diagnoses  ?Preliminary cause of death:  ?Secondary Diagnoses (including complications and co-morbidities):  ?Principal Problem: ?  Acute respiratory failure with hypoxia ([place]) ?Active Problems: ?  ESRD (end stage renal disease) on dialysis [facility]) ?  HTN (hypertension) ?  Acute on chronic anemia ?  Elevated troponin with hx of CAD  ?  Chronic diastolic heart failure ([place]) ?  PAF (paroxysmal atrial fibrillation) ([place]) ?  COPD (chronic obstructive pulmonary disease) ([place]) ?  CAD (coronary artery disease) ?  HLD (hyperlipidemia) ?  Dementia without behavioral disturbance ([place]) ?  TB lung, latent ?  PAD (peripheral artery disease) ([place]) ?  Sepsis ([place]) ?  Acute encephalopathy ?  Edema of left upper arm ?Acute on chronic leg ischemia, bilateral ? 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  Also some reported worsening mental status from his baseline, in which is not not very talkative.  He is wheelchair bound.  ?  ?Of note, recent RUE AVG placement on 3/7 in which he required ICU stay after becoming bradycardic, hypotensive, and hypoglycemic; also noted to have critical limb lower ischemia c/w with severe PAD with recommendations for bilateral LE amputations if pain  not controlled.  He was discharged back to SNF on 3/11.   Currently using R TDC for access. ?  ?He was admitted and placed on broad spectrum abx- vanc/ cefepime/ flagyl.  CT head non acute.  Blood pressures had been stable and he was able to be taken off BiPAP this morning and was down to 2L [place] prior to going to iHD.  Reported UF ~1L but given 278ml back and returned on NRB in respiratory distress therefore placed back on BiPAP.  Blood pressures have been difficult to obtain/ inaccurate but last reported one 93/32, and finger CBG of 10, however patient is alert and able to follow commands on BiPAP.  CXR and ABG pending.  PCCM called for ICU assessment.  He was transferred to the intensive care unit for respiratory support.  He was treated with broad-spectrum antibiotics, BiPAP, supplemental oxygen.  He developed shock in the ICU requiring vasopressors to be used intermittently.  Unfortunately he developed refractory critical leg ischemia due to PAD and metabolic acidosis refractory to continuous renal replacement therapy.  Due to his poor prognosis related to his pulmonary artery disease, patient and his family elected to pursue comfort focused care.  He expired with family at bedside. ? ? ? ?Pertinent Labs and Studies  ?Significant Diagnostic Studies ?CT HEAD WO CONTRAST (5MM) ? ?Result Date: 12/07/2021 ?CLINICAL DATA:  81 year old male altered mental status. EXAM: CT HEAD WITHOUT CONTRAST TECHNIQUE: Contiguous axial images were obtained from the base of the skull through the vertex without intravenous contrast. RADIATION DOSE REDUCTION: This exam was performed according to the departmental  dose-optimization program which includes automated exposure control, adjustment of the mA and/or kV according to patient size and/or use of iterative reconstruction technique. COMPARISON:  Head CT 10/12/2021. Brain MRI 09/30/2021. FINDINGS: Brain: Chronic small vessel disease in the right anterior frontal lobe tracking into the  right basal ganglia and anterior deep white matter capsules appears stable since January. Bilateral basal ganglia calcified atherosclerosis. Stable gray-white matter differentiation elsewhere. Tiny chronic right cerebellar infarct. No superimposed midline shift, ventriculomegaly, mass effect, evidence of mass lesion, intracranial hemorrhage or evidence of cortically based acute infarction. Vascular: Calcified atherosclerosis at the skull base. No suspicious intracranial vascular hyperdensity. Skull: No acute osseous abnormality identified. Sequelae of left orbital floor fracture. Sinuses/Orbits: Improved left maxillary and ethmoid sinus aeration following left orbital floor fracture in January. Other Visualized paranasal sinuses and mastoids are stable and well aerated. Other: Largely resolved left forehead scalp hematoma since January. No acute orbit or scalp soft tissue finding. IMPRESSION: 1. No acute intracranial abnormality. Chronic small vessel disease appears stable by CT. 2. Left orbital floor fracture and left forehead scalp hematoma from January appear improved. Electronically Signed   By: Genevie Ann M.D.   On: 12/07/2021 06:18  ? ?PERIPHERAL VASCULAR CATHETERIZATION ? ?Result Date: 11/29/2021 ?DATE OF SERVICE: 11/29/2021  PATIENT:  [name]  81 y.o. male  PRE-OPERATIVE DIAGNOSIS:  Atherosclerosis of native arteries of bilateral lower extremities causing ischemic rest pain  POST-OPERATIVE DIAGNOSIS:  Same  PROCEDURE:  1) US guided right common femoral artery access 2) Aortogram 3) Left lower extremity angiogram with second order cannulation 4) Right lower extremity angiogram (136mL total contrast) 5) Conscious sedation (38 minutes)  SURGEON:  Yevonne Aline. Stanford Breed, MD  ASSISTANT: none  ANESTHESIA:   local and IV sedation  ESTIMATED BLOOD LOSS: minimal  LOCAL MEDICATIONS USED:  LIDOCAINE  COUNTS: confirmed correct.  PATIENT DISPOSITION:  PACU - hemodynamically stable.  Delay start of Pharmacological VTE agent  (>24hrs) due to surgical blood loss or risk of bleeding: no  INDICATION FOR PROCEDURE: Donaven Criswell is a 81 y.o. male with ischemic rest pain of bilateral lower extremities with physical exam consistent with severe peripheral arterial disease. After careful discussion of risks, benefits, and alternatives the patient was offered angiography. The patient understood and wished to proceed.  OPERATIVE FINDINGS: Terminal aorta and iliac arteries: Tortuous, heavily diseased, but widely patent  Right lower extremity: Common femoral artery: heavily diseased, but widely patent Profunda femoris artery: heavily diseased, but widely patent Superficial femoral artery: heavily diseased, but widely patent until Hunter's canal where the vessel occludes. It reconstitutes less than 2 cm distal to the occlusion in the above knee popliteal artery Popliteal artery: heavily diseased, but widely patent Anterior tibial artery: occluded Tibioperoneal trunk: heavily diseased, but widely patent Peroneal artery: occluded Posterior tibial artery: occluded Pedal circulation: no flow visualized.  Left lower extremity: Common femoral artery: heavily diseased, but widely patent Profunda femoris artery: heavily diseased, but widely patent Superficial femoral artery: heavily diseased, but widely patent Popliteal artery: heavily diseased, but widely patent Anterior tibial artery: occluded Tibioperoneal trunk: heavily diseased, but widely patent Peroneal artery: occluded Posterior tibial artery: occluded Pedal circulation: no flow visualized.  DESCRIPTION OF PROCEDURE: After identification of the patient in the pre-operative holding area, the patient was transferred to the operating room. The patient was positioned supine on the operating room table. Anesthesia was induced. The groins was prepped and draped in standard fashion. A surgical pause was performed confirming correct patient, procedure, and operative location.  The right groin was  anesthetized with subcutaneous injection of 1% lidocaine. Using ultrasound guidance, the right common femoral artery was accessed with micropuncture technique. Fluoroscopy was used to confirm cannulation over the f

## 2021-12-21 DEATH — deceased

## 2021-12-26 ENCOUNTER — Encounter: Payer: BLUE CROSS/BLUE SHIELD | Admitting: Vascular Surgery

## 2021-12-26 ENCOUNTER — Encounter (HOSPITAL_COMMUNITY): Payer: BLUE CROSS/BLUE SHIELD

## 2022-01-28 ENCOUNTER — Ambulatory Visit: Payer: Medicare Other | Admitting: Podiatry

## 2023-03-03 IMAGING — DX DG CHEST 2V
2 series · 2 of 2 positions shown · non-contrast
Comparison: Chest x-ray 08/17/2021

CLINICAL DATA: Shortness of breath

EXAM:
CHEST - 2 VIEW

[chest lat]
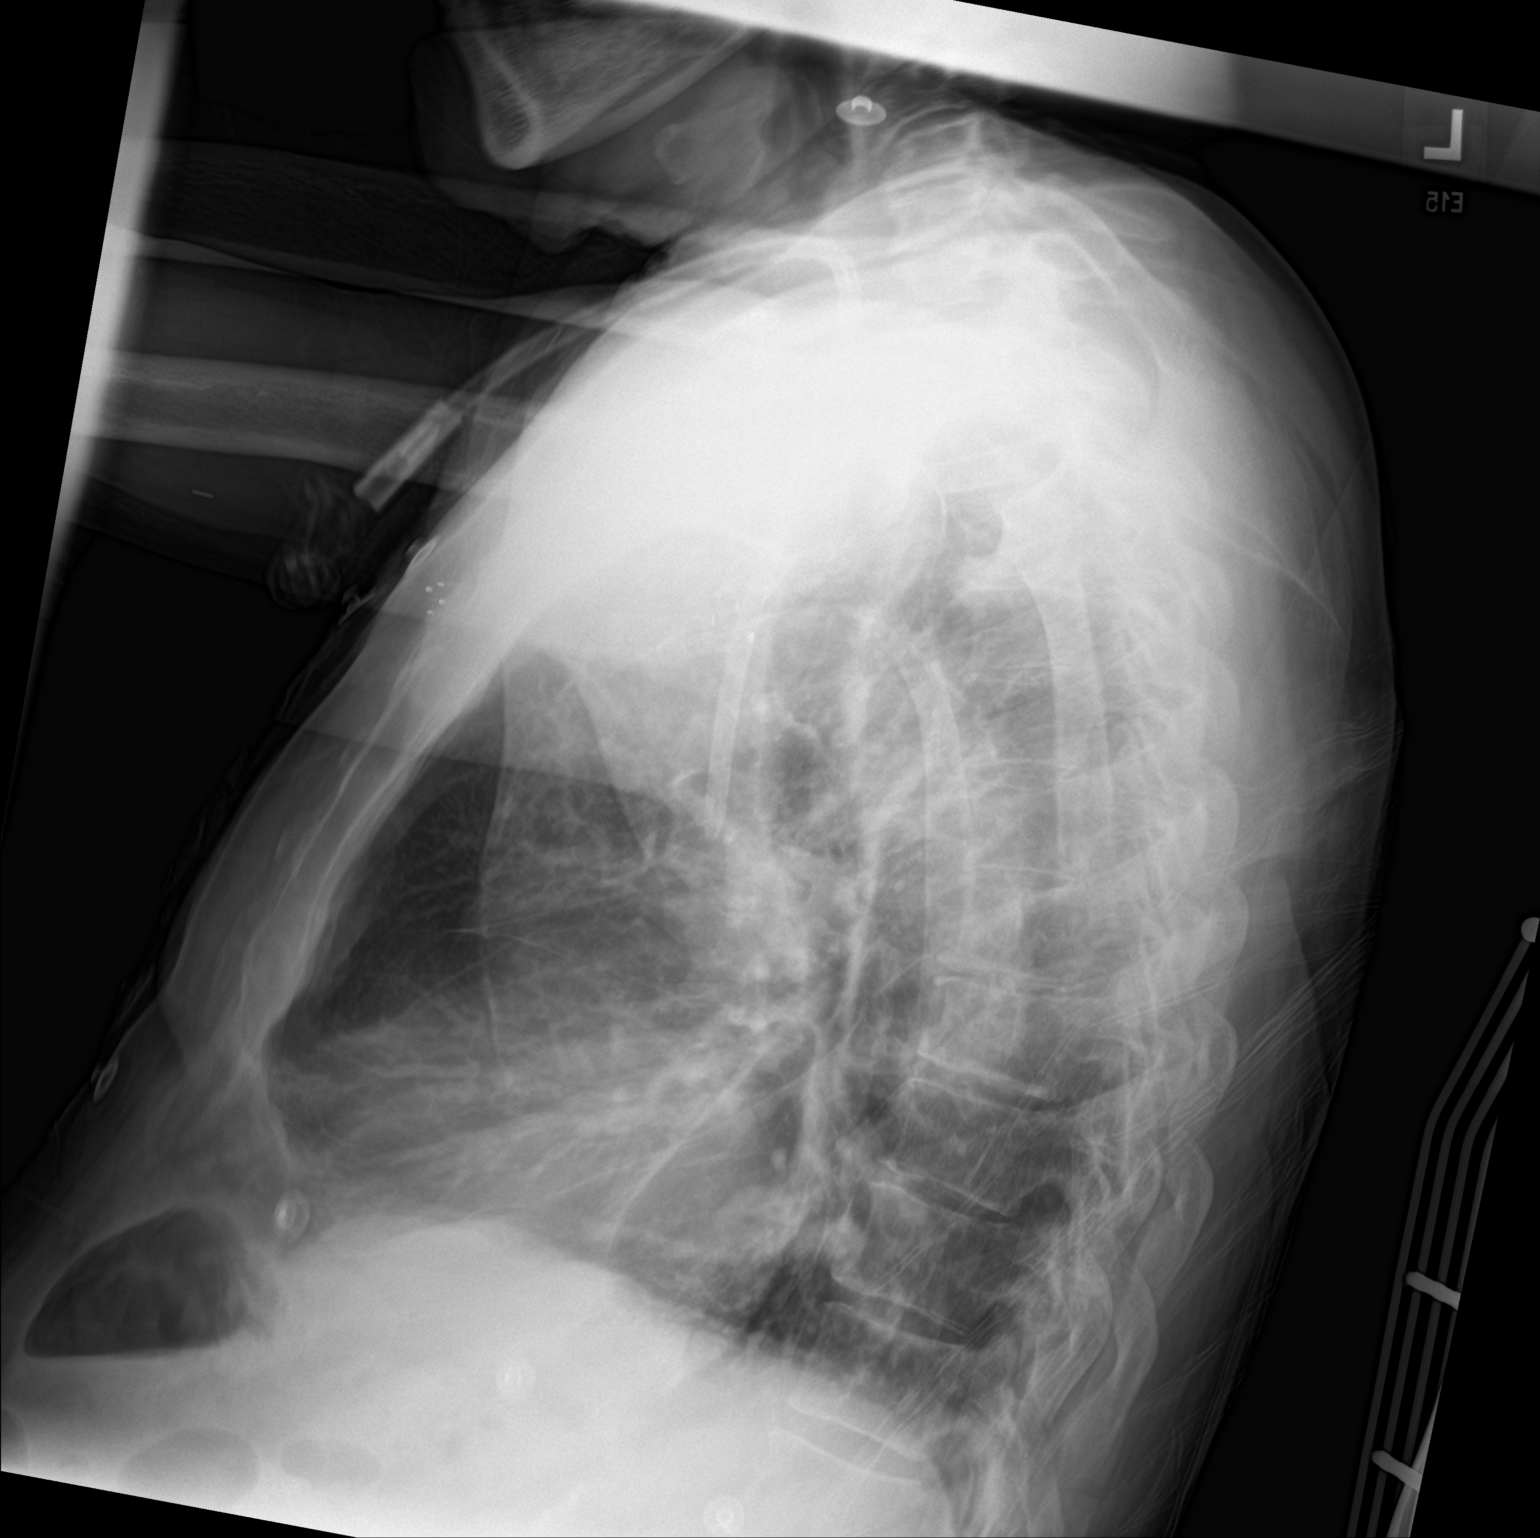

[chest ap]
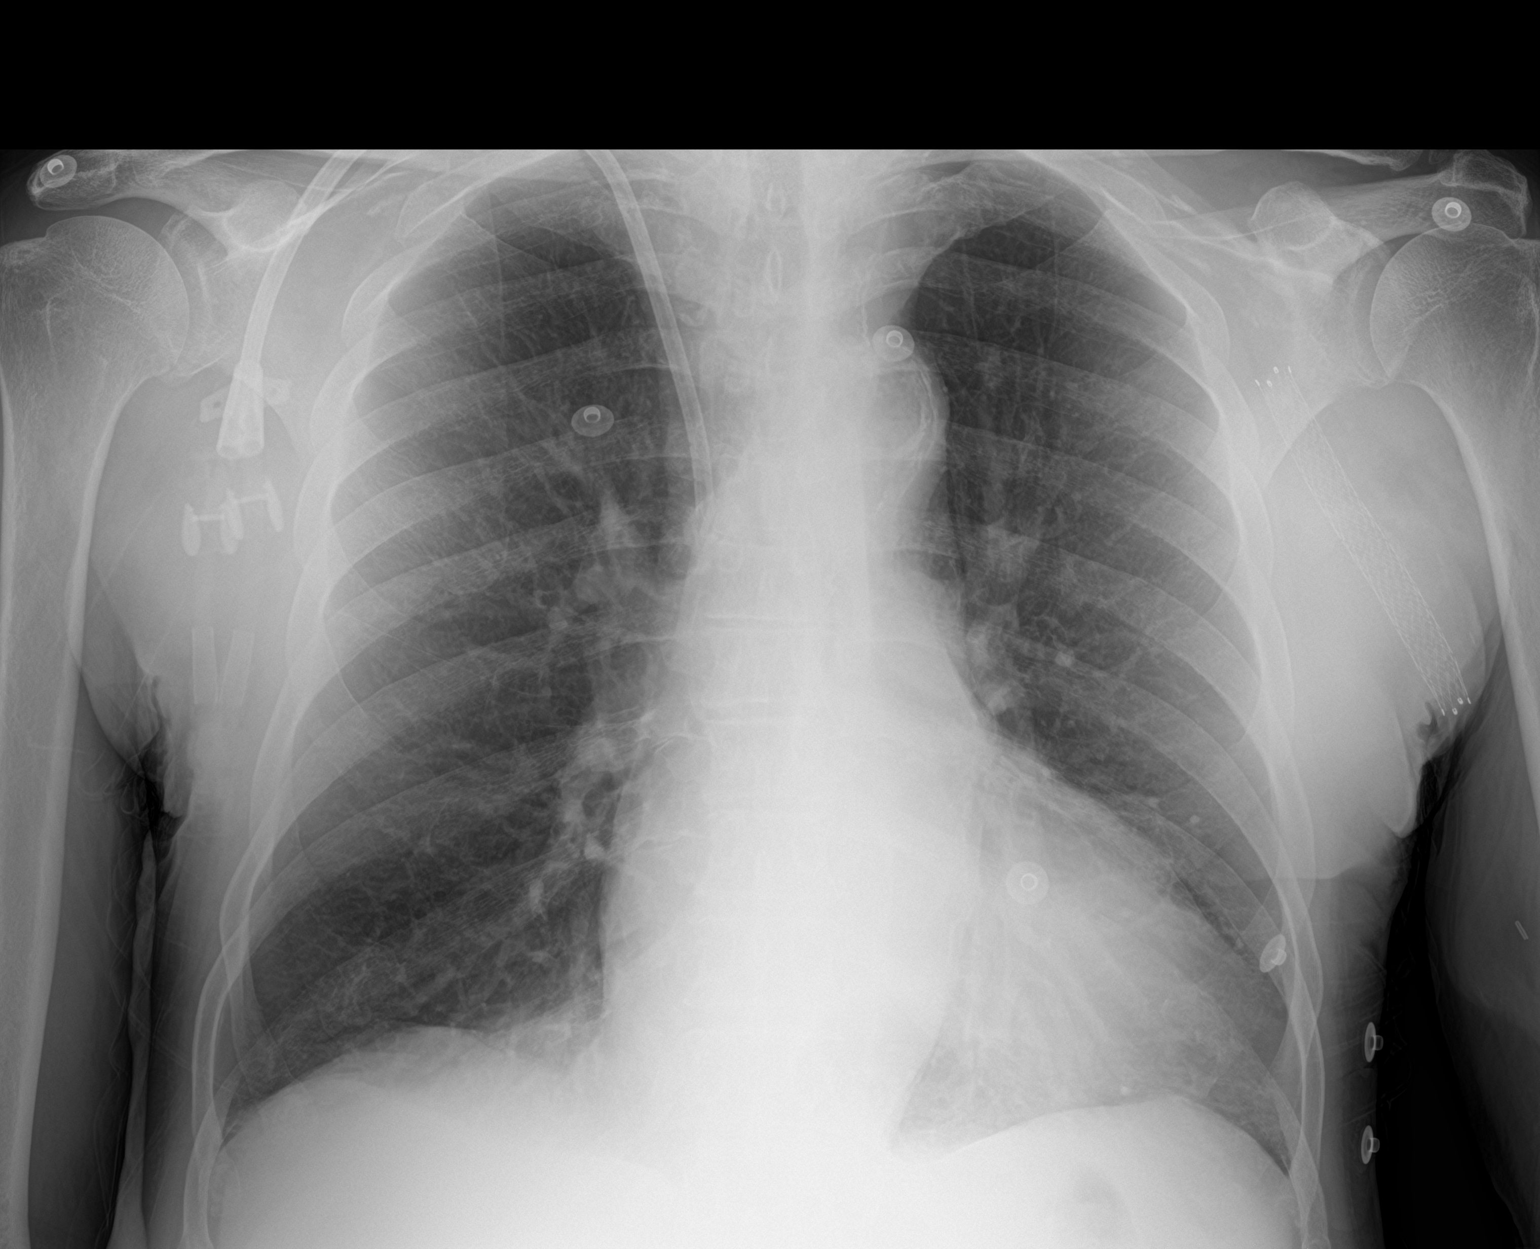

[2 of 2 positions shown; findings below may reference images not displayed]

FINDINGS: Heart is mildly enlarged. Mediastinum appears stable. Calcified
plaques in the aortic arch. Right-sided central line is stable.
Pulmonary vasculature is normal. No focal consolidation identified.
No pleural effusion or pneumothorax.
IMPRESSION: Mild cardiomegaly with no acute process identified.

## 2023-03-20 IMAGING — CR DG CHEST 2V
2 series · 2 of 2 positions shown · non-contrast
Comparison: 08/23/2021

CLINICAL DATA: Chest pain

EXAM:
CHEST - 2 VIEW

[chest pa]
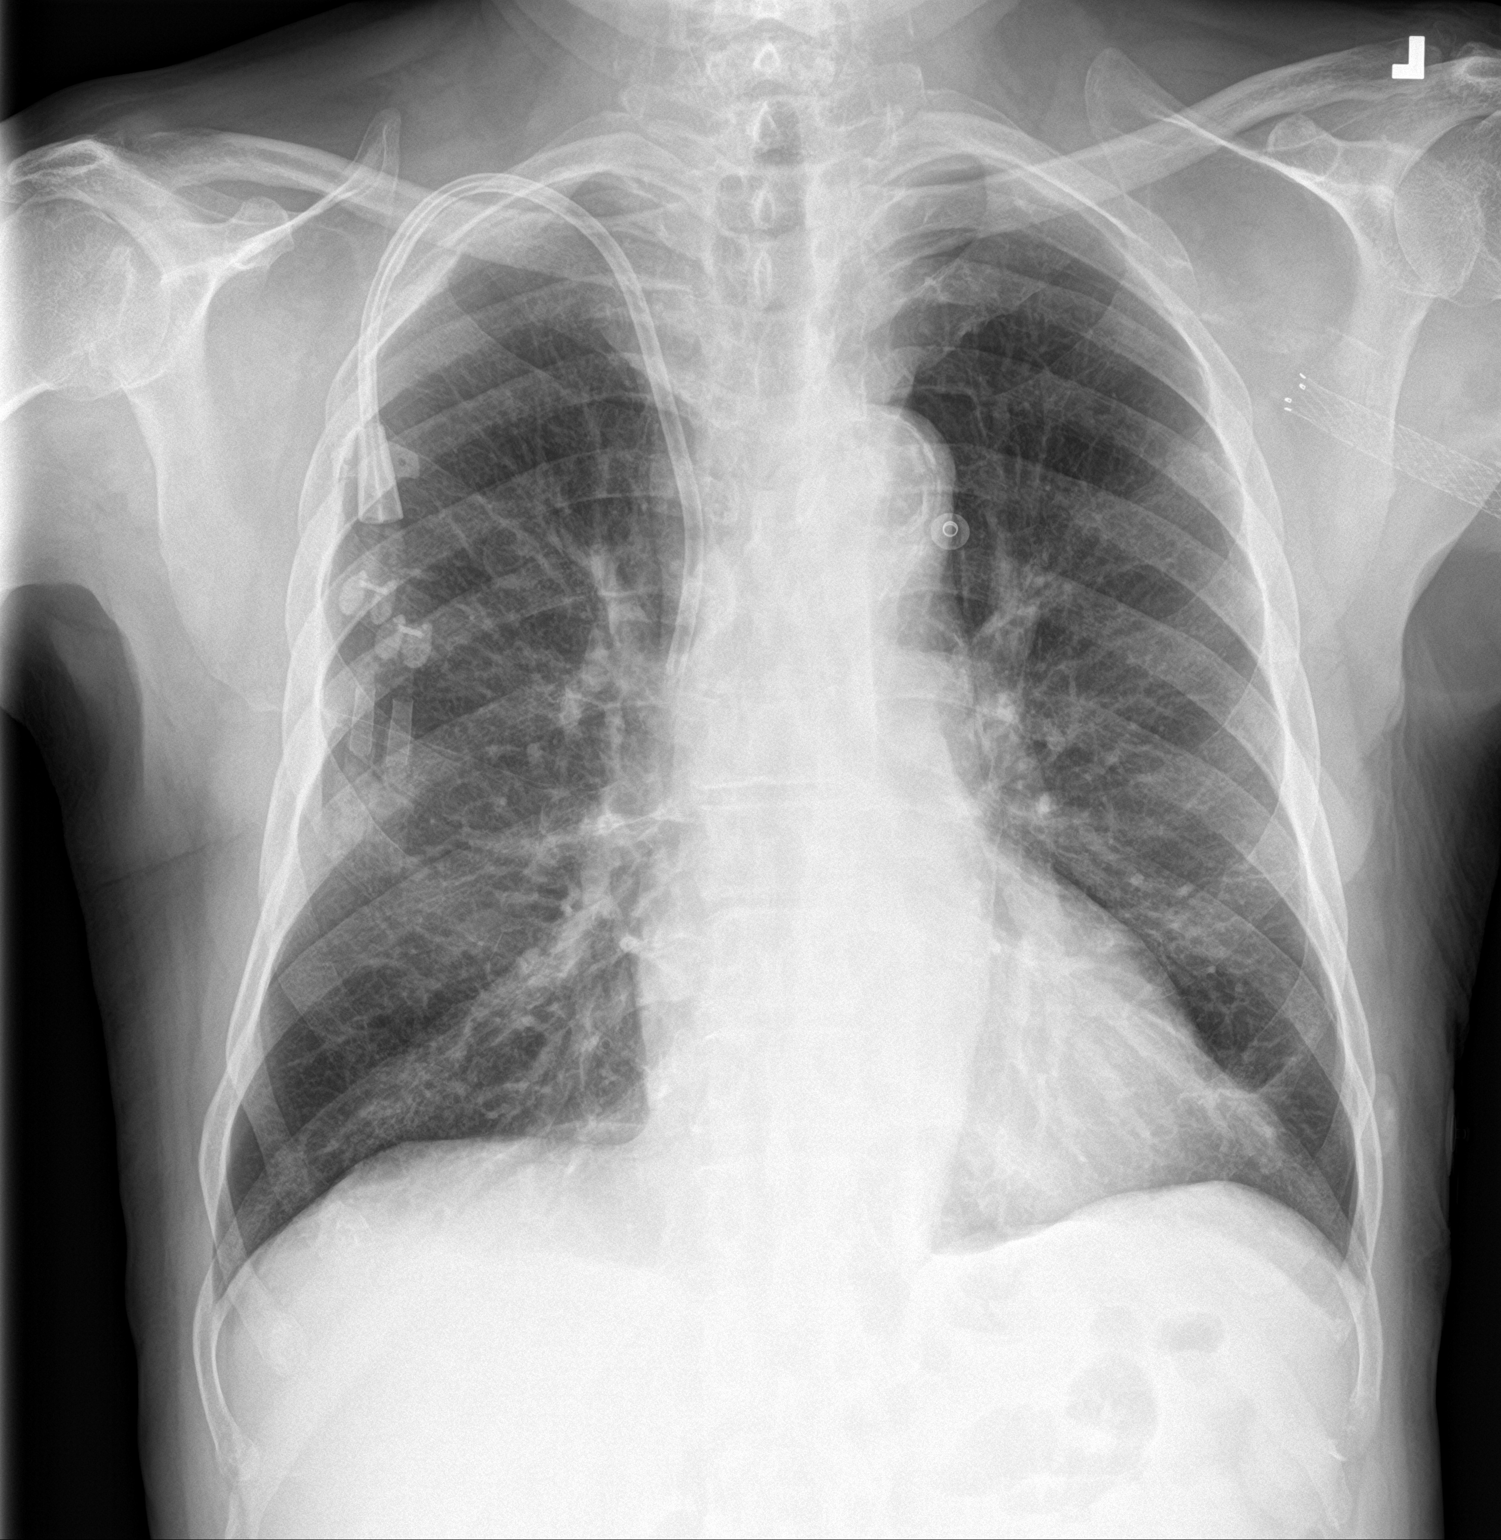

[chest lat]
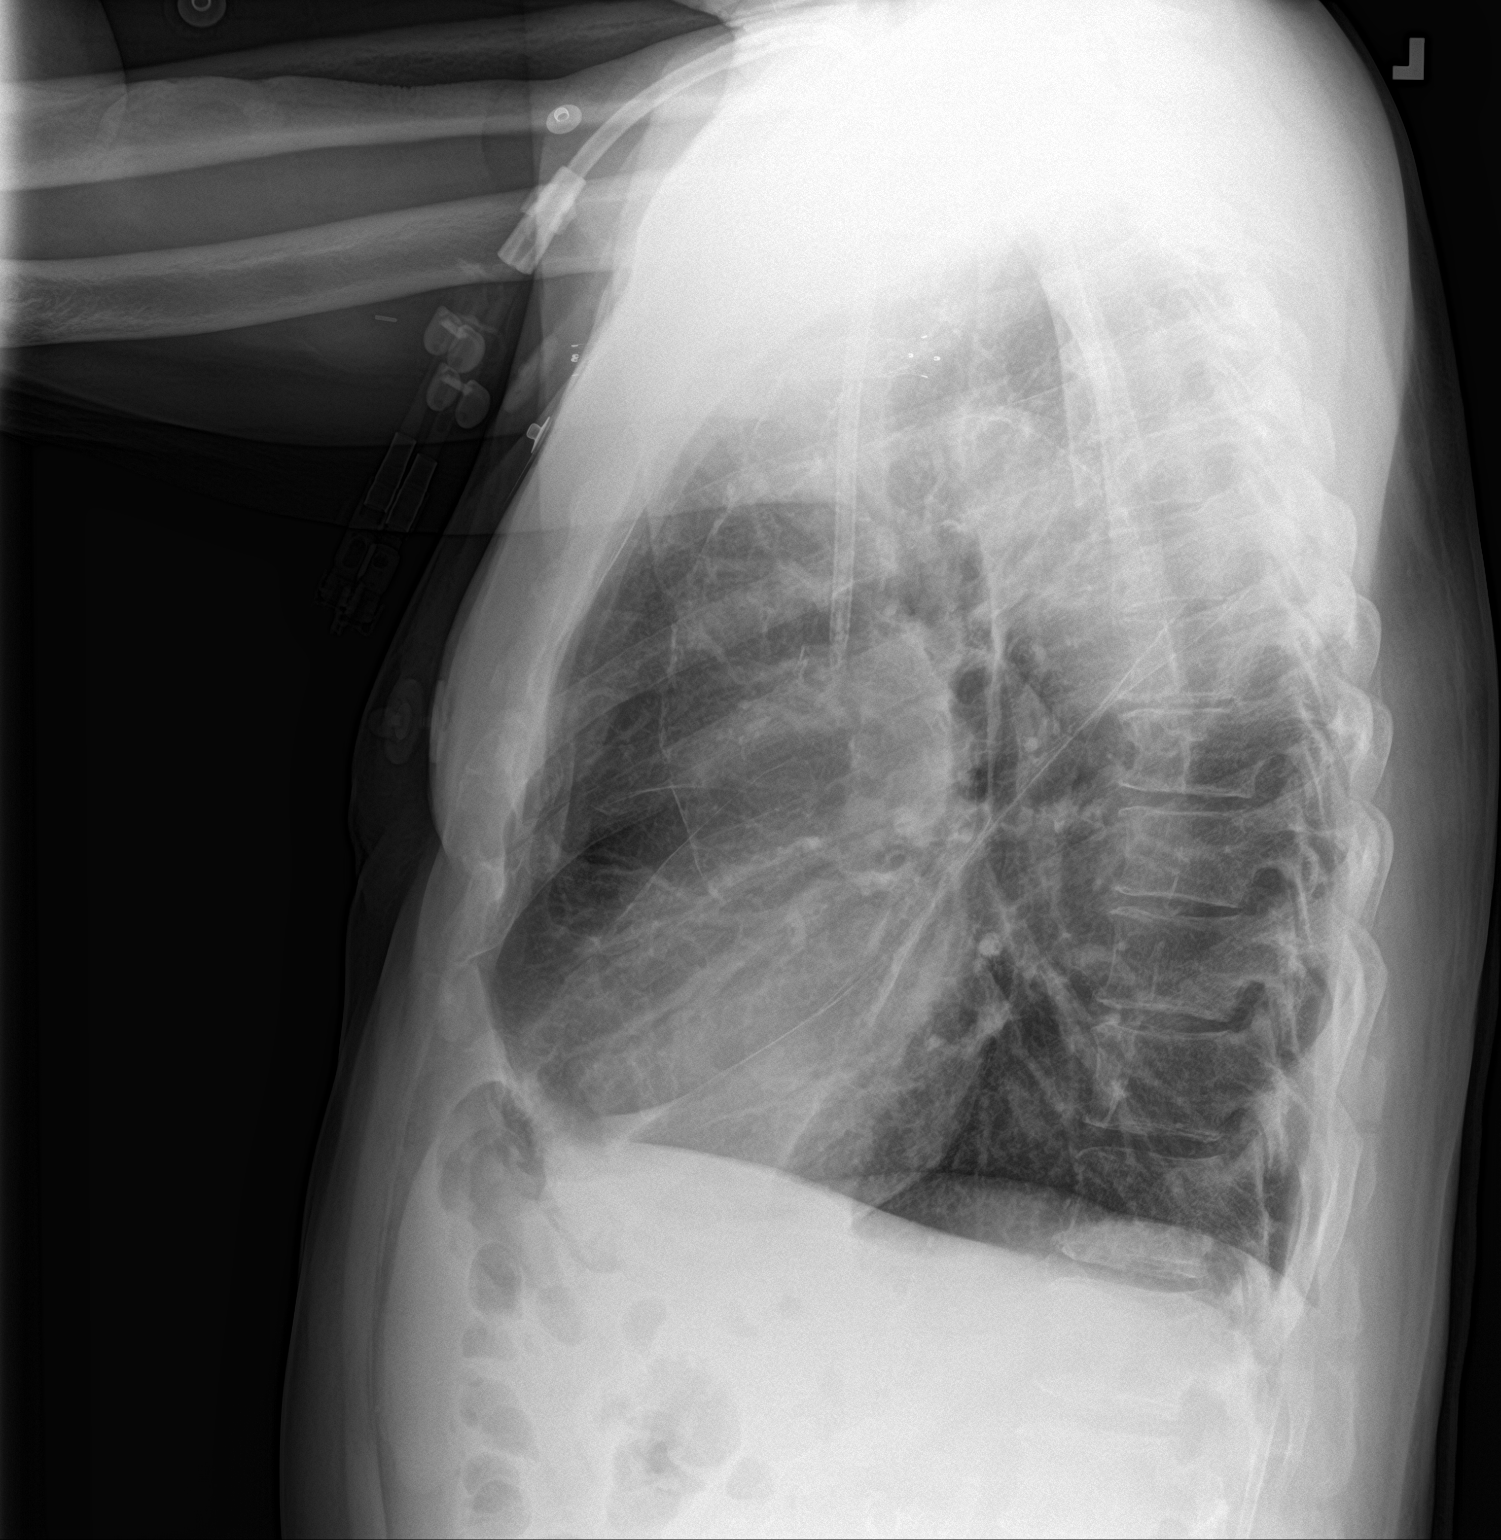

[2 of 2 positions shown; findings below may reference images not displayed]

FINDINGS: Right CVC in unchanged position. Cardiac and mediastinal contours
are within normal limits. Aortic atherosclerosis. No focal pulmonary
opacity. No pleural effusion or pneumothorax. No acute osseous
abnormality.
IMPRESSION: No acute cardiopulmonary process.
